# Patient Record
Sex: Male | Born: 1950 | Race: Black or African American | Hispanic: No | Smoking: Current every day smoker
Health system: Southern US, Community
[De-identification: ages and names within clinical notes are randomized; demographics above are authoritative.]

## PROBLEM LIST (undated history)

## (undated) DIAGNOSIS — K2270 Barrett's esophagus without dysplasia: Secondary | ICD-10-CM

## (undated) DIAGNOSIS — I714 Abdominal aortic aneurysm, without rupture, unspecified: Secondary | ICD-10-CM

## (undated) DIAGNOSIS — I739 Peripheral vascular disease, unspecified: Secondary | ICD-10-CM

## (undated) DIAGNOSIS — K259 Gastric ulcer, unspecified as acute or chronic, without hemorrhage or perforation: Secondary | ICD-10-CM

## (undated) HISTORY — DX: Barrett's esophagus without dysplasia: K22.70

## (undated) HISTORY — DX: Abdominal aortic aneurysm, without rupture, unspecified: I71.40

## (undated) HISTORY — DX: Gastric ulcer, unspecified as acute or chronic, without hemorrhage or perforation: K25.9

## (undated) HISTORY — DX: Peripheral vascular disease, unspecified: I73.9

---

## 1998-03-13 ENCOUNTER — Encounter: Payer: Self-pay | Admitting: Emergency Medicine

## 1998-03-13 ENCOUNTER — Emergency Department (HOSPITAL_COMMUNITY): Admission: EM | Admit: 1998-03-13 | Discharge: 1998-03-13 | Payer: Self-pay | Admitting: Emergency Medicine

## 1998-03-15 ENCOUNTER — Inpatient Hospital Stay (HOSPITAL_COMMUNITY): Admission: EM | Admit: 1998-03-15 | Discharge: 1998-03-20 | Payer: Self-pay | Admitting: Emergency Medicine

## 2000-08-23 ENCOUNTER — Inpatient Hospital Stay (HOSPITAL_COMMUNITY): Admission: EM | Admit: 2000-08-23 | Discharge: 2000-08-27 | Payer: Self-pay | Admitting: Emergency Medicine

## 2000-08-23 ENCOUNTER — Encounter: Payer: Self-pay | Admitting: Sports Medicine

## 2000-09-04 ENCOUNTER — Encounter: Admission: RE | Admit: 2000-09-04 | Discharge: 2000-09-04 | Payer: Self-pay | Admitting: Sports Medicine

## 2000-09-06 ENCOUNTER — Encounter: Payer: Self-pay | Admitting: Family Medicine

## 2000-09-07 ENCOUNTER — Inpatient Hospital Stay (HOSPITAL_COMMUNITY): Admission: EM | Admit: 2000-09-07 | Discharge: 2000-09-09 | Payer: Self-pay | Admitting: Emergency Medicine

## 2001-03-21 ENCOUNTER — Encounter: Payer: Self-pay | Admitting: Emergency Medicine

## 2001-03-21 ENCOUNTER — Emergency Department (HOSPITAL_COMMUNITY): Admission: EM | Admit: 2001-03-21 | Discharge: 2001-03-21 | Payer: Self-pay | Admitting: Emergency Medicine

## 2001-03-25 ENCOUNTER — Inpatient Hospital Stay (HOSPITAL_COMMUNITY): Admission: EM | Admit: 2001-03-25 | Discharge: 2001-03-29 | Payer: Self-pay | Admitting: Emergency Medicine

## 2001-05-06 ENCOUNTER — Emergency Department (HOSPITAL_COMMUNITY): Admission: EM | Admit: 2001-05-06 | Discharge: 2001-05-06 | Payer: Self-pay | Admitting: Emergency Medicine

## 2001-05-08 ENCOUNTER — Emergency Department (HOSPITAL_COMMUNITY): Admission: EM | Admit: 2001-05-08 | Discharge: 2001-05-08 | Payer: Self-pay | Admitting: Emergency Medicine

## 2020-08-08 ENCOUNTER — Other Ambulatory Visit: Payer: Self-pay

## 2020-08-08 ENCOUNTER — Emergency Department (HOSPITAL_COMMUNITY): Payer: Medicare Other

## 2020-08-08 ENCOUNTER — Inpatient Hospital Stay (HOSPITAL_COMMUNITY)
Admission: EM | Admit: 2020-08-08 | Discharge: 2020-08-10 | DRG: 728 | Disposition: A | Discharge status: Home / Self Care | Payer: Medicare Other | Attending: Internal Medicine | Admitting: Internal Medicine

## 2020-08-08 ENCOUNTER — Encounter (HOSPITAL_COMMUNITY): Payer: Self-pay | Admitting: Emergency Medicine

## 2020-08-08 DIAGNOSIS — I714 Abdominal aortic aneurysm, without rupture, unspecified: Secondary | ICD-10-CM | POA: Diagnosis present

## 2020-08-08 DIAGNOSIS — N492 Inflammatory disorders of scrotum: Secondary | ICD-10-CM | POA: Diagnosis present

## 2020-08-08 DIAGNOSIS — Z20822 Contact with and (suspected) exposure to covid-19: Secondary | ICD-10-CM | POA: Diagnosis present

## 2020-08-08 DIAGNOSIS — K56609 Unspecified intestinal obstruction, unspecified as to partial versus complete obstruction: Secondary | ICD-10-CM

## 2020-08-08 DIAGNOSIS — I723 Aneurysm of iliac artery: Secondary | ICD-10-CM | POA: Diagnosis present

## 2020-08-08 DIAGNOSIS — N5089 Other specified disorders of the male genital organs: Secondary | ICD-10-CM | POA: Diagnosis present

## 2020-08-08 DIAGNOSIS — K4030 Unilateral inguinal hernia, with obstruction, without gangrene, not specified as recurrent: Secondary | ICD-10-CM | POA: Diagnosis present

## 2020-08-08 DIAGNOSIS — K4090 Unilateral inguinal hernia, without obstruction or gangrene, not specified as recurrent: Secondary | ICD-10-CM

## 2020-08-08 DIAGNOSIS — R109 Unspecified abdominal pain: Secondary | ICD-10-CM

## 2020-08-08 LAB — PROTIME-INR
INR: 1.1 (ref 0.8–1.2)
Prothrombin Time: 14.1 seconds (ref 11.4–15.2)

## 2020-08-08 LAB — COMPREHENSIVE METABOLIC PANEL
ALT: 17 U/L (ref 0–44)
AST: 23 U/L (ref 15–41)
Albumin: 3.8 g/dL (ref 3.5–5.0)
Alkaline Phosphatase: 61 U/L (ref 38–126)
Anion gap: 6 (ref 5–15)
BUN: 8 mg/dL (ref 8–23)
CO2: 27 mmol/L (ref 22–32)
Calcium: 9.3 mg/dL (ref 8.9–10.3)
Chloride: 105 mmol/L (ref 98–111)
Creatinine, Ser: 1.04 mg/dL (ref 0.61–1.24)
GFR, Estimated: >60 mL/min (ref >60)
Glucose, Bld: 110 mg/dL — ABNORMAL HIGH (ref 70–99)
Potassium: 3.9 mmol/L (ref 3.5–5.1)
Sodium: 138 mmol/L (ref 135–145)
Total Bilirubin: 0.4 mg/dL (ref 0.3–1.2)
Total Protein: 7.3 g/dL (ref 6.5–8.1)

## 2020-08-08 LAB — CBC WITH DIFFERENTIAL/PLATELET
Abs Immature Granulocytes: 0.02 10*3/uL (ref 0.00–0.07)
Basophils Absolute: 0 10*3/uL (ref 0.0–0.1)
Basophils Relative: 0 %
Eosinophils Absolute: 0.1 10*3/uL (ref 0.0–0.5)
Eosinophils Relative: 2 %
HCT: 39 % (ref 39.0–52.0)
Hemoglobin: 13 g/dL (ref 13.0–17.0)
Immature Granulocytes: 0 %
Lymphocytes Relative: 16 %
Lymphs Abs: 1.2 10*3/uL (ref 0.7–4.0)
MCH: 32 pg (ref 26.0–34.0)
MCHC: 33.3 g/dL (ref 30.0–36.0)
MCV: 96.1 fL (ref 80.0–100.0)
Monocytes Absolute: 0.7 10*3/uL (ref 0.1–1.0)
Monocytes Relative: 10 %
Neutro Abs: 5.2 10*3/uL (ref 1.7–7.7)
Neutrophils Relative %: 72 %
Platelets: 199 10*3/uL (ref 150–400)
RBC: 4.06 MIL/uL — ABNORMAL LOW (ref 4.22–5.81)
RDW: 14.1 % (ref 11.5–15.5)
WBC: 7.2 10*3/uL (ref 4.0–10.5)
nRBC: 0 % (ref 0.0–0.2)

## 2020-08-08 LAB — URINALYSIS, ROUTINE W REFLEX MICROSCOPIC
Bilirubin Urine: NEGATIVE
Glucose, UA: NEGATIVE mg/dL
Hgb urine dipstick: NEGATIVE
Ketones, ur: 5 mg/dL — AB
Leukocytes,Ua: NEGATIVE
Nitrite: NEGATIVE
Protein, ur: 100 mg/dL — AB
Specific Gravity, Urine: 1.025 (ref 1.005–1.030)
pH: 5 (ref 5.0–8.0)

## 2020-08-08 LAB — LACTIC ACID, PLASMA: Lactic Acid, Venous: 1.1 mmol/L (ref 0.5–1.9)

## 2020-08-08 LAB — APTT: aPTT: 29 seconds (ref 24–36)

## 2020-08-08 LAB — BRAIN NATRIURETIC PEPTIDE: B Natriuretic Peptide: 28.7 pg/mL (ref 0.0–100.0)

## 2020-08-08 MED ORDER — VANCOMYCIN HCL 1250 MG/250ML IV SOLN
1250.0000 mg | Freq: Once | INTRAVENOUS | Status: AC
  Administered 2020-08-08: 1250 mg via INTRAVENOUS
  Filled 2020-08-08: qty 250

## 2020-08-08 MED ORDER — ONDANSETRON HCL 4 MG PO TABS: 4.0000 mg | ORAL_TABLET | Freq: Four times a day (QID) | ORAL | Status: DC | PRN

## 2020-08-08 MED ORDER — MORPHINE SULFATE (PF) 2 MG/ML IV SOLN
2.0000 mg | INTRAVENOUS | Status: DC | PRN
  Administered 2020-08-08 – 2020-08-10 (×2): 2 mg via INTRAVENOUS
  Filled 2020-08-08 (×2): qty 1

## 2020-08-08 MED ORDER — ENOXAPARIN SODIUM 40 MG/0.4ML IJ SOSY
40.0000 mg | PREFILLED_SYRINGE | Freq: Every day | INTRAMUSCULAR | Status: DC
  Administered 2020-08-09 – 2020-08-10 (×2): 40 mg via SUBCUTANEOUS
  Filled 2020-08-08 (×2): qty 0.4

## 2020-08-08 MED ORDER — ONDANSETRON HCL 4 MG/2ML IJ SOLN
4.0000 mg | Freq: Four times a day (QID) | INTRAMUSCULAR | Status: DC | PRN
  Administered 2020-08-08: 4 mg via INTRAVENOUS
  Filled 2020-08-08: qty 2

## 2020-08-08 MED ORDER — PIPERACILLIN-TAZOBACTAM 3.375 G IVPB 30 MIN
3.3750 g | Freq: Once | INTRAVENOUS | Status: AC
  Administered 2020-08-08: 3.375 g via INTRAVENOUS
  Filled 2020-08-08: qty 50

## 2020-08-08 MED ORDER — IOHEXOL 300 MG/ML  SOLN
100.0000 mL | Freq: Once | INTRAMUSCULAR | Status: AC | PRN
  Administered 2020-08-08: 100 mL via INTRAVENOUS

## 2020-08-08 NOTE — Progress Notes (Signed)
Pharmacy Antibiotic Note  Greg [name] is a 70 y.o. male admitted on 08/08/2020 with scrotal cellulitis.  Pharmacy has been consulted for Vancomycin and Cefepime  dosing.  Vancomycin 1250 mg IV given in ED at  2030  Plan: Vancomycin 750 mg IV q12h Cefepime 2 g IV q12h  Height: 5\' 8"  (172.7 cm) Weight: 62.5 kg (137 lb 12.6 oz) IBW/kg (Calculated) : 68.4  Temp (24hrs), Avg:98.7 F (37.1 C), Min:97.7 F (36.5 C), Max:99.2 F (37.3 C)  Recent Labs  Lab 08/08/20 1830 08/08/20 2016 08/08/20 2314  WBC 7.2  --   --   CREATININE 1.04  --   --   LATICACIDVEN  --  1.1 1.2    Estimated Creatinine Clearance: 59.3 mL/min (by C-G formula based on SCr of 1.04 mg/dL).    No Known Allergies   Greg [name] 08/09/2020 12:52 AM

## 2020-08-08 NOTE — ED Triage Notes (Addendum)
Pt to triage via [facility] from home.  Reports severe scrotal swelling and pain x 3-4 weeks.  Reports bleeding to scrotum today.  Denies hematuria.

## 2020-08-08 NOTE — H&P (Signed)
History and Physical   Greg [name] IFO:[id number] DOB: 04/18/1950 DOA: 08/08/2020  Referring MD/NP/PA: Dr. Vanita Panda  PCP: Pcp, No   Outpatient Specialists: None  Patient coming from: Home  Chief Complaint: Scrotal swelling and pain  HPI: Greg [name] is a 70 y.o. male with medical history significant of no significant past medical history who presented with pain and swelling of his right scrotal area.  Patient is a poor historian and keeps changing his story along the line.  He reported having a similar swelling about 3 to 4 years ago which was reduced in the hospital not sure where.  He also reported that the current swelling started 3 weeks ago and has been progressive.  It started bleeding in the last few days.  He appears to have a deep cut also on the right scrotal wall which is oozing blood.  There is also surrounding cellulitis of the scrotum.  CT showed loops of small and large bowel in the scrotal sac consistent with incarcerated inguinal hernia.  Surgery consulted and we have been asked admit the patient for the cellulitis of the scrotal area with open wound infection.  ED Course: Vitals and telemetry stable with temperature of 99.2.  His blood pressure is 144/98.  Urinalysis essentially negative.  CBC and chemistry mostly within normal.  He does have a glucose of 110.  CT abdomen pelvis showed large right inguinal hernia containing multiple small bowel loops in the right colon.  There are mildly prominent fluid-filled small bowel loops in the lower abdomen and pelvis possibly low-grade partial small bowel obstruction.  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  He is being admitted with cellulitis of the right scrotum and wound infection  Review of Systems: As per HPI otherwise 10 point review of systems negative.    History reviewed. No pertinent past medical history.  History reviewed. No pertinent  surgical history.   reports that he has never smoked. He has never used smokeless tobacco. He reports previous alcohol use. He reports previous drug use.  Not on File  No family history on file.   Prior to Admission medications   Not on File    Physical Exam: Vitals:   08/08/20 2045 08/08/20 2115 08/08/20 2130 08/08/20 2235  BP: 136/89 (!) 138/103 (!) 135/97 (!) 136/93  Pulse: 81 80 82 83  Resp: 20 16 18 19   Temp:      SpO2: 100% 99% 99% 99%      Constitutional: Disheveled, chronically ill looking, confused Vitals:   08/08/20 2045 08/08/20 2115 08/08/20 2130 08/08/20 2235  BP: 136/89 (!) 138/103 (!) 135/97 (!) 136/93  Pulse: 81 80 82 83  Resp: 20 16 18 19   Temp:      SpO2: 100% 99% 99% 99%   Eyes: PERRL, lids and conjunctivae normal ENMT: Mucous membranes are moist. Posterior pharynx clear of any exudate or lesions.Normal dentition.  Neck: normal, supple, no masses, no thyromegaly Respiratory: clear to auscultation bilaterally, no wheezing, no crackles. Normal respiratory effort. No accessory muscle use.  Cardiovascular: Regular rate and rhythm, no murmurs / rubs / gallops. No extremity edema. 2+ pedal pulses. No carotid bruits.  Abdomen: no tenderness, no masses palpated. No hepatosplenomegaly. Bowel sounds positive.  Musculoskeletal: no clubbing / cyanosis. No joint deformity upper and lower extremities. Good ROM, no contractures. Normal muscle tone.  GU: Markedly swollen right scrotal area with surrounding cellulitis and a deep 3 cm  laceration oozing blood Skin: Evidence of scrotal cellulitis and open wound Neurologic: CN 2-12 grossly intact. Sensation intact, DTR normal. Strength 5/5 in all 4.  Psychiatric: Confused and disoriented in time   Labs on Admission: I have personally reviewed following labs and imaging studies  CBC: Recent Labs  Lab 08/08/20 1830  WBC 7.2  NEUTROABS 5.2  HGB 13.0  HCT 39.0  MCV 96.1  PLT 144   Basic Metabolic Panel: Recent  Labs  Lab 08/08/20 1830  NA 138  K 3.9  CL 105  CO2 27  GLUCOSE 110*  BUN 8  CREATININE 1.04  CALCIUM 9.3   GFR: CrCl cannot be calculated (Unknown ideal weight.). Liver Function Tests: Recent Labs  Lab 08/08/20 1830  AST 23  ALT 17  ALKPHOS 61  BILITOT 0.4  PROT 7.3  ALBUMIN 3.8   No results for input(s): LIPASE, AMYLASE in the last 168 hours. No results for input(s): AMMONIA in the last 168 hours. Coagulation Profile: Recent Labs  Lab 08/08/20 1830  INR 1.1   Cardiac Enzymes: No results for input(s): CKTOTAL, CKMB, CKMBINDEX, TROPONINI in the last 168 hours. BNP (last 3 results) No results for input(s): PROBNP in the last 8760 hours. HbA1C: No results for input(s): HGBA1C in the last 72 hours. CBG: No results for input(s): GLUCAP in the last 168 hours. Lipid Profile: No results for input(s): CHOL, HDL, LDLCALC, TRIG, CHOLHDL, LDLDIRECT in the last 72 hours. Thyroid Function Tests: No results for input(s): TSH, T4TOTAL, FREET4, T3FREE, THYROIDAB in the last 72 hours. Anemia Panel: No results for input(s): VITAMINB12, FOLATE, FERRITIN, TIBC, IRON, RETICCTPCT in the last 72 hours. Urine analysis:    Component Value Date/Time   COLORURINE YELLOW 08/08/2020 2033   APPEARANCEUR HAZY (A) 08/08/2020 2033   LABSPEC 1.025 08/08/2020 2033   PHURINE 5.0 08/08/2020 2033   GLUCOSEU NEGATIVE 08/08/2020 2033   HGBUR NEGATIVE 08/08/2020 2033   BILIRUBINUR NEGATIVE 08/08/2020 2033   KETONESUR 5 (A) 08/08/2020 2033   PROTEINUR 100 (A) 08/08/2020 2033   NITRITE NEGATIVE 08/08/2020 2033   LEUKOCYTESUR NEGATIVE 08/08/2020 2033   Sepsis Labs: @LABRCNTIP (procalcitonin:4,lacticidven:4) )No results found for this or any previous visit (from the past 240 hour(s)).   Radiological Exams on Admission: CT PELVIS W CONTRAST  Result Date: 08/08/2020 CLINICAL DATA:  Anal/rectal abscess. Concern for Fournier's gangrene. EXAM: CT PELVIS WITH CONTRAST TECHNIQUE: Multidetector CT  imaging of the pelvis was performed using the standard protocol following the bolus administration of intravenous contrast. CONTRAST:  142mL OMNIPAQUE IOHEXOL 300 MG/ML  SOLN COMPARISON:  None. FINDINGS: Urinary Tract:  No abnormality visualized. Bowel: Several small bowel loops as well as the right colon is located within a large right inguinal hernia. There are fluid-filled small bowel loops in the right abdomen. Cannot exclude early small bowel obstruction. Vascular/Lymphatic: Aneurysmal dilatation of the infrarenal aorta measuring up to 3.6 cm with extensive mural plaque. Aneurysmal dilatation of the right common iliac artery measuring 2.5 cm with extensive mural plaque. No adenopathy. Reproductive:  Mild prostate enlargement. Other:  No free fluid or free air. Musculoskeletal: No acute bony abnormality. No evidence of soft tissue abscess or gas. IMPRESSION: Large right inguinal hernia containing multiple small bowel loops and the right colon. There are mildly prominent fluid-filled small bowel loops in the lower abdomen and pelvis. Cannot exclude early low grade partial small bowel obstruction. No evidence of soft tissue abscess or soft tissue gas. 3.6 cm abdominal aortic aneurysm. 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  Recommend follow-up ultrasound every 2 years. This recommendation follows ACR consensus guidelines: White Paper of the ACR Incidental Findings Committee II on Vascular Findings. J Am Coll Radiol 2013; 10:789-794. Electronically Signed   By: Rolm Baptise M.D.   On: 08/08/2020 22:21   DG Chest Port 1 View  Result Date: 08/08/2020 CLINICAL DATA:  Questionable sepsis EXAM: PORTABLE CHEST 1 VIEW COMPARISON:  None. FINDINGS: The heart size and mediastinal contours are within normal limits. Both lungs are clear. The visualized skeletal structures are unremarkable. IMPRESSION: No active disease. Electronically Signed   By: Rolm Baptise M.D.   On: 08/08/2020 19:02      Assessment/Plan Principal Problem:   Cellulitis of scrotum Active Problems:   Right inguinal hernia   AAA (abdominal aortic aneurysm) (HCC)     #1 cellulitis of the right scrotum: Patient also has an ulceration on the side of the right scrotum with wound infection.  Patient will be admitted for IV antibiotics.  Initiate vancomycin and cefepime.  Elevate the scrotum.  Monitor his response.  We will order wound care May require urology consult at some point if needed.  Surgery following.  #2 chronically incarcerated right inguinal hernia: Surgery is consulting.  No immediate need for surgical intervention.  Will monitor closely on follow-up.  Per surgery no immediate concern for surgery.  Surgery will follow.  #3 abdominal aortic aneurysm: Incidental finding    DVT prophylaxis: Lovenox Code Status: Full code Family Communication: No family at bedside Disposition Plan: Home Consults called: Dr. Bobbye Morton, general surgery Admission status: Inpatient  Severity of Illness: The appropriate patient status for this patient is INPATIENT. Inpatient status is judged to be reasonable and necessary in order to provide the required intensity of service to ensure the patient's safety. The patient's presenting symptoms, physical exam findings, and initial radiographic and laboratory data in the context of their chronic comorbidities is felt to place them at high risk for further clinical deterioration. Furthermore, it is not anticipated that the patient will be medically stable for discharge from the hospital within 2 midnights of admission. The following factors support the patient status of inpatient.   " The patient's presenting symptoms include right scrotal swelling. " The worrisome physical exam findings include bleeding markedly swollen right scrotum. " The initial radiographic and laboratory data are worrisome because of CT findings of incarcerated inguinal hernia on the right. " The chronic  co-morbidities include none.   * I certify that at the point of admission it is my clinical judgment that the patient will require inpatient hospital care spanning beyond 2 midnights from the point of admission due to high intensity of service, high risk for further deterioration and high frequency of surveillance required.Barbette Merino MD [facility] Pager [phone]  If 7PM-7AM, please contact night-coverage [email or web address] Password [email or web address]  08/08/2020, 11:13 PM

## 2020-08-08 NOTE — ED Provider Notes (Signed)
[place] EMERGENCY DEPARTMENT Provider Note   CSN: [id number] Arrival date & time: 08/08/20  1728     History No chief complaint on file.   Greg [name] is a 70 y.o. male.  HPI Patient presents with concern for scrotal swelling and bleeding.  The patient is a poor historian, but states that he was well until about 3 weeks ago when he noticed pain and swelling in his right hemiscrotum.  About 2 days ago he noticed blood from the same area, it is unclear why he waited until today for evaluation.  He denies vomiting, denies diarrhea, denies fever.  He has, as above, I poor historian, offering details slowly in terms of his HPI.  He does state that he is generally well, has no prior similar episodes.   History reviewed. No pertinent past medical history.  There are no problems to display for this patient.   History reviewed. No pertinent surgical history.     No family history on file.  Social History   Tobacco Use   Smoking status: Never   Smokeless tobacco: Never  Substance Use Topics   Alcohol use: Not Currently   Drug use: Not Currently    Home Medications Prior to Admission medications   Not on File    Allergies    Patient has no allergy information on record.  Review of Systems   Review of Systems  Constitutional:        Per HPI, otherwise negative  HENT:         Per HPI, otherwise negative  Respiratory:         Per HPI, otherwise negative  Cardiovascular:        Per HPI, otherwise negative  Gastrointestinal:  Negative for abdominal pain, nausea and vomiting.  Endocrine:       Negative aside from HPI  Genitourinary:        Neg aside from HPI   Musculoskeletal:        Per HPI, otherwise negative  Skin:  Positive for wound.  Neurological:  Negative for syncope.   Physical Exam Updated Vital Signs BP (!) 136/93   Pulse 83   Temp 99.2 F (37.3 C)   Resp 19   SpO2 99%   Physical Exam Vitals and nursing note reviewed.   Constitutional:      General: He is not in acute distress.    Appearance: He is well-developed.  HENT:     Head: Normocephalic and atraumatic.  Eyes:     Conjunctiva/sclera: Conjunctivae normal.  Cardiovascular:     Rate and Rhythm: Normal rate and regular rhythm.  Pulmonary:     Effort: Pulmonary effort is normal. No respiratory distress.     Breath sounds: No stridor.  Abdominal:     General: There is no distension.  Genitourinary:    Comments: Genitals unremarkable aside from right hemiscrotum see accompanying picture for full details.  In essence hemiscrotum is tender, with erythema on the lateral wall 2 areas of erosion with minor bleeding. Skin:    General: Skin is warm and dry.  Neurological:     Mental Status: He is alert and oriented to person, place, and time.    ED Results / Procedures / Treatments   Labs (all labs ordered are listed, but only abnormal results are displayed) Labs Reviewed  COMPREHENSIVE METABOLIC PANEL - Abnormal; Notable for the following components:      Result Value   Glucose, Bld 110 (*)  All other components within normal limits  CBC WITH DIFFERENTIAL/PLATELET - Abnormal; Notable for the following components:   RBC 4.06 (*)    All other components within normal limits  URINALYSIS, ROUTINE W REFLEX MICROSCOPIC - Abnormal; Notable for the following components:   APPearance HAZY (*)    Ketones, ur 5 (*)    Protein, ur 100 (*)    Bacteria, UA RARE (*)    All other components within normal limits  CULTURE, BLOOD (SINGLE)  URINE CULTURE  CULTURE, BLOOD (ROUTINE X 2)  CULTURE, BLOOD (ROUTINE X 2)  SARS CORONAVIRUS 2 (TAT 6-24 HRS)  LACTIC ACID, PLASMA  PROTIME-INR  APTT  BRAIN NATRIURETIC PEPTIDE  LACTIC ACID, PLASMA    EKG EKG Interpretation  Date/Time:  Tuesday August 08 2020 19:17:40 EDT Ventricular Rate:  81 PR Interval:  137 QRS Duration: 86 QT Interval:  352 QTC Calculation: 409 R Axis:   77 Text Interpretation: Sinus  rhythm Probable left atrial enlargement Anteroseptal infarct, old Abnormal ECG Confirmed by Carmin Muskrat [phone]) on 08/08/2020 9:11:39 PM  Radiology CT PELVIS W CONTRAST  Result Date: 08/08/2020 CLINICAL DATA:  Anal/rectal abscess. Concern for Fournier's gangrene. EXAM: CT PELVIS WITH CONTRAST TECHNIQUE: Multidetector CT imaging of the pelvis was performed using the standard protocol following the bolus administration of intravenous contrast. CONTRAST:  151mL OMNIPAQUE IOHEXOL 300 MG/ML  SOLN COMPARISON:  None. FINDINGS: Urinary Tract:  No abnormality visualized. Bowel: Several small bowel loops as well as the right colon is located within a large right inguinal hernia. There are fluid-filled small bowel loops in the right abdomen. Cannot exclude early small bowel obstruction. Vascular/Lymphatic: Aneurysmal dilatation of the infrarenal aorta measuring up to 3.6 cm with extensive mural plaque. Aneurysmal dilatation of the right common iliac artery measuring 2.5 cm with extensive mural plaque. No adenopathy. Reproductive:  Mild prostate enlargement. Other:  No free fluid or free air. Musculoskeletal: No acute bony abnormality. No evidence of soft tissue abscess or gas. IMPRESSION: Large right inguinal hernia containing multiple small bowel loops and the right colon. There are mildly prominent fluid-filled small bowel loops in the lower abdomen and pelvis. Cannot exclude early low grade partial small bowel obstruction. No evidence of soft tissue abscess or soft tissue gas. 3.6 cm abdominal aortic aneurysm. 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 Recommend follow-up ultrasound every 2 years. This recommendation follows ACR consensus guidelines: White Paper of the ACR Incidental Findings Committee II on Vascular Findings. J Am Coll Radiol 2013; 10:789-794. Electronically Signed   By: Rolm Baptise M.D.   On: 08/08/2020 22:21   DG Chest Port 1 View  Result Date: 08/08/2020 CLINICAL DATA:  Questionable sepsis  EXAM: PORTABLE CHEST 1 VIEW COMPARISON:  None. FINDINGS: The heart size and mediastinal contours are within normal limits. Both lungs are clear. The visualized skeletal structures are unremarkable. IMPRESSION: No active disease. Electronically Signed   By: Rolm Baptise M.D.   On: 08/08/2020 19:02    Procedures Procedures   Medications Ordered in ED Medications  vancomycin (VANCOREADY) IVPB 1250 mg/250 mL (0 mg Intravenous Stopped 08/08/20 2236)  piperacillin-tazobactam (ZOSYN) IVPB 3.375 g (0 g Intravenous Stopped 08/08/20 2037)  iohexol (OMNIPAQUE) 300 MG/ML solution 100 mL (100 mLs Intravenous Contrast Given 08/08/20 2201)    ED Course  I have reviewed the triage vital signs and the nursing notes.  Pertinent labs & imaging results that were available during my care of the patient were reviewed by me and considered in my medical decision  making (see chart for details).  With concern for fever, gross evidence for possible scrotal wall cellulitis patient received broad-spectrum antibiotics on arrival.  Update: Patient remains in similar condition, no distress.  10:51 PM Patient's CT scan reviewed, notable for demonstration of inguinal hernia with presence of large and small bowel in the right hemiscrotum, no deep space infection adjacent to his superficial scrotal wall lesions, which now are likely some incidental findings given the CT concerning for inguinal hernia, possible early bowel obstruction.  On repeat exam patient is aware of all findings, denies vomiting, again, but remains a withdrawn historian. I discussed his abnormal CT findings with our surgery team and they will follow as a consulting service.  Given concern for inguinal hernia, scrotal wall cellulitis, possible early bowel obstruction he will be admitted to the medicine team for monitoring, management.  After hours of monitoring patient's blood pressure has remained unremarkable, he is not tachycardic, initial concerns for sepsis  not substantiated with follow-up labs. MDM Rules/Calculators/A&P MDM Number of Diagnoses or Management Options Cellulitis of scrotum: new, needed workup SBO (small bowel obstruction) ([place]): new, needed workup Unilateral inguinal hernia with obstruction and without gangrene, recurrence not specified: new, needed workup   Amount and/or Complexity of Data Reviewed Clinical lab tests: ordered and reviewed Tests in the radiology section of CPT: ordered and reviewed Tests in the medicine section of CPT: reviewed and ordered Decide to obtain previous medical records or to obtain history from someone other than the patient: yes Review and summarize past medical records: yes Discuss the patient with other providers: yes Independent visualization of images, tracings, or specimens: yes  Risk of Complications, Morbidity, and/or Mortality Presenting problems: high Diagnostic procedures: high Management options: high  Critical Care Total time providing critical care: 30-74 minutes (35)  Patient Progress Patient progress: stable   Final Clinical Impression(s) / ED Diagnoses Final diagnoses:  SBO (small bowel obstruction) ([place])  Unilateral inguinal hernia with obstruction and without gangrene, recurrence not specified  Cellulitis of scrotum     Carmin Muskrat, MD 08/08/20 2256

## 2020-08-08 NOTE — ED Provider Notes (Signed)
Emergency Medicine Provider Triage Evaluation Note  Greg [name] , a 70 y.o. male  was evaluated in triage.  Pt complains of testicular swelling that started 3 weeks ago. Has had drainage and bleeding. Reports fevers at home.  Review of Systems  Positive: Testicular pain/swelling, fever Negative: vomiting  Physical Exam  BP 128/90 (BP Location: Right Arm)   Pulse 83   Temp 99.2 F (37.3 C)   Resp 18   SpO2 100%  Gen:   Awake, no distress   Resp:  Normal effort  MSK:   Moves extremities without difficulty  Other:  Significantly swollen right testicle (~12 inches x6 inches), two open wounds noted with some bleeding. No crepitus on exam  Medical Decision Making  Medically screening exam initiated at 6:27 PM.  Appropriate orders placed.  Savva Beamer was informed that the remainder of the evaluation will be completed by another provider, this initial triage assessment does not replace that evaluation, and the importance of remaining in the ED until their evaluation is complete.  Pt to be prioritized for a room. Sepsis w/u ordered and ct imaging ordered.   Bishop Dublin 08/08/20 1828    Carmin Muskrat, MD 08/08/20 2249

## 2020-08-09 ENCOUNTER — Inpatient Hospital Stay (HOSPITAL_COMMUNITY): Payer: Medicare Other

## 2020-08-09 DIAGNOSIS — N492 Inflammatory disorders of scrotum: Principal | ICD-10-CM

## 2020-08-09 LAB — CBC
HCT: 35.7 % — ABNORMAL LOW (ref 39.0–52.0)
Hemoglobin: 12 g/dL — ABNORMAL LOW (ref 13.0–17.0)
MCH: 31.8 pg (ref 26.0–34.0)
MCHC: 33.6 g/dL (ref 30.0–36.0)
MCV: 94.7 fL (ref 80.0–100.0)
Platelets: 187 10*3/uL (ref 150–400)
RBC: 3.77 MIL/uL — ABNORMAL LOW (ref 4.22–5.81)
RDW: 14.1 % (ref 11.5–15.5)
WBC: 7 10*3/uL (ref 4.0–10.5)
nRBC: 0 % (ref 0.0–0.2)

## 2020-08-09 LAB — COMPREHENSIVE METABOLIC PANEL
ALT: 15 U/L (ref 0–44)
AST: 19 U/L (ref 15–41)
Albumin: 3.3 g/dL — ABNORMAL LOW (ref 3.5–5.0)
Alkaline Phosphatase: 58 U/L (ref 38–126)
Anion gap: 6 (ref 5–15)
BUN: <5 mg/dL — ABNORMAL LOW (ref 8–23)
CO2: 24 mmol/L (ref 22–32)
Calcium: 8.8 mg/dL — ABNORMAL LOW (ref 8.9–10.3)
Chloride: 108 mmol/L (ref 98–111)
Creatinine, Ser: 1.07 mg/dL (ref 0.61–1.24)
GFR, Estimated: >60 mL/min (ref >60)
Glucose, Bld: 91 mg/dL (ref 70–99)
Potassium: 3.6 mmol/L (ref 3.5–5.1)
Sodium: 138 mmol/L (ref 135–145)
Total Bilirubin: 0.6 mg/dL (ref 0.3–1.2)
Total Protein: 6.7 g/dL (ref 6.5–8.1)

## 2020-08-09 LAB — LACTIC ACID, PLASMA: Lactic Acid, Venous: 1.2 mmol/L (ref 0.5–1.9)

## 2020-08-09 LAB — SARS CORONAVIRUS 2 (TAT 6-24 HRS): SARS Coronavirus 2: NEGATIVE

## 2020-08-09 LAB — HIV ANTIBODY (ROUTINE TESTING W REFLEX): HIV Screen 4th Generation wRfx: NONREACTIVE

## 2020-08-09 MED ORDER — MUPIROCIN 2 % EX OINT
TOPICAL_OINTMENT | Freq: Every day | CUTANEOUS | Status: DC
  Filled 2020-08-09: qty 22

## 2020-08-09 MED ORDER — POLYETHYLENE GLYCOL 3350 17 G PO PACK
17.0000 g | PACK | Freq: Every day | ORAL | Status: DC
  Administered 2020-08-09 – 2020-08-10 (×2): 17 g via ORAL
  Filled 2020-08-09 (×2): qty 1

## 2020-08-09 MED ORDER — VANCOMYCIN HCL 750 MG/150ML IV SOLN
750.0000 mg | Freq: Two times a day (BID) | INTRAVENOUS | Status: DC
  Filled 2020-08-09: qty 150

## 2020-08-09 MED ORDER — LACTATED RINGERS IV SOLN: INTRAVENOUS | Status: DC

## 2020-08-09 MED ORDER — SODIUM CHLORIDE 0.9 % IV SOLN
2.0000 g | Freq: Two times a day (BID) | INTRAVENOUS | Status: DC
  Administered 2020-08-09 – 2020-08-10 (×4): 2 g via INTRAVENOUS
  Filled 2020-08-09 (×4): qty 2

## 2020-08-09 MED ORDER — METOPROLOL TARTRATE 5 MG/5ML IV SOLN: 5.0000 mg | Freq: Four times a day (QID) | INTRAVENOUS | Status: DC | PRN

## 2020-08-09 MED ORDER — VANCOMYCIN HCL IN DEXTROSE 750-5 MG/150ML-% IV SOLN
750.0000 mg | Freq: Two times a day (BID) | INTRAVENOUS | Status: DC
  Administered 2020-08-09 – 2020-08-10 (×2): 750 mg via INTRAVENOUS
  Filled 2020-08-09 (×4): qty 150

## 2020-08-09 MED ORDER — SENNOSIDES-DOCUSATE SODIUM 8.6-50 MG PO TABS
1.0000 | ORAL_TABLET | Freq: Two times a day (BID) | ORAL | Status: DC
  Administered 2020-08-09 – 2020-08-10 (×2): 1 via ORAL
  Filled 2020-08-09 (×2): qty 1

## 2020-08-09 MED ORDER — ACETAMINOPHEN 325 MG PO TABS
650.0000 mg | ORAL_TABLET | Freq: Four times a day (QID) | ORAL | Status: DC | PRN
  Administered 2020-08-09: 650 mg via ORAL
  Filled 2020-08-09: qty 2

## 2020-08-09 NOTE — Consult Note (Signed)
Reason for Consult/Chief Complaint:inguinal hernia Consultant: Vanita Panda, MD  Macalister Arnaud is an 70 y.o. male.   HPI: 37M presents with bleeding from R scrotum. History is unreliable due to tangential conversation and inconsistent responses. Patient responses regarding length of hernia presence range from "today" to "three weeks" to "three or four years". He endorses nausea and vomiting, but when pressed regarding the last episode of emesis, he states "three months ago".   History reviewed. No pertinent past medical history.  History reviewed. No pertinent surgical history.  No family history on file.  Social History:  reports that he has never smoked. He has never used smokeless tobacco. He reports previous alcohol use. He reports previous drug use.  Allergies: No Known Allergies  Medications: I have reviewed the patient's current medications.  Results for orders placed or performed during the hospital encounter of 08/08/20 (from the past 48 hour(s))  Comprehensive metabolic panel     Status: Abnormal   Collection Time: 08/08/20  6:30 PM  Result Value Ref Range   Sodium 138 135 - 145 mmol/L   Potassium 3.9 3.5 - 5.1 mmol/L   Chloride 105 98 - 111 mmol/L   CO2 27 22 - 32 mmol/L   Glucose, Bld 110 (H) 70 - 99 mg/dL    Comment: Glucose reference range applies only to samples taken after fasting for at least 8 hours.   BUN 8 8 - 23 mg/dL   Creatinine, Ser 1.04 0.61 - 1.24 mg/dL   Calcium 9.3 8.9 - 10.3 mg/dL   Total Protein 7.3 6.5 - 8.1 g/dL   Albumin 3.8 3.5 - 5.0 g/dL   AST 23 15 - 41 U/L   ALT 17 0 - 44 U/L   Alkaline Phosphatase 61 38 - 126 U/L   Total Bilirubin 0.4 0.3 - 1.2 mg/dL   GFR, Estimated >60 >60 mL/min    Comment: (NOTE) Calculated using the CKD-EPI Creatinine Equation (2021)    Anion gap 6 5 - 15    Comment: Performed at [facility] [address] [place], [place] [address]  CBC WITH DIFFERENTIAL     Status: Abnormal   Collection Time:  08/08/20  6:30 PM  Result Value Ref Range   WBC 7.2 4.0 - 10.5 K/uL   RBC 4.06 (L) 4.22 - 5.81 MIL/uL   Hemoglobin 13.0 13.0 - 17.0 g/dL   HCT 39.0 39.0 - 52.0 %   MCV 96.1 80.0 - 100.0 fL   MCH 32.0 26.0 - 34.0 pg   MCHC 33.3 30.0 - 36.0 g/dL   RDW 14.1 11.5 - 15.5 %   Platelets 199 150 - 400 K/uL   nRBC 0.0 0.0 - 0.2 %   Neutrophils Relative % 72 %   Neutro Abs 5.2 1.7 - 7.7 K/uL   Lymphocytes Relative 16 %   Lymphs Abs 1.2 0.7 - 4.0 K/uL   Monocytes Relative 10 %   Monocytes Absolute 0.7 0.1 - 1.0 K/uL   Eosinophils Relative 2 %   Eosinophils Absolute 0.1 0.0 - 0.5 K/uL   Basophils Relative 0 %   Basophils Absolute 0.0 0.0 - 0.1 K/uL   Immature Granulocytes 0 %   Abs Immature Granulocytes 0.02 0.00 - 0.07 K/uL    Comment: Performed at [facility] [address], [place],  [address]  Protime-INR     Status: None   Collection Time: 08/08/20  6:30 PM  Result Value Ref Range   Prothrombin Time 14.1  11.4 - 15.2 seconds   INR 1.1 0.8 - 1.2    Comment: (NOTE) INR goal varies based on device and disease states. Performed at [facility], [place] [address], [place], [place] [address]   APTT     Status: None   Collection Time: 08/08/20  6:30 PM  Result Value Ref Range   aPTT 29 24 - 36 seconds    Comment: Performed at [facility] [address], [place], [place] [address]  Brain natriuretic peptide     Status: None   Collection Time: 08/08/20  6:42 PM  Result Value Ref Range   B Natriuretic Peptide 28.7 0.0 - 100.0 pg/mL    Comment: Performed at [facility] [address], [place], [place] [address]  Lactic acid, plasma     Status: None   Collection Time: 08/08/20  8:16 PM  Result Value Ref Range   Lactic Acid, Venous 1.1 0.5 - 1.9 mmol/L    Comment: Performed at [facility] [address] [place], [place] [address]  Urinalysis, Routine w reflex microscopic Urine, Random     Status: Abnormal   Collection Time: 08/08/20  8:33 PM   Result Value Ref Range   Color, Urine YELLOW YELLOW   APPearance HAZY (A) CLEAR   Specific Gravity, Urine 1.025 1.005 - 1.030   pH 5.0 5.0 - 8.0   Glucose, UA NEGATIVE NEGATIVE mg/dL   Hgb urine dipstick NEGATIVE NEGATIVE   Bilirubin Urine NEGATIVE NEGATIVE   Ketones, ur 5 (A) NEGATIVE mg/dL   Protein, ur 100 (A) NEGATIVE mg/dL   Nitrite NEGATIVE NEGATIVE   Leukocytes,Ua NEGATIVE NEGATIVE   RBC / HPF 6-10 0 - 5 RBC/hpf   WBC, UA 0-5 0 - 5 WBC/hpf   Bacteria, UA RARE (A) NONE SEEN   Squamous Epithelial / LPF 0-5 0 - 5   Mucus PRESENT    Hyaline Casts, UA PRESENT     Comment: Performed at [facility], [place] [address] [place], [place] [address]  Lactic acid, plasma     Status: None   Collection Time: 08/08/20 11:14 PM  Result Value Ref Range   Lactic Acid, Venous 1.2 0.5 - 1.9 mmol/L    Comment: Performed at [facility] [address], [place], [place] [address]    CT PELVIS W CONTRAST  Result Date: 08/08/2020 CLINICAL DATA:  Anal/rectal abscess. Concern for Fournier's gangrene. EXAM: CT PELVIS WITH CONTRAST TECHNIQUE: Multidetector CT imaging of the pelvis was performed using the standard protocol following the bolus administration of intravenous contrast. CONTRAST:  176mL OMNIPAQUE IOHEXOL 300 MG/ML  SOLN COMPARISON:  None. FINDINGS: Urinary Tract:  No abnormality visualized. Bowel: Several small bowel loops as well as the right colon is located within a large right inguinal hernia. There are fluid-filled small bowel loops in the right abdomen. Cannot exclude early small bowel obstruction. Vascular/Lymphatic: Aneurysmal dilatation of the infrarenal aorta measuring up to 3.6 cm with extensive mural plaque. Aneurysmal dilatation of the right common iliac artery measuring 2.5 cm with extensive mural plaque. No adenopathy. Reproductive:  Mild prostate enlargement. Other:  No free fluid or free air. Musculoskeletal: No acute bony abnormality. No evidence of soft tissue abscess  or gas. IMPRESSION: Large right inguinal hernia containing multiple small bowel loops and the right colon. There are mildly prominent fluid-filled small bowel loops in the lower abdomen and pelvis. Cannot exclude early low grade partial small bowel obstruction. No evidence  of soft tissue abscess or soft tissue gas. 3.6 cm abdominal aortic aneurysm. 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 Recommend follow-up ultrasound every 2 years. This recommendation follows ACR consensus guidelines: White Paper of the ACR Incidental Findings Committee II on Vascular Findings. J Am Coll Radiol 2013; 10:789-794. Electronically Signed   By: Rolm Baptise M.D.   On: 08/08/2020 22:21   DG Chest Port 1 View  Result Date: 08/08/2020 CLINICAL DATA:  Questionable sepsis EXAM: PORTABLE CHEST 1 VIEW COMPARISON:  None. FINDINGS: The heart size and mediastinal contours are within normal limits. Both lungs are clear. The visualized skeletal structures are unremarkable. IMPRESSION: No active disease. Electronically Signed   By: Rolm Baptise M.D.   On: 08/08/2020 19:02    ROS 10 point review of systems is negative except as listed above in HPI.   Physical Exam Blood pressure (!) 133/96, pulse 82, temperature 99.1 F (37.3 C), temperature source Oral, resp. rate 16, SpO2 99 %. Constitutional: well-developed, well-nourished HEENT: pupils equal, round, reactive to light, 75mm b/l, moist conjunctiva, external inspection of ears and nose normal, hearing intact Oropharynx: normal oropharyngeal mucosa, poor dentition Neck: no thyromegaly, trachea midline, no midline cervical tenderness to palpation Chest: breath sounds equal bilaterally, normal respiratory effort, no midline or lateral chest wall tenderness to palpation/deformity Abdomen: soft, NT, no bruising, no hepatosplenomegaly GU: no blood at urethral meatus of penis, large inguinal hernia with contents in the scrotum c/w prolonged chronically incarcerated hernia, 2cm lesion of  R distal scrotum with evidence of prior bleeding, no crepitus, minimal erythema and induration  Back: no wounds, no thoracic/lumbar spine tenderness to palpation, no thoracic/lumbar spine stepoffs Rectal: deferred MSK: unable to assess gait/station, no clubbing/cyanosis of fingers/toes, normal ROM of all four extremities Skin: warm, dry, no rashes Psych: poor memory, normal mood/affect    Assessment/Plan: 75M with large, chronically incarcerated inguinal hernia  No clinical suspicion for acute incarceration and labwork does not support acute incarceration. Recommend WOC for local wound care and consider abx for superficial wound infection. Discussed with hospitalist at the bedside. Patient can follow up in our office with one of our hernia specialists for elective hernia repair as an outpatient.   Jesusita Oka, MD General and [facility]

## 2020-08-09 NOTE — Progress Notes (Signed)
[facility] Progress Note  Patient: Greg [name]    DJM:[id number]  DOA: 08/08/2020     Date of Service: the patient was seen and examined on 08/09/2020  Brief hospital course: Does not see providers on a regular basis.  No past medical history.  Presents with complaint of swelling and ulcer on right scrotum. Work-up showed large right inguinal hernia with ulceration without any evidence of gangrene. General surgery consulted, no evidence of incarceration recommend outpatient follow-up and surgery. Currently plan is rule out bacteremia, continue with IV antibiotics.  Subjective: No nausea no vomiting.  No fever no chills were no chest pain.  No dizziness lightheadedness.  Assessment and Plan: 1.  Right scrotal ulcer and cellulitis. Present on admission. CT pelvis negative for any evidence of Fournier's gangrene. Cultures so far negative. Continue with IV antibiotics. Will require urology consultation for outpatient follow-up.  2.  Chronically incarcerated Right inguinal hernia General surgery consulted.  Currently signed off. Recommend no surgical intervention and outpatient follow-up. Patient does not have any nausea or vomiting.  Tolerating oral diet.  3.  4.2 cm AAA Incidental finding. No abdominal pain. Outpatient follow-up.  4.  Epigastric abdominal pain X-ray shows prominent amount of stool in the right colon and rectum without any obstruction.  No evidence of acute abnormality. Will continue with bowel regimen.  Scheduled Meds:  enoxaparin (LOVENOX) injection  40 mg Subcutaneous Daily   mupirocin ointment   Topical Daily   Continuous Infusions:  ceFEPime (MAXIPIME) IV 2 g (08/09/20 1212)   lactated ringers 100 mL/hr at 08/09/20 0051   Vancomycin 750 mg (08/09/20 1559)   PRN Meds: metoprolol tartrate, morphine injection, ondansetron **OR** ondansetron (ZOFRAN) IV  Body mass index is 20.95 kg/m.        DVT Prophylaxis:   enoxaparin (LOVENOX) injection  40 mg Start: 08/09/20 1000    Advance goals of care discussion: Pt is Full code.  Family Communication: no family was present at bedside, at the time of interview.   Data Reviewed: I have personally reviewed and interpreted daily labs, tele strips, imaging. Mild anemia.  Physical Exam:  General: Appear in mild distress, Right scrotal ulcer, no Rash; Oral Mucosa Clear, moist. no Abnormal Neck Mass Or lumps, Conjunctiva normal  Cardiovascular: S1 and S2 Present, no Murmur, Respiratory: good respiratory effort, Bilateral Air entry present and CTA, no Crackles, no wheezes Abdomen: Bowel Sound present, Soft and epigastric tenderness Extremities: no Pedal edema Neurology: alert and oriented to time, place, and person affect appropriate. no new focal deficit Gait not checked due to patient safety concerns  Vitals:   08/09/20 0000 08/09/20 0038 08/09/20 0544 08/09/20 1404  BP: (!) 133/96 (!) 146/97 (!) 116/99 118/82  Pulse: 82 85 75 77  Resp: 16 18 16 18   Temp: 99.1 F (37.3 C) 97.7 F (36.5 C) 98.5 F (36.9 C) 98.7 F (37.1 C)  TempSrc: Oral Oral Oral Oral  SpO2: 99% 99% 99% 100%  Weight:  62.5 kg    Height:  5\' 8"  (1.727 m)      Disposition:  Status is: Inpatient  Remains inpatient appropriate because:IV treatments appropriate due to intensity of illness or inability to take PO  Dispo: The patient is from: Home              Anticipated d/c is to: to be determined               Patient currently is not medically stable to d/c.   Difficult to  place patient No  Time spent: 35 minutes. I reviewed all nursing notes, pharmacy notes, vitals, pertinent old records. I have discussed plan of care as described above with RN.  Author: Berle Mull, MD [facility] 08/09/2020 6:58 PM  To reach On-call, see care teams to locate the attending and reach out via [email or web address]. Between 7PM-7AM, please contact night-coverage If you still have difficulty reaching the attending  provider, please page the [facility] (Director on Call) for [facility] on amion for assistance.

## 2020-08-09 NOTE — Consult Note (Signed)
WOC Nurse Consult Note: Reason for Consult:Acute on chronic inguinal hernia with cellulitis and nonintact lesion to right scrotum. Patient is a poor historian and cannot recall details of duration of his illness.  Wound type:infectious Pressure Injury POA: NA Measurement: 2 cm x 3 cm wound bed is fibrin and scabbed tissue Wound bed:see above Drainage (amount, consistency, odor) minimal serosanguinous  Periwound:right scrotum is vastly edematous and tender to touch.  Dressing procedure/placement/frequency: Cleanse right scrotal wound with NS and pat dry.  Apply mupirocin ointment to wound bed. Cover with foam dressing. Change daily.  Will not follow at this time.  Please re-consult if needed.  Domenic Moras MSN, RN, FNP-BC CWON Wound, Ostomy, Continence Nurse Pager [phone]

## 2020-08-09 NOTE — Progress Notes (Signed)
Patient arrived to 6N30. AX0X3, able to make needs known. Not in acute distress.no DOB/SOB noted 99%room air. VS stable BP116/99 T98.5 PR75 [id number]. Scrotal swelling noted with an open wound. Elevated area. Patient was oriented to call bell and surroundings. Call bell within reach and will continue to monitor.

## 2020-08-10 ENCOUNTER — Other Ambulatory Visit (HOSPITAL_COMMUNITY): Payer: Self-pay

## 2020-08-10 LAB — URINE CULTURE

## 2020-08-10 MED ORDER — DOXYCYCLINE HYCLATE 100 MG PO TABS
100.0000 mg | ORAL_TABLET | Freq: Two times a day (BID) | ORAL | 0 refills | Status: AC
  Filled 2020-08-10: qty 10, 5d supply, fill #0

## 2020-08-10 MED ORDER — MUPIROCIN 2 % EX OINT
TOPICAL_OINTMENT | Freq: Every day | CUTANEOUS | 2 refills | Status: DC
Start: 2020-08-11 — End: 2020-09-10
  Filled 2020-08-10: qty 22, 7d supply, fill #0

## 2020-08-10 MED ORDER — AMOXICILLIN-POT CLAVULANATE 875-125 MG PO TABS
1.0000 | ORAL_TABLET | Freq: Two times a day (BID) | ORAL | Status: DC
  Administered 2020-08-10: 1 via ORAL
  Filled 2020-08-10: qty 1

## 2020-08-10 MED ORDER — DOXYCYCLINE HYCLATE 100 MG PO TABS
100.0000 mg | ORAL_TABLET | Freq: Two times a day (BID) | ORAL | Status: DC
  Administered 2020-08-10: 100 mg via ORAL
  Filled 2020-08-10: qty 1

## 2020-08-10 MED ORDER — AMOXICILLIN-POT CLAVULANATE 875-125 MG PO TABS
1.0000 | ORAL_TABLET | Freq: Two times a day (BID) | ORAL | 0 refills | Status: AC
  Filled 2020-08-10: qty 10, 5d supply, fill #0

## 2020-08-10 MED ORDER — POLYETHYLENE GLYCOL 3350 17 GM/SCOOP PO POWD
17.0000 g | Freq: Every day | ORAL | 0 refills | Status: DC
  Filled 2020-08-10: qty 510, 30d supply, fill #0

## 2020-08-10 MED ORDER — DOCUSATE SODIUM 100 MG PO CAPS
100.0000 mg | ORAL_CAPSULE | Freq: Two times a day (BID) | ORAL | 0 refills | Status: AC
  Filled 2020-08-10: qty 10, 5d supply, fill #0

## 2020-08-10 NOTE — Progress Notes (Signed)
Pt ready for Dc to home, will [facility] for transport to his house. Instructed pt on wound care to scrotum and supplies given. TOC for meds. DC instructions given and explained.

## 2020-08-10 NOTE — TOC Initial Note (Addendum)
Transition of Care [facility]) - Initial/Assessment Note    Patient Details  Name: Greg [name] MRN: [id number] Date of Birth: 29-May-1950  Transition of Care [facility]) CM/SW Contact:    Marilu Favre, RN Phone Number: 08/10/2020, 3:03 PM  Clinical Narrative:                  Spoke to patient at bedside. Patient does not have PCP. Offered to arrange PCP appointment with a [facility]. Patient denied. Discussed home health and dressing supplies ,patient declined. Patient would not confirm face sheet information. He stated he did not want anyone calling him.  He will need to learn wound care prior to discharge . NCM asked nurse to provide some supplies.     Will enter medications in [facility] with no co pay, miralax not covered   Patient Goals and CMS Choice     Choice offered to / list presented to : Patient  Expected Discharge Plan and Services           Expected Discharge Date: 08/10/20                                    Prior Living Arrangements/Services     Patient language and need for interpreter reviewed:: Yes Do you feel safe going back to the place where you live?: Yes            Criminal Activity/Legal Involvement Pertinent to Current Situation/Hospitalization: No - Comment as needed  Activities of Daily Living      Permission Sought/Granted   Permission granted to share information with : No              Emotional Assessment Appearance:: Appears stated age Attitude/Demeanor/Rapport: Engaged Affect (typically observed): Accepting Orientation: : Oriented to Self, Oriented to Place, Oriented to  Time, Oriented to Situation      Admission diagnosis:  Cellulitis of scrotum [N49.2] SBO (small bowel obstruction) ([place]) [K56.609] Unilateral inguinal hernia with obstruction and without gangrene, recurrence not specified [K40.30] Patient Active Problem List   Diagnosis Date Noted   Cellulitis of scrotum 08/08/2020   Right inguinal hernia 08/08/2020    AAA (abdominal aortic aneurysm) ([place]) 08/08/2020   PCP:  Pcp, No Pharmacy:   [facility] [place] [place], [place] [address] [place] [address] Phone: [phone] Fax: [phone]  [facility] [address] [place] [address] Phone: [phone] Fax: [phone]     Social Determinants of Health (SDOH) Interventions    Readmission Risk Interventions No flowsheet data found.

## 2020-08-10 NOTE — Progress Notes (Signed)
Patient ID: Greg [name], male   DOB: 03/07/1950, 70 y.o.   MRN: [id number]  Chronically incarcerated right inguinal hernia Local wound care per WOCN  Follow-up as outpatient for repair of complex right inguinal hernia Discharge per primary team.  Imogene Burn. Georgette Dover, MD, [facility] Surgery  General Surgery   08/10/2020 8:35 AM

## 2020-08-11 NOTE — Discharge Summary (Signed)
[facility] Discharge Summary   Patient: Greg [name] KXF:[id number]  PCP: Pcp, No  Date of admission: 08/08/2020   Date of discharge: 08/10/2020     Discharge Diagnoses:  Principal Problem:   Cellulitis of scrotum Active Problems:   Right inguinal hernia   AAA (abdominal aortic aneurysm) ([place])   Admitted From: Home Disposition:  Home refused home health  Recommendations for Outpatient Follow-up:  PCP: Does not have a PCP.  Patient refused to arrange for PCP. Follow-up with general surgery for inguinal hernia repair   Follow-up Information     Stechschulte, Nickola Major, MD. Call in 1 week(s).   Specialty: Surgery Why: call to arrange follow up for possible treatment of inguinal hernia Contact information: [address]  [place] [address] [phone]                Discharge Instructions     Diet - low sodium heart healthy   Complete by: As directed    Discharge wound care:   Complete by: As directed    Cleanse right scrotal wound with NS and pat dry.  Apply mupirocin ointment to wound bed. Cover with foam dressing. Change daily.   Increase activity slowly   Complete by: As directed        Diet recommendation: Regular diet  Activity: The patient is advised to gradually reintroduce usual activities, as tolerated  Discharge Condition: stable  Code Status: Full code   History of present illness: As per the H and P dictated on admission, "Greg [name] is a 70 y.o. male with medical history significant of no significant past medical history who presented with pain and swelling of his right scrotal area.  Patient is a poor historian and keeps changing his story along the line.  He reported having a similar swelling about 3 to 4 years ago which was reduced in the hospital not sure where.  He also reported that the current swelling started 3 weeks ago and has been progressive.  It started bleeding in the last few days.  He appears to have a deep  cut also on the right scrotal wall which is oozing blood.  There is also surrounding cellulitis of the scrotum.  CT showed loops of small and large bowel in the scrotal sac consistent with incarcerated inguinal hernia.  Surgery consulted and we have been asked admit the patient for the cellulitis of the scrotal area with open wound infection.   ED Course: Vitals and telemetry stable with temperature of 99.2.  His blood pressure is 144/98.  Urinalysis essentially negative.  CBC and chemistry mostly within normal.  He does have a glucose of 110.  CT abdomen pelvis showed large right inguinal hernia containing multiple small bowel loops in the right colon.  There are mildly prominent fluid-filled small bowel loops in the lower abdomen and pelvis possibly low-grade partial small bowel obstruction.  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  He is being admitted with cellulitis of the right scrotum and wound infection"  Hospital Course:  Summary of his active problems in the hospital is as following. 1.  Right scrotal ulcer and cellulitis. Present on admission. CT pelvis negative for any evidence of Fournier's gangrene. Cultures so far negative. Treated with IV antibiotics. Currently holding off neurology consultation since the patient will be seen by general surgery. Switching to oral antibiotics Augmentin and doxycycline for broad-spectrum coverage for cellulitis. Blood  cultures negative. Urine culture shows multiple species although patient does not have UTI symptoms.   2.  Chronically incarcerated Right inguinal hernia General surgery consulted.  Currently signed off. Recommend no surgical intervention and outpatient follow-up. Patient does not have any nausea or vomiting.  Tolerating oral diet.  Having bowel movement.   3.  4.2 cm AAA Incidental finding. No abdominal pain. Outpatient follow-up. Refused PCP  arrangement.   4.  Epigastric abdominal pain X-ray shows prominent amount of stool in the right colon and rectum without any obstruction.  No evidence of acute abnormality. Will continue with bowel regimen.  Patient was ambulatory without any assistance.  Refused home health. On the day of the discharge the patient's vitals were stable, and no other new acute medical condition were reported. The patient was felt safe to be discharge at Home with No Therapy, pt/family refused..  Consultants: General surgery  Procedures: none  DISCHARGE MEDICATION: Allergies as of 08/10/2020   No Known Allergies      Medication List     STOP taking these medications    aspirin EC 81 MG tablet       TAKE these medications    amoxicillin-clavulanate 875-125 MG tablet Commonly known as: AUGMENTIN Take 1 tablet by mouth every 12 (twelve) hours for 5 days.   docusate sodium 100 MG capsule Commonly known as: Colace Take 1 capsule (100 mg total) by mouth 2 (two) times daily.   doxycycline 100 MG tablet Commonly known as: VIBRA-TABS Take 1 tablet (100 mg total) by mouth 2 (two) times daily for 5 days.   mupirocin ointment 2 % Commonly known as: BACTROBAN Apply topically daily.   polyethylene glycol powder 17 GM/SCOOP powder Commonly known as: GLYCOLAX/MIRALAX Dissolve one capful (17g) in water and drink daily   TUSSIN PO Take 10 mLs by mouth in the morning and at bedtime.   VITAMIN B6 PO Take 1 tablet by mouth daily.               Discharge Care Instructions  (From admission, onward)           Start     Ordered   08/10/20 0000  Discharge wound care:       Comments: Cleanse right scrotal wound with NS and pat dry.  Apply mupirocin ointment to wound bed. Cover with foam dressing. Change daily.   08/10/20 1458            Discharge Exam: Filed Weights   08/09/20 0038  Weight: 62.5 kg   Vitals:   08/10/20 0534 08/10/20 1318  BP: 124/83 118/81  Pulse: 70 73   Resp: 16 18  Temp: 98.1 F (36.7 C) 99 F (37.2 C)  SpO2: 98% 99%   General: Appear in no distress, ulcer without any purulence on right scrotal hernia, no redness or rash; Oral Mucosa Clear, moist. no Abnormal Neck Mass Or lumps, Conjunctiva normal  Cardiovascular: S1 and S2 Present, no Murmur, Respiratory: good respiratory effort, Bilateral Air entry present and CTA, no Crackles, no wheezes Abdomen: Bowel Sound present, Soft and no tenderness Extremities: no Pedal edema Neurology: alert and oriented to time, place, and person affect appropriate. no new focal deficit Gait not checked due to patient safety concerns  The results of significant diagnostics from this hospitalization (including imaging, microbiology, ancillary and laboratory) are listed below for reference.    Significant Diagnostic Studies: CT PELVIS W CONTRAST  Result Date: 08/08/2020 CLINICAL DATA:  Anal/rectal abscess. Concern for Fournier's gangrene.  EXAM: CT PELVIS WITH CONTRAST TECHNIQUE: Multidetector CT imaging of the pelvis was performed using the standard protocol following the bolus administration of intravenous contrast. CONTRAST:  134mL OMNIPAQUE IOHEXOL 300 MG/ML  SOLN COMPARISON:  None. FINDINGS: Urinary Tract:  No abnormality visualized. Bowel: Several small bowel loops as well as the right colon is located within a large right inguinal hernia. There are fluid-filled small bowel loops in the right abdomen. Cannot exclude early small bowel obstruction. Vascular/Lymphatic: Aneurysmal dilatation of the infrarenal aorta measuring up to 3.6 cm with extensive mural plaque. Aneurysmal dilatation of the right common iliac artery measuring 2.5 cm with extensive mural plaque. No adenopathy. Reproductive:  Mild prostate enlargement. Other:  No free fluid or free air. Musculoskeletal: No acute bony abnormality. No evidence of soft tissue abscess or gas. IMPRESSION: Large right inguinal hernia containing multiple small bowel  loops and the right colon. There are mildly prominent fluid-filled small bowel loops in the lower abdomen and pelvis. Cannot exclude early low grade partial small bowel obstruction. No evidence of soft tissue abscess or soft tissue gas. 3.6 cm abdominal aortic aneurysm. 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 Recommend follow-up ultrasound every 2 years. This recommendation follows ACR consensus guidelines: White Paper of the ACR Incidental Findings Committee II on Vascular Findings. J Am Coll Radiol 2013; 10:789-794. Electronically Signed   By: Rolm Baptise M.D.   On: 08/08/2020 22:21   DG Chest Port 1 View  Result Date: 08/08/2020 CLINICAL DATA:  Questionable sepsis EXAM: PORTABLE CHEST 1 VIEW COMPARISON:  None. FINDINGS: The heart size and mediastinal contours are within normal limits. Both lungs are clear. The visualized skeletal structures are unremarkable. IMPRESSION: No active disease. Electronically Signed   By: Rolm Baptise M.D.   On: 08/08/2020 19:02   DG Abd 2 Views  Result Date: 08/09/2020 CLINICAL DATA:  Abdominal pain.  Scrotal cellulitis. EXAM: ABDOMEN - 2 VIEW COMPARISON:  CT 08/08/2020. FINDINGS: Soft tissue structures are unremarkable. No bowel distention noted. Prominent amount of stool noted in the right colon and rectum. Right inguinal hernia, best identified by prior CT. Renal scratched it contrast in the kidneys and bladder. Bladder diverticulum may be present. Pelvic calcifications consistent phleboliths. Degenerative changes lumbar spine and both hips. IMPRESSION: Right inguinal hernia best identified by prior CT. No bowel distention noted. No free air noted. Prominent amount of stool in the right colon and rectum. Electronically Signed   By: Marcello Moores  Register   On: 08/09/2020 12:23    Microbiology: Recent Results (from the past 240 hour(s))  Blood culture (routine single)     Status: None (Preliminary result)   Collection Time: 08/08/20  7:43 PM   Specimen: BLOOD  Result  Value Ref Range Status   Specimen Description BLOOD RIGHT ANTECUBITAL  Final   Special Requests   Final    BOTTLES DRAWN AEROBIC AND ANAEROBIC Blood Culture results may not be optimal due to an inadequate volume of blood received in culture bottles   Culture   Final    NO GROWTH 2 DAYS Performed at [facility] [address] [place], [place] [address]    Report Status PENDING  Incomplete  Urine culture     Status: Abnormal   Collection Time: 08/08/20  8:19 PM   Specimen: In/Out Cath Urine  Result Value Ref Range Status   Specimen Description IN/OUT CATH URINE  Final   Special Requests   Final    NONE Performed at [facility],  [address], [place], [place] [address]    Culture MULTIPLE SPECIES PRESENT, SUGGEST RECOLLECTION (A)  Final   Report Status 08/10/2020 FINAL  Final  SARS CORONAVIRUS 2 (TAT 6-24 HRS) Nasopharyngeal Nasopharyngeal Swab     Status: None   Collection Time: 08/08/20 10:41 PM   Specimen: Nasopharyngeal Swab  Result Value Ref Range Status   SARS Coronavirus 2 NEGATIVE NEGATIVE Final    Comment: (NOTE) SARS-CoV-2 target nucleic acids are NOT DETECTED.  The SARS-CoV-2 RNA is generally detectable in upper and lower respiratory specimens during the acute phase of infection. Negative results do not preclude SARS-CoV-2 infection, do not rule out co-infections with other pathogens, and should not be used as the sole basis for treatment or other patient management decisions. Negative results must be combined with clinical observations, patient history, and epidemiological information. The expected result is Negative.  Fact Sheet for Patients: SugarRoll.be  Fact Sheet for Healthcare Providers: https://www.woods-mathews.com/  This test is not yet approved or cleared by the [place] FDA and  has been authorized for detection and/or diagnosis of SARS-CoV-2 by FDA under an Emergency Use Authorization  (EUA). This EUA will remain  in effect (meaning this test can be used) for the duration of the COVID-19 declaration under Se ction 564(b)(1) of the Act, 21 U.S.C. section 360bbb-3(b)(1), unless the authorization is terminated or revoked sooner.  Performed at [facility], [place] [address] [place], [place] [address]   Blood Culture (routine x 2)     Status: None (Preliminary result)   Collection Time: 08/08/20 11:57 PM   Specimen: BLOOD  Result Value Ref Range Status   Specimen Description BLOOD SITE NOT SPECIFIED  Final   Special Requests   Final    BOTTLES DRAWN AEROBIC AND ANAEROBIC Blood Culture results may not be optimal due to an inadequate volume of blood received in culture bottles   Culture   Final    NO GROWTH 1 DAY Performed at [facility], [place] [address] [place],  [address]    Report Status PENDING  Incomplete     Labs: CBC: Recent Labs  Lab 08/08/20 1830 08/09/20 0335  WBC 7.2 7.0  NEUTROABS 5.2  --   HGB 13.0 12.0*  HCT 39.0 35.7*  MCV 96.1 94.7  PLT 199 322   Basic Metabolic Panel: Recent Labs  Lab 08/08/20 1830 08/09/20 0335  NA 138 138  K 3.9 3.6  CL 105 108  CO2 27 24  GLUCOSE 110* 91  BUN 8 <5*  CREATININE 1.04 1.07  CALCIUM 9.3 8.8*   Liver Function Tests: Recent Labs  Lab 08/08/20 1830 08/09/20 0335  AST 23 19  ALT 17 15  ALKPHOS 61 58  BILITOT 0.4 0.6  PROT 7.3 6.7  ALBUMIN 3.8 3.3*   CBG: No results for input(s): GLUCAP in the last 168 hours.  Time spent: 35 minutes  Signed:  Berle Mull  [facility] 08/10/2020

## 2020-08-13 LAB — CULTURE, BLOOD (SINGLE): Culture: NO GROWTH

## 2020-08-14 LAB — CULTURE, BLOOD (ROUTINE X 2): Culture: NO GROWTH

## 2020-09-10 ENCOUNTER — Emergency Department (HOSPITAL_COMMUNITY)
Admission: EM | Admit: 2020-09-10 | Discharge: 2020-09-10 | Disposition: A | Discharge status: Home / Self Care | Payer: Medicare Other | Attending: Emergency Medicine | Admitting: Emergency Medicine

## 2020-09-10 ENCOUNTER — Emergency Department (HOSPITAL_COMMUNITY): Payer: Medicare Other

## 2020-09-10 ENCOUNTER — Other Ambulatory Visit: Payer: Self-pay

## 2020-09-10 DIAGNOSIS — K460 Unspecified abdominal hernia with obstruction, without gangrene: Secondary | ICD-10-CM

## 2020-09-10 DIAGNOSIS — N5089 Other specified disorders of the male genital organs: Secondary | ICD-10-CM | POA: Insufficient documentation

## 2020-09-10 DIAGNOSIS — K4030 Unilateral inguinal hernia, with obstruction, without gangrene, not specified as recurrent: Secondary | ICD-10-CM | POA: Diagnosis not present

## 2020-09-10 DIAGNOSIS — K4090 Unilateral inguinal hernia, without obstruction or gangrene, not specified as recurrent: Secondary | ICD-10-CM | POA: Diagnosis present

## 2020-09-10 LAB — CBC WITH DIFFERENTIAL/PLATELET
Abs Immature Granulocytes: 0.02 10*3/uL (ref 0.00–0.07)
Basophils Absolute: 0 10*3/uL (ref 0.0–0.1)
Basophils Relative: 1 %
Eosinophils Absolute: 0.2 10*3/uL (ref 0.0–0.5)
Eosinophils Relative: 3 %
HCT: 36.8 % — ABNORMAL LOW (ref 39.0–52.0)
Hemoglobin: 12.6 g/dL — ABNORMAL LOW (ref 13.0–17.0)
Immature Granulocytes: 0 %
Lymphocytes Relative: 21 %
Lymphs Abs: 1.3 10*3/uL (ref 0.7–4.0)
MCH: 32 pg (ref 26.0–34.0)
MCHC: 34.2 g/dL (ref 30.0–36.0)
MCV: 93.4 fL (ref 80.0–100.0)
Monocytes Absolute: 0.6 10*3/uL (ref 0.1–1.0)
Monocytes Relative: 10 %
Neutro Abs: 4.1 10*3/uL (ref 1.7–7.7)
Neutrophils Relative %: 65 %
Platelets: 198 10*3/uL (ref 150–400)
RBC: 3.94 MIL/uL — ABNORMAL LOW (ref 4.22–5.81)
RDW: 14.4 % (ref 11.5–15.5)
WBC: 6.3 10*3/uL (ref 4.0–10.5)
nRBC: 0 % (ref 0.0–0.2)

## 2020-09-10 LAB — COMPREHENSIVE METABOLIC PANEL
ALT: 10 U/L (ref 0–44)
AST: 13 U/L — ABNORMAL LOW (ref 15–41)
Albumin: 3.6 g/dL (ref 3.5–5.0)
Alkaline Phosphatase: 66 U/L (ref 38–126)
Anion gap: 8 (ref 5–15)
BUN: 10 mg/dL (ref 8–23)
CO2: 27 mmol/L (ref 22–32)
Calcium: 9.1 mg/dL (ref 8.9–10.3)
Chloride: 105 mmol/L (ref 98–111)
Creatinine, Ser: 1.17 mg/dL (ref 0.61–1.24)
GFR, Estimated: >60 mL/min (ref >60)
Glucose, Bld: 105 mg/dL — ABNORMAL HIGH (ref 70–99)
Potassium: 4.1 mmol/L (ref 3.5–5.1)
Sodium: 140 mmol/L (ref 135–145)
Total Bilirubin: 0.2 mg/dL — ABNORMAL LOW (ref 0.3–1.2)
Total Protein: 7.2 g/dL (ref 6.5–8.1)

## 2020-09-10 LAB — LACTIC ACID, PLASMA: Lactic Acid, Venous: 1.2 mmol/L (ref 0.5–1.9)

## 2020-09-10 MED ORDER — MUPIROCIN 2 % EX OINT: TOPICAL_OINTMENT | Freq: Every day | CUTANEOUS | 2 refills | Status: DC

## 2020-09-10 MED ORDER — IOHEXOL 300 MG/ML  SOLN
50.0000 mL | Freq: Once | INTRAMUSCULAR | Status: AC | PRN
  Administered 2020-09-10: 50 mL via INTRAVENOUS

## 2020-09-10 NOTE — ED Provider Notes (Signed)
[facility] EMERGENCY DEPARTMENT Provider Note   CSN: [id number] Arrival date & time: 09/10/20  0032     History Chief Complaint  Patient presents with   Hernia    Greg [name] is a 70 y.o. male.  HPI     70 year old male with history of admission 1 month ago with chronically incarcerated right inguinal hernia, right scrotal ulcer and cellulitis treated with IV antibiotics, 4.2 cm AAA, presents with concern for continuing right scrotal pain, swelling, and leakage of fluid.  Is scheduled to see Surgery 8/10, his appt was moved.  Reports wound has been bleeding more recently, worse with walking. Has not seen wound care. Denies purulent drainage.  Denies abdominal pain, nausea, vomiting, fevers. No dysuria.    No past medical history on file.  Patient Active Problem List   Diagnosis Date Noted   Cellulitis of scrotum 08/08/2020   Right inguinal hernia 08/08/2020   AAA (abdominal aortic aneurysm) ([place]) 08/08/2020    No past surgical history on file.     No family history on file.  Social History   Tobacco Use   Smoking status: Never   Smokeless tobacco: Never  Substance Use Topics   Alcohol use: Not Currently   Drug use: Not Currently    Home Medications Prior to Admission medications   Medication Sig Start Date End Date Taking? Authorizing Provider  docusate sodium (COLACE) 100 MG capsule Take 1 capsule (100 mg total) by mouth 2 (two) times daily. 08/10/20 08/10/21  Lavina Hamman, MD  guaiFENesin (TUSSIN PO) Take 10 mLs by mouth in the morning and at bedtime.    [provider]  mupirocin ointment (BACTROBAN) 2 % Apply topically daily. To ulcer 09/10/20   Gareth Morgan, MD  polyethylene glycol powder (GLYCOLAX/MIRALAX) 17 GM/SCOOP powder Dissolve one capful (17g) in water and drink daily 08/11/20   Lavina Hamman, MD  Pyridoxine HCl (VITAMIN B6 PO) Take 1 tablet by mouth daily.    [provider]    Allergies     Patient has no known allergies.  Review of Systems   Review of Systems  Constitutional:  Positive for fever.  Respiratory:  Negative for shortness of breath.   Cardiovascular:  Negative for chest pain.  Gastrointestinal:  Negative for abdominal pain, constipation, diarrhea, nausea and vomiting.  Genitourinary:  Positive for scrotal swelling.  Skin:  Positive for wound.   Physical Exam Updated Vital Signs BP 137/85   Pulse 86   Temp 98.5 F (36.9 C) (Oral)   Resp 17   Ht '5\' 8"'$  (1.727 m)   Wt 61.2 kg   SpO2 100%   BMI 20.53 kg/m   Physical Exam Vitals and nursing note reviewed.  Constitutional:      General: He is not in acute distress.    Appearance: He is well-developed. He is not diaphoretic.  HENT:     Head: Normocephalic and atraumatic.  Eyes:     Conjunctiva/sclera: Conjunctivae normal.  Cardiovascular:     Rate and Rhythm: Normal rate.  Pulmonary:     Effort: Pulmonary effort is normal. No respiratory distress.  Abdominal:     General: There is no distension.     Palpations: Abdomen is soft.     Tenderness: There is no abdominal tenderness. There is no guarding.  Genitourinary:    Comments: Significant swelling right scrotum, no sign of abscess on exam, no erythema, unable to palpate testicle, tenderness right scrotum Musculoskeletal:  Cervical back: Normal range of motion.  Skin:    General: Skin is warm and dry.  Neurological:     Mental Status: He is alert and oriented to person, place, and time.       ED Results / Procedures / Treatments   Labs (all labs ordered are listed, but only abnormal results are displayed) Labs Reviewed  CBC WITH DIFFERENTIAL/PLATELET - Abnormal; Notable for the following components:      Result Value   RBC 3.94 (*)    Hemoglobin 12.6 (*)    HCT 36.8 (*)    All other components within normal limits  COMPREHENSIVE METABOLIC PANEL - Abnormal; Notable for the following components:   Glucose, Bld 105 (*)    AST 13  (*)    Total Bilirubin 0.2 (*)    All other components within normal limits  CULTURE, BLOOD (ROUTINE X 2)  CULTURE, BLOOD (ROUTINE X 2)  LACTIC ACID, PLASMA    EKG None  Radiology CT PELVIS W CONTRAST  Result Date: 09/10/2020 CLINICAL DATA:  Inguinal hernia suspected, no prior imaging severe right groin swelling, open wound, bleeding EXAM: CT PELVIS WITH CONTRAST TECHNIQUE: Multidetector CT imaging of the pelvis was performed using the standard protocol following the bolus administration of intravenous contrast. CONTRAST:  18m OMNIPAQUE IOHEXOL 300 MG/ML  SOLN COMPARISON:  None. FINDINGS: Urinary Tract:  No abnormality visualized. Bowel: Large right inguinal hernia containing multiple bowel loops, presumably small bowel. No visible bowel obstruction. Vascular/Lymphatic: Aortoiliac atherosclerosis. Aneurysmal dilatation of the right common iliac artery measuring 2.4 cm with extensive mural plaque. No adenopathy. Reproductive:  Prostate prominence. Other:  No free fluid or free air. Musculoskeletal: No acute bony abnormality. IMPRESSION: Large right inguinal hernia containing numerous bowel loops, predominantly small bowel although portions of the colon may also be contained within the hernia. No evidence of bowel obstruction. Aneurysmal dilatation of the right common iliac artery with extensive plaque. Maximum diameter 2.4 cm. Electronically Signed   By: KRolm BaptiseM.D.   On: 09/10/2020 13:14    Procedures Procedures   Medications Ordered in ED Medications  iohexol (OMNIPAQUE) 300 MG/ML solution 50 mL (50 mLs Intravenous Contrast Given 09/10/20 1300)    ED Course  I have reviewed the triage vital signs and the nursing notes.  Pertinent labs & imaging results that were available during my care of the patient were reviewed by me and considered in my medical decision making (see chart for details).    MDM Rules/Calculators/A&P                           70year old male with history of  admission 1 month ago with chronically incarcerated right inguinal hernia, right scrotal ulcer and cellulitis treated with IV antibiotics, 4.2 cm AAA, presents with concern for continuing right scrotal pain, swelling, and leakage of fluid.  CT shows continued large right inguinal hernia, no signs of bowel obstruction.  This has been present since prior hospitalization. Is tolerating po, no nausea, no vomiting, no constipation, no leukocytosis, no anemia, normal lactic acid.  Discussed with Dr. T[place] SilosGeneral Surgery who will contact office to expedite follow up.   Has ulcer on scrotum, no cellulitis at this time, not able to express any purulence, no significant bleeding. Had this wound at previous hospitalization, is painful, not consistent with syphilis or HSV. Given  rx again for bactroban, recommend wound care follow up as well as close surgery follow up and  discussed reasons tor return.    Final Clinical Impression(s) / ED Diagnoses Final diagnoses:  Scrotal ulcer  Incarcerated hernia    Rx / DC Orders ED Discharge Orders          Ordered    mupirocin ointment (BACTROBAN) 2 %  Daily        09/10/20 1445             Gareth Morgan, MD 09/10/20 2309

## 2020-09-10 NOTE — ED Notes (Signed)
[employer] [facility] called to transport pt home. Pt discharged and awaiting taxit

## 2020-09-10 NOTE — ED Provider Notes (Signed)
Emergency Medicine Provider Triage Evaluation Note  Greg [name] , a 70 y.o. male  was evaluated in triage.  Pt complains of right groin pain. Right testicle has been swollen for about 2 weeks with open wound along inferior aspect.  States it "gushes blood" and wound has gotten much larger recently.  Denies fever or purulent drainage from the area.  Denies trouble urinating.  Prior ED visit 08/08/20 with CT that showed right inguinal hernia containing bowel loops.  Was admitted for early SBO/scrotal cellulitis.  Review of Systems  Positive: Groin swelling, pain Negative: Difficulty urinating, fever  Physical Exam  BP 131/89 (BP Location: Left Arm)   Pulse 84   Temp 98 F (36.7 C) (Oral)   Resp 18   Ht '5\' 8"'$  (1.727 m)   Wt 61.2 kg   SpO2 100%   BMI 20.53 kg/m  Gen:   Awake, no distress   Resp:  Normal effort  MSK:   Moves extremities without difficulty  Other:  Right testicle is extremely swollen, there is open wound along inferior portion, some blood oozing but does not appear pulsatile, foul odor noted without purulent drainage, mild erythema, no appreciable tissue crepitus on brief exam  Medical Decision Making  Medically screening exam initiated at 12:26 AM.  Appropriate orders placed.  Greg [name] was informed that the remainder of the evaluation will be completed by another provider, this initial triage assessment does not replace that evaluation, and the importance of remaining in the ED until their evaluation is complete.  Labs , cultures, CT pelvis ordered.   Larene Pickett, PA-C 09/10/20 0036    Orpah Greek, MD 09/10/20 [phone]

## 2020-09-10 NOTE — ED Notes (Signed)
Pt requesting a taxi home. Will call [facility] [facility] [employer] per his request.

## 2020-09-10 NOTE — ED Triage Notes (Signed)
Pt has hx of inguinal hernia that has been draining/bleeding and cannot be contained with bandages that he was using.  EDPA to triage for eval.

## 2020-09-10 NOTE — ED Notes (Signed)
Patient transported to CT 

## 2020-09-10 NOTE — ED Notes (Signed)
Pt declined offer for food and drink. Sandwich bag brought into pt room but pt stated "I think I lost my appetite".

## 2020-09-15 LAB — CULTURE, BLOOD (ROUTINE X 2)
Culture: NO GROWTH
Culture: NO GROWTH
Special Requests: ADEQUATE
Special Requests: ADEQUATE

## 2020-10-04 ENCOUNTER — Encounter: Payer: Self-pay | Admitting: Podiatry

## 2020-10-04 ENCOUNTER — Ambulatory Visit (INDEPENDENT_AMBULATORY_CARE_PROVIDER_SITE_OTHER): Payer: Medicare Other | Admitting: Podiatry

## 2020-10-04 ENCOUNTER — Other Ambulatory Visit: Payer: Self-pay

## 2020-10-04 DIAGNOSIS — M79675 Pain in left toe(s): Secondary | ICD-10-CM

## 2020-10-04 DIAGNOSIS — M79674 Pain in right toe(s): Secondary | ICD-10-CM

## 2020-10-04 DIAGNOSIS — B351 Tinea unguium: Secondary | ICD-10-CM | POA: Diagnosis not present

## 2020-10-04 NOTE — Progress Notes (Signed)
  Subjective:  Patient ID: Greg [name], male    DOB: 1950-10-10,  MRN: [id number]  Chief Complaint  Patient presents with   Nail Problem    Thick long discolored curved nails    70 y.o. male returns for the above complaint.  Patient presents with thickened elongated dystrophic toenails x10.  Pain on palpation.  Patient is unable to debride himself.  He would like for me to debride them.  He denies any other acute complaints.  Objective:  There were no vitals filed for this visit. Podiatric Exam: Vascular: dorsalis pedis and posterior tibial pulses are palpable bilateral. Capillary return is immediate. Temperature gradient is WNL. Skin turgor WNL  Sensorium: Normal Semmes Weinstein monofilament test. Normal tactile sensation bilaterally. Nail Exam: Pt has thick disfigured discolored nails with subungual debris noted bilateral entire nail hallux through fifth toenails.  Pain on palpation to the nails. Ulcer Exam: There is no evidence of ulcer or pre-ulcerative changes or infection. Orthopedic Exam: Muscle tone and strength are WNL. No limitations in general ROM. No crepitus or effusions noted.  Skin: No Porokeratosis. No infection or ulcers    Assessment & Plan:   1. Pain due to onychomycosis of toenails of both feet     Patient was evaluated and treated and all questions answered.  Onychomycosis with pain  -Nails palliatively debrided as below. -Educated on self-care  Procedure: Nail Debridement Rationale: pain  Type of Debridement: manual, sharp debridement. Instrumentation: Nail nipper, rotary burr. Number of Nails: 10  Procedures and Treatment: Consent by patient was obtained for treatment procedures. The patient understood the discussion of treatment and procedures well. All questions were answered thoroughly reviewed. Debridement of mycotic and hypertrophic toenails, 1 through 5 bilateral and clearing of subungual debris. No ulceration, no infection noted.  Return  Visit-Office Procedure: Patient instructed to return to the office for a follow up visit 3 months for continued evaluation and treatment.  Boneta Lucks, DPM    No follow-ups on file.

## 2020-10-09 ENCOUNTER — Encounter (HOSPITAL_BASED_OUTPATIENT_CLINIC_OR_DEPARTMENT_OTHER): Payer: Medicare Other | Attending: Internal Medicine | Admitting: Internal Medicine

## 2020-10-09 ENCOUNTER — Other Ambulatory Visit: Payer: Self-pay

## 2020-10-09 DIAGNOSIS — G8220 Paraplegia, unspecified: Secondary | ICD-10-CM | POA: Diagnosis not present

## 2020-10-09 DIAGNOSIS — X58XXXA Exposure to other specified factors, initial encounter: Secondary | ICD-10-CM | POA: Diagnosis not present

## 2020-10-09 DIAGNOSIS — G40909 Epilepsy, unspecified, not intractable, without status epilepticus: Secondary | ICD-10-CM | POA: Diagnosis not present

## 2020-10-09 DIAGNOSIS — S3130XA Unspecified open wound of scrotum and testes, initial encounter: Secondary | ICD-10-CM | POA: Insufficient documentation

## 2020-10-09 DIAGNOSIS — K430 Incisional hernia with obstruction, without gangrene: Secondary | ICD-10-CM | POA: Diagnosis not present

## 2020-10-09 NOTE — Progress Notes (Signed)
MAYER, LAKATOS ([id number]) Visit Report for 10/09/2020 Abuse/Suicide Risk Screen Details Patient Name: Date of Service: Greg [name] [facility] [place] 10/09/2020 2:45 PM Medical Record Number: [id number] Patient Account Number: [id number] Date of Birth/Sex: Treating RN: 07/11/1950 (70 y.o. Greg [name] Primary Care Greg [name]: PCP, NO Other Clinician: Referring Greg [name]: Treating Greg [name]/Extender: Greg [name] in Treatment: 0 Abuse/Suicide Risk Screen Items Answer ABUSE RISK SCREEN: Has anyone close to you tried to hurt or harm you recentlyo No Do you feel uncomfortable with anyone in your familyo No Has anyone forced you do things that you didnt want to doo No Electronic Signature(s) Signed: 10/09/2020 5:13:20 PM By: Greg [name] Entered By: Greg [name] on 10/09/2020 14:30:38 -------------------------------------------------------------------------------- Activities of Daily Living Details Patient Name: Date of Service: Greg [name] [facility] [place] 10/09/2020 2:45 PM Medical Record Number: [id number] Patient Account Number: [id number] Date of Birth/Sex: Treating RN: 1950/08/29 (70 y.o. Greg [name] Primary Care Greg [name]: PCP, NO Other Clinician: Referring Greg [name]: Treating Greg [name]/Extender: Greg [name] in Treatment: 0 Activities of Daily Living Items Answer Activities of Daily Living (Please select one for each item) Drive Automobile Not Able T Medications ake Completely Able Use T elephone Completely Able Care for Appearance Completely Able Use T oilet Completely Able Bath / Shower Completely Able Dress Self Completely Able Feed Self Completely Able Walk Completely Able Get In / Out Bed Completely Able Housework Completely Able Prepare Meals Completely Able Handle Money Completely Able Shop for Self Completely Able Electronic Signature(s) Signed: 10/09/2020 5:13:20 PM By: Greg [name] Entered By: Greg [name] on 10/09/2020  14:30:57 -------------------------------------------------------------------------------- Education Screening Details Patient Name: Date of Service: Greg [name] [place] 10/09/2020 2:45 PM Medical Record Number: [id number] Patient Account Number: [id number] Date of Birth/Sex: Treating RN: [birth year] (70 y.o. Greg [name] Primary Care Greg [name]: PCP, NO Other Clinician: Referring Greg [name]: Treating Greg [name]/Extender: Greg [name] in Treatment: 0 Primary Learner Assessed: Patient Learning Preferences/Education Level/Primary Language Learning Preference: Explanation, Demonstration, Printed Material Highest Education Level: High School Preferred Language: English Cognitive Barrier Language Barrier: No Translator Needed: No Memory Deficit: No Emotional Barrier: No Cultural/Religious Beliefs Affecting Medical Care: No Physical Barrier Impaired Vision: Yes Glasses Impaired Hearing: No Decreased Hand dexterity: No Knowledge/Comprehension Knowledge Level: Medium Comprehension Level: Medium Ability to understand written instructions: Medium Ability to understand verbal instructions: Medium Motivation Anxiety Level: Calm Cooperation: Cooperative Education Importance: Acknowledges Need Interest in Health Problems: Asks Questions Perception: Coherent Willingness to Engage in Self-Management Medium Activities: Readiness to Engage in Self-Management Medium Activities: Notes ED MD notes cognitive impairment. Electronic Signature(s) Signed: 10/09/2020 5:13:20 PM By: Greg [name] Entered By: Greg [name] on 10/09/2020 14:31:55 -------------------------------------------------------------------------------- Fall Risk Assessment Details Patient Name: Date of Service: Greg [name] [place] 10/09/2020 2:45 PM Medical Record Number: [id number] Patient Account Number: [id number] Date of Birth/Sex: Treating RN: 1950-12-07 (70 y.o. Greg [name] Primary Care Greg [name]: PCP,  NO Other Clinician: Referring Greg [name]: Treating Greg [name]/Extender: Greg [name] in Treatment: 0 Fall Risk Assessment Items Have you had 2 or more falls in the last 12 monthso 0 No Have you had any fall that resulted in injury in the last 12 monthso 0 No FALLS RISK SCREEN History of falling - immediate or within 3 months 0 No Secondary diagnosis (Do you have 2 or more medical diagnoseso) 0 No Ambulatory aid None/bed rest/wheelchair/nurse 0 No Crutches/cane/walker 15 Yes Furniture 0 No Intravenous therapy Access/Saline/Heparin Lock 0 No Gait/Transferring Normal/ bed rest/ wheelchair 0 Yes Weak (short steps with or without shuffle, stooped  but able to lift head while walking, may seek 0 No support from furniture) Impaired (short steps with shuffle, may have difficulty arising from chair, head down, impaired 0 No balance) Mental Status Oriented to own ability 0 Yes Electronic Signature(s) Signed: 10/09/2020 5:13:20 PM By: Greg [name] Entered By: Greg [name] on 10/09/2020 14:32:08 -------------------------------------------------------------------------------- Foot Assessment Details Patient Name: Date of Service: Greg [name] [place] 10/09/2020 2:45 PM Medical Record Number: [id number] Patient Account Number: [id number] Date of Birth/Sex: Treating RN: February 13, 1950 (70 y.o. Greg [name] Primary Care Greg [name]: PCP, NO Other Clinician: Referring Greg [name]: Treating Greg [name]/Extender: Greg [name] in Treatment: 0 Foot Assessment Items Site Locations + = Sensation present, - = Sensation absent, C = Callus, U = Ulcer R = Redness, W = Warmth, M = Maceration, PU = Pre-ulcerative lesion F = Fissure, S = Swelling, D = Dryness Assessment Right: Left: Other Deformity: No No Prior Foot Ulcer: No No Prior Amputation: No No Charcot Joint: No No Ambulatory Status: Ambulatory With Help Assistance Device: Cane Gait: Steady Notes No BLE wounds. Electronic  Signature(s) Signed: 10/09/2020 5:13:20 PM By: Greg [name] Entered By: Greg [name] on 10/09/2020 14:32:36 -------------------------------------------------------------------------------- Nutrition Risk Screening Details Patient Name: Date of Service: Greg [name] [place] 10/09/2020 2:45 PM Medical Record Number: [id number] Patient Account Number: [id number] Date of Birth/Sex: Treating RN: 01-07-1951 (70 y.o. Greg [name] Primary Care Johanan Skorupski: PCP, NO Other Clinician: Referring Lorae Roig: Treating Nicklos Gaxiola/Extender: Greg [name] in Treatment: 0 Height (in): 68 Weight (lbs): 135 Body Mass Index (BMI): 20.5 Nutrition Risk Screening Items Score Screening NUTRITION RISK SCREEN: I have an illness or condition that made me change the kind and/or amount of food I eat 0 No I eat fewer than two meals per day 0 No I eat few fruits and vegetables, or milk products 0 No I have three or more drinks of beer, liquor or wine almost every day 0 No I have tooth or mouth problems that make it hard for me to eat 0 No I don't always have enough money to buy the food I need 0 No I eat alone most of the time 0 No I take three or more different prescribed or over-the-counter drugs a day 1 Yes Without wanting to, I have lost or gained 10 pounds in the last six months 0 No I am not always physically able to shop, cook and/or feed myself 0 No Nutrition Protocols Good Risk Protocol Moderate Risk Protocol 0 Provide education on nutrition High Risk Proctocol Risk Level: Good Risk Score: 1 Electronic Signature(s) Signed: 10/09/2020 5:13:20 PM By: Greg [name] Entered By: Greg [name] on 10/09/2020 14:32:23

## 2020-10-10 NOTE — Progress Notes (Signed)
ROME, WOLPERT ([id number]) Visit Report for 10/09/2020 Allergy List Details Patient Name: Date of Service: Greg [name] [facility] [place] 10/09/2020 2:45 PM Medical Record Number: [id number] Patient Account Number: [id number] Date of Birth/Sex: Treating RN: 28-Nov-1950 (70 y.o. Greg [name] Primary Care Greg [name]: PCP, NO Other Clinician: Referring [name]: Treating Greg [name]/Extender: Greg [name] in Treatment: 0 Allergies Active Allergies No Known Drug Allergies Allergy Notes Electronic Signature(s) Signed: 10/09/2020 5:13:20 PM By: Greg [name] Entered By: Greg [name] on 10/09/2020 14:30:31 -------------------------------------------------------------------------------- Arrival Information Details Patient Name: Date of Service: Greg [name] [place] 10/09/2020 2:45 PM Medical Record Number: [id number] Patient Account Number: [id number] Date of Birth/Sex: Treating RN: [birth year] (69 y.o. Greg [name] Primary Care Vaida Kerchner: PCP, NO Other Clinician: Referring [name]: Treating [name]/Extender: Greg [name] in Treatment: 0 Visit Information Patient Arrived: Greg [name] Time: 14:25 Accompanied By: self Transfer Assistance: None Patient Identification Verified: Yes Secondary Verification Process Completed: Yes Patient Requires Transmission-Based Precautions: No Patient Has Alerts: Yes Notes Duck tape on scrotum holding bandaid and gauze in place. Electronic Signature(s) Signed: 10/09/2020 5:13:20 PM By: Greg [name] Entered By: Greg [name] on 10/09/2020 14:48:06 -------------------------------------------------------------------------------- Clinic Level of Care Assessment Details Patient Name: Date of Service: Greg [name] [facility] [place] 10/09/2020 2:45 PM Medical Record Number: [id number] Patient Account Number: [id number] Date of Birth/Sex: Treating RN: [birth year] (70 y.o. [name] Primary Care [name]: PCP, NO Other Clinician: Referring  [name]: Treating [name]/Extender: Greg [name] in Treatment: 0 Clinic Level of Care Assessment Items TOOL 1 Quantity Score X- 1 0 Use when EandM and Procedure is performed on INITIAL visit ASSESSMENTS - Nursing Assessment / Reassessment X- 1 20 General Physical Exam (combine w/ comprehensive assessment (listed just below) when performed on new pt. evals) X- 1 25 Comprehensive Assessment (HX, ROS, Risk Assessments, Wounds Hx, etc.) ASSESSMENTS - Wound and Skin Assessment / Reassessment '[]'$  - 0 Dermatologic / Skin Assessment (not related to wound area) ASSESSMENTS - Ostomy and/or Continence Assessment and Care '[]'$  - 0 Incontinence Assessment and Management '[]'$  - 0 Ostomy Care Assessment and Management (repouching, etc.) PROCESS - Coordination of Care '[]'$  - 0 Simple Patient / Family Education for ongoing care X- 1 20 Complex (extensive) Patient / Family Education for ongoing care X- 1 10 Staff obtains Programmer, systems, Records, T Results / Process Orders est X- 1 10 Staff telephones HHA, Nursing Homes / Clarify orders / etc '[]'$  - 0 Routine Transfer to another Facility (non-emergent condition) '[]'$  - 0 Routine Hospital Admission (non-emergent condition) '[]'$  - 0 New Admissions / Biomedical engineer / Ordering NPWT Apligraf, etc. , '[]'$  - 0 Emergency Hospital Admission (emergent condition) PROCESS - Special Needs '[]'$  - 0 Pediatric / Minor Patient Management '[]'$  - 0 Isolation Patient Management '[]'$  - 0 Hearing / Language / Visual special needs '[]'$  - 0 Assessment of Community assistance (transportation, D/C planning, etc.) '[]'$  - 0 Additional assistance / Altered mentation '[]'$  - 0 Support Surface(s) Assessment (bed, cushion, seat, etc.) INTERVENTIONS - Miscellaneous '[]'$  - 0 External ear exam '[]'$  - 0 Patient Transfer (multiple staff / Civil Service fast streamer / Similar devices) '[]'$  - 0 Simple Staple / Suture removal (25 or less) '[]'$  - 0 Complex Staple / Suture removal (26 or more) '[]'$  -  0 Hypo/Hyperglycemic Management (do not check if billed separately) '[]'$  - 0 Ankle / Brachial Index (ABI) - do not check if billed separately Has the patient been seen at the hospital within the last three years: Yes Total Score: 85 Level Of Care:  New/Established - Level 3 Electronic Signature(s) Signed: 10/09/2020 5:25:35 PM By: Lorrin Jackson Entered By: Lorrin Jackson on 10/09/2020 15:31:16 -------------------------------------------------------------------------------- Encounter Discharge Information Details Patient Name: Date of Service: Greg [name] [place] 10/09/2020 2:45 PM Medical Record Number: [id number] Patient Account Number: [id number] Date of Birth/Sex: Treating RN: February 05, 1951 (70 y.o. [name] Primary Care [name]: PCP, NO Other Clinician: Referring [name]: Treating [name]/Extender: Greg [name] in Treatment: 0 Encounter Discharge Information Items Post Procedure Vitals Discharge Condition: Stable Temperature (F): 98.3 Ambulatory Status: Ambulatory Pulse (bpm): 77 Discharge Destination: Home Respiratory Rate (breaths/min): 18 Transportation: Private Auto Blood Pressure (mmHg): 135/86 Accompanied By: self Schedule Follow-up Appointment: Yes Clinical Summary of Care: Patient Declined Electronic Signature(s) Signed: 10/09/2020 5:42:12 PM By: Baruch Gouty RN, BSN Entered By: Baruch Gouty on 10/09/2020 15:44:07 -------------------------------------------------------------------------------- Lower Extremity Assessment Details Patient Name: Date of Service: Greg [name] [place] 10/09/2020 2:45 PM Medical Record Number: [id number] Patient Account Number: [id number] Date of Birth/Sex: Treating RN: 23-Jan-1951 (70 y.o. Greg [name] Primary Care [name]: PCP, NO Other Clinician: Referring [name]: Treating [name]/Extender: Greg [name] in Treatment: 0 Electronic Signature(s) Signed: 10/09/2020 5:13:20 PM By: Greg [name] Entered By: Greg [name] on 10/09/2020 14:32:44 -------------------------------------------------------------------------------- Multi Wound Chart Details Patient Name: Date of Service: Greg [name] [place] 10/09/2020 2:45 PM Medical Record Number: [id number] Patient Account Number: [id number] Date of Birth/Sex: Treating RN: 1950-08-03 (70 y.o. [name] Primary Care Yassmin Binegar: PCP, NO Other Clinician: Referring Penni Penado: Treating Harlo Jaso/Extender: Greg [name] in Treatment: 0 Vital Signs Height(in): 68 Pulse(bpm): 77 Weight(lbs): 135 Blood Pressure(mmHg): 135/86 Body Mass Index(BMI): 21 Temperature(F): 98.3 Respiratory Rate(breaths/min): 16 Photos: [N/A:N/A] Scrotum N/A N/A Wound Location: Shear/Friction N/A N/A Wounding Event: Abrasion N/A N/A Primary Etiology: 06/15/2020 N/A N/A Date Acquired: 0 N/A N/A Weeks of Treatment: Open N/A N/A Wound Status: 3.2x3.5x0.1 N/A N/A Measurements L x W x D (cm) 8.796 N/A N/A A (cm) : rea 0.88 N/A N/A Volume (cm) : 0.00% N/A N/A % Reduction in A rea: 0.00% N/A N/A % Reduction in Volume: Full Thickness Without Exposed N/A N/A Classification: Support Structures Medium N/A N/A Exudate A mount: Serosanguineous N/A N/A Exudate Type: red, brown N/A N/A Exudate Color: Distinct, outline attached N/A N/A Wound Margin: Medium (34-66%) N/A N/A Granulation A mount: Red, Pink N/A N/A Granulation Quality: Medium (34-66%) N/A N/A Necrotic A mount: Fat Layer (Subcutaneous Tissue): Yes N/A N/A Exposed Structures: Fascia: No Tendon: No Muscle: No Joint: No Bone: No Small (1-33%) N/A N/A Epithelialization: Chemical/Enzymatic/Mechanical N/A N/A Debridement: Pre-procedure Verification/Time Out 15:13 N/A N/A Taken: Other(Gauze) N/A N/A Instrument: None N/A N/A Bleeding: Procedure was tolerated well N/A N/A Debridement Treatment Response: 3.2x3x0.1 N/A N/A Post Debridement Measurements L x W x D  (cm) 0.754 N/A N/A Post Debridement Volume: (cm) Debridement N/A N/A Procedures Performed: Treatment Notes Wound #1 (Scrotum) Cleanser Soap and Water Discharge Instruction: May shower and wash wound with dial antibacterial soap and water prior to dressing change. Peri-Wound Care Topical Zinc Oxide/Desitin Primary Dressing Secondary Dressing ABD Pad, 8x10 Discharge Instruction: Apply over primary dressing as directed. Secured With Mesh Underwear Compression Wrap Compression Stockings Add-Ons Electronic Signature(s) Signed: 10/10/2020 12:43:57 PM By: Kalman Shan DO Signed: 10/10/2020 5:23:58 PM By: Lorrin Jackson Signed: 10/10/2020 5:23:58 PM By: Lorrin Jackson Previous Signature: 10/09/2020 5:25:35 PM Version By: Fara Chute By: Kalman Shan on 10/10/2020 12:06:32 -------------------------------------------------------------------------------- Multi-Disciplinary Care Plan Details Patient Name: Date of Service: Greg [name] [facility] [place] 10/09/2020 2:45 PM Medical Record Number:  [id number] Patient Account Number: [id number] Date of Birth/Sex: Treating RN: 01-28-1951 (70 y.o. [name] Primary Care Mychael Smock: PCP, NO Other Clinician: Referring Selina Tapper: Treating Zettie Gootee/Extender: Greg [name] in Treatment: 0 Active Inactive Wound/Skin Impairment Nursing Diagnoses: Impaired tissue integrity Goals: Patient/caregiver will verbalize understanding of skin care regimen Date Initiated: 10/09/2020 Target Resolution Date: 11/06/2020 Goal Status: Active Ulcer/skin breakdown will have a volume reduction of 30% by week 4 Date Initiated: 10/09/2020 Target Resolution Date: 11/06/2020 Goal Status: Active Interventions: Assess patient/caregiver ability to obtain necessary supplies Assess patient/caregiver ability to perform ulcer/skin care regimen upon admission and as needed Assess ulceration(s) every visit Provide education on ulcer and skin  care Treatment Activities: Topical wound management initiated : 10/09/2020 Notes: Electronic Signature(s) Signed: 10/09/2020 5:25:35 PM By: Lorrin Jackson Entered By: Lorrin Jackson on 10/09/2020 15:12:34 -------------------------------------------------------------------------------- Pain Assessment Details Patient Name: Date of Service: Greg [name] [place] 10/09/2020 2:45 PM Medical Record Number: [id number] Patient Account Number: [id number] Date of Birth/Sex: Treating RN: 1950/10/16 (70 y.o. Greg [name] Primary Care Xian Apostol: PCP, NO Other Clinician: Referring Dao Mearns: Treating Tylon Kemmerling/Extender: Greg [name] in Treatment: 0 Active Problems Location of Pain Severity and Description of Pain Patient Has Paino No Site Locations Rate the pain. Rate the pain. Current Pain Level: 0 Pain Management and Medication Current Pain Management: Medication: No Cold Application: No Rest: No Massage: No Activity: No T.E.N.S.: No Heat Application: No Leg drop or elevation: No Is the Current Pain Management Adequate: Adequate How does your wound impact your activities of daily livingo Sleep: No Bathing: No Appetite: No Relationship With Others: No Bladder Continence: No Emotions: No Bowel Continence: No Work: No Toileting: No Drive: No Dressing: No Hobbies: No Electronic Signature(s) Signed: 10/09/2020 5:13:20 PM By: Greg [name] Entered By: Greg [name] on 10/09/2020 14:32:59 -------------------------------------------------------------------------------- Patient/Caregiver Education Details Patient Name: Date of Service: Greg [name] [place] 8/29/2022andnbsp2:45 PM Medical Record Number: [id number] Patient Account Number: [id number] Date of Birth/Gender: Treating RN: 1950/04/07 (70 y.o. [name] Primary Care Physician: PCP, NO Other Clinician: Referring Physician: Treating Physician/Extender: Greg [name] in Treatment: 0 Education  Assessment Education Provided To: Patient Education Topics Provided Wound/Skin Impairment: Methods: Explain/Verbal, Printed Responses: State content correctly Electronic Signature(s) Signed: 10/09/2020 5:25:35 PM By: Lorrin Jackson Entered By: Lorrin Jackson on 10/09/2020 15:13:05 -------------------------------------------------------------------------------- Wound Assessment Details Patient Name: Date of Service: Greg [name] [place] 10/09/2020 2:45 PM Medical Record Number: [id number] Patient Account Number: [id number] Date of Birth/Sex: Treating RN: [birth year] (70 y.o. [name] Primary Care Angelissa Supan: PCP, NO Other Clinician: Referring Brayant Dorr: Treating Jaleen Finch/Extender: Greg [name] in Treatment: 0 Wound Status Wound Number: 1 Primary Etiology: Abrasion Wound Location: Scrotum Wound Status: Open Wounding Event: Shear/Friction Date Acquired: 06/15/2020 Weeks Of Treatment: 0 Clustered Wound: No Photos Wound Measurements Length: (cm) 3.2 Width: (cm) 3.5 Depth: (cm) 0.1 Area: (cm) 8.796 Volume: (cm) 0.88 % Reduction in Area: 0% % Reduction in Volume: 0% Epithelialization: Small (1-33%) Tunneling: No Undermining: No Wound Description Classification: Full Thickness Without Exposed Support Structures Wound Margin: Distinct, outline attached Exudate Amount: Medium Exudate Type: Serosanguineous Exudate Color: red, brown Foul Odor After Cleansing: No Slough/Fibrino Yes Wound Bed Granulation Amount: Medium (34-66%) Exposed Structure Granulation Quality: Red, Pink Fascia Exposed: No Necrotic Amount: Medium (34-66%) Fat Layer (Subcutaneous Tissue) Exposed: Yes Necrotic Quality: Adherent Slough Tendon Exposed: No Muscle Exposed: No Joint Exposed: No Bone Exposed: No Treatment Notes Wound #1 (Scrotum) Cleanser Soap and Water Discharge Instruction: May shower and wash wound with  dial antibacterial soap and water prior to dressing  change. Peri-Wound Care Topical Zinc Oxide/Desitin Primary Dressing Secondary Dressing ABD Pad, 8x10 Discharge Instruction: Apply over primary dressing as directed. Secured With Mesh Underwear Compression Wrap Compression Stockings Add-Ons Electronic Signature(s) Signed: 10/09/2020 5:13:20 PM By: Greg [name] Signed: 10/09/2020 5:25:35 PM By: Lorrin Jackson Entered By: Greg [name] on 10/09/2020 14:40:16 -------------------------------------------------------------------------------- Vitals Details Patient Name: Date of Service: Greg [name] [place] 10/09/2020 2:45 PM Medical Record Number: [id number] Patient Account Number: [id number] Date of Birth/Sex: Treating RN: Feb 15, 1950 (70 y.o. Greg [name] Primary Care Nyazia Canevari: PCP, NO Other Clinician: Referring Omega Slager: Treating Misti Towle/Extender: Greg [name] in Treatment: 0 Vital Signs Time Taken: 14:28 Temperature (F): 98.3 Height (in): 68 Pulse (bpm): 77 Weight (lbs): 135 Respiratory Rate (breaths/min): 16 Body Mass Index (BMI): 20.5 Blood Pressure (mmHg): 135/86 Reference Range: 80 - 120 mg / dl Electronic Signature(s) Signed: 10/09/2020 5:13:20 PM By: Greg [name] Entered By: Greg [name] on 10/09/2020 14:29:55
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
## 2020-10-23 ENCOUNTER — Encounter (HOSPITAL_BASED_OUTPATIENT_CLINIC_OR_DEPARTMENT_OTHER): Payer: Medicare Other | Attending: Internal Medicine | Admitting: Internal Medicine

## 2020-10-23 ENCOUNTER — Other Ambulatory Visit: Payer: Self-pay

## 2020-10-23 DIAGNOSIS — K430 Incisional hernia with obstruction, without gangrene: Secondary | ICD-10-CM

## 2020-10-23 DIAGNOSIS — S3130XA Unspecified open wound of scrotum and testes, initial encounter: Secondary | ICD-10-CM | POA: Diagnosis present

## 2020-10-23 DIAGNOSIS — X58XXXA Exposure to other specified factors, initial encounter: Secondary | ICD-10-CM | POA: Diagnosis not present

## 2020-10-23 DIAGNOSIS — S3130XD Unspecified open wound of scrotum and testes, subsequent encounter: Secondary | ICD-10-CM

## 2020-10-23 DIAGNOSIS — K4090 Unilateral inguinal hernia, without obstruction or gangrene, not specified as recurrent: Secondary | ICD-10-CM | POA: Insufficient documentation
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
## 2020-10-24 NOTE — Progress Notes (Signed)
Greg [name] ([id number]) Visit Report for 10/23/2020 Arrival Information Details Patient Name: Date of Service: Greg [name] [facility] [place] 10/23/2020 2:45 PM Medical Record Number: [id number] Patient Account Number: [id number] Date of Birth/Sex: Treating RN: [name] (70 y.o. Greg [name] Primary Care Greg [name]: PCP, NO Other Clinician: Referring Greg [name]: Treating Greg [name]/Extender: Greg [name] in Treatment: 2 Visit Information History Since Last Visit Added or deleted any medications: No Patient Arrived: Ambulatory Any new allergies or adverse reactions: No Arrival Time: 14:41 Had a fall or experienced change in No Accompanied By: sister activities of daily living that may affect Transfer Assistance: None risk of falls: Patient Identification Verified: Yes Signs or symptoms of abuse/neglect since last visito No Secondary Verification Process Completed: Yes Hospitalized since last visit: No Patient Requires Transmission-Based Precautions: No Implantable device outside of the clinic excluding No Patient Has Alerts: Yes cellular tissue based products placed in the center since last visit: Has Dressing in Place as Prescribed: Yes Pain Present Now: Yes Electronic Signature(s) Signed: 10/23/2020 2:52:35 PM By: Greg [name] Entered By: Greg [name] on 10/23/2020 14:41:43 -------------------------------------------------------------------------------- Clinic Level of Care Assessment Details Patient Name: Date of Service: Greg [name] [facility] [place] 10/23/2020 2:45 PM Medical Record Number: [id number] Patient Account Number: [id number] Date of Birth/Sex: Treating RN: Greg [name] (70 y.o. Greg [name] Primary Care Greg [name]: PCP, NO Other Clinician: Referring Greg [name]: Treating Greg [name]/Extender: Greg [name] in Treatment: 2 Clinic Level of Care Assessment Items TOOL 4 Quantity Score '[]'$  - 0 Use when only an EandM is performed on FOLLOW-UP visit ASSESSMENTS  - Nursing Assessment / Reassessment X- 1 10 Reassessment of Co-morbidities (includes updates in patient status) X- 1 5 Reassessment of Adherence to Treatment Plan ASSESSMENTS - Wound and Skin A ssessment / Reassessment X - Simple Wound Assessment / Reassessment - one wound 1 5 '[]'$  - 0 Complex Wound Assessment / Reassessment - multiple wounds '[]'$  - 0 Dermatologic / Skin Assessment (not related to wound area) ASSESSMENTS - Focused Assessment '[]'$  - 0 Circumferential Edema Measurements - multi extremities '[]'$  - 0 Nutritional Assessment / Counseling / Intervention '[]'$  - 0 Lower Extremity Assessment (monofilament, tuning fork, pulses) '[]'$  - 0 Peripheral Arterial Disease Assessment (using hand held doppler) ASSESSMENTS - Ostomy and/or Continence Assessment and Care '[]'$  - 0 Incontinence Assessment and Management '[]'$  - 0 Ostomy Care Assessment and Management (repouching, etc.) PROCESS - Coordination of Care X - Simple Patient / Family Education for ongoing care 1 15 '[]'$  - 0 Complex (extensive) Patient / Family Education for ongoing care X- 1 10 Staff obtains Programmer, systems, Records, T Results / Process Orders est '[]'$  - 0 Staff telephones HHA, Nursing Homes / Clarify orders / etc '[]'$  - 0 Routine Transfer to another Facility (non-emergent condition) '[]'$  - 0 Routine Hospital Admission (non-emergent condition) '[]'$  - 0 New Admissions / Biomedical engineer / Ordering NPWT Apligraf, etc. , '[]'$  - 0 Emergency Hospital Admission (emergent condition) X- 1 10 Simple Discharge Coordination '[]'$  - 0 Complex (extensive) Discharge Coordination PROCESS - Special Needs '[]'$  - 0 Pediatric / Minor Patient Management '[]'$  - 0 Isolation Patient Management '[]'$  - 0 Hearing / Language / Visual special needs '[]'$  - 0 Assessment of Community assistance (transportation, D/C planning, etc.) '[]'$  - 0 Additional assistance / Altered mentation '[]'$  - 0 Support Surface(s) Assessment (bed, cushion, seat, etc.) INTERVENTIONS -  Wound Cleansing / Measurement X - Simple Wound Cleansing - one wound 1 5 '[]'$  - 0 Complex Wound Cleansing - multiple wounds X- 1 5 Wound Imaging (  photographs - any number of wounds) '[]'$  - 0 Wound Tracing (instead of photographs) X- 1 5 Simple Wound Measurement - one wound '[]'$  - 0 Complex Wound Measurement - multiple wounds INTERVENTIONS - Wound Dressings X - Small Wound Dressing one or multiple wounds 1 10 '[]'$  - 0 Medium Wound Dressing one or multiple wounds '[]'$  - 0 Large Wound Dressing one or multiple wounds '[]'$  - 0 Application of Medications - topical '[]'$  - 0 Application of Medications - injection INTERVENTIONS - Miscellaneous '[]'$  - 0 External ear exam '[]'$  - 0 Specimen Collection (cultures, biopsies, blood, body fluids, etc.) '[]'$  - 0 Specimen(s) / Culture(s) sent or taken to Lab for analysis '[]'$  - 0 Patient Transfer (multiple staff / Civil Service fast streamer / Similar devices) '[]'$  - 0 Simple Staple / Suture removal (25 or less) '[]'$  - 0 Complex Staple / Suture removal (26 or more) '[]'$  - 0 Hypo / Hyperglycemic Management (close monitor of Blood Glucose) '[]'$  - 0 Ankle / Brachial Index (ABI) - do not check if billed separately X- 1 5 Vital Signs Has the patient been seen at the hospital within the last three years: Yes Total Score: 85 Level Of Care: New/Established - Level 3 Electronic Signature(s) Signed: 10/23/2020 4:50:28 PM By: Greg Gouty RN, BSN Entered By: Greg [name] on 10/23/2020 15:22:23 -------------------------------------------------------------------------------- Encounter Discharge Information Details Patient Name: Date of Service: Greg [name] [place] 10/23/2020 2:45 PM Medical Record Number: [id number] Patient Account Number: [id number] Date of Birth/Sex: Treating RN: [birth year] (70 y.o. Greg [name] Primary Care Greg [name]: PCP, NO Other Clinician: Referring Greg [name]: Treating Jaydy [name]/Extender: Greg [name] in Treatment: 2 Encounter Discharge Information  Items Discharge Condition: Stable Ambulatory Status: Ambulatory Discharge Destination: Home Transportation: Private Auto Accompanied By: sister Schedule Follow-up Appointment: Yes Clinical Summary of Care: Patient Declined Electronic Signature(s) Signed: 10/23/2020 4:50:28 PM By: Greg Gouty RN, BSN Entered By: Greg [name] on 10/23/2020 15:42:58 -------------------------------------------------------------------------------- Multi Wound Chart Details Patient Name: Date of Service: Greg [name] [place] 10/23/2020 2:45 PM Medical Record Number: [id number] Patient Account Number: [id number] Date of Birth/Sex: Treating RN: [name] (70 y.o. Greg [name] Primary Care Quaniya Damas: PCP, NO Other Clinician: Referring Divon Krabill: Treating Nery Kalisz/Extender: Greg [name] in Treatment: 2 Vital Signs Height(in): 68 Pulse(bpm): 88 Weight(lbs): 135 Blood Pressure(mmHg): 172/102 Body Mass Index(BMI): 21 Temperature(F): 98.2 Respiratory Rate(breaths/min): 16 Photos: [1:Scrotum] [N/A:N/A N/A] Wound Location: [1:Shear/Friction] [N/A:N/A] Wounding Event: [1:Abrasion] [N/A:N/A] Primary Etiology: [1:06/15/2020] [N/A:N/A] Date Acquired: [1:2] [N/A:N/A] Weeks of Treatment: [1:Open] [N/A:N/A] Wound Status: [1:Yes] [N/A:N/A] Clustered Wound: [1:4] [N/A:N/A] Clustered Quantity: [1:4x7.2x0.1] [N/A:N/A] Measurements L x W x D (cm) [1:22.619] [N/A:N/A] A (cm) : rea [1:2.262] [N/A:N/A] Volume (cm) : [1:-157.20%] [N/A:N/A] % Reduction in A rea: [1:-157.00%] [N/A:N/A] % Reduction in Volume: [1:Full Thickness Without Exposed] [N/A:N/A] Classification: [1:Support Structures Medium] [N/A:N/A] Exudate Amount: [1:Serosanguineous] [N/A:N/A] Exudate Type: [1:red, brown] [N/A:N/A] Exudate Color: [1:Distinct, outline attached] [N/A:N/A] Wound Margin: [1:Large (67-100%)] [N/A:N/A] Granulation Amount: [1:Red] [N/A:N/A] Granulation Quality: [1:None Present (0%)] [N/A:N/A] Necrotic  Amount: [1:Fat Layer (Subcutaneous Tissue): Yes N/A] Exposed Structures: [1:Fascia: No Tendon: No Muscle: No Joint: No Bone: No Small (1-33%)] [N/A:N/A] Treatment Notes Wound #1 (Scrotum) Cleanser Soap and Water Discharge Instruction: May shower and wash wound with dial antibacterial soap and water prior to dressing change. Peri-Wound Care Topical Zinc Oxide/Desitin Primary Dressing Secondary Dressing ABD Pad, 8x10 Discharge Instruction: Apply over primary dressing as directed. Secured With Mesh Underwear Compression Wrap Compression Stockings Add-Ons Electronic Signature(s) Signed: 10/23/2020 4:54:25 PM By: Kalman Shan DO Signed: 10/24/2020  4:34:23 PM By: Fara Chute By: Kalman Shan on 10/23/2020 16:48:54 -------------------------------------------------------------------------------- Multi-Disciplinary Care Plan Details Patient Name: Date of Service: Greg [name] [place] 10/23/2020 2:45 PM Medical Record Number: [id number] Patient Account Number: [id number] Date of Birth/Sex: Treating RN: [name] (70 y.o. Greg [name] Primary Care Jenille Laszlo: PCP, NO Other Clinician: Referring Lenni Reckner: Treating Blakeley Margraf/Extender: Greg [name] in Treatment: 2 Active Inactive Wound/Skin Impairment Nursing Diagnoses: Impaired tissue integrity Goals: Patient/caregiver will verbalize understanding of skin care regimen Date Initiated: 10/09/2020 Target Resolution Date: 11/06/2020 Goal Status: Active Ulcer/skin breakdown will have a volume reduction of 30% by week 4 Date Initiated: 10/09/2020 Target Resolution Date: 11/06/2020 Goal Status: Active Interventions: Assess patient/caregiver ability to obtain necessary supplies Assess patient/caregiver ability to perform ulcer/skin care regimen upon admission and as needed Assess ulceration(s) every visit Provide education on ulcer and skin care Treatment Activities: Topical wound management initiated :  10/09/2020 Notes: Electronic Signature(s) Signed: 10/23/2020 4:50:28 PM By: Greg Gouty RN, BSN Entered By: Greg [name] on 10/23/2020 15:02:13 -------------------------------------------------------------------------------- Pain Assessment Details Patient Name: Date of Service: Greg [name] [place] 10/23/2020 2:45 PM Medical Record Number: [id number] Patient Account Number: [id number] Date of Birth/Sex: Treating RN: [birth year] [name] (70 y.o. Greg [name] Primary Care Jamarii Banks: PCP, NO Other Clinician: Referring Mancel Lardizabal: Treating Briya Lookabaugh/Extender: Greg [name] in Treatment: 2 Active Problems Location of Pain Severity and Description of Pain Patient Has Paino Yes Site Locations Pain Location: Pain in Ulcers With Dressing Change: Yes Duration of the Pain. Constant / Intermittento Constant Rate the pain. Current Pain Level: 4 Worst Pain Level: 9 Least Pain Level: 4 Character of Pain Describe the Pain: Sharp Pain Management and Medication Current Pain Management: Medication: Yes Is the Current Pain Management Adequate: Adequate How does your wound impact your activities of daily livingo Sleep: No Bathing: No Appetite: No Relationship With Others: No Bladder Continence: No Emotions: No Bowel Continence: No Drive: No Toileting: No Hobbies: Yes Dressing: No Electronic Signature(s) Signed: 10/23/2020 4:41:05 PM By: Lorrin Jackson Signed: 10/23/2020 4:50:28 PM By: Greg Gouty RN, BSN Previous Signature: 10/23/2020 2:52:35 PM Version By: Greg [name] Entered By: Greg [name] on 10/23/2020 14:59:57 -------------------------------------------------------------------------------- Patient/Caregiver Education Details Patient Name: Date of Service: Greg [name] [place] 9/12/2022andnbsp2:45 PM Medical Record Number: [id number] Patient Account Number: [id number] Date of Birth/Gender: Treating RN: [birth year] [name] (70 y.o. Greg [name] Primary Care  Physician: PCP, NO Other Clinician: Referring Physician: Treating Physician/Extender: Greg [name] in Treatment: 2 Education Assessment Education Provided To: Patient Education Topics Provided Wound/Skin Impairment: Methods: Explain/Verbal Responses: Reinforcements needed, State content correctly Electronic Signature(s) Signed: 10/23/2020 4:50:28 PM By: Greg Gouty RN, BSN Entered By: Greg [name] on 10/23/2020 15:02:34 -------------------------------------------------------------------------------- Wound Assessment Details Patient Name: Date of Service: Greg [name] [place] 10/23/2020 2:45 PM Medical Record Number: [id number] Patient Account Number: [id number] Date of Birth/Sex: Treating RN: [name] (70 y.o. Greg [name] Primary Care Xoie Kreuser: PCP, NO Other Clinician: Referring Mohid Furuya: Treating Finnlee Guarnieri/Extender: Greg [name] in Treatment: 2 Wound Status Wound Number: 1 Primary Etiology: Abrasion Wound Location: Scrotum Wound Status: Open Wounding Event: Shear/Friction Date Acquired: 06/15/2020 Weeks Of Treatment: 2 Clustered Wound: Yes Photos Wound Measurements Length: (cm) 4 Width: (cm) 7.2 Depth: (cm) 0.1 Clustered Quantity: 4 Area: (cm) 22.619 Volume: (cm) 2.262 % Reduction in Area: -157.2% % Reduction in Volume: -157% Epithelialization: Small (1-33%) Tunneling: No Undermining: No Wound Description Classification: Full Thickness Without Exposed Support Structures Wound Margin: Distinct, outline attached Exudate Amount: Medium Exudate Type: Serosanguineous Exudate Color:  red, brown Foul Odor After Cleansing: No Slough/Fibrino No Wound Bed Granulation Amount: Large (67-100%) Exposed Structure Granulation Quality: Red Fascia Exposed: No Necrotic Amount: None Present (0%) Fat Layer (Subcutaneous Tissue) Exposed: Yes Tendon Exposed: No Muscle Exposed: No Joint Exposed: No Bone Exposed: No Treatment Notes Wound #1  (Scrotum) Cleanser Soap and Water Discharge Instruction: May shower and wash wound with dial antibacterial soap and water prior to dressing change. Peri-Wound Care Topical Zinc Oxide/Desitin Primary Dressing Secondary Dressing ABD Pad, 8x10 Discharge Instruction: Apply over primary dressing as directed. Secured With Mesh Underwear Compression Wrap Compression Stockings Add-Ons Electronic Signature(s) Signed: 10/23/2020 4:41:05 PM By: Lorrin Jackson Signed: 10/23/2020 4:50:28 PM By: Greg Gouty RN, BSN Previous Signature: 10/23/2020 2:52:35 PM Version By: Greg [name] Previous Signature: 10/23/2020 2:52:35 PM Version By: Greg [name] Entered By: Greg [name] on 10/23/2020 15:01:39 -------------------------------------------------------------------------------- [place] Details Patient Name: Date of Service: Greg [name] [place] 10/23/2020 2:45 PM Medical Record Number: [id number] Patient Account Number: [id number] Date of Birth/Sex: Treating RN: [birth year], [name] (71 y.o. Greg [name] Primary Care Marvis Saefong: PCP, NO Other Clinician: Referring Meygan Kyser: Treating Madai Nuccio/Extender: Greg [name] in Treatment: 2 Vital Signs Time Taken: 14:43 Temperature (F): 98.2 Height (in): 68 Pulse (bpm): 88 Weight (lbs): 135 Respiratory Rate (breaths/min): 16 Body Mass Index (BMI): 20.5 Blood Pressure (mmHg): 172/102 Reference Range: 80 - 120 mg / dl Electronic Signature(s) Signed: 10/23/2020 2:52:35 PM By: Greg [name] Entered By: Greg [name] on 10/23/2020 14:44:05

## 2020-11-07 ENCOUNTER — Other Ambulatory Visit: Payer: Self-pay

## 2020-11-07 ENCOUNTER — Encounter (HOSPITAL_BASED_OUTPATIENT_CLINIC_OR_DEPARTMENT_OTHER): Payer: Medicare Other | Admitting: Internal Medicine

## 2020-11-07 DIAGNOSIS — S3130XD Unspecified open wound of scrotum and testes, subsequent encounter: Secondary | ICD-10-CM | POA: Diagnosis not present

## 2020-11-07 DIAGNOSIS — K430 Incisional hernia with obstruction, without gangrene: Secondary | ICD-10-CM

## 2020-11-07 DIAGNOSIS — S3130XA Unspecified open wound of scrotum and testes, initial encounter: Secondary | ICD-10-CM | POA: Diagnosis not present
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
## 2020-11-08 NOTE — Progress Notes (Signed)
Greg [name], RADIN ([id number]) Visit Report for 11/07/2020 Arrival Information Details Patient Name: Date of Service: Greg [name] [facility] [place] 11/07/2020 12:30 PM Medical Record Number: [id number] Patient Account Number: [id number] Date of Birth/Sex: Treating RN: 1950-10-19 (70 y.o. Greg [name] Primary Care Page Lancon: PCP, NO Other Clinician: Referring [place] Zaragoza: Treating Jaquayla Hege/Extender: [name] in Treatment: 4 Visit Information History Since Last Visit Added or deleted any medications: No Patient Arrived: Ambulatory Any new allergies or adverse reactions: No Arrival Time: 12:45 Had a fall or experienced change in No Accompanied By: Product/process development scientist activities of daily living that may affect Transfer Assistance: None risk of falls: Patient Identification Verified: Yes Signs or symptoms of abuse/neglect since last visito No Secondary Verification Process Completed: Yes Hospitalized since last visit: No Patient Requires Transmission-Based Precautions: No Implantable device outside of the clinic excluding No Patient Has Alerts: Yes cellular tissue based products placed in the center since last visit: Has Dressing in Place as Prescribed: Yes Pain Present Now: Yes Electronic Signature(s) Signed: 11/07/2020 12:59:59 PM By: Sandre Kitty Entered By: Sandre Kitty on 11/07/2020 12:46:11 -------------------------------------------------------------------------------- Clinic Level of Care Assessment Details Patient Name: Date of Service: Greg [name] [name] [place] 11/07/2020 12:30 PM Medical Record Number: [id number] Patient Account Number: [id number] Date of Birth/Sex: Treating RN: [birth year] (70 y.o. Greg [name] Primary Care Lorretta Kerce: PCP, NO Other Clinician: Referring Taffany Heiser: Treating Galya Dunnigan/Extender: [name] in Treatment: 4 Clinic Level of Care Assessment Items TOOL 4 Quantity Score []  - 0 Use when only an EandM is performed on FOLLOW-UP  visit ASSESSMENTS - Nursing Assessment / Reassessment X- 1 10 Reassessment of Co-morbidities (includes updates in patient status) X- 1 5 Reassessment of Adherence to Treatment Plan ASSESSMENTS - Wound and Skin A ssessment / Reassessment []  - 0 Simple Wound Assessment / Reassessment - one wound X- 2 5 Complex Wound Assessment / Reassessment - multiple wounds []  - 0 Dermatologic / Skin Assessment (not related to wound area) ASSESSMENTS - Focused Assessment []  - 0 Circumferential Edema Measurements - multi extremities []  - 0 Nutritional Assessment / Counseling / Intervention []  - 0 Lower Extremity Assessment (monofilament, tuning fork, pulses) []  - 0 Peripheral Arterial Disease Assessment (using hand held doppler) ASSESSMENTS - Ostomy and/or Continence Assessment and Care []  - 0 Incontinence Assessment and Management []  - 0 Ostomy Care Assessment and Management (repouching, etc.) PROCESS - Coordination of Care X - Simple Patient / Family Education for ongoing care 1 15 []  - 0 Complex (extensive) Patient / Family Education for ongoing care X- 1 10 Staff obtains Programmer, systems, Records, T Results / Process Orders est []  - 0 Staff telephones HHA, Nursing Homes / Clarify orders / etc []  - 0 Routine Transfer to another Facility (non-emergent condition) []  - 0 Routine Hospital Admission (non-emergent condition) []  - 0 New Admissions / Biomedical engineer / Ordering NPWT Apligraf, etc. , []  - 0 Emergency Hospital Admission (emergent condition) X- 1 10 Simple Discharge Coordination []  - 0 Complex (extensive) Discharge Coordination PROCESS - Special Needs []  - 0 Pediatric / Minor Patient Management []  - 0 Isolation Patient Management []  - 0 Hearing / Language / Visual special needs []  - 0 Assessment of Community assistance (transportation, D/C planning, etc.) []  - 0 Additional assistance / Altered mentation []  - 0 Support Surface(s) Assessment (bed, cushion, seat,  etc.) INTERVENTIONS - Wound Cleansing / Measurement []  - 0 Simple Wound Cleansing - one wound X- 2 5 Complex Wound Cleansing - multiple wounds X- 1 5 Wound Imaging (photographs -  any number of wounds) []  - 0 Wound Tracing (instead of photographs) []  - 0 Simple Wound Measurement - one wound X- 2 5 Complex Wound Measurement - multiple wounds INTERVENTIONS - Wound Dressings X - Small Wound Dressing one or multiple wounds 2 10 []  - 0 Medium Wound Dressing one or multiple wounds []  - 0 Large Wound Dressing one or multiple wounds X- 1 5 Application of Medications - topical []  - 0 Application of Medications - injection INTERVENTIONS - Miscellaneous []  - 0 External ear exam []  - 0 Specimen Collection (cultures, biopsies, blood, body fluids, etc.) []  - 0 Specimen(s) / Culture(s) sent or taken to Lab for analysis []  - 0 Patient Transfer (multiple staff / Civil Service fast streamer / Similar devices) []  - 0 Simple Staple / Suture removal (25 or less) []  - 0 Complex Staple / Suture removal (26 or more) []  - 0 Hypo / Hyperglycemic Management (close monitor of Blood Glucose) []  - 0 Ankle / Brachial Index (ABI) - do not check if billed separately X- 1 5 Vital Signs Has the patient been seen at the hospital within the last three years: Yes Total Score: 115 Level Of Care: New/Established - Level 3 Electronic Signature(s) Signed: 11/08/2020 5:46:28 PM By: Baruch Gouty RN, BSN Entered By: Baruch Gouty on 11/07/2020 17:36:13 -------------------------------------------------------------------------------- Encounter Discharge Information Details Patient Name: Date of Service: Greg [name] [place] 11/07/2020 12:30 PM Medical Record Number: [id number] Patient Account Number: [id number] Date of Birth/Sex: Treating RN: 1950-09-29 (70 y.o. Greg [name] Primary Care Brooklen Runquist: PCP, NO Other Clinician: Referring Snyder Colavito: Treating Donnivan Villena/Extender: [name] in Treatment:  4 Encounter Discharge Information Items Discharge Condition: Stable Ambulatory Status: Ambulatory Discharge Destination: Home Transportation: Private Auto Accompanied By: self Schedule Follow-up Appointment: Yes Clinical Summary of Care: Patient Declined Electronic Signature(s) Signed: 11/08/2020 5:46:28 PM By: Baruch Gouty RN, BSN Entered By: Baruch Gouty on 11/07/2020 13:35:56 -------------------------------------------------------------------------------- Multi Wound Chart Details Patient Name: Date of Service: Greg [name] [place] 11/07/2020 12:30 PM Medical Record Number: [id number] Patient Account Number: [id number] Date of Birth/Sex: Treating RN: 1950-06-06 (70 y.o. Greg [name] Primary Care Calaya Gildner: PCP, NO Other Clinician: Referring Belia Febo: Treating Illya Gienger/Extender: [name] in Treatment: 4 Vital Signs Height(in): 68 Pulse(bpm): 80 Weight(lbs): 135 Blood Pressure(mmHg): 160/96 Body Mass Index(BMI): 21 Temperature(F): 98.5 Respiratory Rate(breaths/min): 16 Photos: [1:Scrotum] [2:Posterior Scrotum] [N/A:N/A N/A] Wound Location: [1:Shear/Friction] [2:Gradually Appeared] [N/A:N/A] Wounding Event: [1:Abrasion] [2:Pressure Ulcer] [N/A:N/A] Primary Etiology: [1:06/15/2020] [2:10/31/2020] [N/A:N/A] Date Acquired: [1:4] [2:0] [N/A:N/A] Weeks of Treatment: [1:Open] [2:Open] [N/A:N/A] Wound Status: [1:Yes] [2:No] [N/A:N/A] Clustered Wound: [1:4] [2:N/A] [N/A:N/A] Clustered Quantity: [1:2.5x5.5x0.1] [2:1.3x4.5x0.1] [N/A:N/A] Measurements L x W x D (cm) [1:10.799] [2:4.595] [N/A:N/A] A (cm) : rea [1:1.08] [2:0.459] [N/A:N/A] Volume (cm) : [1:-22.80%] [2:0.00%] [N/A:N/A] % Reduction in A rea: [1:-22.70%] [2:0.00%] [N/A:N/A] % Reduction in Volume: [1:Full Thickness Without Exposed] [2:Category/Stage III] [N/A:N/A] Classification: [1:Support Structures Medium] [2:Medium] [N/A:N/A] Exudate Amount: [1:Serosanguineous] [2:Serosanguineous]  [N/A:N/A] Exudate Type: [1:red, brown] [2:red, brown] [N/A:N/A] Exudate Color: [1:Distinct, outline attached] [2:Flat and Intact] [N/A:N/A] Wound Margin: [1:Large (67-100%)] [2:Large (67-100%)] [N/A:N/A] Granulation Amount: [1:Red] [2:Red, Pink] [N/A:N/A] Granulation Quality: [1:None Present (0%)] [2:None Present (0%)] [N/A:N/A] Necrotic Amount: [1:Fat Layer (Subcutaneous Tissue): Yes Fat Layer (Subcutaneous Tissue): Yes N/A] Exposed Structures: [1:Fascia: No Tendon: No Muscle: No Joint: No Bone: No Medium (34-66%)] [2:Fascia: No Tendon: No Muscle: No Joint: No Bone: No Small (1-33%)] [N/A:N/A] Treatment Notes Electronic Signature(s) Signed: 11/07/2020 1:41:25 PM By: Kalman Shan DO Signed: 11/07/2020 5:46:45 PM By: Deon Pilling RN,  BSN Entered By: Kalman Shan on 11/07/2020 13:32:55 -------------------------------------------------------------------------------- [place] Details Patient Name: Date of Service: Greg [name] [facility] [place] 11/07/2020 12:30 PM Medical Record Number: [id number] Patient Account Number: [id number] Date of Birth/Sex: Treating RN: 1950/12/03 (70 y.o. Greg [name] Primary Care Natasa Stigall: PCP, NO Other Clinician: Referring Altonio Schwertner: Treating Tayveon Lombardo/Extender: [name] in Treatment: 4 Multidisciplinary Care Plan reviewed with physician Active Inactive Wound/Skin Impairment Nursing Diagnoses: Impaired tissue integrity Goals: Patient/caregiver will verbalize understanding of skin care regimen Date Initiated: 10/09/2020 Target Resolution Date: 12/05/2020 Goal Status: Active Ulcer/skin breakdown will have a volume reduction of 30% by week 4 Date Initiated: 10/09/2020 Date Inactivated: 11/07/2020 Target Resolution Date: 11/06/2020 Unmet Reason: pt refuses surgical Goal Status: Unmet reduction of hernia Ulcer/skin breakdown will have a volume reduction of 50% by week 8 Date Initiated: 11/07/2020 Target Resolution Date:  12/05/2020 Goal Status: Active Interventions: Assess patient/caregiver ability to obtain necessary supplies Assess patient/caregiver ability to perform ulcer/skin care regimen upon admission and as needed Assess ulceration(s) every visit Provide education on ulcer and skin care Treatment Activities: Topical wound management initiated : 10/09/2020 Notes: Electronic Signature(s) Signed: 11/08/2020 5:46:28 PM By: Baruch Gouty RN, BSN Entered By: Baruch Gouty on 11/07/2020 13:10:10 -------------------------------------------------------------------------------- Pain Assessment Details Patient Name: Date of Service: Greg [name] [place] 11/07/2020 12:30 PM Medical Record Number: [id number] Patient Account Number: [id number] Date of Birth/Sex: Treating RN: 1950/02/13 (70 y.o. Greg [name] Primary Care Jaxsun Ciampi: PCP, NO Other Clinician: Referring Daijon Wenke: Treating Ilda Laskin/Extender: [name] in Treatment: 4 Active Problems Location of Pain Severity and Description of Pain Patient Has Paino Yes Site Locations Rate the pain. Current Pain Level: 10 Pain Management and Medication Current Pain Management: Electronic Signature(s) Signed: 11/07/2020 12:59:59 PM By: Sandre Kitty Signed: 11/07/2020 5:46:45 PM By: Deon Pilling RN, BSN Entered By: Sandre Kitty on 11/07/2020 12:46:35 -------------------------------------------------------------------------------- Patient/Caregiver Education Details Patient Name: Date of Service: Greg [name] [place] 9/27/2022andnbsp12:30 PM Medical Record Number: [id number] Patient Account Number: [id number] Date of Birth/Gender: Treating RN: 1950-04-24 (70 y.o. Greg [name] Primary Care Physician: PCP, NO Other Clinician: Referring Physician: Treating Physician/Extender: [name] in Treatment: 4 Education Assessment Education Provided To: Patient Education Topics Provided Wound/Skin Impairment: Methods:  Explain/Verbal Responses: Reinforcements needed, State content correctly Electronic Signature(s) Signed: 11/08/2020 5:46:28 PM By: Baruch Gouty RN, BSN Entered By: Baruch Gouty on 11/07/2020 13:10:23 -------------------------------------------------------------------------------- Wound Assessment Details Patient Name: Date of Service: Greg [name] [place] 11/07/2020 12:30 PM Medical Record Number: [id number] Patient Account Number: [id number] Date of Birth/Sex: Treating RN: [birth year] (70 y.o. Greg [name] Primary Care Lenon Kuennen: PCP, NO Other Clinician: Referring Nephtali Docken: Treating Danaka Llera/Extender: [name] in Treatment: 4 Wound Status Wound Number: 1 Primary Etiology: Abrasion Wound Location: Scrotum Wound Status: Open Wounding Event: Shear/Friction Date Acquired: 06/15/2020 Weeks Of Treatment: 4 Clustered Wound: Yes Photos Wound Measurements Length: (cm) 2.5 Width: (cm) 5.5 Depth: (cm) 0.1 Clustered Quantity: 4 Area: (cm) 10.799 Volume: (cm) 1.08 % Reduction in Area: -22.8% % Reduction in Volume: -22.7% Epithelialization: Medium (34-66%) Tunneling: No Undermining: No Wound Description Classification: Full Thickness Without Exposed Support Stru Wound Margin: Distinct, outline attached Exudate Amount: Medium Exudate Type: Serosanguineous Exudate Color: red, brown ctures Foul Odor After Cleansing: No Slough/Fibrino No Wound Bed Granulation Amount: Large (67-100%) Exposed Structure Granulation Quality: Red Fascia Exposed: No Necrotic Amount: None Present (0%) Fat Layer (Subcutaneous Tissue) Exposed: Yes Tendon Exposed: No Muscle Exposed: No Joint Exposed: No Bone Exposed: No Treatment Notes Wound #1 (  Scrotum) Cleanser Soap and Water Discharge Instruction: May shower and wash wound with dial antibacterial soap and water prior to dressing change. Peri-Wound Care Topical Bacitracin Ointment, 1 (oz) tube Discharge Instruction: thin  layer to wound bed Zinc Oxide/Desitin Primary Dressing Secondary Dressing ABD Pad, 8x10 Discharge Instruction: Apply over primary dressing as directed. Secured With Mesh Underwear Compression Wrap Compression Stockings Environmental education officer) Signed: 11/07/2020 5:46:45 PM By: Deon Pilling RN, BSN Signed: 11/08/2020 5:46:28 PM By: Baruch Gouty RN, BSN Previous Signature: 11/07/2020 12:59:59 PM Version By: Sandre Kitty Entered By: Baruch Gouty on 11/07/2020 13:05:22 -------------------------------------------------------------------------------- Wound Assessment Details Patient Name: Date of Service: Greg [name] [facility] [place] 11/07/2020 12:30 PM Medical Record Number: [id number] Patient Account Number: [id number] Date of Birth/Sex: Treating RN: [birth year] (70 y.o. Greg [name] Primary Care Tasha Jindra: PCP, NO Other Clinician: Referring Tonnie Stillman: Treating Stassi Fadely/Extender: [name] in Treatment: 4 Wound Status Wound Number: 2 Primary Etiology: Pressure Ulcer Wound Location: Posterior Scrotum Wound Status: Open Wounding Event: Gradually Appeared Date Acquired: 10/31/2020 Weeks Of Treatment: 0 Clustered Wound: No Photos Wound Measurements Length: (cm) 1.3 Width: (cm) 4.5 Depth: (cm) 0.1 Area: (cm) 4.595 Volume: (cm) 0.459 % Reduction in Area: 0% % Reduction in Volume: 0% Epithelialization: Small (1-33%) Tunneling: No Undermining: No Wound Description Classification: Category/Stage III Wound Margin: Flat and Intact Exudate Amount: Medium Exudate Type: Serosanguineous Exudate Color: red, brown Foul Odor After Cleansing: No Slough/Fibrino No Wound Bed Granulation Amount: Large (67-100%) Exposed Structure Granulation Quality: Red, Pink Fascia Exposed: No Necrotic Amount: None Present (0%) Fat Layer (Subcutaneous Tissue) Exposed: Yes Tendon Exposed: No Muscle Exposed: No Joint Exposed: No Bone Exposed: No Treatment Notes Wound #2  (Scrotum) Wound Laterality: Posterior Cleanser Soap and Water Discharge Instruction: May shower and wash wound with dial antibacterial soap and water prior to dressing change. Peri-Wound Care Topical Bacitracin Ointment, 1 (oz) tube Discharge Instruction: thin layer to wound bed Zinc Oxide/Desitin Primary Dressing Secondary Dressing ABD Pad, 8x10 Discharge Instruction: Apply over primary dressing as directed. Secured With Mesh Underwear Compression Wrap Compression Stockings Add-Ons Electronic Signature(s) Signed: 11/08/2020 11:11:37 AM By: Sandre Kitty Signed: 11/08/2020 5:46:28 PM By: Baruch Gouty RN, BSN Entered By: Sandre Kitty on 11/07/2020 13:13:18 -------------------------------------------------------------------------------- Vitals Details Patient Name: Date of Service: Greg [name] [place] 11/07/2020 12:30 PM Medical Record Number: [id number] Patient Account Number: [id number] Date of Birth/Sex: Treating RN: December 29, 1950 (70 y.o. Greg [name] Primary Care Malaquias Lenker: PCP, NO Other Clinician: Referring Milca Sytsma: Treating Leyna Vanderkolk/Extender: [name] in Treatment: 4 Vital Signs Time Taken: 12:46 Temperature (F): 98.5 Height (in): 68 Pulse (bpm): 80 Weight (lbs): 135 Respiratory Rate (breaths/min): 16 Body Mass Index (BMI): 20.5 Blood Pressure (mmHg): 160/96 Reference Range: 80 - 120 mg / dl Electronic Signature(s) Signed: 11/07/2020 12:59:59 PM By: Sandre Kitty Entered By: Sandre Kitty on 11/07/2020 12:46:27

## 2020-11-20 ENCOUNTER — Other Ambulatory Visit: Payer: Self-pay

## 2020-11-20 ENCOUNTER — Encounter (HOSPITAL_BASED_OUTPATIENT_CLINIC_OR_DEPARTMENT_OTHER): Payer: Medicare Other | Attending: Internal Medicine | Admitting: Internal Medicine

## 2020-11-20 DIAGNOSIS — Y939 Activity, unspecified: Secondary | ICD-10-CM | POA: Diagnosis not present

## 2020-11-20 DIAGNOSIS — K430 Incisional hernia with obstruction, without gangrene: Secondary | ICD-10-CM | POA: Diagnosis not present

## 2020-11-20 DIAGNOSIS — K4090 Unilateral inguinal hernia, without obstruction or gangrene, not specified as recurrent: Secondary | ICD-10-CM | POA: Diagnosis not present

## 2020-11-20 DIAGNOSIS — S31103A Unspecified open wound of abdominal wall, right lower quadrant without penetration into peritoneal cavity, initial encounter: Secondary | ICD-10-CM | POA: Diagnosis not present

## 2020-11-20 DIAGNOSIS — X58XXXA Exposure to other specified factors, initial encounter: Secondary | ICD-10-CM | POA: Diagnosis not present

## 2020-11-20 DIAGNOSIS — S3130XD Unspecified open wound of scrotum and testes, subsequent encounter: Secondary | ICD-10-CM | POA: Diagnosis not present
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
## 2020-11-21 ENCOUNTER — Encounter (HOSPITAL_BASED_OUTPATIENT_CLINIC_OR_DEPARTMENT_OTHER): Payer: PRIVATE HEALTH INSURANCE | Admitting: Internal Medicine

## 2020-11-22 NOTE — Progress Notes (Signed)
[name], [name] ([id number]) Visit Report for 11/20/2020 Arrival Information Details Patient Name: Date of Service: Greg [name] [facility] [place] 11/20/2020 2:00 PM Medical Record Number: [id number] Patient Account Number: [id number] Date of Birth/Sex: Treating RN: January 27, 1951 (70 y.o. [name] Primary Care Jaqua Ching: PCP, NO Other Clinician: Referring Alphonsine Minium: Treating Graig Hessling/Extender: [name] in Treatment: 6 Visit Information History Since Last Visit Added or deleted any medications: No Patient Arrived: Ambulatory Any new allergies or adverse reactions: No Arrival Time: 13:49 Had a fall or experienced change in No Accompanied By: sister activities of daily living that may affect Transfer Assistance: None risk of falls: Patient Identification Verified: Yes Signs or symptoms of abuse/neglect since last visito No Secondary Verification Process Completed: Yes Hospitalized since last visit: No Patient Requires Transmission-Based Precautions: No Implantable device outside of the clinic excluding No Patient Has Alerts: Yes cellular tissue based products placed in the center since last visit: Has Dressing in Place as Prescribed: Yes Pain Present Now: Yes Electronic Signature(s) Signed: 11/20/2020 4:58:11 PM By: Baruch Gouty RN, BSN Entered By: Baruch Gouty on 11/20/2020 13:52:31 -------------------------------------------------------------------------------- Clinic Level of Care Assessment Details Patient Name: Date of Service: Greg [name] [facility] [place] 11/20/2020 2:00 PM Medical Record Number: [id number] Patient Account Number: [id number] Date of Birth/Sex: Treating RN: 04/16/1950 (70 y.o. [name] Primary Care Linzy Laury: PCP, NO Other Clinician: Referring Luie Laneve: Treating Kekoa Fyock/Extender: [name] in Treatment: 6 Clinic Level of Care Assessment Items TOOL 4 Quantity Score []  - 0 Use when only an EandM is performed on FOLLOW-UP  visit ASSESSMENTS - Nursing Assessment / Reassessment X- 1 10 Reassessment of Co-morbidities (includes updates in patient status) X- 1 5 Reassessment of Adherence to Treatment Plan ASSESSMENTS - Wound and Skin A ssessment / Reassessment []  - 0 Simple Wound Assessment / Reassessment - one wound X- 2 5 Complex Wound Assessment / Reassessment - multiple wounds []  - 0 Dermatologic / Skin Assessment (not related to wound area) ASSESSMENTS - Focused Assessment []  - 0 Circumferential Edema Measurements - multi extremities []  - 0 Nutritional Assessment / Counseling / Intervention []  - 0 Lower Extremity Assessment (monofilament, tuning fork, pulses) []  - 0 Peripheral Arterial Disease Assessment (using hand held doppler) ASSESSMENTS - Ostomy and/or Continence Assessment and Care []  - 0 Incontinence Assessment and Management []  - 0 Ostomy Care Assessment and Management (repouching, etc.) PROCESS - Coordination of Care X - Simple Patient / Family Education for ongoing care 1 15 []  - 0 Complex (extensive) Patient / Family Education for ongoing care X- 1 10 Staff obtains Programmer, systems, Records, T Results / Process Orders est []  - 0 Staff telephones HHA, Nursing Homes / Clarify orders / etc []  - 0 Routine Transfer to another Facility (non-emergent condition) []  - 0 Routine Hospital Admission (non-emergent condition) []  - 0 New Admissions / Biomedical engineer / Ordering NPWT Apligraf, etc. , []  - 0 Emergency Hospital Admission (emergent condition) X- 1 10 Simple Discharge Coordination []  - 0 Complex (extensive) Discharge Coordination PROCESS - Special Needs []  - 0 Pediatric / Minor Patient Management []  - 0 Isolation Patient Management []  - 0 Hearing / Language / Visual special needs []  - 0 Assessment of Community assistance (transportation, D/C planning, etc.) []  - 0 Additional assistance / Altered mentation []  - 0 Support Surface(s) Assessment (bed, cushion, seat,  etc.) INTERVENTIONS - Wound Cleansing / Measurement []  - 0 Simple Wound Cleansing - one wound X- 2 5 Complex Wound Cleansing - multiple wounds X- 1 5 Wound Imaging (  photographs - any number of wounds) []  - 0 Wound Tracing (instead of photographs) []  - 0 Simple Wound Measurement - one wound X- 2 5 Complex Wound Measurement - multiple wounds INTERVENTIONS - Wound Dressings X - Small Wound Dressing one or multiple wounds 2 10 []  - 0 Medium Wound Dressing one or multiple wounds []  - 0 Large Wound Dressing one or multiple wounds []  - 0 Application of Medications - topical []  - 0 Application of Medications - injection INTERVENTIONS - Miscellaneous []  - 0 External ear exam []  - 0 Specimen Collection (cultures, biopsies, blood, body fluids, etc.) []  - 0 Specimen(s) / Culture(s) sent or taken to Lab for analysis []  - 0 Patient Transfer (multiple staff / Civil Service fast streamer / Similar devices) []  - 0 Simple Staple / Suture removal (25 or less) []  - 0 Complex Staple / Suture removal (26 or more) []  - 0 Hypo / Hyperglycemic Management (close monitor of Blood Glucose) []  - 0 Ankle / Brachial Index (ABI) - do not check if billed separately X- 1 5 Vital Signs Has the patient been seen at the hospital within the last three years: Yes Total Score: 110 Level Of Care: New/Established - Level 3 Electronic Signature(s) Signed: 11/20/2020 4:58:11 PM By: Baruch Gouty RN, BSN Entered By: Baruch Gouty on 11/20/2020 14:37:19 -------------------------------------------------------------------------------- Encounter Discharge Information Details Patient Name: Date of Service: Greg [name] [place] 11/20/2020 2:00 PM Medical Record Number: [id number] Patient Account Number: [id number] Date of Birth/Sex: Treating RN: [birth year] (69 y.o. [name] Primary Care Rmani Kapusta: PCP, NO Other Clinician: Referring Dontre Laduca: Treating Jahzir Strohmeier/Extender: [name] in Treatment:  6 Encounter Discharge Information Items Discharge Condition: Stable Ambulatory Status: Ambulatory Discharge Destination: Home Transportation: Private Auto Accompanied By: sister Schedule Follow-up Appointment: Yes Clinical Summary of Care: Patient Declined Electronic Signature(s) Signed: 11/20/2020 4:58:11 PM By: Baruch Gouty RN, BSN Entered By: Baruch Gouty on 11/20/2020 14:40:01 -------------------------------------------------------------------------------- Lower Extremity Assessment Details Patient Name: Date of Service: Greg [name] [place] 11/20/2020 2:00 PM Medical Record Number: [id number] Patient Account Number: [id number] Date of Birth/Sex: Treating RN: Jan 19, 1951 (70 y.o. [name] Primary Care Pearlina Friedly: PCP, NO Other Clinician: Referring Linday Rhodes: Treating Kamiryn Bezanson/Extender: [name] in Treatment: 6 Electronic Signature(s) Signed: 11/20/2020 4:58:11 PM By: Baruch Gouty RN, BSN Entered By: Baruch Gouty on 11/20/2020 13:56:27 -------------------------------------------------------------------------------- Multi Wound Chart Details Patient Name: Date of Service: Greg [name] [place] 11/20/2020 2:00 PM Medical Record Number: [id number] Patient Account Number: [id number] Date of Birth/Sex: Treating RN: 1950-07-10 (70 y.o. [name] Primary Care Jacqualin Shirkey: PCP, NO Other Clinician: Referring Mikale Silversmith: Treating Ram Haugan/Extender: [name] in Treatment: 6 Vital Signs Height(in): 68 Pulse(bpm): 83 Weight(lbs): 135 Blood Pressure(mmHg): 143/93 Body Mass Index(BMI): 21 Temperature(F): 98.3 Respiratory Rate(breaths/min): 18 Photos: [N/A:N/A] Scrotum Posterior Scrotum N/A Wound Location: Shear/Friction Gradually Appeared N/A Wounding Event: Abrasion Pressure Ulcer N/A Primary Etiology: 06/15/2020 10/31/2020 N/A Date Acquired: 6 1 N/A Weeks of Treatment: Open Open N/A Wound Status: Yes No N/A Clustered Wound: 4  N/A N/A Clustered Quantity: 4x4.5x0.1 0.4x1.5x0.1 N/A Measurements L x W x D (cm) 14.137 0.471 N/A A (cm) : rea 1.414 0.047 N/A Volume (cm) : -60.70% 89.70% N/A % Reduction in A rea: -60.70% 89.80% N/A % Reduction in Volume: Full Thickness Without Exposed Category/Stage III N/A Classification: Support Structures Medium Small N/A Exudate Amount: Sanguinous Serosanguineous N/A Exudate Type: red red, brown N/A Exudate Color: Flat and Intact Flat and Intact N/A Wound Margin: Large (67-100%) Large (67-100%) N/A Granulation Amount:  Red Red N/A Granulation Quality: None Present (0%) None Present (0%) N/A Necrotic Amount: Fat Layer (Subcutaneous Tissue): Yes Fat Layer (Subcutaneous Tissue): Yes N/A Exposed Structures: Fascia: No Fascia: No Tendon: No Tendon: No Muscle: No Muscle: No Joint: No Joint: No Bone: No Bone: No Small (1-33%) Medium (34-66%) N/A Epithelialization: Treatment Notes Wound #1 (Scrotum) Cleanser Soap and Water Discharge Instruction: May shower and wash wound with dial antibacterial soap and water prior to dressing change. Peri-Wound Care Topical Primary Dressing KerraCel Ag Gelling Fiber Dressing, 4x5 in (silver alginate) Discharge Instruction: Apply silver alginate to wound bed as instructed Secondary Dressing ABD Pad, 8x10 Discharge Instruction: Apply over primary dressing as directed. Secured With Mesh Underwear Compression Wrap Compression Stockings Add-Ons Wound #2 (Scrotum) Wound Laterality: Posterior Cleanser Soap and Water Discharge Instruction: May shower and wash wound with dial antibacterial soap and water prior to dressing change. Peri-Wound Care Topical Primary Dressing KerraCel Ag Gelling Fiber Dressing, 2x2 in (silver alginate) Discharge Instruction: Apply silver alginate to wound bed as instructed Secondary Dressing ABD Pad, 8x10 Discharge Instruction: Apply over primary dressing as directed. Secured With Mesh  Underwear Compression Wrap Compression Stockings Add-Ons Electronic Signature(s) Signed: 11/20/2020 2:59:13 PM By: Kalman Shan DO Signed: 11/20/2020 5:11:49 PM By: Fara Chute By: Kalman Shan on 11/20/2020 14:54:30 -------------------------------------------------------------------------------- Multi-Disciplinary Care Plan Details Patient Name: Date of Service: Greg [name] [place] 11/20/2020 2:00 PM Medical Record Number: [id number] Patient Account Number: [id number] Date of Birth/Sex: Treating RN: [birth year] (71 y.o. [name] Primary Care Jere Bostrom: PCP, NO Other Clinician: Referring Christan Ciccarelli: Treating Gregary Blackard/Extender: [name] in Treatment: 6 Multidisciplinary Care Plan reviewed with physician Active Inactive Wound/Skin Impairment Nursing Diagnoses: Impaired tissue integrity Goals: Patient/caregiver will verbalize understanding of skin care regimen Date Initiated: 10/09/2020 Target Resolution Date: 12/05/2020 Goal Status: Active Ulcer/skin breakdown will have a volume reduction of 30% by week 4 Date Initiated: 10/09/2020 Date Inactivated: 11/07/2020 Target Resolution Date: 11/06/2020 Unmet Reason: pt refuses surgical Goal Status: Unmet reduction of hernia Ulcer/skin breakdown will have a volume reduction of 50% by week 8 Date Initiated: 11/07/2020 Target Resolution Date: 12/05/2020 Goal Status: Active Interventions: Assess patient/caregiver ability to obtain necessary supplies Assess patient/caregiver ability to perform ulcer/skin care regimen upon admission and as needed Assess ulceration(s) every visit Provide education on ulcer and skin care Treatment Activities: Topical wound management initiated : 10/09/2020 Notes: Electronic Signature(s) Signed: 11/20/2020 4:58:11 PM By: Baruch Gouty RN, BSN Entered By: Baruch Gouty on 11/20/2020  14:05:13 -------------------------------------------------------------------------------- Pain Assessment Details Patient Name: Date of Service: Greg [name] [place] 11/20/2020 2:00 PM Medical Record Number: [id number] Patient Account Number: [id number] Date of Birth/Sex: Treating RN: 1950/02/17 (70 y.o. [name] Primary Care Deepti Gunawan: PCP, NO Other Clinician: Referring Marysa Wessner: Treating Tecumseh Yeagley/Extender: [name] in Treatment: 6 Active Problems Location of Pain Severity and Description of Pain Patient Has Paino Yes Site Locations Pain Location: Pain in Ulcers With Dressing Change: Yes Duration of the Pain. Constant / Intermittento Constant Rate the pain. Current Pain Level: 8 Worst Pain Level: 9 Least Pain Level: 6 Character of Pain Describe the Pain: Burning Pain Management and Medication Current Pain Management: Medication: Yes Is the Current Pain Management Adequate: Adequate How does your wound impact your activities of daily livingo Sleep: No Bathing: No Appetite: No Relationship With Others: No Bladder Continence: No Emotions: No Bowel Continence: No Work: No Toileting: No Drive: No Dressing: No Hobbies: No Engineer, maintenance) Signed: 11/20/2020 4:58:11 PM By: Baruch Gouty RN, BSN Entered  By: Baruch Gouty on 11/20/2020 13:56:21 -------------------------------------------------------------------------------- Patient/Caregiver Education Details Patient Name: Date of Service: Greg [name] [place] 10/10/2022andnbsp2:00 PM Medical Record Number: [id number] Patient Account Number: [id number] Date of Birth/Gender: Treating RN: Mar 31, 1950 (70 y.o. [name] Primary Care Physician: PCP, NO Other Clinician: Referring Physician: Treating Physician/Extender: [name] in Treatment: 6 Education Assessment Education Provided To: Patient Education Topics Provided Wound/Skin Impairment: Methods:  Explain/Verbal Responses: Reinforcements needed, State content correctly Electronic Signature(s) Signed: 11/20/2020 4:58:11 PM By: Baruch Gouty RN, BSN Entered By: Baruch Gouty on 11/20/2020 14:05:46 -------------------------------------------------------------------------------- Wound Assessment Details Patient Name: Date of Service: Greg [name] [place] 11/20/2020 2:00 PM Medical Record Number: [id number] Patient Account Number: [id number] Date of Birth/Sex: Treating RN: 1950-10-07 (70 y.o. [name] Primary Care Brittany Amirault: PCP, NO Other Clinician: Referring Gaynor Genco: Treating Kenniel Bergsma/Extender: [name] in Treatment: 6 Wound Status Wound Number: 1 Primary Etiology: Abrasion Wound Location: Scrotum Wound Status: Open Wounding Event: Shear/Friction Date Acquired: 06/15/2020 Weeks Of Treatment: 6 Clustered Wound: Yes Photos Wound Measurements Length: (cm) 4 Width: (cm) 4.5 Depth: (cm) 0.1 Clustered Quantity: 4 Area: (cm) 14.137 Volume: (cm) 1.414 % Reduction in Area: -60.7% % Reduction in Volume: -60.7% Epithelialization: Small (1-33%) Tunneling: No Undermining: No Wound Description Classification: Full Thickness Without Exposed Support Structures Wound Margin: Flat and Intact Exudate Amount: Medium Exudate Type: Sanguinous Exudate Color: red Foul Odor After Cleansing: No Slough/Fibrino No Wound Bed Granulation Amount: Large (67-100%) Exposed Structure Granulation Quality: Red Fascia Exposed: No Necrotic Amount: None Present (0%) Fat Layer (Subcutaneous Tissue) Exposed: Yes Tendon Exposed: No Muscle Exposed: No Joint Exposed: No Bone Exposed: No Treatment Notes Wound #1 (Scrotum) Cleanser Soap and Water Discharge Instruction: May shower and wash wound with dial antibacterial soap and water prior to dressing change. Peri-Wound Care Topical Primary Dressing KerraCel Ag Gelling Fiber Dressing, 4x5 in (silver alginate) Discharge  Instruction: Apply silver alginate to wound bed as instructed Secondary Dressing ABD Pad, 8x10 Discharge Instruction: Apply over primary dressing as directed. Secured With Mesh Underwear Compression Wrap Compression Stockings Environmental education officer) Signed: 11/20/2020 4:58:11 PM By: Baruch Gouty RN, BSN Signed: 11/22/2020 1:46:44 PM By: Sandre Kitty Entered By: Sandre Kitty on 11/20/2020 14:03:50 -------------------------------------------------------------------------------- Wound Assessment Details Patient Name: Date of Service: Greg [name] [place] 11/20/2020 2:00 PM Medical Record Number: [id number] Patient Account Number: [id number] Date of Birth/Sex: Treating RN: August 09, 1950 (70 y.o. [name] Primary Care Merton Wadlow: PCP, NO Other Clinician: Referring Ailis Rigaud: Treating Media Pizzini/Extender: [name] in Treatment: 6 Wound Status Wound Number: 2 Primary Etiology: Pressure Ulcer Wound Location: Posterior Scrotum Wound Status: Open Wounding Event: Gradually Appeared Date Acquired: 10/31/2020 Weeks Of Treatment: 1 Clustered Wound: No Photos Wound Measurements Length: (cm) 0.4 Width: (cm) 1.5 Depth: (cm) 0.1 Area: (cm) 0.471 Volume: (cm) 0.047 % Reduction in Area: 89.7% % Reduction in Volume: 89.8% Epithelialization: Medium (34-66%) Tunneling: No Undermining: No Wound Description Classification: Category/Stage III Wound Margin: Flat and Intact Exudate Amount: Small Exudate Type: Serosanguineous Exudate Color: red, brown Foul Odor After Cleansing: No Slough/Fibrino No Wound Bed Granulation Amount: Large (67-100%) Exposed Structure Granulation Quality: Red Fascia Exposed: No Necrotic Amount: None Present (0%) Fat Layer (Subcutaneous Tissue) Exposed: Yes Tendon Exposed: No Muscle Exposed: No Joint Exposed: No Bone Exposed: No Treatment Notes Wound #2 (Scrotum) Wound Laterality: Posterior Cleanser Soap and  Water Discharge Instruction: May shower and wash wound with dial antibacterial soap and water prior to dressing change. Peri-Wound Care Topical Primary Dressing KerraCel Ag Gelling Fiber  Dressing, 2x2 in (silver alginate) Discharge Instruction: Apply silver alginate to wound bed as instructed Secondary Dressing ABD Pad, 8x10 Discharge Instruction: Apply over primary dressing as directed. Secured With Mesh Underwear Compression Wrap Compression Stockings Environmental education officer) Signed: 11/20/2020 4:58:11 PM By: Baruch Gouty RN, BSN Signed: 11/22/2020 1:46:44 PM By: Sandre Kitty Entered By: Sandre Kitty on 11/20/2020 14:04:36 -------------------------------------------------------------------------------- Vitals Details Patient Name: Date of Service: Greg [name] [place] 11/20/2020 2:00 PM Medical Record Number: [id number] Patient Account Number: [id number] Date of Birth/Sex: Treating RN: 14-Aug-1950 (70 y.o. [name] Primary Care Nathaniel Yaden: PCP, NO Other Clinician: Referring Ayvah Caroll: Treating Mackinzie Vuncannon/Extender: [name] in Treatment: 6 Vital Signs Time Taken: 13:55 Temperature (F): 98.3 Height (in): 68 Pulse (bpm): 83 Source: Stated Respiratory Rate (breaths/min): 18 Weight (lbs): 135 Blood Pressure (mmHg): 143/93 Source: Stated Reference Range: 80 - 120 mg / dl Body Mass Index (BMI): 20.5 Electronic Signature(s) Signed: 11/20/2020 4:58:11 PM By: Baruch Gouty RN, BSN Entered By: Baruch Gouty on 11/20/2020 13:55:26

## 2020-12-05 ENCOUNTER — Encounter (HOSPITAL_BASED_OUTPATIENT_CLINIC_OR_DEPARTMENT_OTHER): Payer: Medicare Other | Admitting: Internal Medicine

## 2020-12-05 ENCOUNTER — Other Ambulatory Visit: Payer: Self-pay

## 2020-12-05 DIAGNOSIS — S3130XD Unspecified open wound of scrotum and testes, subsequent encounter: Secondary | ICD-10-CM

## 2020-12-05 DIAGNOSIS — S31103A Unspecified open wound of abdominal wall, right lower quadrant without penetration into peritoneal cavity, initial encounter: Secondary | ICD-10-CM | POA: Diagnosis not present

## 2020-12-05 DIAGNOSIS — K4090 Unilateral inguinal hernia, without obstruction or gangrene, not specified as recurrent: Secondary | ICD-10-CM

## 2020-12-12 ENCOUNTER — Other Ambulatory Visit: Payer: Self-pay

## 2020-12-12 ENCOUNTER — Encounter (HOSPITAL_BASED_OUTPATIENT_CLINIC_OR_DEPARTMENT_OTHER): Payer: Medicare Other | Attending: Internal Medicine | Admitting: Internal Medicine

## 2020-12-12 DIAGNOSIS — K4090 Unilateral inguinal hernia, without obstruction or gangrene, not specified as recurrent: Secondary | ICD-10-CM | POA: Insufficient documentation

## 2020-12-12 DIAGNOSIS — S3130XD Unspecified open wound of scrotum and testes, subsequent encounter: Secondary | ICD-10-CM | POA: Insufficient documentation

## 2020-12-12 DIAGNOSIS — X58XXXD Exposure to other specified factors, subsequent encounter: Secondary | ICD-10-CM | POA: Insufficient documentation

## 2020-12-14 NOTE — Progress Notes (Signed)
[name], [name] ([id number]) Visit Report for 12/12/2020 Arrival Information Details Patient Name: Date of Service: Greg [name] [facility] [place] 12/12/2020 10:15 A M Medical Record Number: [id number] Patient Account Number: [id number] Date of Birth/Sex: Treating RN: 08/27/1950 (70 y.o. Greg [name], [name] Primary Care Valora Norell: PCP, NO Other Clinician: Referring Dannel Rafter: Treating Saralynn Langhorst/Extender: [name] in Treatment: 9 Visit Information History Since Last Visit Added or deleted any medications: No Patient Arrived: Ambulatory Any new allergies or adverse reactions: No Arrival Time: 10:26 Had a fall or experienced change in No Accompanied By: self activities of daily living that may affect Transfer Assistance: None risk of falls: Patient Identification Verified: Yes Signs or symptoms of abuse/neglect since last visito No Secondary Verification Process Completed: Yes Hospitalized since last visit: No Patient Requires Transmission-Based Precautions: No Implantable device outside of the clinic excluding No Patient Has Alerts: Yes cellular tissue based products placed in the center since last visit: Has Dressing in Place as Prescribed: Yes Pain Present Now: No Electronic Signature(s) Signed: 12/14/2020 6:11:26 PM By: Deon Pilling RN, BSN Entered By: Deon Pilling on 12/12/2020 10:26:54 -------------------------------------------------------------------------------- Clinic Level of Care Assessment Details Patient Name: Date of Service: Greg [name] [facility] [place] 12/12/2020 10:15 A M Medical Record Number: [id number] Patient Account Number: [id number] Date of Birth/Sex: Treating RN: [birth year] (70 y.o. Greg [name] Primary Care Beau Vanduzer: PCP, NO Other Clinician: Referring Hulet Ehrmann: Treating Tully Burgo/Extender: [name] in Treatment: 9 Clinic Level of Care Assessment Items TOOL 4 Quantity Score X- 1 0 Use when only an EandM is performed on FOLLOW-UP visit ASSESSMENTS  - Nursing Assessment / Reassessment X- 1 10 Reassessment of Co-morbidities (includes updates in patient status) X- 1 5 Reassessment of Adherence to Treatment Plan ASSESSMENTS - Wound and Skin A ssessment / Reassessment []  - 0 Simple Wound Assessment / Reassessment - one wound X- 2 5 Complex Wound Assessment / Reassessment - multiple wounds X- 1 10 Dermatologic / Skin Assessment (not related to wound area) ASSESSMENTS - Focused Assessment []  - 0 Circumferential Edema Measurements - multi extremities X- 1 10 Nutritional Assessment / Counseling / Intervention []  - 0 Lower Extremity Assessment (monofilament, tuning fork, pulses) []  - 0 Peripheral Arterial Disease Assessment (using hand held doppler) ASSESSMENTS - Ostomy and/or Continence Assessment and Care []  - 0 Incontinence Assessment and Management []  - 0 Ostomy Care Assessment and Management (repouching, etc.) PROCESS - Coordination of Care []  - 0 Simple Patient / Family Education for ongoing care X- 1 20 Complex (extensive) Patient / Family Education for ongoing care X- 1 10 Staff obtains Programmer, systems, Records, T Results / Process Orders est X- 1 10 Staff telephones HHA, Nursing Homes / Clarify orders / etc []  - 0 Routine Transfer to another Facility (non-emergent condition) []  - 0 Routine Hospital Admission (non-emergent condition) []  - 0 New Admissions / Biomedical engineer / Ordering NPWT Apligraf, etc. , []  - 0 Emergency Hospital Admission (emergent condition) []  - 0 Simple Discharge Coordination X- 1 15 Complex (extensive) Discharge Coordination PROCESS - Special Needs []  - 0 Pediatric / Minor Patient Management []  - 0 Isolation Patient Management []  - 0 Hearing / Language / Visual special needs []  - 0 Assessment of Community assistance (transportation, D/C planning, etc.) []  - 0 Additional assistance / Altered mentation []  - 0 Support Surface(s) Assessment (bed, cushion, seat, etc.) INTERVENTIONS  - Wound Cleansing / Measurement []  - 0 Simple Wound Cleansing - one wound X- 2 5 Complex Wound Cleansing - multiple wounds X- 1 5 Wound  Imaging (photographs - any number of wounds) []  - 0 Wound Tracing (instead of photographs) []  - 0 Simple Wound Measurement - one wound X- 2 5 Complex Wound Measurement - multiple wounds INTERVENTIONS - Wound Dressings []  - 0 Small Wound Dressing one or multiple wounds X- 1 15 Medium Wound Dressing one or multiple wounds []  - 0 Large Wound Dressing one or multiple wounds []  - 0 Application of Medications - topical []  - 0 Application of Medications - injection INTERVENTIONS - Miscellaneous []  - 0 External ear exam []  - 0 Specimen Collection (cultures, biopsies, blood, body fluids, etc.) []  - 0 Specimen(s) / Culture(s) sent or taken to Lab for analysis []  - 0 Patient Transfer (multiple staff / Civil Service fast streamer / Similar devices) []  - 0 Simple Staple / Suture removal (25 or less) []  - 0 Complex Staple / Suture removal (26 or more) []  - 0 Hypo / Hyperglycemic Management (close monitor of Blood Glucose) []  - 0 Ankle / Brachial Index (ABI) - do not check if billed separately X- 1 5 Vital Signs Has the patient been seen at the hospital within the last three years: Yes Total Score: 145 Level Of Care: New/Established - Level 4 Electronic Signature(s) Signed: 12/14/2020 6:11:26 PM By: Deon Pilling RN, BSN Entered By: Deon Pilling on 12/12/2020 14:10:23 -------------------------------------------------------------------------------- Encounter Discharge Information Details Patient Name: Date of Service: Greg [name] [place] 12/12/2020 10:15 A M Medical Record Number: [id number] Patient Account Number: [id number] Date of Birth/Sex: Treating RN: 10/10/1950 (70 y.o. Greg [name] Primary Care Yaron Grasse: PCP, NO Other Clinician: Referring Jeanett Antonopoulos: Treating Kenny Stern/Extender: [name] in Treatment: 9 Encounter Discharge Information  Items Discharge Condition: Stable Ambulatory Status: Ambulatory Discharge Destination: Home Transportation: Private Auto Accompanied By: self Schedule Follow-up Appointment: Yes Clinical Summary of Care: Electronic Signature(s) Signed: 12/14/2020 6:11:26 PM By: Deon Pilling RN, BSN Entered By: Deon Pilling on 12/12/2020 11:01:37 -------------------------------------------------------------------------------- Lower Extremity Assessment Details Patient Name: Date of Service: Greg [name] [place] 12/12/2020 10:15 A M Medical Record Number: [id number] Patient Account Number: [id number] Date of Birth/Sex: Treating RN: 18-Jul-1950 (70 y.o. Greg [name] Primary Care Larene Ascencio: PCP, NO Other Clinician: Referring Bridget [place]: Treating Ceniya Fowers/Extender: [name] in Treatment: 9 Electronic Signature(s) Signed: 12/14/2020 6:11:26 PM By: Deon Pilling RN, BSN Entered By: Deon Pilling on 12/12/2020 10:27:22 -------------------------------------------------------------------------------- Multi Wound Chart Details Patient Name: Date of Service: Greg [name] [place] 12/12/2020 10:15 A M Medical Record Number: [id number] Patient Account Number: [id number] Date of Birth/Sex: Treating RN: 06-20-1950 (70 y.o. Greg [name] Primary Care Sony Schlarb: PCP, NO Other Clinician: Referring Keyerra Lamere: Treating Janyiah Silveri/Extender: [name] in Treatment: 9 Vital Signs Height(in): 68 Pulse(bpm): 91 Weight(lbs): 135 Blood Pressure(mmHg): 129/86 Body Mass Index(BMI): 21 Temperature(F): 98.4 Respiratory Rate(breaths/min): 20 Photos: [N/A:N/A] Scrotum Posterior Scrotum N/A Wound Location: Shear/Friction Gradually Appeared N/A Wounding Event: Abrasion Pressure Ulcer N/A Primary Etiology: 06/15/2020 10/31/2020 N/A Date Acquired: 9 5 N/A Weeks of Treatment: Open Open N/A Wound Status: Yes Yes N/A Clustered Wound: 1 4 N/A Clustered Quantity: 1x0.9x0.1 3.5x10x0.1  N/A Measurements L x W x D (cm) 0.707 27.489 N/A A (cm) : rea 0.071 2.749 N/A Volume (cm) : 92.00% -498.20% N/A % Reduction in A rea: 91.90% -498.90% N/A % Reduction in Volume: Full Thickness Without Exposed Category/Stage III N/A Classification: Support Structures Small Large N/A Exudate Amount: Sanguinous Serosanguineous N/A Exudate Type: red red, brown N/A Exudate Color: Flat and Intact Flat and Intact N/A Wound Margin: Large (67-100%) Large (67-100%) N/A Granulation  Amount: Red Red N/A Granulation Quality: None Present (0%) None Present (0%) N/A Necrotic Amount: Fat Layer (Subcutaneous Tissue): Yes Fat Layer (Subcutaneous Tissue): Yes N/A Exposed Structures: Fascia: No Fascia: No Tendon: No Tendon: No Muscle: No Muscle: No Joint: No Joint: No Bone: No Bone: No Large (67-100%) Small (1-33%) N/A Epithelialization: Treatment Notes Wound #1 (Scrotum) Cleanser Soap and Water Discharge Instruction: May shower and wash wound with dial antibacterial soap and water prior to dressing change. Peri-Wound Care Zinc Oxide Ointment 30g tube Discharge Instruction: Apply Zinc Oxide to periwound with each dressing change Topical Primary Dressing KerraCel Ag Gelling Fiber Dressing, 4x5 in (silver alginate) Discharge Instruction: Apply silver alginate to wound bed as instructed Secondary Dressing Woven Gauze Sponge, Non-Sterile 4x4 in Discharge Instruction: Apply over primary dressing as directed. ABD Pad, 8x10 Discharge Instruction: Apply over primary dressing as directed. CarboFLEX Odor Control Dressing, 4x4 in Discharge Instruction: Apply over primary dressing if available home health to use as directed. Secured With [employer], 4.5x3.1 (in/yd) Discharge Instruction: Secure with Kerlix as directed. fish netting Discharge Instruction: to hold ABD pad or kerlix in place. Compression Wrap Compression Stockings Add-Ons Wound #2 (Scrotum) Wound  Laterality: Posterior Cleanser Soap and Water Discharge Instruction: May shower and wash wound with dial antibacterial soap and water prior to dressing change. Peri-Wound Care Zinc Oxide Ointment 30g tube Discharge Instruction: Apply Zinc Oxide to periwound with each dressing change Topical Primary Dressing KerraCel Ag Gelling Fiber Dressing, 4x5 in (silver alginate) Discharge Instruction: Apply silver alginate to wound bed as instructed Secondary Dressing Woven Gauze Sponge, Non-Sterile 4x4 in Discharge Instruction: Apply over primary dressing as directed. ABD Pad, 8x10 Discharge Instruction: Apply over primary dressing as directed. CarboFLEX Odor Control Dressing, 4x4 in Discharge Instruction: Apply over primary dressing if available home health to use as directed. Secured With [employer], 4.5x3.1 (in/yd) Discharge Instruction: Secure with Kerlix as directed. fish netting Discharge Instruction: to hold ABD pad or kerlix in place. Compression Wrap Compression Stockings Add-Ons Electronic Signature(s) Signed: 12/12/2020 1:18:40 PM By: Kalman Shan DO Signed: 12/14/2020 6:11:26 PM By: Deon Pilling RN, BSN Entered By: Kalman Shan on 12/12/2020 13:07:15 -------------------------------------------------------------------------------- Multi-Disciplinary Care Plan Details Patient Name: Date of Service: Greg [name] [place] 12/12/2020 10:15 A M Medical Record Number: [id number] Patient Account Number: [id number] Date of Birth/Sex: Treating RN: [birth year] (70 y.o. Greg [name] Primary Care Sallyanne Birkhead: PCP, NO Other Clinician: Referring Latrail Pounders: Treating Oluwatoni Rotunno/Extender: [name] in Treatment: 9 Multidisciplinary Care Plan reviewed with physician Active Inactive Wound/Skin Impairment Nursing Diagnoses: Impaired tissue integrity Goals: Patient/caregiver will verbalize understanding of skin care regimen Date Initiated: 10/09/2020 Target  Resolution Date: 01/05/2021 Goal Status: Active Ulcer/skin breakdown will have a volume reduction of 30% by week 4 Date Initiated: 10/09/2020 Date Inactivated: 11/07/2020 Target Resolution Date: 11/06/2020 Unmet Reason: pt refuses surgical Goal Status: Unmet reduction of hernia Ulcer/skin breakdown will have a volume reduction of 50% by week 8 Date Initiated: 11/07/2020 Date Inactivated: 12/05/2020 Target Resolution Date: 12/05/2020 Unmet Reason: wound measuring Goal Status: Unmet larger. Interventions: Assess patient/caregiver ability to obtain necessary supplies Assess patient/caregiver ability to perform ulcer/skin care regimen upon admission and as needed Assess ulceration(s) every visit Provide education on ulcer and skin care Treatment Activities: Topical wound management initiated : 10/09/2020 Notes: Electronic Signature(s) Signed: 12/14/2020 6:11:26 PM By: Deon Pilling RN, BSN Entered By: Deon Pilling on 12/12/2020 10:40:05 -------------------------------------------------------------------------------- Pain Assessment Details Patient Name: Date of Service: Greg [name] [place] 12/12/2020 10:15 A M  Medical Record Number: [id number] Patient Account Number: [id number] Date of Birth/Sex: Treating RN: Jun 27, 1950 (70 y.o. Greg [name] Primary Care Wilmon Conover: PCP, NO Other Clinician: Referring Treyshaun Keatts: Treating Elizah Lydon/Extender: [name] in Treatment: 9 Active Problems Location of Pain Severity and Description of Pain Patient Has Paino No Site Locations Rate the pain. Rate the pain. Current Pain Level: 0 Pain Management and Medication Current Pain Management: Medication: No Cold Application: No Rest: No Massage: No Activity: No T.E.N.S.: No Heat Application: No Leg drop or elevation: No Is the Current Pain Management Adequate: Adequate How does your wound impact your activities of daily livingo Sleep: No Bathing: No Appetite: No Relationship  With Others: No Bladder Continence: No Emotions: No Bowel Continence: No Work: No Toileting: No Drive: No Dressing: No Hobbies: No Engineer, maintenance) Signed: 12/14/2020 6:11:26 PM By: Deon Pilling RN, BSN Entered By: Deon Pilling on 12/12/2020 10:27:10 -------------------------------------------------------------------------------- Patient/Caregiver Education Details Patient Name: Date of Service: Greg [name] [place] 11/1/2022andnbsp10:15 A M Medical Record Number: [id number] Patient Account Number: [id number] Date of Birth/Gender: Treating RN: Dec 14, 1950 (70 y.o. Greg [name] Primary Care Physician: PCP, NO Other Clinician: Referring Physician: Treating Physician/Extender: [name] in Treatment: 9 Education Assessment Education Provided To: Patient Education Topics Provided Wound/Skin Impairment: Handouts: Caring for Your Ulcer Methods: Explain/Verbal Responses: Reinforcements needed Electronic Signature(s) Signed: 12/14/2020 6:11:26 PM By: Deon Pilling RN, BSN Entered By: Deon Pilling on 12/12/2020 10:40:21 -------------------------------------------------------------------------------- Wound Assessment Details Patient Name: Date of Service: Greg [name] [place] 12/12/2020 10:15 A M Medical Record Number: [id number] Patient Account Number: [id number] Date of Birth/Sex: Treating RN: [birth year] (70 y.o. Greg [name] Primary Care Jaishon Krisher: PCP, NO Other Clinician: Referring Linder Prajapati: Treating Anorah Trias/Extender: [name] in Treatment: 9 Wound Status Wound Number: 1 Primary Etiology: Abrasion Wound Location: Scrotum Wound Status: Open Wounding Event: Shear/Friction Date Acquired: 06/15/2020 Weeks Of Treatment: 9 Clustered Wound: Yes Photos Wound Measurements Length: (cm) Width: (cm) Depth: (cm) Clustered Quantity: Area: (cm) Volume: (cm) 1 % Reduction in Area: 92% 0.9 % Reduction in Volume: 91.9% 0.1  Epithelialization: Large (67-100%) 1 Tunneling: No 0.707 Undermining: No 0.071 Wound Description Classification: Full Thickness Without Exposed Support Structures Wound Margin: Flat and Intact Exudate Amount: Small Exudate Type: Sanguinous Exudate Color: red Foul Odor After Cleansing: No Slough/Fibrino No Wound Bed Granulation Amount: Large (67-100%) Exposed Structure Granulation Quality: Red Fascia Exposed: No Necrotic Amount: None Present (0%) Fat Layer (Subcutaneous Tissue) Exposed: Yes Tendon Exposed: No Muscle Exposed: No Joint Exposed: No Bone Exposed: No Treatment Notes Wound #1 (Scrotum) Cleanser Soap and Water Discharge Instruction: May shower and wash wound with dial antibacterial soap and water prior to dressing change. Peri-Wound Care Zinc Oxide Ointment 30g tube Discharge Instruction: Apply Zinc Oxide to periwound with each dressing change Topical Primary Dressing KerraCel Ag Gelling Fiber Dressing, 4x5 in (silver alginate) Discharge Instruction: Apply silver alginate to wound bed as instructed Secondary Dressing Woven Gauze Sponge, Non-Sterile 4x4 in Discharge Instruction: Apply over primary dressing as directed. ABD Pad, 8x10 Discharge Instruction: Apply over primary dressing as directed. CarboFLEX Odor Control Dressing, 4x4 in Discharge Instruction: Apply over primary dressing if available home health to use as directed. Secured With [employer], 4.5x3.1 (in/yd) Discharge Instruction: Secure with Kerlix as directed. fish netting Discharge Instruction: to hold ABD pad or kerlix in place. Compression Wrap Compression Stockings Add-Ons Electronic Signature(s) Signed: 12/12/2020 4:07:03 PM By: Baruch Gouty RN, BSN Signed: 12/14/2020 6:11:26 PM By: Deon Pilling RN, BSN Entered By: Johna Roles,  Linda on 12/12/2020 10:37:39 -------------------------------------------------------------------------------- Wound Assessment Details Patient  Name: Date of Service: Greg [name] [facility] [place] 12/12/2020 10:15 A M Medical Record Number: [id number] Patient Account Number: [id number] Date of Birth/Sex: Treating RN: October 03, 1950 (70 y.o. Greg [name] Primary Care Debbora [name]: PCP, NO Other Clinician: Referring Garnie Borchardt: Treating Latrelle Bazar/Extender: [name] in Treatment: 9 Wound Status Wound Number: 2 Primary Etiology: Pressure Ulcer Wound Location: Posterior Scrotum Wound Status: Open Wounding Event: Gradually Appeared Date Acquired: 10/31/2020 Weeks Of Treatment: 5 Clustered Wound: Yes Photos Wound Measurements Length: (cm) 3.5 Width: (cm) 10 Depth: (cm) 0.1 Clustered Quantity: 4 Area: (cm) 27.489 Volume: (cm) 2.749 % Reduction in Area: -498.2% % Reduction in Volume: -498.9% Epithelialization: Small (1-33%) Tunneling: No Undermining: No Wound Description Classification: Category/Stage III Wound Margin: Flat and Intact Exudate Amount: Large Exudate Type: Serosanguineous Exudate Color: red, brown Foul Odor After Cleansing: No Slough/Fibrino No Wound Bed Granulation Amount: Large (67-100%) Exposed Structure Granulation Quality: Red Fascia Exposed: No Necrotic Amount: None Present (0%) Fat Layer (Subcutaneous Tissue) Exposed: Yes Tendon Exposed: No Muscle Exposed: No Joint Exposed: No Bone Exposed: No Treatment Notes Wound #2 (Scrotum) Wound Laterality: Posterior Cleanser Soap and Water Discharge Instruction: May shower and wash wound with dial antibacterial soap and water prior to dressing change. Peri-Wound Care Zinc Oxide Ointment 30g tube Discharge Instruction: Apply Zinc Oxide to periwound with each dressing change Topical Primary Dressing KerraCel Ag Gelling Fiber Dressing, 4x5 in (silver alginate) Discharge Instruction: Apply silver alginate to wound bed as instructed Secondary Dressing Woven Gauze Sponge, Non-Sterile 4x4 in Discharge Instruction: Apply over primary dressing as  directed. ABD Pad, 8x10 Discharge Instruction: Apply over primary dressing as directed. CarboFLEX Odor Control Dressing, 4x4 in Discharge Instruction: Apply over primary dressing if available home health to use as directed. Secured With [employer], 4.5x3.1 (in/yd) Discharge Instruction: Secure with Kerlix as directed. fish netting Discharge Instruction: to hold ABD pad or kerlix in place. Compression Wrap Compression Stockings Add-Ons Electronic Signature(s) Signed: 12/12/2020 4:07:03 PM By: Baruch Gouty RN, BSN Signed: 12/14/2020 6:11:26 PM By: Deon Pilling RN, BSN Entered By: Baruch Gouty on 12/12/2020 10:39:22 -------------------------------------------------------------------------------- Vitals Details Patient Name: Date of Service: Greg [name] [place] 12/12/2020 10:15 A M Medical Record Number: [id number] Patient Account Number: [id number] Date of Birth/Sex: Treating RN: Oct 16, 1950 (70 y.o. Greg [name] Primary Care Areej Tayler: PCP, NO Other Clinician: Referring Wilhelmine Krogstad: Treating Matthias Bogus/Extender: [name] in Treatment: 9 Vital Signs Time Taken: 10:25 Temperature (F): 98.4 Height (in): 68 Pulse (bpm): 91 Weight (lbs): 135 Respiratory Rate (breaths/min): 20 Body Mass Index (BMI): 20.5 Blood Pressure (mmHg): 129/86 Reference Range: 80 - 120 mg / dl Electronic Signature(s) Signed: 12/14/2020 6:11:26 PM By: Deon Pilling RN, BSN Entered By: Deon Pilling on 12/12/2020 10:27:41

## 2020-12-14 NOTE — Progress Notes (Signed)
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 without obstruction or gangrene, not  specified as recurrent Quantity: 1 Electronic Signature(s) Signed: 12/12/2020 6:36:59 PM By: Greg Shan DO Signed: 12/14/2020 6:11:26 PM By: Deon Pilling RN, BSN Previous Signature: 12/12/2020 1:18:40 PM Version By: Greg Shan DO Entered By: Deon Pilling on 12/12/2020 14:10:33

## 2020-12-19 ENCOUNTER — Other Ambulatory Visit: Payer: Self-pay

## 2020-12-19 ENCOUNTER — Encounter (HOSPITAL_BASED_OUTPATIENT_CLINIC_OR_DEPARTMENT_OTHER): Payer: Medicare Other | Admitting: Internal Medicine

## 2020-12-19 DIAGNOSIS — S3130XD Unspecified open wound of scrotum and testes, subsequent encounter: Secondary | ICD-10-CM | POA: Diagnosis not present

## 2020-12-19 DIAGNOSIS — K4090 Unilateral inguinal hernia, without obstruction or gangrene, not specified as recurrent: Secondary | ICD-10-CM
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
## 2020-12-20 NOTE — Progress Notes (Signed)
Greg, [name] ([id number]) Visit Report for 12/19/2020 Arrival Information Details Patient Name: Date of Service: Greg [name] [facility] [place] 12/19/2020 10:00 A M Medical Record Number: [id number] Patient Account Number: [id number] Date of Birth/Sex: Treating RN: [birth year] (70 y.o. Greg [name] Primary Care Greg [name]: PCP, NO Other Clinician: Referring Greg [name]: Treating Greg [name]/Extender: Greg [name] in Treatment: 10 Visit Information History Since Last Visit Added or deleted any medications: Yes Patient Arrived: Cane Any new allergies or adverse reactions: No Arrival Time: 10:14 Had a fall or experienced change in No Accompanied By: self activities of daily living that may affect Transfer Assistance: None risk of falls: Patient Identification Verified: Yes Signs or symptoms of abuse/neglect since last visito No Secondary Verification Process Completed: Yes Hospitalized since last visit: No Patient Requires Transmission-Based Precautions: No Implantable device outside of the clinic excluding No Patient Has Alerts: Yes cellular tissue based products placed in the center since last visit: Has Dressing in Place as Prescribed: Yes Pain Present Now: Yes Electronic Signature(s) Signed: 12/19/2020 5:12:04 PM By: Greg Gouty RN, BSN Entered By: Greg [name] on 12/19/2020 10:15:16 -------------------------------------------------------------------------------- Clinic Level of Care Assessment Details Patient Name: Date of Service: Greg [name] [facility] [place] 12/19/2020 10:00 A M Medical Record Number: [id number] Patient Account Number: [id number] Date of Birth/Sex: Treating RN: March 30, 1950 (70 y.o. Greg [name] Primary Care Greg [name]: PCP, NO Other Clinician: Referring Greg [name]: Treating Greg [name]/Extender: Greg [name] in Treatment: 10 Clinic Level of Care Assessment Items TOOL 4 Quantity Score []  - 0 Use when only an EandM is performed on FOLLOW-UP  visit ASSESSMENTS - Nursing Assessment / Reassessment X- 1 10 Reassessment of Co-morbidities (includes updates in patient status) X- 1 5 Reassessment of Adherence to Treatment Plan ASSESSMENTS - Wound and Skin A ssessment / Reassessment []  - 0 Simple Wound Assessment / Reassessment - one wound X- 2 5 Complex Wound Assessment / Reassessment - multiple wounds []  - 0 Dermatologic / Skin Assessment (not related to wound area) ASSESSMENTS - Focused Assessment []  - 0 Circumferential Edema Measurements - multi extremities []  - 0 Nutritional Assessment / Counseling / Intervention []  - 0 Lower Extremity Assessment (monofilament, tuning fork, pulses) []  - 0 Peripheral Arterial Disease Assessment (using hand held doppler) ASSESSMENTS - Ostomy and/or Continence Assessment and Care []  - 0 Incontinence Assessment and Management []  - 0 Ostomy Care Assessment and Management (repouching, etc.) PROCESS - Coordination of Care X - Simple Patient / Family Education for ongoing care 1 15 []  - 0 Complex (extensive) Patient / Family Education for ongoing care X- 1 10 Staff obtains Programmer, systems, Records, T Results / Process Orders est X- 1 10 Staff telephones HHA, Nursing Homes / Clarify orders / etc []  - 0 Routine Transfer to another Facility (non-emergent condition) []  - 0 Routine Hospital Admission (non-emergent condition) []  - 0 New Admissions / Biomedical engineer / Ordering NPWT Apligraf, etc. , []  - 0 Emergency Hospital Admission (emergent condition) X- 1 10 Simple Discharge Coordination []  - 0 Complex (extensive) Discharge Coordination PROCESS - Special Needs []  - 0 Pediatric / Minor Patient Management []  - 0 Isolation Patient Management []  - 0 Hearing / Language / Visual special needs []  - 0 Assessment of Community assistance (transportation, D/C planning, etc.) []  - 0 Additional assistance / Altered mentation []  - 0 Support Surface(s) Assessment (bed, cushion, seat,  etc.) INTERVENTIONS - Wound Cleansing / Measurement []  - 0 Simple Wound Cleansing - one wound X- 2 5 Complex Wound Cleansing - multiple wounds X- 1 5  Wound Imaging (photographs - any number of wounds) []  - 0 Wound Tracing (instead of photographs) []  - 0 Simple Wound Measurement - one wound X- 2 5 Complex Wound Measurement - multiple wounds INTERVENTIONS - Wound Dressings X - Small Wound Dressing one or multiple wounds 2 10 []  - 0 Medium Wound Dressing one or multiple wounds []  - 0 Large Wound Dressing one or multiple wounds X- 1 5 Application of Medications - topical []  - 0 Application of Medications - injection INTERVENTIONS - Miscellaneous []  - 0 External ear exam []  - 0 Specimen Collection (cultures, biopsies, blood, body fluids, etc.) []  - 0 Specimen(s) / Culture(s) sent or taken to Lab for analysis []  - 0 Patient Transfer (multiple staff / Civil Service fast streamer / Similar devices) []  - 0 Simple Staple / Suture removal (25 or less) []  - 0 Complex Staple / Suture removal (26 or more) []  - 0 Hypo / Hyperglycemic Management (close monitor of Blood Glucose) []  - 0 Ankle / Brachial Index (ABI) - do not check if billed separately X- 1 5 Vital Signs Has the patient been seen at the hospital within the last three years: Yes Total Score: 125 Level Of Care: New/Established - Level 4 Electronic Signature(s) Signed: 12/19/2020 5:12:04 PM By: Greg Gouty RN, BSN Entered By: Greg [name] on 12/19/2020 10:31:30 -------------------------------------------------------------------------------- Encounter Discharge Information Details Patient Name: Date of Service: Greg [name] [place] 12/19/2020 10:00 A M Medical Record Number: [id number] Patient Account Number: [id number] Date of Birth/Sex: Treating RN: 23-Apr-1950 (70 y.o. Greg [name] Primary Care Greg [name]: PCP, NO Other Clinician: Referring Greg [name]: Treating Greg [name]/Extender: Greg [name] in Treatment:  10 Encounter Discharge Information Items Discharge Condition: Stable Ambulatory Status: Cane Discharge Destination: Home Transportation: Private Auto Accompanied By: sister Schedule Follow-up Appointment: Yes Clinical Summary of Care: Patient Declined Electronic Signature(s) Signed: 12/19/2020 5:12:04 PM By: Greg Gouty RN, BSN Entered By: Greg [name] on 12/19/2020 10:48:07 -------------------------------------------------------------------------------- Multi Wound Chart Details Patient Name: Date of Service: Greg [name] [place] 12/19/2020 10:00 A M Medical Record Number: [id number] Patient Account Number: [id number] Date of Birth/Sex: Treating RN: May 08, 1950 (70 y.o. [name] Primary Care Jerrol Helmers: PCP, NO Other Clinician: Referring Hyun Marsalis: Treating Tyjah Hai/Extender: Greg [name] in Treatment: 10 Vital Signs Height(in): 68 Pulse(bpm): 89 Weight(lbs): 135 Blood Pressure(mmHg): 134/83 Body Mass Index(BMI): 21 Temperature(F): 98.2 Respiratory Rate(breaths/min): 18 Photos: [1:Scrotum] [2:Posterior Scrotum] [N/A:N/A N/A] Wound Location: [1:Shear/Friction] [2:Gradually Appeared] [N/A:N/A] Wounding Event: [1:Abrasion] [2:Pressure Ulcer] [N/A:N/A] Primary Etiology: [1:06/15/2020] [2:10/31/2020] [N/A:N/A] Date Acquired: [1:10] [2:6] [N/A:N/A] Weeks of Treatment: [1:Open] [2:Open] [N/A:N/A] Wound Status: [1:Yes] [2:Yes] [N/A:N/A] Clustered Wound: [1:1] [2:4] [N/A:N/A] Clustered Quantity: [1:0.2x1.3x0.1] [2:0.8x0.7x0.1] [N/A:N/A] Measurements L x W x D (cm) [1:0.204] [2:0.44] [N/A:N/A] A (cm) : rea [1:0.02] [2:0.044] [N/A:N/A] Volume (cm) : [1:97.70%] [2:90.40%] [N/A:N/A] % Reduction in A rea: [1:97.70%] [2:90.40%] [N/A:N/A] % Reduction in Volume: [1:Full Thickness Without Exposed] [2:Category/Stage III] [N/A:N/A] Classification: [1:Support Structures Small] [2:Medium] [N/A:N/A] Exudate Amount: [1:Serosanguineous] [2:Serosanguineous] [N/A:N/A] Exudate  Type: [1:red, brown] [2:red, brown] [N/A:N/A] Exudate Color: [1:Flat and Intact] [2:Flat and Intact] [N/A:N/A] Wound Margin: [1:Large (67-100%)] [2:Large (67-100%)] [N/A:N/A] Granulation Amount: [1:Red] [2:Red] [N/A:N/A] Granulation Quality: [1:None Present (0%)] [2:None Present (0%)] [N/A:N/A] Necrotic Amount: [1:Fat Layer (Subcutaneous Tissue): Yes Fat Layer (Subcutaneous Tissue): Yes N/A] Exposed Structures: [1:Fascia: No Tendon: No Muscle: No Joint: No Bone: No Large (67-100%)] [2:Fascia: No Tendon: No Muscle: No Joint: No Bone: No Small (1-33%)] [N/A:N/A] Treatment Notes Electronic Signature(s) Signed: 12/19/2020 10:45:14 AM By: Kalman Shan DO Signed: 12/20/2020 5:29:58  PM By: Deon Pilling RN, BSN Entered By: Kalman Shan on 12/19/2020 10:35:23 -------------------------------------------------------------------------------- Multi-Disciplinary Care Plan Details Patient Name: Date of Service: Greg [name] [facility] [place] 12/19/2020 10:00 A M Medical Record Number: [id number] Patient Account Number: [id number] Date of Birth/Sex: Treating RN: 08/24/1950 (70 y.o. Greg [name] Primary Care Kiran Carline: PCP, NO Other Clinician: Referring Macon Sandiford: Treating Jethro Radke/Extender: Greg [name] in Treatment: 10 Multidisciplinary Care Plan reviewed with physician Active Inactive Wound/Skin Impairment Nursing Diagnoses: Impaired tissue integrity Goals: Patient/caregiver will verbalize understanding of skin care regimen Date Initiated: 10/09/2020 Target Resolution Date: 01/05/2021 Goal Status: Active Ulcer/skin breakdown will have a volume reduction of 30% by week 4 Date Initiated: 10/09/2020 Date Inactivated: 11/07/2020 Target Resolution Date: 11/06/2020 Unmet Reason: pt refuses surgical Goal Status: Unmet reduction of hernia Ulcer/skin breakdown will have a volume reduction of 50% by week 8 Date Initiated: 11/07/2020 Date Inactivated: 12/05/2020 Target Resolution Date:  12/05/2020 Unmet Reason: wound measuring Unmet Reason: wound measuring Goal Status: Unmet larger. Interventions: Assess patient/caregiver ability to obtain necessary supplies Assess patient/caregiver ability to perform ulcer/skin care regimen upon admission and as needed Assess ulceration(s) every visit Provide education on ulcer and skin care Treatment Activities: Topical wound management initiated : 10/09/2020 Notes: Electronic Signature(s) Signed: 12/19/2020 5:12:04 PM By: Greg Gouty RN, BSN Entered By: Greg [name] on 12/19/2020 10:34:02 -------------------------------------------------------------------------------- Pain Assessment Details Patient Name: Date of Service: Greg [name] [place] 12/19/2020 10:00 A M Medical Record Number: [id number] Patient Account Number: [id number] Date of Birth/Sex: Treating RN: August 12, 1950 (71 y.o. Greg [name] Primary Care Kebron Pulse: PCP, NO Other Clinician: Referring Jordyn Doane: Treating Orley Lawry/Extender: Greg [name] in Treatment: 10 Active Problems Location of Pain Severity and Description of Pain Patient Has Paino Yes Site Locations Pain Location: Generalized Pain With Dressing Change: Yes Duration of the Pain. Constant / Intermittento Intermittent Rate the pain. Current Pain Level: 6 Least Pain Level: 2 Character of Pain Describe the Pain: Aching, Other: itching Pain Management and Medication Current Pain Management: Medication: Yes Is the Current Pain Management Adequate: Adequate How does your wound impact your activities of daily livingo Sleep: No Bathing: No Appetite: No Relationship With Others: No Bladder Continence: No Emotions: No Bowel Continence: No Work: No Toileting: No Drive: No Dressing: No Hobbies: No Electronic Signature(s) Signed: 12/19/2020 5:12:04 PM By: Greg Gouty RN, BSN Entered By: Greg [name] on 12/19/2020  10:16:42 -------------------------------------------------------------------------------- Patient/Caregiver Education Details Patient Name: Date of Service: Greg [name] [place] 11/8/2022andnbsp10:00 A M Medical Record Number: [id number] Patient Account Number: [id number] Date of Birth/Gender: Treating RN: Sep 03, 1950 (70 y.o. Greg [name] Primary Care Physician: PCP, NO Other Clinician: Referring Physician: Treating Physician/Extender: Greg [name] in Treatment: 10 Education Assessment Education Provided To: Patient Education Topics Provided Wound/Skin Impairment: Methods: Explain/Verbal Responses: Reinforcements needed, State content correctly Electronic Signature(s) Signed: 12/19/2020 5:12:04 PM By: Greg Gouty RN, BSN Entered By: Greg [name] on 12/19/2020 10:27:52 -------------------------------------------------------------------------------- Wound Assessment Details Patient Name: Date of Service: Greg [name] [place] 12/19/2020 10:00 A M Medical Record Number: [id number] Patient Account Number: [id number] Date of Birth/Sex: Treating RN: 10/16/1950 (70 y.o. Greg [name] Primary Care Keniya Schlotterbeck: PCP, NO Other Clinician: Referring Ryenne Lynam: Treating Natally Ribera/Extender: Greg [name] in Treatment: 10 Wound Status Wound Number: 1 Primary Etiology: Abrasion Wound Location: Scrotum Wound Status: Open Wounding Event: Shear/Friction Date Acquired: 06/15/2020 Weeks Of Treatment: 10 Clustered Wound: Yes Photos Wound Measurements Length: (cm) 0.2 Width: (cm) 1.3 Depth: (cm) 0.1 Clustered Quantity: 1 Area: (cm) 0.204 Volume: (cm) 0.02 %  Reduction in Area: 97.7% % Reduction in Volume: 97.7% Epithelialization: Large (67-100%) Tunneling: No Undermining: No Wound Description Classification: Full Thickness Without Exposed Support Structures Wound Margin: Flat and Intact Exudate Amount: Small Exudate Type: Serosanguineous Exudate Color:  red, brown Foul Odor After Cleansing: No Slough/Fibrino No Wound Bed Granulation Amount: Large (67-100%) Exposed Structure Granulation Quality: Red Fascia Exposed: No Necrotic Amount: None Present (0%) Fat Layer (Subcutaneous Tissue) Exposed: Yes Tendon Exposed: No Muscle Exposed: No Joint Exposed: No Bone Exposed: No Treatment Notes Wound #1 (Scrotum) Cleanser Soap and Water Discharge Instruction: May shower and wash wound with dial antibacterial soap and water prior to dressing change. Peri-Wound Care Zinc Oxide Ointment 30g tube Discharge Instruction: Apply Zinc Oxide to periwound with each dressing change Topical Primary Dressing KerraCel Ag Gelling Fiber Dressing, 4x5 in (silver alginate) Discharge Instruction: Apply silver alginate to wound bed as instructed Secondary Dressing Woven Gauze Sponge, Non-Sterile 4x4 in Discharge Instruction: Apply over primary dressing as directed. ABD Pad, 8x10 Discharge Instruction: Apply over primary dressing as directed. Secured With [employer], 4.5x3.1 (in/yd) Discharge Instruction: Secure with Kerlix as directed. fish netting Discharge Instruction: to hold ABD pad or kerlix in place. Compression Wrap Compression Stockings Add-Ons Electronic Signature(s) Signed: 12/19/2020 5:12:04 PM By: Greg Gouty RN, BSN Entered By: Greg [name] on 12/19/2020 10:25:34 -------------------------------------------------------------------------------- Wound Assessment Details Patient Name: Date of Service: Greg [name] [place] 12/19/2020 10:00 A M Medical Record Number: [id number] Patient Account Number: [id number] Date of Birth/Sex: Treating RN: [birth year] (70 y.o. Greg [name] Primary Care Alylah Blakney: PCP, NO Other Clinician: Referring Staton Markey: Treating Zidan Helget/Extender: Greg [name] in Treatment: 10 Wound Status Wound Number: 2 Primary Etiology: Pressure Ulcer Wound Location: Posterior Scrotum Wound Status:  Open Wounding Event: Gradually Appeared Date Acquired: 10/31/2020 Weeks Of Treatment: 6 Clustered Wound: Yes Photos Wound Measurements Length: (cm) 0.8 Width: (cm) 0.7 Depth: (cm) 0.1 Clustered Quantity: 4 Area: (cm) 0.44 Volume: (cm) 0.044 % Reduction in Area: 90.4% % Reduction in Volume: 90.4% Epithelialization: Small (1-33%) Tunneling: No Undermining: No Wound Description Classification: Category/Stage III Wound Margin: Flat and Intact Exudate Amount: Medium Exudate Type: Serosanguineous Exudate Color: red, brown Foul Odor After Cleansing: No Slough/Fibrino No Wound Bed Granulation Amount: Large (67-100%) Exposed Structure Granulation Quality: Red Fascia Exposed: No Necrotic Amount: None Present (0%) Fat Layer (Subcutaneous Tissue) Exposed: Yes Tendon Exposed: No Muscle Exposed: No Joint Exposed: No Bone Exposed: No Treatment Notes Wound #2 (Scrotum) Wound Laterality: Posterior Cleanser Soap and Water Discharge Instruction: May shower and wash wound with dial antibacterial soap and water prior to dressing change. Peri-Wound Care Zinc Oxide Ointment 30g tube Discharge Instruction: Apply Zinc Oxide to periwound with each dressing change Topical Primary Dressing KerraCel Ag Gelling Fiber Dressing, 4x5 in (silver alginate) Discharge Instruction: Apply silver alginate to wound bed as instructed Secondary Dressing Woven Gauze Sponge, Non-Sterile 4x4 in Discharge Instruction: Apply over primary dressing as directed. ABD Pad, 8x10 Discharge Instruction: Apply over primary dressing as directed. CarboFLEX Odor Control Dressing, 4x4 in Discharge Instruction: Apply over primary dressing if available home health to use as directed. Secured With [employer], 4.5x3.1 (in/yd) Discharge Instruction: Secure with Kerlix as directed. fish netting Discharge Instruction: to hold ABD pad or kerlix in place. Compression Wrap Compression Stockings Add-Ons Electronic  Signature(s) Signed: 12/19/2020 5:12:04 PM By: Greg Gouty RN, BSN Entered By: Greg [name] on 12/19/2020 10:26:35 -------------------------------------------------------------------------------- Vitals Details Patient Name: Date of Service: Greg [name] [place] 12/19/2020 10:00 A M Medical Record Number:  [id number] Patient Account Number: [id number] Date of Birth/Sex: Treating RN: 01/03/1951 (70 y.o. Greg [name] Primary Care Lonie Rummell: PCP, NO Other Clinician: Referring Ewen Varnell: Treating Camri Molloy/Extender: Greg [name] in Treatment: 10 Vital Signs Time Taken: 10:18 Temperature (F): 98.2 Height (in): 68 Pulse (bpm): 89 Source: Stated Respiratory Rate (breaths/min): 18 Weight (lbs): 135 Blood Pressure (mmHg): 134/83 Source: Stated Reference Range: 80 - 120 mg / dl Body Mass Index (BMI): 20.5 Electronic Signature(s) Signed: 12/19/2020 5:12:04 PM By: Greg Gouty RN, BSN Entered By: Greg [name] on 12/19/2020 10:18:58

## 2020-12-26 ENCOUNTER — Other Ambulatory Visit: Payer: Self-pay

## 2020-12-26 ENCOUNTER — Encounter (HOSPITAL_BASED_OUTPATIENT_CLINIC_OR_DEPARTMENT_OTHER): Payer: Medicare Other | Admitting: Internal Medicine

## 2020-12-26 DIAGNOSIS — K4090 Unilateral inguinal hernia, without obstruction or gangrene, not specified as recurrent: Secondary | ICD-10-CM | POA: Diagnosis not present

## 2020-12-26 DIAGNOSIS — S3130XD Unspecified open wound of scrotum and testes, subsequent encounter: Secondary | ICD-10-CM

## 2020-12-26 NOTE — Progress Notes (Signed)
RYANE, CANAVAN ([id number]) Visit Report for 12/26/2020 Arrival Information Details Patient Name: Date of Service: Greg [name] [facility] [place] 12/26/2020 9:30 A M Medical Record Number: [id number] Patient Account Number: [id number] Date of Birth/Sex: Treating RN: 03/17/1950 (70 y.o. Greg [name] Primary Care Tabbatha Bordelon: PCP, NO Other Clinician: Referring Wesly Whisenant: Treating Zelda Reames/Extender: [name] in Treatment: 11 Visit Information History Since Last Visit Added or deleted any medications: No Patient Arrived: Cane Any new allergies or adverse reactions: No Arrival Time: 09:27 Had a fall or experienced change in No Accompanied By: sister activities of daily living that may affect Transfer Assistance: None risk of falls: Patient Identification Verified: Yes Signs or symptoms of abuse/neglect since last visito No Secondary Verification Process Completed: Yes Hospitalized since last visit: No Patient Requires Transmission-Based Precautions: No Implantable device outside of the clinic excluding No Patient Has Alerts: Yes cellular tissue based products placed in the center since last visit: Has Dressing in Place as Prescribed: Yes Pain Present Now: Yes Electronic Signature(s) Signed: 12/26/2020 5:46:53 PM By: Baruch Gouty RN, BSN Entered By: Baruch Gouty on 12/26/2020 61:60:73 -------------------------------------------------------------------------------- Clinic Level of Care Assessment Details Patient Name: Date of Service: Greg [name] [facility] [place] 12/26/2020 9:30 A M Medical Record Number: [id number] Patient Account Number: [id number] Date of Birth/Sex: Treating RN: August 15, 1950 (70 y.o. Greg [name] Primary Care Pearlean Sabina: PCP, NO Other Clinician: Referring Doreena Maulden: Treating Ashtynn Berke/Extender: [name] in Treatment: 11 Clinic Level of Care Assessment Items TOOL 4 Quantity Score []  - 0 Use when only an EandM is performed on FOLLOW-UP  visit ASSESSMENTS - Nursing Assessment / Reassessment X- 1 10 Reassessment of Co-morbidities (includes updates in patient status) X- 1 5 Reassessment of Adherence to Treatment Plan ASSESSMENTS - Wound and Skin A ssessment / Reassessment []  - 0 Simple Wound Assessment / Reassessment - one wound X- 3 5 Complex Wound Assessment / Reassessment - multiple wounds []  - 0 Dermatologic / Skin Assessment (not related to wound area) ASSESSMENTS - Focused Assessment []  - 0 Circumferential Edema Measurements - multi extremities []  - 0 Nutritional Assessment / Counseling / Intervention []  - 0 Lower Extremity Assessment (monofilament, tuning fork, pulses) []  - 0 Peripheral Arterial Disease Assessment (using hand held doppler) ASSESSMENTS - Ostomy and/or Continence Assessment and Care []  - 0 Incontinence Assessment and Management []  - 0 Ostomy Care Assessment and Management (repouching, etc.) PROCESS - Coordination of Care X - Simple Patient / Family Education for ongoing care 1 15 []  - 0 Complex (extensive) Patient / Family Education for ongoing care X- 1 10 Staff obtains Programmer, systems, Records, T Results / Process Orders est X- 1 10 Staff telephones HHA, Nursing Homes / Clarify orders / etc []  - 0 Routine Transfer to another Facility (non-emergent condition) []  - 0 Routine Hospital Admission (non-emergent condition) []  - 0 New Admissions / Biomedical engineer / Ordering NPWT Apligraf, etc. , []  - 0 Emergency Hospital Admission (emergent condition) X- 1 10 Simple Discharge Coordination []  - 0 Complex (extensive) Discharge Coordination PROCESS - Special Needs []  - 0 Pediatric / Minor Patient Management []  - 0 Isolation Patient Management []  - 0 Hearing / Language / Visual special needs []  - 0 Assessment of Community assistance (transportation, D/C planning, etc.) []  - 0 Additional assistance / Altered mentation []  - 0 Support Surface(s) Assessment (bed, cushion, seat,  etc.) INTERVENTIONS - Wound Cleansing / Measurement []  - 0 Simple Wound Cleansing - one wound X- 3 5 Complex Wound Cleansing - multiple wounds X- 1 5  Wound Imaging (photographs - any number of wounds) []  - 0 Wound Tracing (instead of photographs) []  - 0 Simple Wound Measurement - one wound X- 3 5 Complex Wound Measurement - multiple wounds INTERVENTIONS - Wound Dressings X - Small Wound Dressing one or multiple wounds 1 10 []  - 0 Medium Wound Dressing one or multiple wounds []  - 0 Large Wound Dressing one or multiple wounds X- 1 5 Application of Medications - topical []  - 0 Application of Medications - injection INTERVENTIONS - Miscellaneous []  - 0 External ear exam []  - 0 Specimen Collection (cultures, biopsies, blood, body fluids, etc.) []  - 0 Specimen(s) / Culture(s) sent or taken to Lab for analysis []  - 0 Patient Transfer (multiple staff / Civil Service fast streamer / Similar devices) []  - 0 Simple Staple / Suture removal (25 or less) []  - 0 Complex Staple / Suture removal (26 or more) []  - 0 Hypo / Hyperglycemic Management (close monitor of Blood Glucose) []  - 0 Ankle / Brachial Index (ABI) - do not check if billed separately X- 1 5 Vital Signs Has the patient been seen at the hospital within the last three years: Yes Total Score: 130 Level Of Care: New/Established - Level 4 Electronic Signature(s) Signed: 12/26/2020 5:46:53 PM By: Baruch Gouty RN, BSN Entered By: Baruch Gouty on 12/26/2020 10:04:45 -------------------------------------------------------------------------------- Encounter Discharge Information Details Patient Name: Date of Service: Greg [name] [place] 12/26/2020 9:30 A M Medical Record Number: [id number] Patient Account Number: [id number] Date of Birth/Sex: Treating RN: 03/11/1950 (70 y.o. Greg [name] Primary Care Deyra Perdomo: PCP, NO Other Clinician: Referring Berman Grainger: Treating Jerene Yeager/Extender: [name] in Treatment:  11 Encounter Discharge Information Items Discharge Condition: Stable Ambulatory Status: Cane Discharge Destination: Home Transportation: Private Auto Accompanied By: sister Schedule Follow-up Appointment: Yes Clinical Summary of Care: Patient Declined Electronic Signature(s) Signed: 12/26/2020 5:46:53 PM By: Baruch Gouty RN, BSN Entered By: Baruch Gouty on 12/26/2020 12:39:49 -------------------------------------------------------------------------------- Lower Extremity Assessment Details Patient Name: Date of Service: Greg [name] [place] 12/26/2020 9:30 A M Medical Record Number: [id number] Patient Account Number: [id number] Date of Birth/Sex: Treating RN: 05-15-1950 (70 y.o. Greg [name] Primary Care [place] Frieden: PCP, NO Other Clinician: Referring Madalaine Portier: Treating Cornelious Bartolucci/Extender: [name] in Treatment: 11 Electronic Signature(s) Signed: 12/26/2020 5:46:53 PM By: Baruch Gouty RN, BSN Entered By: Baruch Gouty on 12/26/2020 09:28:40 -------------------------------------------------------------------------------- Multi Wound Chart Details Patient Name: Date of Service: Greg [name] [place] 12/26/2020 9:30 A M Medical Record Number: [id number] Patient Account Number: [id number] Date of Birth/Sex: Treating RN: [birth year] (70 y.o. [name] Primary Care Shakema Surita: PCP, NO Other Clinician: Referring Arsh Feutz: Treating Jaxtyn Linville/Extender: [name] in Treatment: 11 Vital Signs Height(in): 68 Pulse(bpm): 86 Weight(lbs): 135 Blood Pressure(mmHg): 155/93 Body Mass Index(BMI): 21 Temperature(F): 97.5 Respiratory Rate(breaths/min): 18 Photos: Scrotum Posterior Scrotum Proximal, Anterior Scrotum Wound Location: Shear/Friction Gradually Appeared Gradually Appeared Wounding Event: Abrasion Pressure Ulcer Abrasion Primary Etiology: 06/15/2020 10/31/2020 12/26/2020 Date Acquired: 11 7 0 Weeks of Treatment: Open Open Open Wound  Status: Yes Yes Yes Clustered Wound: 1 4 2  Clustered Quantity: 0.6x1x0.1 0x0x0 0.5x1.2x0.1 Measurements L x W x D (cm) 0.471 0 0.471 A (cm) : rea 0.047 0 0.047 Volume (cm) : 94.60% 100.00% 0.00% % Reduction in A rea: 94.70% 100.00% 0.00% % Reduction in Volume: Full Thickness Without Exposed Category/Stage III Full Thickness Without Exposed Classification: Support Structures Support Structures Medium None Present Small Exudate Amount: Serosanguineous N/A Serosanguineous Exudate Type: red, brown N/A red, brown Exudate Color: Flat and  Intact N/A Flat and Intact Wound Margin: Large (67-100%) None Present (0%) Large (67-100%) Granulation Amount: Red N/A Red Granulation Quality: None Present (0%) None Present (0%) None Present (0%) Necrotic Amount: Fat Layer (Subcutaneous Tissue): Yes Fascia: No Fat Layer (Subcutaneous Tissue): Yes Exposed Structures: Fascia: No Fat Layer (Subcutaneous Tissue): No Fascia: No Tendon: No Tendon: No Tendon: No Muscle: No Muscle: No Muscle: No Joint: No Joint: No Joint: No Bone: No Bone: No Bone: No Small (1-33%) Large (67-100%) Medium (34-66%) Epithelialization: Treatment Notes Electronic Signature(s) Signed: 12/26/2020 10:19:08 AM By: Kalman Shan DO Signed: 12/26/2020 5:33:16 PM By: Deon Pilling RN, BSN Entered By: Kalman Shan on 12/26/2020 10:12:06 -------------------------------------------------------------------------------- Multi-Disciplinary Care Plan Details Patient Name: Date of Service: Greg [name] [place] 12/26/2020 9:30 A M Medical Record Number: [id number] Patient Account Number: [id number] Date of Birth/Sex: Treating RN: [birth year] (70 y.o. Greg [name] Primary Care Airam Heidecker: PCP, NO Other Clinician: Referring Boston Catarino: Treating Mccall Lomax/Extender: [name] in Treatment: 11 Multidisciplinary Care Plan reviewed with physician Active Inactive Wound/Skin Impairment Nursing  Diagnoses: Impaired tissue integrity Goals: Patient/caregiver will verbalize understanding of skin care regimen Date Initiated: 10/09/2020 Target Resolution Date: 01/05/2021 Goal Status: Active Ulcer/skin breakdown will have a volume reduction of 30% by week 4 Date Initiated: 10/09/2020 Date Inactivated: 11/07/2020 Target Resolution Date: 11/06/2020 Unmet Reason: pt refuses surgical Goal Status: Unmet reduction of hernia Ulcer/skin breakdown will have a volume reduction of 50% by week 8 Date Initiated: 11/07/2020 Date Inactivated: 12/05/2020 Target Resolution Date: 12/05/2020 Unmet Reason: wound measuring Goal Status: Unmet larger. Interventions: Assess patient/caregiver ability to obtain necessary supplies Assess patient/caregiver ability to perform ulcer/skin care regimen upon admission and as needed Assess ulceration(s) every visit Provide education on ulcer and skin care Treatment Activities: Topical wound management initiated : 10/09/2020 Notes: Electronic Signature(s) Signed: 12/26/2020 5:46:53 PM By: Baruch Gouty RN, BSN Entered By: Baruch Gouty on 12/26/2020 09:45:51 -------------------------------------------------------------------------------- Pain Assessment Details Patient Name: Date of Service: Greg [name] [place] 12/26/2020 9:30 A M Medical Record Number: [id number] Patient Account Number: [id number] Date of Birth/Sex: Treating RN: [birth year] (69 y.o. Greg [name] Primary Care Korbin Mapps: PCP, NO Other Clinician: Referring Nakiea Metzner: Treating Jamy Cleckler/Extender: [name] in Treatment: 11 Active Problems Location of Pain Severity and Description of Pain Patient Has Paino Yes Site Locations Pain Location: Pain Location: Generalized Pain With Dressing Change: No Duration of the Pain. Constant / Intermittento Constant Rate the pain. Current Pain Level: 2 Worst Pain Level: 9 Least Pain Level: 1 Character of Pain Describe the Pain:  Aching Pain Management and Medication Current Pain Management: Medication: Yes Is the Current Pain Management Adequate: Adequate How does your wound impact your activities of daily livingo Sleep: No Bathing: No Appetite: No Relationship With Others: No Bladder Continence: No Emotions: No Bowel Continence: No Work: No Toileting: No Drive: No Dressing: No Hobbies: No Electronic Signature(s) Signed: 12/26/2020 5:46:53 PM By: Baruch Gouty RN, BSN Entered By: Baruch Gouty on 12/26/2020 09:33:33 -------------------------------------------------------------------------------- Patient/Caregiver Education Details Patient Name: Date of Service: Greg [name] [place] 11/15/2022andnbsp9:30 A M Medical Record Number: [id number] Patient Account Number: [id number] Date of Birth/Gender: Treating RN: 1950-11-28 (70 y.o. Greg [name] Primary Care Physician: PCP, NO Other Clinician: Referring Physician: Treating Physician/Extender: [name] in Treatment: 11 Education Assessment Education Provided To: Patient Education Topics Provided Wound/Skin Impairment: Methods: Explain/Verbal Responses: Reinforcements needed, State content correctly Electronic Signature(s) Signed: 12/26/2020 5:46:53 PM By: Baruch Gouty RN, BSN Entered By: Baruch Gouty on 12/26/2020 09:46:22 --------------------------------------------------------------------------------  Wound Assessment Details Patient Name: Date of Service: Greg [name] [facility] [place] 12/26/2020 9:30 A M Medical Record Number: [id number] Patient Account Number: [id number] Date of Birth/Sex: Treating RN: 1950/07/12 (70 y.o. Greg [name] Primary Care Isra Lindy: PCP, NO Other Clinician: Referring Kelsay Haggard: Treating Lonnel Gjerde/Extender: [name] in Treatment: 11 Wound Status Wound Number: 1 Primary Etiology: Abrasion Wound Location: Scrotum Wound Status: Open Wounding Event: Shear/Friction Date Acquired:  06/15/2020 Weeks Of Treatment: 11 Clustered Wound: Yes Photos Wound Measurements Length: (cm) Width: (cm) Depth: (cm) Clustered Quantity: Area: (cm) Volume: (cm) 0.6 % Reduction in Area: 94.6% 1 % Reduction in Volume: 94.7% 0.1 Epithelialization: Small (1-33%) 1 Tunneling: No 0.471 Undermining: No 0.047 Wound Description Classification: Full Thickness Without Exposed Support Structures Wound Margin: Flat and Intact Exudate Amount: Medium Exudate Type: Serosanguineous Exudate Color: red, brown Foul Odor After Cleansing: No Slough/Fibrino No Wound Bed Granulation Amount: Large (67-100%) Exposed Structure Granulation Quality: Red Fascia Exposed: No Necrotic Amount: None Present (0%) Fat Layer (Subcutaneous Tissue) Exposed: Yes Tendon Exposed: No Muscle Exposed: No Joint Exposed: No Bone Exposed: No Treatment Notes Wound #1 (Scrotum) Cleanser Soap and Water Discharge Instruction: May shower and wash wound with dial antibacterial soap and water prior to dressing change. Peri-Wound Care Zinc Oxide Ointment 30g tube Discharge Instruction: Apply Zinc Oxide to periwound with each dressing change Topical Primary Dressing KerraCel Ag Gelling Fiber Dressing, 4x5 in (silver alginate) Discharge Instruction: Apply silver alginate to wound bed as instructed Secondary Dressing Woven Gauze Sponge, Non-Sterile 4x4 in Discharge Instruction: Apply over primary dressing as directed. ABD Pad, 8x10 Discharge Instruction: Apply over primary dressing as directed. Secured With [employer], 4.5x3.1 (in/yd) Discharge Instruction: Secure with Kerlix as directed. fish netting Discharge Instruction: to hold ABD pad or kerlix in place. Compression Wrap Compression Stockings Add-Ons Electronic Signature(s) Signed: 12/26/2020 5:46:53 PM By: Baruch Gouty RN, BSN Entered By: Baruch Gouty on 12/26/2020  09:43:14 -------------------------------------------------------------------------------- Wound Assessment Details Patient Name: Date of Service: Greg [name] [place] 12/26/2020 9:30 A M Medical Record Number: [id number] Patient Account Number: [id number] Date of Birth/Sex: Treating RN: December 31, 1950 (70 y.o. Greg [name] Primary Care Mace Weinberg: PCP, NO Other Clinician: Referring Jatoria Kneeland: Treating Tabor Bartram/Extender: [name] in Treatment: 11 Wound Status Wound Number: 2 Primary Etiology: Pressure Ulcer Wound Location: Posterior Scrotum Wound Status: Open Wounding Event: Gradually Appeared Date Acquired: 10/31/2020 Weeks Of Treatment: 7 Clustered Wound: Yes Photos Wound Measurements Length: (cm) Width: (cm) Depth: (cm) Clustered Quantity: Area: (cm) Volume: (cm) Wound Description Classification: Category/Stage III Exudate Amount: None Present Foul Odor After Cleansing: Slough/Fibrino 0 % Reduction in Area: 100% 0 % Reduction in Volume: 100% 0 Epithelialization: Large (67-100%) 4 Tunneling: No 0 Undermining: No 0 No No Wound Bed Granulation Amount: None Present (0%) Exposed Structure Necrotic Amount: None Present (0%) Fascia Exposed: No Fat Layer (Subcutaneous Tissue) Exposed: No Tendon Exposed: No Muscle Exposed: No Joint Exposed: No Bone Exposed: No Electronic Signature(s) Signed: 12/26/2020 5:46:53 PM By: Baruch Gouty RN, BSN Entered By: Baruch Gouty on 12/26/2020 09:43:52 -------------------------------------------------------------------------------- Wound Assessment Details Patient Name: Date of Service: Greg [name] [place] 12/26/2020 9:30 A M Medical Record Number: [id number] Patient Account Number: [id number] Date of Birth/Sex: Treating RN: 1950-12-19 (70 y.o. Greg [name] Primary Care Enoch Moffa: PCP, NO Other Clinician: Referring Avanna Sowder: Treating Ziaire Hagos/Extender: [name] in Treatment: 11 Wound  Status Wound Number: 3 Primary Etiology: Abrasion Wound Location: Proximal, Anterior Scrotum Wound Status: Open Wounding Event: Gradually Appeared Date Acquired: 12/26/2020 Weeks  Of Treatment: 0 Clustered Wound: Yes Photos Wound Measurements Length: (cm) 0.5 Width: (cm) 1.2 Depth: (cm) 0.1 Clustered Quantity: 2 Area: (cm) 0.471 Volume: (cm) 0.047 % Reduction in Area: 0% % Reduction in Volume: 0% Epithelialization: Medium (34-66%) Tunneling: No Undermining: No Wound Description Classification: Full Thickness Without Exposed Support Structures Wound Margin: Flat and Intact Exudate Amount: Small Exudate Type: Serosanguineous Exudate Color: red, brown Wound Bed Granulation Amount: Large (67-100%) Granulation Quality: Red Necrotic Amount: None Present (0%) Foul Odor After Cleansing: No Slough/Fibrino No Exposed Structure Fascia Exposed: No Fat Layer (Subcutaneous Tissue) Exposed: Yes Tendon Exposed: No Muscle Exposed: No Joint Exposed: No Bone Exposed: No Treatment Notes Wound #3 (Scrotum) Wound Laterality: Anterior, Proximal Cleanser Soap and Water Discharge Instruction: May shower and wash wound with dial antibacterial soap and water prior to dressing change. Peri-Wound Care Zinc Oxide Ointment 30g tube Discharge Instruction: Apply Zinc Oxide to periwound with each dressing change Topical Primary Dressing KerraCel Ag Gelling Fiber Dressing, 4x5 in (silver alginate) Discharge Instruction: Apply silver alginate to wound bed as instructed Secondary Dressing Woven Gauze Sponge, Non-Sterile 4x4 in Discharge Instruction: Apply over primary dressing as directed. ABD Pad, 8x10 Discharge Instruction: Apply over primary dressing as directed. Secured With [employer], 4.5x3.1 (in/yd) Discharge Instruction: Secure with Kerlix as directed. fish netting Discharge Instruction: to hold ABD pad or kerlix in place. Compression Wrap Compression  Stockings Add-Ons Electronic Signature(s) Signed: 12/26/2020 5:46:53 PM By: Baruch Gouty RN, BSN Entered By: Baruch Gouty on 12/26/2020 09:44:56 -------------------------------------------------------------------------------- Vitals Details Patient Name: Date of Service: Greg [name] [place] 12/26/2020 9:30 A M Medical Record Number: [id number] Patient Account Number: [id number] Date of Birth/Sex: Treating RN: 03-Jun-1950 (70 y.o. Greg [name] Primary Care Akeela Busk: PCP, NO Other Clinician: Referring Dayveon Halley: Treating Mairi Stagliano/Extender: [name] in Treatment: 11 Vital Signs Time Taken: 09:31 Temperature (F): 97.5 Height (in): 68 Pulse (bpm): 86 Source: Stated Respiratory Rate (breaths/min): 18 Weight (lbs): 135 Blood Pressure (mmHg): 155/93 Source: Stated Reference Range: 80 - 120 mg / dl Body Mass Index (BMI): 20.5 Electronic Signature(s) Signed: 12/26/2020 5:46:53 PM By: Baruch Gouty RN, BSN Entered By: Baruch Gouty on 12/26/2020 09:32:45
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
## 2021-01-09 ENCOUNTER — Encounter (HOSPITAL_BASED_OUTPATIENT_CLINIC_OR_DEPARTMENT_OTHER): Payer: Medicare Other | Admitting: Internal Medicine

## 2021-01-09 ENCOUNTER — Other Ambulatory Visit: Payer: Self-pay

## 2021-01-09 DIAGNOSIS — K4090 Unilateral inguinal hernia, without obstruction or gangrene, not specified as recurrent: Secondary | ICD-10-CM | POA: Diagnosis not present

## 2021-01-09 DIAGNOSIS — S3130XD Unspecified open wound of scrotum and testes, subsequent encounter: Secondary | ICD-10-CM | POA: Diagnosis not present

## 2021-01-09 NOTE — Progress Notes (Signed)
Greg [name], Greg [name] ([id number]) Visit Report for 01/09/2021 Arrival Information Details Patient Name: Date of Service: Greg [name] [facility] [place] 01/09/2021 9:45 A M Medical Record Number: [id number] Patient Account Number: [id number] Date of Birth/Sex: Treating RN: Feb 28, 1950 (70 y.o. Greg [name], [name] Primary Care Pearle Wandler: PCP, NO Other Clinician: Referring [name]: Treating [name]/Extender: [name] Visit Information History Since Last Visit Added or deleted any medications: No Patient Arrived: Cane Any new allergies or adverse reactions: No Arrival Time: 10:30 Had a fall or experienced change in No Accompanied By: self activities of daily living that may affect Transfer Assistance: None risk of falls: Patient Identification Verified: Yes Signs or symptoms of abuse/neglect since last visito No Secondary Verification Process Completed: Yes Hospitalized since last visit: No Patient Requires Transmission-Based Precautions: No Implantable device outside of the clinic excluding No Patient Has Alerts: Yes cellular tissue based products placed in the center since last visit: Has Dressing in Place as Prescribed: Yes Pain Present Now: No Electronic Signature(s) Signed: 01/09/2021 5:44:57 PM By: Deon Pilling RN, BSN Entered By: Deon Pilling on 01/09/2021 10:30:53 -------------------------------------------------------------------------------- Clinic Level of Care Assessment Details Patient Name: Date of Service: Greg [name] [facility] [place] 01/09/2021 9:45 A M Medical Record Number: [id number] Patient Account Number: [id number] Date of Birth/Sex: Treating RN: [birth year] (70 y.o. Greg [name] Primary Care [name]: PCP, NO Other Clinician: Referring [name]: Treating [name]/Extender: [name] Clinic Level of Care Assessment Items TOOL 4 Quantity Score X- 1 0 Use when only an EandM is performed on FOLLOW-UP visit ASSESSMENTS -  Nursing Assessment / Reassessment X- 1 10 Reassessment of Co-morbidities (includes updates in patient status) X- 1 5 Reassessment of Adherence to Treatment Plan ASSESSMENTS - Wound and Skin A ssessment / Reassessment []  - 0 Simple Wound Assessment / Reassessment - one wound X- 2 5 Complex Wound Assessment / Reassessment - multiple wounds X- 1 10 Dermatologic / Skin Assessment (not related to wound area) ASSESSMENTS - Focused Assessment []  - 0 Circumferential Edema Measurements - multi extremities X- 1 10 Nutritional Assessment / Counseling / Intervention []  - 0 Lower Extremity Assessment (monofilament, tuning fork, pulses) []  - 0 Peripheral Arterial Disease Assessment (using hand held doppler) ASSESSMENTS - Ostomy and/or Continence Assessment and Care []  - 0 Incontinence Assessment and Management []  - 0 Ostomy Care Assessment and Management (repouching, etc.) PROCESS - Coordination of Care []  - 0 Simple Patient / Family Education for ongoing care X- 1 20 Complex (extensive) Patient / Family Education for ongoing care X- 1 10 Staff obtains Programmer, systems, Records, T Results / Process Orders est X- 1 10 Staff telephones HHA, Nursing Homes / Clarify orders / etc []  - 0 Routine Transfer to another Facility (non-emergent condition) []  - 0 Routine Hospital Admission (non-emergent condition) []  - 0 New Admissions / Biomedical engineer / Ordering NPWT Apligraf, etc. , []  - 0 Emergency Hospital Admission (emergent condition) []  - 0 Simple Discharge Coordination X- 1 15 Complex (extensive) Discharge Coordination PROCESS - Special Needs []  - 0 Pediatric / Minor Patient Management []  - 0 Isolation Patient Management []  - 0 Hearing / Language / Visual special needs []  - 0 Assessment of Community assistance (transportation, D/C planning, etc.) []  - 0 Additional assistance / Altered mentation []  - 0 Support Surface(s) Assessment (bed, cushion, seat, etc.) INTERVENTIONS -  Wound Cleansing / Measurement []  - 0 Simple Wound Cleansing - one wound X- 2 5 Complex Wound Cleansing - multiple wounds X- 1 5 Wound  Imaging (photographs - any number of wounds) []  - 0 Wound Tracing (instead of photographs) []  - 0 Simple Wound Measurement - one wound X- 2 5 Complex Wound Measurement - multiple wounds INTERVENTIONS - Wound Dressings []  - 0 Small Wound Dressing one or multiple wounds X- 1 15 Medium Wound Dressing one or multiple wounds []  - 0 Large Wound Dressing one or multiple wounds []  - 0 Application of Medications - topical []  - 0 Application of Medications - injection INTERVENTIONS - Miscellaneous []  - 0 External ear exam []  - 0 Specimen Collection (cultures, biopsies, blood, body fluids, etc.) []  - 0 Specimen(s) / Culture(s) sent or taken to Lab for analysis []  - 0 Patient Transfer (multiple staff / Civil Service fast streamer / Similar devices) []  - 0 Simple Staple / Suture removal (25 or less) []  - 0 Complex Staple / Suture removal (26 or more) []  - 0 Hypo / Hyperglycemic Management (close monitor of Blood Glucose) []  - 0 Ankle / Brachial Index (ABI) - do not check if billed separately X- 1 5 Vital Signs Has the patient been seen at the hospital within the last three years: Yes Total Score: 145 Level Of Care: New/Established - Level 4 Electronic Signature(s) Signed: 01/09/2021 5:44:57 PM By: Deon Pilling RN, BSN Entered By: Deon Pilling on 01/09/2021 10:44:51 -------------------------------------------------------------------------------- Lower Extremity Assessment Details Patient Name: Date of Service: Greg [name] [place] 01/09/2021 9:45 A M Medical Record Number: [id number] Patient Account Number: [id number] Date of Birth/Sex: Treating RN: 1950/05/30 (70 y.o. Greg [name] Primary Care [name]: PCP, NO Other Clinician: Referring [name]: Treating [name]/Extender: [name] Electronic Signature(s) Signed:  01/09/2021 5:44:57 PM By: Deon Pilling RN, BSN Entered By: Deon Pilling on 01/09/2021 10:31:26 -------------------------------------------------------------------------------- Multi Wound Chart Details Patient Name: Date of Service: Greg [name] [place] 01/09/2021 9:45 A M Medical Record Number: [id number] Patient Account Number: [id number] Date of Birth/Sex: Treating RN: Jun 17, 1950 (70 y.o. M) Primary Care Bliss Tsang: PCP, NO Other Clinician: Referring Eugine Bubb: Treating Ladavion Savitz/Extender: [name] Vital Signs Height(in): 68 Pulse(bpm): 91 Weight(lbs): 135 Blood Pressure(mmHg): 112/79 Body Mass Index(BMI): 21 Temperature(F): 98.3 Respiratory Rate(breaths/min): 20 Photos: [N/A:N/A] Scrotum Proximal, Anterior Scrotum N/A Wound Location: Shear/Friction Gradually Appeared N/A Wounding Event: Abrasion Abrasion N/A Primary Etiology: 06/15/2020 12/26/2020 N/A Date Acquired: 13 2 N/A Weeks of Treatment: Open Open N/A Wound Status: Yes Yes N/A Clustered Wound: 2 1 N/A Clustered Quantity: 0.5x1.5x0.1 0.5x0.7x0.1 N/A Measurements L x W x D (cm) 0.589 0.275 N/A A (cm) : rea 0.059 0.027 N/A Volume (cm) : 93.30% 41.60% N/A % Reduction in Area: 93.30% 42.60% N/A % Reduction in Volume: Full Thickness Without Exposed Full Thickness Without Exposed N/A Classification: Support Structures Support Structures Medium Small N/A Exudate Amount: Serosanguineous Serosanguineous N/A Exudate Type: red, brown red, brown N/A Exudate Color: Flat and Intact Flat and Intact N/A Wound Margin: Large (67-100%) Large (67-100%) N/A Granulation Amount: Red Red N/A Granulation Quality: None Present (0%) None Present (0%) N/A Necrotic Amount: Fat Layer (Subcutaneous Tissue): Yes Fat Layer (Subcutaneous Tissue): Yes N/A Exposed Structures: Fascia: No Fascia: No Tendon: No Tendon: No Muscle: No Muscle: No Joint: No Joint: No Bone: No Bone: No Large  (67-100%) Large (67-100%) N/A Epithelialization: Treatment Notes Electronic Signature(s) Signed: 01/09/2021 10:50:45 AM By: Kalman Shan DO Entered By: Kalman Shan on 01/09/2021 10:47:27 -------------------------------------------------------------------------------- Multi-Disciplinary Care Plan Details Patient Name: Date of Service: Greg [name] [place] 01/09/2021 9:45 A M Medical Record Number: [id number] Patient Account  Number: [id number] Date of Birth/Sex: Treating RN: 26-Jul-1950 (70 y.o. Greg [name] Primary Care Terre Hanneman: PCP, NO Other Clinician: Referring Jenalyn Girdner: Treating Jaleel Allen/Extender: [name] Multidisciplinary Care Plan reviewed with physician Active Inactive Wound/Skin Impairment Nursing Diagnoses: Impaired tissue integrity Goals: Patient/caregiver will verbalize understanding of skin care regimen Date Initiated: 10/09/2020 Target Resolution Date: 01/25/2021 Goal Status: Active Ulcer/skin breakdown will have a volume reduction of 30% by week 4 Date Initiated: 10/09/2020 Date Inactivated: 11/07/2020 Target Resolution Date: 11/06/2020 Unmet Reason: pt refuses surgical Goal Status: Unmet reduction of hernia Ulcer/skin breakdown will have a volume reduction of 50% by week 8 Date Initiated: 11/07/2020 Date Inactivated: 12/05/2020 Target Resolution Date: 12/05/2020 Unmet Reason: wound measuring Goal Status: Unmet larger. Interventions: Assess patient/caregiver ability to obtain necessary supplies Assess patient/caregiver ability to perform ulcer/skin care regimen upon admission and as needed Assess ulceration(s) every visit Provide education on ulcer and skin care Treatment Activities: Topical wound management initiated : 10/09/2020 Notes: Electronic Signature(s) Signed: 01/09/2021 5:44:57 PM By: Deon Pilling RN, BSN Entered By: Deon Pilling on 01/09/2021  10:39:43 -------------------------------------------------------------------------------- Pain Assessment Details Patient Name: Date of Service: Greg [name] [place] 01/09/2021 9:45 A M Medical Record Number: [id number] Patient Account Number: [id number] Date of Birth/Sex: Treating RN: [birth year] (70 y.o. Greg [name] Primary Care Marley Pakula: PCP, NO Other Clinician: Referring Lonza Shimabukuro: Treating Milani Lowenstein/Extender: [name] Active Problems Location of Pain Severity and Description of Pain Patient Has Paino No Site Locations Rate the pain. Current Pain Level: 0 Pain Management and Medication Current Pain Management: Medication: No Cold Application: No Rest: No Massage: No Activity: No T.E.N.S.: No Heat Application: No Leg drop or elevation: No Is the Current Pain Management Adequate: Adequate How does your wound impact your activities of daily livingo Sleep: No Bathing: No Appetite: No Relationship With Others: No Bladder Continence: No Emotions: No Bowel Continence: No Work: No Toileting: No Drive: No Dressing: No Hobbies: No Engineer, maintenance) Signed: 01/09/2021 5:44:57 PM By: Deon Pilling RN, BSN Entered By: Deon Pilling on 01/09/2021 10:31:20 -------------------------------------------------------------------------------- Patient/Caregiver Education Details Patient Name: Date of Service: Greg [name] [place] 11/29/2022andnbsp9:45 A M Medical Record Number: [id number] Patient Account Number: [id number] Date of Birth/Gender: Treating RN: 20-Nov-1950 (70 y.o. Greg [name] Primary Care Physician: PCP, NO Other Clinician: Referring Physician: Treating Physician/Extender: [name] Education Assessment Education Provided To: Patient Education Topics Provided Wound/Skin Impairment: Handouts: Skin Care Do's and Dont's Methods: Explain/Verbal Responses: Reinforcements needed Electronic  Signature(s) Signed: 01/09/2021 5:44:57 PM By: Deon Pilling RN, BSN Entered By: Deon Pilling on 01/09/2021 10:39:56 -------------------------------------------------------------------------------- Wound Assessment Details Patient Name: Date of Service: Greg [name] [place] 01/09/2021 9:45 A M Medical Record Number: [id number] Patient Account Number: [id number] Date of Birth/Sex: Treating RN: February 20, 1950 (70 y.o. Greg [name] Primary Care Schylar Wuebker: PCP, NO Other Clinician: Referring Olney Monier: Treating Horatio Bertz/Extender: [name] Wound Status Wound Number: 1 Primary Etiology: Abrasion Wound Location: Scrotum Wound Status: Open Wounding Event: Shear/Friction Date Acquired: 06/15/2020 Weeks Of Treatment: 13 Clustered Wound: Yes Photos Wound Measurements Length: (cm) 0.5 Width: (cm) 1. Depth: (cm) 0. Clustered Quantity: 2 Area: (cm) 0 Volume: (cm) 0 % Reduction in Area: 93.3% 5 % Reduction in Volume: 93.3% 1 Epithelialization: Large (67-100%) Tunneling: No .589 Undermining: No .059 Wound Description Classification: Full Thickness Without Exposed Support Stru Wound Margin: Flat and Intact Exudate Amount: Medium Exudate Type: Serosanguineous Exudate Color: red, brown ctures Foul Odor After  Cleansing: No Slough/Fibrino No Wound Bed Granulation Amount: Large (67-100%) Exposed Structure Granulation Quality: Red Fascia Exposed: No Necrotic Amount: None Present (0%) Fat Layer (Subcutaneous Tissue) Exposed: Yes Tendon Exposed: No Muscle Exposed: No Joint Exposed: No Bone Exposed: No Electronic Signature(s) Signed: 01/09/2021 5:44:57 PM By: Deon Pilling RN, BSN Entered By: Deon Pilling on 01/09/2021 10:41:24 -------------------------------------------------------------------------------- Wound Assessment Details Patient Name: Date of Service: Greg [name] [place] 01/09/2021 9:45 A M Medical Record Number: [id number] Patient Account  Number: [id number] Date of Birth/Sex: Treating RN: 09/16/1950 (70 y.o. Greg [name] Primary Care Kennady Zimmerle: PCP, NO Other Clinician: Referring Azul Coffie: Treating Tracer Gutridge/Extender: [name] Wound Status Wound Number: 3 Primary Etiology: Abrasion Wound Location: Proximal, Anterior Scrotum Wound Status: Open Wounding Event: Gradually Appeared Date Acquired: 12/26/2020 Weeks Of Treatment: 2 Clustered Wound: Yes Photos Wound Measurements Length: (cm) 0. Width: (cm) 0. Depth: (cm) 0. Clustered Quantity: 1 Area: (cm) 0 Volume: (cm) 0 Wound Description Classification: Full Thickness Without Exposed Support Structu Wound Margin: Flat and Intact Exudate Amount: Small Exudate Type: Serosanguineous Exudate Color: red, brown Foul Odor After Cleansing: Slough/Fibrino 5 % Reduction in Area: 41.6% 7 % Reduction in Volume: 42.6% 1 Epithelialization: Large (67-100%) Tunneling: No .275 Undermining: No .027 res No No Wound Bed Granulation Amount: Large (67-100%) Exposed Structure Granulation Quality: Red Fascia Exposed: No Necrotic Amount: None Present (0%) Fat Layer (Subcutaneous Tissue) Exposed: Yes Tendon Exposed: No Muscle Exposed: No Joint Exposed: No Bone Exposed: No Electronic Signature(s) Signed: 01/09/2021 5:44:57 PM By: Deon Pilling RN, BSN Entered By: Deon Pilling on 01/09/2021 10:42:12 -------------------------------------------------------------------------------- Vitals Details Patient Name: Date of Service: Greg [name] [place] 01/09/2021 9:45 A M Medical Record Number: [id number] Patient Account Number: [id number] Date of Birth/Sex: Treating RN: 04-30-1950 (70 y.o. Greg [name] Primary Care Kjersten Ormiston: PCP, NO Other Clinician: Referring Clemence Stillings: Treating Najee Manninen/Extender: [name] Vital Signs Time Taken: 10:30 Temperature (F): 98.3 Height (in): 68 Pulse (bpm): 91 Weight (lbs):  135 Respiratory Rate (breaths/min): 20 Body Mass Index (BMI): 20.5 Blood Pressure (mmHg): 112/79 Reference Range: 80 - 120 mg / dl Electronic Signature(s) Signed: 01/09/2021 5:44:57 PM By: Deon Pilling RN, BSN Entered By: Deon Pilling on 01/09/2021 10:31:09
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
## 2021-01-23 ENCOUNTER — Other Ambulatory Visit: Payer: Self-pay

## 2021-01-23 ENCOUNTER — Encounter (HOSPITAL_BASED_OUTPATIENT_CLINIC_OR_DEPARTMENT_OTHER): Payer: Medicare Other | Attending: Internal Medicine | Admitting: Internal Medicine

## 2021-01-23 DIAGNOSIS — K4090 Unilateral inguinal hernia, without obstruction or gangrene, not specified as recurrent: Secondary | ICD-10-CM | POA: Diagnosis not present

## 2021-01-23 DIAGNOSIS — S3130XD Unspecified open wound of scrotum and testes, subsequent encounter: Secondary | ICD-10-CM | POA: Diagnosis present

## 2021-01-23 DIAGNOSIS — X58XXXD Exposure to other specified factors, subsequent encounter: Secondary | ICD-10-CM | POA: Diagnosis not present

## 2021-01-23 NOTE — Progress Notes (Signed)
Greg [name] ([id number]) Visit Report for 01/23/2021 Arrival Information Details Patient Name: Date of Service: Greg [name] [facility] [place] 01/23/2021 9:30 A M Medical Record Number: [id number] Patient Account Number: [id number] Date of Birth/Sex: Treating RN: 07-Aug-1950 (70 y.o. Greg [name], [name] Primary Care Quita Mcgrory: PCP, NO Other Clinician: Referring Alyssa Rotondo: Treating Keontre Defino/Extender: [name] in Treatment: 15 Visit Information History Since Last Visit Added or deleted any medications: No Patient Arrived: Cane Any new allergies or adverse reactions: No Arrival Time: 09:45 Had a fall or experienced change in No Accompanied By: self activities of daily living that may affect Transfer Assistance: None risk of falls: Patient Identification Verified: Yes Signs or symptoms of abuse/neglect since last visito No Secondary Verification Process Completed: Yes Hospitalized since last visit: No Patient Requires Transmission-Based Precautions: No Implantable device outside of the clinic excluding No Patient Has Alerts: Yes cellular tissue based products placed in the center since last visit: Has Dressing in Place as Prescribed: Yes Pain Present Now: No Electronic Signature(s) Signed: 01/23/2021 5:33:19 PM By: Deon Pilling RN, BSN Entered By: Deon Pilling on 01/23/2021 09:48:53 -------------------------------------------------------------------------------- Clinic Level of Care Assessment Details Patient Name: Date of Service: Greg [name] [facility] [place] 01/23/2021 9:30 A M Medical Record Number: [id number] Patient Account Number: [id number] Date of Birth/Sex: Treating RN: October 31, 1950 (70 y.o. Greg [name] Primary Care Tiwanna Tuch: PCP, NO Other Clinician: Referring Santi Troung: Treating Kimila Papaleo/Extender: [name] in Treatment: 15 Clinic Level of Care Assessment Items TOOL 4 Quantity Score X- 1 0 Use when only an EandM is performed on FOLLOW-UP visit ASSESSMENTS -  Nursing Assessment / Reassessment X- 1 10 Reassessment of Co-morbidities (includes updates in patient status) X- 1 5 Reassessment of Adherence to Treatment Plan ASSESSMENTS - Wound and Skin A ssessment / Reassessment X - Simple Wound Assessment / Reassessment - one wound 1 5 []  - 0 Complex Wound Assessment / Reassessment - multiple wounds X- 1 10 Dermatologic / Skin Assessment (not related to wound area) ASSESSMENTS - Focused Assessment []  - 0 Circumferential Edema Measurements - multi extremities X- 1 10 Nutritional Assessment / Counseling / Intervention []  - 0 Lower Extremity Assessment (monofilament, tuning fork, pulses) []  - 0 Peripheral Arterial Disease Assessment (using hand held doppler) ASSESSMENTS - Ostomy and/or Continence Assessment and Care []  - 0 Incontinence Assessment and Management []  - 0 Ostomy Care Assessment and Management (repouching, etc.) PROCESS - Coordination of Care X - Simple Patient / Family Education for ongoing care 1 15 []  - 0 Complex (extensive) Patient / Family Education for ongoing care X- 1 10 Staff obtains Programmer, systems, Records, T Results / Process Orders est []  - 0 Staff telephones HHA, Nursing Homes / Clarify orders / etc []  - 0 Routine Transfer to another Facility (non-emergent condition) []  - 0 Routine Hospital Admission (non-emergent condition) []  - 0 New Admissions / Biomedical engineer / Ordering NPWT Apligraf, etc. , []  - 0 Emergency Hospital Admission (emergent condition) X- 1 10 Simple Discharge Coordination []  - 0 Complex (extensive) Discharge Coordination PROCESS - Special Needs []  - 0 Pediatric / Minor Patient Management []  - 0 Isolation Patient Management []  - 0 Hearing / Language / Visual special needs []  - 0 Assessment of Community assistance (transportation, D/C planning, etc.) []  - 0 Additional assistance / Altered mentation []  - 0 Support Surface(s) Assessment (bed, cushion, seat, etc.) INTERVENTIONS -  Wound Cleansing / Measurement X - Simple Wound Cleansing - one wound 1 5 []  - 0 Complex Wound Cleansing - multiple wounds X-  1 5 Wound Imaging (photographs - any number of wounds) []  - 0 Wound Tracing (instead of photographs) X- 1 5 Simple Wound Measurement - one wound []  - 0 Complex Wound Measurement - multiple wounds INTERVENTIONS - Wound Dressings X - Small Wound Dressing one or multiple wounds 1 10 []  - 0 Medium Wound Dressing one or multiple wounds []  - 0 Large Wound Dressing one or multiple wounds []  - 0 Application of Medications - topical []  - 0 Application of Medications - injection INTERVENTIONS - Miscellaneous []  - 0 External ear exam []  - 0 Specimen Collection (cultures, biopsies, blood, body fluids, etc.) []  - 0 Specimen(s) / Culture(s) sent or taken to Lab for analysis []  - 0 Patient Transfer (multiple staff / Civil Service fast streamer / Similar devices) []  - 0 Simple Staple / Suture removal (25 or less) []  - 0 Complex Staple / Suture removal (26 or more) []  - 0 Hypo / Hyperglycemic Management (close monitor of Blood Glucose) []  - 0 Ankle / Brachial Index (ABI) - do not check if billed separately X- 1 5 Vital Signs Has the patient been seen at the hospital within the last three years: Yes Total Score: 105 Level Of Care: New/Established - Level 3 Electronic Signature(s) Signed: 01/23/2021 5:33:19 PM By: Deon Pilling RN, BSN Entered By: Deon Pilling on 01/23/2021 10:06:27 -------------------------------------------------------------------------------- Lower Extremity Assessment Details Patient Name: Date of Service: Greg [name] [place] 01/23/2021 9:30 A M Medical Record Number: [id number] Patient Account Number: [id number] Date of Birth/Sex: Treating RN: 07/21/1950 (70 y.o. Greg [name] Primary Care Lesleyanne Politte: PCP, NO Other Clinician: Referring Math Brazie: Treating Bricen Victory/Extender: [name] in Treatment: 15 Electronic Signature(s) Signed:  01/23/2021 5:33:19 PM By: Deon Pilling RN, BSN Entered By: Deon Pilling on 01/23/2021 09:49:19 -------------------------------------------------------------------------------- Multi Wound Chart Details Patient Name: Date of Service: Greg [name] [place] 01/23/2021 9:30 A M Medical Record Number: [id number] Patient Account Number: [id number] Date of Birth/Sex: Treating RN: 1950/07/28 (71 y.o. Greg [name] Primary Care Mry Lamia: PCP, NO Other Clinician: Referring Modene Andy: Treating Atanacio Melnyk/Extender: [name] in Treatment: 15 Vital Signs Height(in): 68 Pulse(bpm): 93 Weight(lbs): 135 Blood Pressure(mmHg): 130/79 Body Mass Index(BMI): 21 Temperature(F): 98.8 Respiratory Rate(breaths/min): 20 Photos: [1:No Photos Scrotum] [3:No Photos Proximal, Anterior Scrotum] [N/A:N/A N/A] Wound Location: [1:Shear/Friction] [3:Gradually Appeared] [N/A:N/A] Wounding Event: [1:Abrasion] [3:Abrasion] [N/A:N/A] Primary Etiology: [1:06/15/2020] [3:12/26/2020] [N/A:N/A] Date Acquired: [1:15] [3:4] [N/A:N/A] Weeks of Treatment: [1:Open] [3:Healed - Epithelialized] [N/A:N/A] Wound Status: [1:Yes] [3:Yes] [N/A:N/A] Clustered Wound: [1:1] [3:N/A] [N/A:N/A] Clustered Quantity: [1:1x1.3x0.1] [3:0x0x0] [N/A:N/A] Measurements L x W x D (cm) [1:1.021] [3:0] [N/A:N/A] A (cm) : rea [1:0.102] [3:0] [N/A:N/A] Volume (cm) : [1:88.40%] [3:100.00%] [N/A:N/A] % Reduction in A rea: [1:88.40%] [3:100.00%] [N/A:N/A] % Reduction in Volume: [1:Full Thickness Without Exposed] [3:Full Thickness Without Exposed] [N/A:N/A] Classification: [1:Support Structures Medium] [3:Support Structures Small] [N/A:N/A] Exudate Amount: [1:Serosanguineous] [3:Serosanguineous] [N/A:N/A] Exudate Type: [1:red, brown] [3:red, brown] [N/A:N/A] Exudate Color: [1:Flat and Intact] [3:N/A] [N/A:N/A] Wound Margin: [1:Large (67-100%)] [3:N/A] [N/A:N/A] Granulation Amount: [1:Red] [3:N/A] [N/A:N/A] Granulation Quality: [1:None  Present (0%)] [3:N/A] [N/A:N/A] Necrotic Amount: [1:Fat Layer (Subcutaneous Tissue): Yes N/A] [N/A:N/A] Exposed Structures: [1:Fascia: No Tendon: No Muscle: No Joint: No Bone: No Large (67-100%)] [3:N/A] [N/A:N/A] Treatment Notes Electronic Signature(s) Signed: 01/23/2021 10:19:26 AM By: Kalman Shan DO Signed: 01/23/2021 5:33:19 PM By: Deon Pilling RN, BSN Entered By: Kalman Shan on 01/23/2021 10:16:48 -------------------------------------------------------------------------------- [place] Details Patient Name: Date of Service: Greg [name] [place] 01/23/2021 9:30 A M Medical Record Number: [id number] Patient  Account Number: [id number] Date of Birth/Sex: Treating RN: 1950/12/06 (71 y.o. Greg [name] Primary Care Boruch Manuele: PCP, NO Other Clinician: Referring Xiong Haidar: Treating Kessler Kopinski/Extender: [name] in Treatment: 15 Multidisciplinary Care Plan reviewed with physician Active Inactive Wound/Skin Impairment Nursing Diagnoses: Impaired tissue integrity Goals: Patient/caregiver will verbalize understanding of skin care regimen Date Initiated: 10/09/2020 Target Resolution Date: 02/09/2021 Goal Status: Active Ulcer/skin breakdown will have a volume reduction of 30% by week 4 Date Initiated: 10/09/2020 Date Inactivated: 11/07/2020 Target Resolution Date: 11/06/2020 Unmet Reason: pt refuses surgical Goal Status: Unmet reduction of hernia Ulcer/skin breakdown will have a volume reduction of 50% by week 8 Date Initiated: 11/07/2020 Date Inactivated: 12/05/2020 Target Resolution Date: 12/05/2020 Unmet Reason: wound measuring Goal Status: Unmet larger. Interventions: Assess patient/caregiver ability to obtain necessary supplies Assess patient/caregiver ability to perform ulcer/skin care regimen upon admission and as needed Assess ulceration(s) every visit Provide education on ulcer and skin care Treatment Activities: Topical wound  management initiated : 10/09/2020 Notes: Electronic Signature(s) Signed: 01/23/2021 5:33:19 PM By: Deon Pilling RN, BSN Entered By: Deon Pilling on 01/23/2021 09:57:51 -------------------------------------------------------------------------------- Pain Assessment Details Patient Name: Date of Service: Greg [name] [place] 01/23/2021 9:30 A M Medical Record Number: [id number] Patient Account Number: [id number] Date of Birth/Sex: Treating RN: 19-Apr-1950 (70 y.o. Greg [name] Primary Care Lynzi Meulemans: PCP, NO Other Clinician: Referring Elvina Bosch: Treating Jairen Goldfarb/Extender: [name] in Treatment: 15 Active Problems Location of Pain Severity and Description of Pain Patient Has Paino No Site Locations Rate the pain. Current Pain Level: 0 Pain Management and Medication Current Pain Management: Medication: No Cold Application: No Rest: No Massage: No Activity: No T.E.N.S.: No Heat Application: No Leg drop or elevation: No Is the Current Pain Management Adequate: Adequate How does your wound impact your activities of daily livingo Sleep: No Bathing: No Appetite: No Relationship With Others: No Bladder Continence: No Emotions: No Bowel Continence: No Work: No Toileting: No Drive: No Dressing: No Hobbies: No Engineer, maintenance) Signed: 01/23/2021 5:33:19 PM By: Deon Pilling RN, BSN Entered By: Deon Pilling on 01/23/2021 09:49:15 -------------------------------------------------------------------------------- Patient/Caregiver Education Details Patient Name: Date of Service: Greg [name] [place] 12/13/2022andnbsp9:30 A M Medical Record Number: [id number] Patient Account Number: [id number] Date of Birth/Gender: Treating RN: 05-27-1950 (70 y.o. Greg [name] Primary Care Physician: PCP, NO Other Clinician: Referring Physician: Treating Physician/Extender: [name] in Treatment: 15 Education Assessment Education Provided  To: Patient Education Topics Provided Wound/Skin Impairment: Handouts: Skin Care Do's and Dont's Methods: Explain/Verbal Responses: Reinforcements needed Electronic Signature(s) Signed: 01/23/2021 5:33:19 PM By: Deon Pilling RN, BSN Entered By: Deon Pilling on 01/23/2021 09:58:01 -------------------------------------------------------------------------------- Wound Assessment Details Patient Name: Date of Service: Greg [name] [place] 01/23/2021 9:30 A M Medical Record Number: [id number] Patient Account Number: [id number] Date of Birth/Sex: Treating RN: October 19, 1950 (70 y.o. Greg [name] Primary Care Sanav Remer: PCP, NO Other Clinician: Referring Maryelizabeth Eberle: Treating Torin Whisner/Extender: [name] in Treatment: 15 Wound Status Wound Number: 1 Primary Etiology: Abrasion Wound Location: Scrotum Wound Status: Open Wounding Event: Shear/Friction Date Acquired: 06/15/2020 Weeks Of Treatment: 15 Clustered Wound: Yes Wound Measurements Length: (cm) Width: (cm) Depth: (cm) Clustered Quantity: Area: (cm) Volume: (cm) 1 % Reduction in Area: 88.4% 1.3 % Reduction in Volume: 88.4% 0.1 Epithelialization: Large (67-100%) 1 Tunneling: No 1.021 Undermining: No 0.102 Wound Description Classification: Full Thickness Without Exposed Support Stru Wound Margin: Flat and Intact Exudate Amount: Medium Exudate Type: Serosanguineous Exudate Color: red, brown ctures Foul Odor After Cleansing: No Slough/Fibrino No  Wound Bed Granulation Amount: Large (67-100%) Exposed Structure Granulation Quality: Red Fascia Exposed: No Necrotic Amount: None Present (0%) Fat Layer (Subcutaneous Tissue) Exposed: Yes Tendon Exposed: No Muscle Exposed: No Joint Exposed: No Bone Exposed: No Electronic Signature(s) Signed: 01/23/2021 5:33:19 PM By: Deon Pilling RN, BSN Entered By: Deon Pilling on 01/23/2021  09:57:34 -------------------------------------------------------------------------------- Wound Assessment Details Patient Name: Date of Service: Greg [name] [place] 01/23/2021 9:30 A M Medical Record Number: [id number] Patient Account Number: [id number] Date of Birth/Sex: Treating RN: March 24, 1950 (69 y.o. Greg [name] Primary Care Tamecca Artiga: PCP, NO Other Clinician: Referring Kesley Mullens: Treating Joas Motton/Extender: [name] in Treatment: 15 Wound Status Wound Number: 3 Primary Etiology: Abrasion Wound Location: Proximal, Anterior Scrotum Wound Status: Healed - Epithelialized Wounding Event: Gradually Appeared Date Acquired: 12/26/2020 Weeks Of Treatment: 4 Clustered Wound: Yes Wound Measurements Length: (cm) Width: (cm) Depth: (cm) Area: (cm) Volume: (cm) 0 % Reduction in Area: 100% 0 % Reduction in Volume: 100% 0 0 0 Wound Description Classification: Full Thickness Without Exposed Support Structu Exudate Amount: Small Exudate Type: Serosanguineous Exudate Color: red, brown res Electronic Signature(s) Signed: 01/23/2021 5:33:19 PM By: Deon Pilling RN, BSN Entered By: Deon Pilling on 01/23/2021 09:57:17 -------------------------------------------------------------------------------- Vitals Details Patient Name: Date of Service: Greg [name] [place] 01/23/2021 9:30 A M Medical Record Number: [id number] Patient Account Number: [id number] Date of Birth/Sex: Treating RN: 08-15-1950 (70 y.o. Greg [name] Primary Care Jsaon Yoo: PCP, NO Other Clinician: Referring Brodee Mauritz: Treating Zeah Germano/Extender: [name] in Treatment: 15 Vital Signs Time Taken: 09:45 Temperature (F): 98.8 Height (in): 68 Pulse (bpm): 93 Weight (lbs): 135 Respiratory Rate (breaths/min): 20 Body Mass Index (BMI): 20.5 Blood Pressure (mmHg): 130/79 Reference Range: 80 - 120 mg / dl Electronic Signature(s) Signed: 01/23/2021 5:33:19 PM By: Deon Pilling RN,  BSN Entered By: Deon Pilling on 01/23/2021 09:49:07
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
## 2021-02-13 ENCOUNTER — Other Ambulatory Visit: Payer: Self-pay

## 2021-02-13 ENCOUNTER — Encounter (HOSPITAL_BASED_OUTPATIENT_CLINIC_OR_DEPARTMENT_OTHER): Payer: Medicare Other | Attending: Internal Medicine | Admitting: Internal Medicine

## 2021-02-13 DIAGNOSIS — S3130XA Unspecified open wound of scrotum and testes, initial encounter: Secondary | ICD-10-CM | POA: Insufficient documentation

## 2021-02-13 DIAGNOSIS — K4090 Unilateral inguinal hernia, without obstruction or gangrene, not specified as recurrent: Secondary | ICD-10-CM | POA: Diagnosis not present

## 2021-02-13 DIAGNOSIS — X58XXXA Exposure to other specified factors, initial encounter: Secondary | ICD-10-CM | POA: Diagnosis not present

## 2021-02-13 DIAGNOSIS — S3130XD Unspecified open wound of scrotum and testes, subsequent encounter: Secondary | ICD-10-CM | POA: Diagnosis not present
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
## 2021-02-14 NOTE — Progress Notes (Signed)
OSHA, ERRICO ([id number]) Visit Report for 02/13/2021 Arrival Information Details Patient Name: Date of Service: Greg [name] [facility] [place] 02/13/2021 9:45 A M Medical Record Number: [id number] Patient Account Number: [id number] Date of Birth/Sex: Treating RN: 16-Dec-1950 (71 y.o. Greg [name] Primary Care Greg [name]: PCP, NO Other Clinician: Referring Greg [name]: Treating Greg [name]/Extender: Greg [name] in Treatment: 18 Visit Information History Since Last Visit Added or deleted any medications: No Patient Arrived: Ambulatory Any new allergies or adverse reactions: No Arrival Time: 10:08 Had a fall or experienced change in No Accompanied By: self activities of daily living that may affect Transfer Assistance: None risk of falls: Patient Identification Verified: Yes Signs or symptoms of abuse/neglect since last visito No Patient Requires Transmission-Based Precautions: No Hospitalized since last visit: No Patient Has Alerts: Yes Implantable device outside of the clinic excluding No cellular tissue based products placed in the center since last visit: Has Dressing in Place as Prescribed: Yes Pain Present Now: No Electronic Signature(s) Signed: 02/13/2021 5:19:42 PM By: Greg Catholic RN Entered By: Greg [name] on 02/13/2021 10:08:45 -------------------------------------------------------------------------------- Clinic Level of Care Assessment Details Patient Name: Date of Service: Greg [name] [facility] [place] 02/13/2021 9:45 A M Medical Record Number: [id number] Patient Account Number: [id number] Date of Birth/Sex: Treating RN: Feb 18, 1950 (71 y.o. Greg [name] Primary Care Greg [name]: PCP, NO Other Clinician: Referring Greg [name]: Treating Greg [name]/Extender: Greg [name] in Treatment: 18 Clinic Level of Care Assessment Items TOOL 4 Quantity Score []  - 0 Use when only an EandM is performed on FOLLOW-UP visit ASSESSMENTS - Nursing Assessment / Reassessment X- 1  10 Reassessment of Co-morbidities (includes updates in patient status) X- 1 5 Reassessment of Adherence to Treatment Plan ASSESSMENTS - Wound and Skin A ssessment / Reassessment X - Simple Wound Assessment / Reassessment - one wound 1 5 []  - 0 Complex Wound Assessment / Reassessment - multiple wounds []  - 0 Dermatologic / Skin Assessment (not related to wound area) ASSESSMENTS - Focused Assessment []  - 0 Circumferential Edema Measurements - multi extremities []  - 0 Nutritional Assessment / Counseling / Intervention []  - 0 Lower Extremity Assessment (monofilament, tuning fork, pulses) []  - 0 Peripheral Arterial Disease Assessment (using hand held doppler) ASSESSMENTS - Ostomy and/or Continence Assessment and Care []  - 0 Incontinence Assessment and Management []  - 0 Ostomy Care Assessment and Management (repouching, etc.) PROCESS - Coordination of Care X - Simple Patient / Family Education for ongoing care 1 15 []  - 0 Complex (extensive) Patient / Family Education for ongoing care X- 1 10 Staff obtains Programmer, systems, Records, T Results / Process Orders est X- 1 10 Staff telephones HHA, Nursing Homes / Clarify orders / etc []  - 0 Routine Transfer to another Facility (non-emergent condition) []  - 0 Routine Hospital Admission (non-emergent condition) []  - 0 New Admissions / Biomedical engineer / Ordering NPWT Apligraf, etc. , []  - 0 Emergency Hospital Admission (emergent condition) X- 1 10 Simple Discharge Coordination []  - 0 Complex (extensive) Discharge Coordination PROCESS - Special Needs []  - 0 Pediatric / Minor Patient Management []  - 0 Isolation Patient Management []  - 0 Hearing / Language / Visual special needs []  - 0 Assessment of Community assistance (transportation, D/C planning, etc.) []  - 0 Additional assistance / Altered mentation []  - 0 Support Surface(s) Assessment (bed, cushion, seat, etc.) INTERVENTIONS - Wound Cleansing / Measurement X - Simple  Wound Cleansing - one wound 1 5 []  - 0 Complex Wound Cleansing - multiple wounds X- 1 5 Wound Imaging (photographs -  any number of wounds) []  - 0 Wound Tracing (instead of photographs) X- 1 5 Simple Wound Measurement - one wound []  - 0 Complex Wound Measurement - multiple wounds INTERVENTIONS - Wound Dressings X - Small Wound Dressing one or multiple wounds 1 10 []  - 0 Medium Wound Dressing one or multiple wounds []  - 0 Large Wound Dressing one or multiple wounds X- 1 5 Application of Medications - topical []  - 0 Application of Medications - injection INTERVENTIONS - Miscellaneous []  - 0 External ear exam []  - 0 Specimen Collection (cultures, biopsies, blood, body fluids, etc.) []  - 0 Specimen(s) / Culture(s) sent or taken to Lab for analysis []  - 0 Patient Transfer (multiple staff / Civil Service fast streamer / Similar devices) []  - 0 Simple Staple / Suture removal (25 or less) []  - 0 Complex Staple / Suture removal (26 or more) []  - 0 Hypo / Hyperglycemic Management (close monitor of Blood Glucose) []  - 0 Ankle / Brachial Index (ABI) - do not check if billed separately X- 1 5 Vital Signs Has the patient been seen at the hospital within the last three years: Yes Total Score: 100 Level Of Care: New/Established - Level 3 Electronic Signature(s) Signed: 02/13/2021 5:28:36 PM By: Greg Gouty RN, BSN Entered By: Greg [name] on 02/13/2021 10:32:19 -------------------------------------------------------------------------------- Encounter Discharge Information Details Patient Name: Date of Service: Greg [name] [place] 02/13/2021 9:45 A M Medical Record Number: [id number] Patient Account Number: [id number] Date of Birth/Sex: Treating RN: [birth year] (71 y.o. Greg [name] Primary Care Greg [name]: PCP, NO Other Clinician: Referring Greg [name]: Treating Greg [name]/Extender: Greg [name] in Treatment: 18 Encounter Discharge Information Items Discharge Condition:  Stable Ambulatory Status: Cane Discharge Destination: Home Transportation: Private Auto Accompanied By: sister Schedule Follow-up Appointment: Yes Clinical Summary of Care: Patient Declined Electronic Signature(s) Signed: 02/13/2021 5:28:36 PM By: Greg Gouty RN, BSN Entered By: Greg [name] on 02/13/2021 10:35:28 -------------------------------------------------------------------------------- Lower Extremity Assessment Details Patient Name: Date of Service: Greg [name] [place] 02/13/2021 9:45 A M Medical Record Number: [id number] Patient Account Number: [id number] Date of Birth/Sex: Treating RN: 1950-10-09 (71 y.o. Greg [name] Primary Care Nayelis Bonito: PCP, NO Other Clinician: Referring Daron Stutz: Treating Eliya Geiman/Extender: Greg [name] in Treatment: 18 Electronic Signature(s) Signed: 02/13/2021 5:19:42 PM By: Greg Catholic RN Entered By: Greg [name] on 02/13/2021 10:09:17 -------------------------------------------------------------------------------- Multi Wound Chart Details Patient Name: Date of Service: Greg [name] [place] 02/13/2021 9:45 A M Medical Record Number: [id number] Patient Account Number: [id number] Date of Birth/Sex: Treating RN: 1950-03-05 (71 y.o. [name] Primary Care Ronne Stefanski: PCP, NO Other Clinician: Referring Shyane Fossum: Treating Lelaina Oatis/Extender: Greg [name] in Treatment: 18 Vital Signs Height(in): 68 Pulse(bpm): 93 Weight(lbs): 135 Blood Pressure(mmHg): 125/82 Body Mass Index(BMI): 21 Temperature(F): 97.5 Respiratory Rate(breaths/min): 20 Photos: [N/A:N/A] Scrotum N/A N/A Wound Location: Shear/Friction N/A N/A Wounding Event: Abrasion N/A N/A Primary Etiology: 06/15/2020 N/A N/A Date Acquired: 73 N/A N/A Weeks of Treatment: Open N/A N/A Wound Status: Yes N/A N/A Clustered Wound: 1 N/A N/A Clustered Quantity: 0.1x0.1x0.1 N/A N/A Measurements L x W x D (cm) 0.008 N/A N/A A (cm) : rea 0.001 N/A  N/A Volume (cm) : 99.90% N/A N/A % Reduction in A rea: 99.90% N/A N/A % Reduction in Volume: Full Thickness Without Exposed N/A N/A Classification: Support Structures Medium N/A N/A Exudate Amount: Serous N/A N/A Exudate Type: amber N/A N/A Exudate Color: Flat and Intact N/A N/A Wound Margin: None Present (0%) N/A N/A Granulation Amount: None Present (0%) N/A N/A Necrotic Amount:  Fat Layer (Subcutaneous Tissue): Yes N/A N/A Exposed Structures: Fascia: No Tendon: No Muscle: No Joint: No Bone: No Large (67-100%) N/A N/A Epithelialization: Treatment Notes Wound #1 (Scrotum) Cleanser Soap and Water Discharge Instruction: May shower and wash wound with dial antibacterial soap and water prior to dressing change. Peri-Wound Care Zinc Oxide Ointment 30g tube Discharge Instruction: Apply Zinc Oxide as needed to periwound with each dressing change Topical Primary Dressing KerraCel Ag Gelling Fiber Dressing, 4x5 in (silver alginate) Discharge Instruction: Apply silver alginate to wound bed as instructed Secondary Dressing Woven Gauze Sponge, Non-Sterile 4x4 in Discharge Instruction: Apply over primary dressing as directed. ABD Pad, 8x10 Discharge Instruction: Apply over primary dressing as directed. Secured With [employer], 4.5x3.1 (in/yd) Discharge Instruction: Secure with Kerlix as directed. fish netting Discharge Instruction: to hold ABD pad or kerlix in place. Compression Wrap Compression Stockings Add-Ons Electronic Signature(s) Signed: 02/13/2021 11:09:27 AM By: Kalman Shan DO Signed: 02/13/2021 5:28:06 PM By: Deon Pilling RN, BSN Entered By: Kalman Shan on 02/13/2021 11:05:09 -------------------------------------------------------------------------------- Multi-Disciplinary Care Plan Details Patient Name: Date of Service: Greg [name] [place] 02/13/2021 9:45 A M Medical Record Number: [id number] Patient Account Number: [id number] Date of  Birth/Sex: Treating RN: Aug 21, 1950 (71 y.o. Greg [name] Primary Care Kacper Cartlidge: PCP, NO Other Clinician: Referring Jerret Mcbane: Treating Renella Steig/Extender: Greg [name] in Treatment: 18 Multidisciplinary Care Plan reviewed with physician Active Inactive Wound/Skin Impairment Nursing Diagnoses: Impaired tissue integrity Goals: Patient/caregiver will verbalize understanding of skin care regimen Date Initiated: 10/09/2020 Target Resolution Date: 03/09/2021 Goal Status: Active Ulcer/skin breakdown will have a volume reduction of 30% by week 4 Date Initiated: 10/09/2020 Date Inactivated: 11/07/2020 Target Resolution Date: 11/06/2020 Unmet Reason: pt refuses surgical Goal Status: Unmet reduction of hernia Ulcer/skin breakdown will have a volume reduction of 50% by week 8 Date Initiated: 11/07/2020 Date Inactivated: 12/05/2020 Target Resolution Date: 12/05/2020 Unmet Reason: wound measuring Goal Status: Unmet larger. Interventions: Assess patient/caregiver ability to obtain necessary supplies Assess patient/caregiver ability to perform ulcer/skin care regimen upon admission and as needed Assess ulceration(s) every visit Provide education on ulcer and skin care Treatment Activities: Topical wound management initiated : 10/09/2020 Notes: Electronic Signature(s) Signed: 02/13/2021 5:28:36 PM By: Greg Gouty RN, BSN Entered By: Greg [name] on 02/13/2021 10:31:22 -------------------------------------------------------------------------------- Pain Assessment Details Patient Name: Date of Service: Greg [name] [place] 02/13/2021 9:45 A M Medical Record Number: [id number] Patient Account Number: [id number] Date of Birth/Sex: Treating RN: [birth year] (71 y.o. Greg [name] Primary Care Odalys Win: PCP, NO Other Clinician: Referring Marciana Uplinger: Treating Bryley Kovacevic/Extender: Greg [name] in Treatment: 18 Active Problems Location of Pain Severity and Description of  Pain Patient Has Paino No Site Locations Pain Management and Medication Current Pain Management: Electronic Signature(s) Signed: 02/13/2021 5:19:42 PM By: Greg Catholic RN Entered By: Greg [name] on 02/13/2021 10:09:05 -------------------------------------------------------------------------------- Patient/Caregiver Education Details Patient Name: Date of Service: Greg [name] [place] 1/3/2023andnbsp9:45 A M Medical Record Number: [id number] Patient Account Number: [id number] Date of Birth/Gender: Treating RN: 1951-01-23 (70 y.o. Greg [name] Primary Care Physician: PCP, NO Other Clinician: Referring Physician: Treating Physician/Extender: Greg [name] in Treatment: 18 Education Assessment Education Provided To: Patient Education Topics Provided Wound/Skin Impairment: Methods: Explain/Verbal Responses: Reinforcements needed, State content correctly Electronic Signature(s) Signed: 02/13/2021 5:28:36 PM By: Greg Gouty RN, BSN Entered By: Greg [name] on 02/13/2021 10:31:45 -------------------------------------------------------------------------------- Wound Assessment Details Patient Name: Date of Service: Greg [name] [place] 02/13/2021 9:45 A M Medical Record Number: [id number] Patient Account Number: [id number] Date  of Birth/Sex: Treating RN: 1950-12-23 (71 y.o. Greg [name] Primary Care Madia Carvell: PCP, NO Other Clinician: Referring Jody Aguinaga: Treating Yeraldin Litzenberger/Extender: Greg [name] in Treatment: 18 Wound Status Wound Number: 1 Primary Etiology: Abrasion Wound Location: Scrotum Wound Status: Open Wounding Event: Shear/Friction Date Acquired: 06/15/2020 Weeks Of Treatment: 18 Clustered Wound: Yes Photos Wound Measurements Length: (cm) Width: (cm) Depth: (cm) Clustered Quantity: Area: (cm) Volume: (cm) 0.1 % Reduction in Area: 99.9% 0.1 % Reduction in Volume: 99.9% 0.1 Epithelialization: Large (67-100%) 1 Tunneling:  No 0.008 Undermining: No 0.001 Wound Description Classification: Full Thickness Without Exposed Support Stru Wound Margin: Flat and Intact Exudate Amount: Medium Exudate Type: Serous Exudate Color: amber ctures Foul Odor After Cleansing: No Slough/Fibrino No Wound Bed Granulation Amount: None Present (0%) Exposed Structure Necrotic Amount: None Present (0%) Fascia Exposed: No Fat Layer (Subcutaneous Tissue) Exposed: Yes Tendon Exposed: No Muscle Exposed: No Joint Exposed: No Bone Exposed: No Treatment Notes Wound #1 (Scrotum) Cleanser Soap and Water Discharge Instruction: May shower and wash wound with dial antibacterial soap and water prior to dressing change. Peri-Wound Care Zinc Oxide Ointment 30g tube Discharge Instruction: Apply Zinc Oxide as needed to periwound with each dressing change Topical Primary Dressing KerraCel Ag Gelling Fiber Dressing, 4x5 in (silver alginate) Discharge Instruction: Apply silver alginate to wound bed as instructed Secondary Dressing Woven Gauze Sponge, Non-Sterile 4x4 in Discharge Instruction: Apply over primary dressing as directed. ABD Pad, 8x10 Discharge Instruction: Apply over primary dressing as directed. Secured With [employer], 4.5x3.1 (in/yd) Discharge Instruction: Secure with Kerlix as directed. fish netting Discharge Instruction: to hold ABD pad or kerlix in place. Compression Wrap Compression Stockings Add-Ons Electronic Signature(s) Signed: 02/13/2021 5:19:42 PM By: Greg Catholic RN Signed: 02/14/2021 10:36:58 AM By: Sandre Kitty Entered By: Sandre Kitty on 02/13/2021 10:13:07 -------------------------------------------------------------------------------- Vitals Details Patient Name: Date of Service: Greg [name] [place] 02/13/2021 9:45 A M Medical Record Number: [id number] Patient Account Number: [id number] Date of Birth/Sex: Treating RN: 1950/12/03 (71 y.o. Greg [name] Primary Care Yatziry Deakins:  PCP, NO Other Clinician: Referring Havoc Sanluis: Treating Nailani Full/Extender: Greg [name] in Treatment: 18 Vital Signs Time Taken: 10:07 Temperature (F): 97.5 Height (in): 68 Pulse (bpm): 93 Weight (lbs): 135 Respiratory Rate (breaths/min): 20 Body Mass Index (BMI): 20.5 Blood Pressure (mmHg): 125/82 Reference Range: 80 - 120 mg / dl Electronic Signature(s) Signed: 02/13/2021 5:19:42 PM By: Greg Catholic RN Entered By: Greg [name] on 02/13/2021 10:07:56

## 2021-02-27 ENCOUNTER — Other Ambulatory Visit: Payer: Self-pay

## 2021-02-27 ENCOUNTER — Encounter (HOSPITAL_BASED_OUTPATIENT_CLINIC_OR_DEPARTMENT_OTHER): Payer: Medicare Other | Admitting: Internal Medicine

## 2021-02-27 DIAGNOSIS — S3130XA Unspecified open wound of scrotum and testes, initial encounter: Secondary | ICD-10-CM | POA: Diagnosis not present

## 2021-02-27 DIAGNOSIS — S3130XD Unspecified open wound of scrotum and testes, subsequent encounter: Secondary | ICD-10-CM

## 2021-02-27 DIAGNOSIS — K4090 Unilateral inguinal hernia, without obstruction or gangrene, not specified as recurrent: Secondary | ICD-10-CM

## 2021-02-27 NOTE — Progress Notes (Signed)
Greg [name], Greg [name] ([id number]) Visit Report for 02/27/2021 Arrival Information Details Patient Name: Date of Service: Greg [name] [facility] [place] 02/27/2021 10:00 A M Medical Record Number: [id number] Patient Account Number: [id number] Date of Birth/Sex: Treating RN: 1950/06/29 (71 y.o. Mare Ferrari Primary Care Hermelinda Diegel: PCP, NO Other Clinician: Referring Evoleht Hovatter: Treating Skylynn Burkley/Extender: [name] in Treatment: 20 Visit Information History Since Last Visit Added or deleted any medications: No Patient Arrived: Cane Any new allergies or adverse reactions: No Arrival Time: 10:09 Had a fall or experienced change in No Accompanied By: sister activities of daily living that may affect Transfer Assistance: None risk of falls: Patient Identification Verified: Yes Signs or symptoms of abuse/neglect since last visito No Secondary Verification Process Completed: Yes Hospitalized since last visit: No Patient Requires Transmission-Based Precautions: No Implantable device outside of the clinic excluding No Patient Has Alerts: Yes cellular tissue based products placed in the center since last visit: Has Dressing in Place as Prescribed: Yes Pain Present Now: Yes Electronic Signature(s) Signed: 02/27/2021 11:38:13 AM By: Sharyn Creamer RN, BSN Entered By: Sharyn Creamer on 02/27/2021 10:12:47 -------------------------------------------------------------------------------- Clinic Level of Care Assessment Details Patient Name: Date of Service: Greg [name] [facility] [place] 02/27/2021 10:00 A M Medical Record Number: [id number] Patient Account Number: [id number] Date of Birth/Sex: Treating RN: July 24, 1950 (71 y.o. [name] Primary Care Arsh Feutz: PCP, NO Other Clinician: Referring Suzetta Timko: Treating Nathaneil Feagans/Extender: [name] in Treatment: 20 Clinic Level of Care Assessment Items TOOL 4 Quantity Score []  - 0 Use when only an EandM is performed on FOLLOW-UP  visit ASSESSMENTS - Nursing Assessment / Reassessment X- 1 10 Reassessment of Co-morbidities (includes updates in patient status) X- 1 5 Reassessment of Adherence to Treatment Plan ASSESSMENTS - Wound and Skin A ssessment / Reassessment X - Simple Wound Assessment / Reassessment - one wound 1 5 []  - 0 Complex Wound Assessment / Reassessment - multiple wounds []  - 0 Dermatologic / Skin Assessment (not related to wound area) ASSESSMENTS - Focused Assessment []  - 0 Circumferential Edema Measurements - multi extremities []  - 0 Nutritional Assessment / Counseling / Intervention []  - 0 Lower Extremity Assessment (monofilament, tuning fork, pulses) []  - 0 Peripheral Arterial Disease Assessment (using hand held doppler) ASSESSMENTS - Ostomy and/or Continence Assessment and Care []  - 0 Incontinence Assessment and Management []  - 0 Ostomy Care Assessment and Management (repouching, etc.) PROCESS - Coordination of Care X - Simple Patient / Family Education for ongoing care 1 15 []  - 0 Complex (extensive) Patient / Family Education for ongoing care X- 1 10 Staff obtains Programmer, systems, Records, T Results / Process Orders est X- 1 10 Staff telephones HHA, Nursing Homes / Clarify orders / etc []  - 0 Routine Transfer to another Facility (non-emergent condition) []  - 0 Routine Hospital Admission (non-emergent condition) []  - 0 New Admissions / Biomedical engineer / Ordering NPWT Apligraf, etc. , []  - 0 Emergency Hospital Admission (emergent condition) X- 1 10 Simple Discharge Coordination []  - 0 Complex (extensive) Discharge Coordination PROCESS - Special Needs []  - 0 Pediatric / Minor Patient Management []  - 0 Isolation Patient Management []  - 0 Hearing / Language / Visual special needs []  - 0 Assessment of Community assistance (transportation, D/C planning, etc.) []  - 0 Additional assistance / Altered mentation []  - 0 Support Surface(s) Assessment (bed, cushion, seat,  etc.) INTERVENTIONS - Wound Cleansing / Measurement X - Simple Wound Cleansing - one wound 1 5 []  - 0 Complex Wound Cleansing - multiple wounds X-  1 5 Wound Imaging (photographs - any number of wounds) []  - 0 Wound Tracing (instead of photographs) X- 1 5 Simple Wound Measurement - one wound []  - 0 Complex Wound Measurement - multiple wounds INTERVENTIONS - Wound Dressings X - Small Wound Dressing one or multiple wounds 1 10 []  - 0 Medium Wound Dressing one or multiple wounds []  - 0 Large Wound Dressing one or multiple wounds X- 1 5 Application of Medications - topical []  - 0 Application of Medications - injection INTERVENTIONS - Miscellaneous []  - 0 External ear exam []  - 0 Specimen Collection (cultures, biopsies, blood, body fluids, etc.) []  - 0 Specimen(s) / Culture(s) sent or taken to Lab for analysis []  - 0 Patient Transfer (multiple staff / Civil Service fast streamer / Similar devices) []  - 0 Simple Staple / Suture removal (25 or less) []  - 0 Complex Staple / Suture removal (26 or more) []  - 0 Hypo / Hyperglycemic Management (close monitor of Blood Glucose) []  - 0 Ankle / Brachial Index (ABI) - do not check if billed separately X- 1 5 Vital Signs Has the patient been seen at the hospital within the last three years: Yes Total Score: 100 Level Of Care: New/Established - Level 3 Electronic Signature(s) Signed: 02/27/2021 11:39:47 AM By: Baruch Gouty RN, BSN Entered By: Baruch Gouty on 02/27/2021 10:28:41 -------------------------------------------------------------------------------- Encounter Discharge Information Details Patient Name: Date of Service: Greg [name] [place] 02/27/2021 10:00 A M Medical Record Number: [id number] Patient Account Number: [id number] Date of Birth/Sex: Treating RN: October 21, 1950 (70 y.o. [name] Primary Care Timo Hartwig: PCP, NO Other Clinician: Referring Erhard Senske: Treating Versie Soave/Extender: [name] in Treatment:  20 Encounter Discharge Information Items Discharge Condition: Stable Ambulatory Status: Cane Discharge Destination: Home Transportation: Private Auto Accompanied By: sister Schedule Follow-up Appointment: Yes Clinical Summary of Care: Patient Declined Electronic Signature(s) Signed: 02/27/2021 11:39:47 AM By: Baruch Gouty RN, BSN Entered By: Baruch Gouty on 02/27/2021 10:38:11 -------------------------------------------------------------------------------- Lower Extremity Assessment Details Patient Name: Date of Service: Greg [name] [place] 02/27/2021 10:00 A M Medical Record Number: [id number] Patient Account Number: [id number] Date of Birth/Sex: Treating RN: 1950/10/08 (71 y.o. Mare Ferrari Primary Care Zuzu Befort: PCP, NO Other Clinician: Referring Neelah Mannings: Treating Sahej Schrieber/Extender: [name] in Treatment: 20 Electronic Signature(s) Signed: 02/27/2021 11:38:13 AM By: Sharyn Creamer RN, BSN Entered By: Sharyn Creamer on 02/27/2021 10:17:49 -------------------------------------------------------------------------------- Multi Wound Chart Details Patient Name: Date of Service: Greg [name] [place] 02/27/2021 10:00 A M Medical Record Number: [id number] Patient Account Number: [id number] Date of Birth/Sex: Treating RN: 1950-11-09 (72 y.o. [name] Primary Care Abdulai Blaylock: PCP, NO Other Clinician: Referring Tamika Nou: Treating Stevon Gough/Extender: [name] in Treatment: 20 Vital Signs Height(in): 68 Pulse(bpm): 109 Weight(lbs): 135 Blood Pressure(mmHg): 144/80 Body Mass Index(BMI): 21 Temperature(F): 98.3 Respiratory Rate(breaths/min): 20 Photos: [1:No Photos Scrotum] [N/A:N/A N/A] Wound Location: [1:Shear/Friction] [N/A:N/A] Wounding Event: [1:Abrasion] [N/A:N/A] Primary Etiology: [1:06/15/2020] [N/A:N/A] Date Acquired: [1:20] [N/A:N/A] Weeks of Treatment: [1:Open] [N/A:N/A] Wound Status: [1:Yes] [N/A:N/A] Clustered Wound: [1:1]  [N/A:N/A] Clustered Quantity: [1:0.3x0.5x0.1] [N/A:N/A] Measurements L x W x D (cm) [1:0.118] [N/A:N/A] A (cm) : rea [1:0.012] [N/A:N/A] Volume (cm) : [1:98.70%] [N/A:N/A] % Reduction in A rea: [1:98.60%] [N/A:N/A] % Reduction in Volume: [1:Full Thickness Without Exposed] [N/A:N/A] Classification: [1:Support Structures Small] [N/A:N/A] Exudate Amount: [1:Serous] [N/A:N/A] Exudate Type: [1:amber] [N/A:N/A] Exudate Color: [1:Flat and Intact] [N/A:N/A] Wound Margin: [1:Large (67-100%)] [N/A:N/A] Granulation Amount: [1:Red] [N/A:N/A] Granulation Quality: [1:None Present (0%)] [N/A:N/A] Necrotic Amount: [1:Fat Layer (Subcutaneous Tissue): Yes N/A] Exposed Structures: [  1:Fascia: No Tendon: No Muscle: No Joint: No Bone: No Medium (34-66%)] [N/A:N/A] Treatment Notes Electronic Signature(s) Signed: 02/27/2021 10:36:19 AM By: Kalman Shan DO Signed: 02/27/2021 11:39:47 AM By: Baruch Gouty RN, BSN Entered By: Kalman Shan on 02/27/2021 10:33:34 -------------------------------------------------------------------------------- Multi-Disciplinary Care Plan Details Patient Name: Date of Service: Greg [name] [place] 02/27/2021 10:00 A M Medical Record Number: [id number] Patient Account Number: [id number] Date of Birth/Sex: Treating RN: January 18, 1951 (71 y.o. [name] Primary Care Aleera Gilcrease: PCP, NO Other Clinician: Referring Kiandra Sanguinetti: Treating Zaidyn Claire/Extender: [name] in Treatment: 20 Multidisciplinary Care Plan reviewed with physician Active Inactive Wound/Skin Impairment Nursing Diagnoses: Impaired tissue integrity Goals: Patient/caregiver will verbalize understanding of skin care regimen Date Initiated: 10/09/2020 Target Resolution Date: 03/09/2021 Goal Status: Active Ulcer/skin breakdown will have a volume reduction of 30% by week 4 Date Initiated: 10/09/2020 Date Inactivated: 11/07/2020 Target Resolution Date: 11/06/2020 Unmet Reason: pt refuses  surgical Goal Status: Unmet reduction of hernia Ulcer/skin breakdown will have a volume reduction of 50% by week 8 Date Initiated: 11/07/2020 Date Inactivated: 12/05/2020 Target Resolution Date: 12/05/2020 Unmet Reason: wound measuring Goal Status: Unmet larger. Interventions: Assess patient/caregiver ability to obtain necessary supplies Assess patient/caregiver ability to perform ulcer/skin care regimen upon admission and as needed Assess ulceration(s) every visit Provide education on ulcer and skin care Treatment Activities: Topical wound management initiated : 10/09/2020 Notes: Electronic Signature(s) Signed: 02/27/2021 11:39:47 AM By: Baruch Gouty RN, BSN Entered By: Baruch Gouty on 02/27/2021 10:25:57 -------------------------------------------------------------------------------- Pain Assessment Details Patient Name: Date of Service: Greg [name] [place] 02/27/2021 10:00 A M Medical Record Number: [id number] Patient Account Number: [id number] Date of Birth/Sex: Treating RN: Jul 03, 1950 (71 y.o. Mare Ferrari Primary Care Renee Beale: PCP, NO Other Clinician: Referring Jaidy Cottam: Treating Vetra Shinall/Extender: [name] in Treatment: 20 Active Problems Location of Pain Severity and Description of Pain Patient Has Paino Yes Site Locations Pain Location: Generalized Pain Duration of the Pain. Constant / Intermittento Intermittent Rate the pain. Current Pain Level: 7 Worst Pain Level: 9 Least Pain Level: 0 Tolerable Pain Level: 2 Character of Pain Describe the Pain: Aching Pain Management and Medication Current Pain Management: Medication: No Cold Application: No Rest: No Massage: No Activity: No T.E.N.S.: No Heat Application: No Leg drop or elevation: No Is the Current Pain Management Adequate: Inadequate How does your wound impact your activities of daily livingo Sleep: No Bathing: No Appetite: No Relationship With Others: No Bladder  Continence: No Emotions: No Bowel Continence: No Work: No Toileting: No Drive: No Dressing: No Hobbies: No Electronic Signature(s) Signed: 02/27/2021 11:38:13 AM By: Sharyn Creamer RN, BSN Entered By: Sharyn Creamer on 02/27/2021 10:16:24 -------------------------------------------------------------------------------- Patient/Caregiver Education Details Patient Name: Date of Service: Greg [name] [place] 1/17/2023andnbsp10:00 A M Medical Record Number: [id number] Patient Account Number: [id number] Date of Birth/Gender: Treating RN: 04-May-1950 (71 y.o. [name] Primary Care Physician: PCP, NO Other Clinician: Referring Physician: Treating Physician/Extender: [name] in Treatment: 20 Education Assessment Education Provided To: Patient Education Topics Provided Wound/Skin Impairment: Methods: Explain/Verbal Responses: Reinforcements needed, State content correctly Electronic Signature(s) Signed: 02/27/2021 11:39:47 AM By: Baruch Gouty RN, BSN Entered By: Baruch Gouty on 02/27/2021 10:26:16 -------------------------------------------------------------------------------- Wound Assessment Details Patient Name: Date of Service: Greg [name] [place] 02/27/2021 10:00 A M Medical Record Number: [id number] Patient Account Number: [id number] Date of Birth/Sex: Treating RN: 1950-03-25 (71 y.o. Mare Ferrari Primary Care Viridiana Spaid: PCP, NO Other Clinician: Referring Soua Caltagirone: Treating Nikolas Casher/Extender: [name] in Treatment: 20 Wound Status Wound  Number: 1 Primary Etiology: Abrasion Wound Location: Scrotum Wound Status: Open Wounding Event: Shear/Friction Date Acquired: 06/15/2020 Weeks Of Treatment: 20 Clustered Wound: Yes Wound Measurements Length: (cm) 0.3 Width: (cm) 0.5 Depth: (cm) 0.1 Clustered Quantity: 1 Area: (cm) 0.118 Volume: (cm) 0.012 % Reduction in Area: 98.7% % Reduction in Volume: 98.6% Epithelialization:  Medium (34-66%) Tunneling: No Undermining: No Wound Description Classification: Full Thickness Without Exposed Support Stru Wound Margin: Flat and Intact Exudate Amount: Small Exudate Type: Serous Exudate Color: amber ctures Foul Odor After Cleansing: No Slough/Fibrino No Wound Bed Granulation Amount: Large (67-100%) Exposed Structure Granulation Quality: Red Fascia Exposed: No Necrotic Amount: None Present (0%) Fat Layer (Subcutaneous Tissue) Exposed: Yes Tendon Exposed: No Muscle Exposed: No Joint Exposed: No Bone Exposed: No Treatment Notes Wound #1 (Scrotum) Cleanser Soap and Water Discharge Instruction: May shower and wash wound with dial antibacterial soap and water prior to dressing change. Peri-Wound Care Zinc Oxide Ointment 30g tube Discharge Instruction: Apply Zinc Oxide as needed to periwound with each dressing change Topical Primary Dressing KerraCel Ag Gelling Fiber Dressing, 4x5 in (silver alginate) Discharge Instruction: Apply silver alginate to wound bed as instructed Secondary Dressing Woven Gauze Sponge, Non-Sterile 4x4 in Discharge Instruction: Apply over primary dressing as directed. ABD Pad, 8x10 Discharge Instruction: Apply over primary dressing as directed. Secured With [employer], 4.5x3.1 (in/yd) Discharge Instruction: Secure with Kerlix as directed. fish netting Discharge Instruction: to hold ABD pad or kerlix in place. Compression Wrap Compression Stockings Add-Ons Electronic Signature(s) Signed: 02/27/2021 11:38:13 AM By: Sharyn Creamer RN, BSN Entered By: Sharyn Creamer on 02/27/2021 10:19:53 -------------------------------------------------------------------------------- Vitals Details Patient Name: Date of Service: Greg [name] [place] 02/27/2021 10:00 A M Medical Record Number: [id number] Patient Account Number: [id number] Date of Birth/Sex: Treating RN: [birth year] (71 y.o. Mare Ferrari Primary Care Chay Mazzoni: PCP,  NO Other Clinician: Referring Aniello Christopoulos: Treating Taquana Bartley/Extender: [name] in Treatment: 20 Vital Signs Time Taken: 10:17 Temperature (F): 98.3 Height (in): 68 Pulse (bpm): 109 Weight (lbs): 135 Respiratory Rate (breaths/min): 20 Body Mass Index (BMI): 20.5 Blood Pressure (mmHg): 144/80 Reference Range: 80 - 120 mg / dl Electronic Signature(s) Signed: 02/27/2021 11:38:13 AM By: Sharyn Creamer RN, BSN Entered By: Sharyn Creamer on 02/27/2021 10:17:31
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
## 2021-03-13 ENCOUNTER — Encounter (HOSPITAL_BASED_OUTPATIENT_CLINIC_OR_DEPARTMENT_OTHER): Payer: Medicare Other | Admitting: Internal Medicine

## 2021-03-13 ENCOUNTER — Other Ambulatory Visit: Payer: Self-pay

## 2021-03-13 DIAGNOSIS — S3130XD Unspecified open wound of scrotum and testes, subsequent encounter: Secondary | ICD-10-CM

## 2021-03-13 DIAGNOSIS — K4090 Unilateral inguinal hernia, without obstruction or gangrene, not specified as recurrent: Secondary | ICD-10-CM

## 2021-03-13 DIAGNOSIS — S3130XA Unspecified open wound of scrotum and testes, initial encounter: Secondary | ICD-10-CM | POA: Diagnosis not present
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
## 2021-03-13 NOTE — Progress Notes (Signed)
MERCURY, ROCK ([id number]) Visit Report for 03/13/2021 Arrival Information Details Patient Name: Date of Service: Greg [name] [facility] [place] 03/13/2021 10:15 A M Medical Record Number: [id number] Patient Account Number: [id number] Date of Birth/Sex: Treating RN: 06-29-1950 (71 y.o. Collene Gobble Primary Care Derwin Reddy: PCP, NO Other Clinician: Referring Izora Benn: Treating Elvert Cumpton/Extender: [name] in Treatment: 22 Visit Information History Since Last Visit Added or deleted any medications: No Patient Arrived: Cane Any new allergies or adverse reactions: No Arrival Time: 10:28 Had a fall or experienced change in No Accompanied By: Self activities of daily living that may affect Transfer Assistance: None risk of falls: Patient Requires Transmission-Based Precautions: No Signs or symptoms of abuse/neglect since last visito No Patient Has Alerts: Yes Hospitalized since last visit: No Implantable device outside of the clinic excluding No cellular tissue based products placed in the center since last visit: Pain Present Now: No Electronic Signature(s) Signed: 03/13/2021 5:33:56 PM By: Dellie Catholic RN Entered By: Dellie Catholic on 03/13/2021 10:28:33 -------------------------------------------------------------------------------- Clinic Level of Care Assessment Details Patient Name: Date of Service: Greg Edwards [facility] [place] 03/13/2021 10:15 A M Medical Record Number: [id number] Patient Account Number: [id number] Date of Birth/Sex: Treating RN: [birth year] (71 y.o. Collene Gobble Primary Care Andalyn Heckstall: PCP, NO Other Clinician: Referring Rylen Hou: Treating Tonnya Garbett/Extender: [name] in Treatment: 22 Clinic Level of Care Assessment Items TOOL 4 Quantity Score X- 1 0 Use when only an EandM is performed on FOLLOW-UP visit ASSESSMENTS - Nursing Assessment / Reassessment X- 1 10 Reassessment of Co-morbidities (includes updates in patient status) X- 1  5 Reassessment of Adherence to Treatment Plan ASSESSMENTS - Wound and Skin A ssessment / Reassessment X - Simple Wound Assessment / Reassessment - one wound 1 5 []  - 0 Complex Wound Assessment / Reassessment - multiple wounds []  - 0 Dermatologic / Skin Assessment (not related to wound area) ASSESSMENTS - Focused Assessment []  - 0 Circumferential Edema Measurements - multi extremities []  - 0 Nutritional Assessment / Counseling / Intervention []  - 0 Lower Extremity Assessment (monofilament, tuning fork, pulses) []  - 0 Peripheral Arterial Disease Assessment (using hand held doppler) ASSESSMENTS - Ostomy and/or Continence Assessment and Care []  - 0 Incontinence Assessment and Management []  - 0 Ostomy Care Assessment and Management (repouching, etc.) PROCESS - Coordination of Care X - Simple Patient / Family Education for ongoing care 1 15 []  - 0 Complex (extensive) Patient / Family Education for ongoing care X- 1 10 Staff obtains Programmer, systems, Records, T Results / Process Orders est []  - 0 Staff telephones HHA, Nursing Homes / Clarify orders / etc []  - 0 Routine Transfer to another Facility (non-emergent condition) []  - 0 Routine Hospital Admission (non-emergent condition) []  - 0 New Admissions / Biomedical engineer / Ordering NPWT Apligraf, etc. , []  - 0 Emergency Hospital Admission (emergent condition) X- 1 10 Simple Discharge Coordination []  - 0 Complex (extensive) Discharge Coordination PROCESS - Special Needs []  - 0 Pediatric / Minor Patient Management []  - 0 Isolation Patient Management []  - 0 Hearing / Language / Visual special needs []  - 0 Assessment of Community assistance (transportation, D/C planning, etc.) []  - 0 Additional assistance / Altered mentation []  - 0 Support Surface(s) Assessment (bed, cushion, seat, etc.) INTERVENTIONS - Wound Cleansing / Measurement X - Simple Wound Cleansing - one wound 1 5 []  - 0 Complex Wound Cleansing - multiple  wounds X- 1 5 Wound Imaging (photographs - any number of wounds) []  - 0 Wound Tracing (instead of  photographs) X- 1 5 Simple Wound Measurement - one wound []  - 0 Complex Wound Measurement - multiple wounds INTERVENTIONS - Wound Dressings X - Small Wound Dressing one or multiple wounds 1 10 []  - 0 Medium Wound Dressing one or multiple wounds []  - 0 Large Wound Dressing one or multiple wounds []  - 0 Application of Medications - topical []  - 0 Application of Medications - injection INTERVENTIONS - Miscellaneous []  - 0 External ear exam []  - 0 Specimen Collection (cultures, biopsies, blood, body fluids, etc.) []  - 0 Specimen(s) / Culture(s) sent or taken to Lab for analysis []  - 0 Patient Transfer (multiple staff / Harrel Lemon Lift / Similar devices) []  - 0 Simple Staple / Suture removal (25 or less) []  - 0 Complex Staple / Suture removal (26 or more) []  - 0 Hypo / Hyperglycemic Management (close monitor of Blood Glucose) []  - 0 Ankle / Brachial Index (ABI) - do not check if billed separately X- 1 5 Vital Signs Has the patient been seen at the hospital within the last three years: Yes Total Score: 85 Level Of Care: New/Established - Level 3 Electronic Signature(s) Signed: 03/13/2021 5:33:56 PM By: Dellie Catholic RN Entered By: Dellie Catholic on 03/13/2021 11:12:21 -------------------------------------------------------------------------------- Encounter Discharge Information Details Patient Name: Date of Service: Greg [name] [place] 03/13/2021 10:15 A M Medical Record Number: [id number] Patient Account Number: [id number] Date of Birth/Sex: Treating RN: [birth year] (71 y.o. Collene Gobble Primary Care Spyridon Hornstein: PCP, NO Other Clinician: Referring Rada Zegers: Treating Iokepa Geffre/Extender: [name] in Treatment: 22 Encounter Discharge Information Items Discharge Condition: Stable Ambulatory Status: Cane Discharge Destination: Home Transportation: Private  Auto Accompanied By: self Schedule Follow-up Appointment: Yes Clinical Summary of Care: Patient Declined Electronic Signature(s) Signed: 03/13/2021 5:33:56 PM By: Dellie Catholic RN Entered By: Dellie Catholic on 03/13/2021 11:26:12 -------------------------------------------------------------------------------- Lower Extremity Assessment Details Patient Name: Date of Service: Greg Edwards [facility] [place] 03/13/2021 10:15 A M Medical Record Number: [id number] Patient Account Number: [id number] Date of Birth/Sex: Treating RN: [birth year] (71 y.o. Collene Gobble Primary Care Zanaiya Calabria: PCP, NO Other Clinician: Referring Naoma Boxell: Treating Reade Trefz/Extender: [name] in Treatment: 22 Electronic Signature(s) Signed: 03/13/2021 5:33:56 PM By: Dellie Catholic RN Entered By: Dellie Catholic on 03/13/2021 10:34:06 -------------------------------------------------------------------------------- Multi Wound Chart Details Patient Name: Date of Service: Greg [name] [place] 03/13/2021 10:15 A M Medical Record Number: [id number] Patient Account Number: [id number] Date of Birth/Sex: Treating RN: 1950/05/14 (71 y.o. [name] Primary Care Ragina Fenter: PCP, NO Other Clinician: Referring Leslyn Monda: Treating Narcissus Detwiler/Extender: [name] in Treatment: 22 Vital Signs Height(in): 68 Pulse(bpm): 106 Weight(lbs): 135 Blood Pressure(mmHg): 152/94 Body Mass Index(BMI): 20.5 Temperature(F): 98.2 Respiratory Rate(breaths/min): 20 Photos: [N/A:N/A] Scrotum N/A N/A Wound Location: Shear/Friction N/A N/A Wounding Event: Abrasion N/A N/A Primary Etiology: 06/15/2020 N/A N/A Date Acquired: 22 N/A N/A Weeks of Treatment: Open N/A N/A Wound Status: No N/A N/A Wound Recurrence: Yes N/A N/A Clustered Wound: 1 N/A N/A Clustered Quantity: 0.4x0.7x0.1 N/A N/A Measurements L x W x D (cm) 0.22 N/A N/A A (cm) : rea 0.022 N/A N/A Volume (cm) : 97.50% N/A N/A % Reduction in A  rea: 97.50% N/A N/A % Reduction in Volume: Full Thickness Without Exposed N/A N/A Classification: Support Structures Small N/A N/A Exudate Amount: Serous N/A N/A Exudate Type: amber N/A N/A Exudate Color: Flat and Intact N/A N/A Wound Margin: Large (67-100%) N/A N/A Granulation Amount: Pink N/A N/A Granulation Quality: Small (1-33%) N/A N/A Necrotic Amount: Fat Layer (Subcutaneous Tissue): Yes  N/A N/A Exposed Structures: Fascia: No Tendon: No Muscle: No Joint: No Bone: No Medium (34-66%) N/A N/A Epithelialization: Treatment Notes Electronic Signature(s) Signed: 03/13/2021 11:48:03 AM By: Kalman Shan DO Signed: 03/13/2021 5:08:46 PM By: Deon Pilling RN, BSN Entered By: Kalman Shan on 03/13/2021 11:23:14 -------------------------------------------------------------------------------- [place] Details Patient Name: Date of Service: Greg [name] [place] 03/13/2021 10:15 A M Medical Record Number: [id number] Patient Account Number: [id number] Date of Birth/Sex: Treating RN: 06-25-1950 (71 y.o. Collene Gobble Primary Care Seriah Brotzman: PCP, NO Other Clinician: Referring Scout Guyett: Treating Kamylah Manzo/Extender: [name] in Treatment: 22 Multidisciplinary Care Plan reviewed with physician Active Inactive Wound/Skin Impairment Nursing Diagnoses: Impaired tissue integrity Goals: Patient/caregiver will verbalize understanding of skin care regimen Date Initiated: 10/09/2020 Target Resolution Date: 04/09/2021 Goal Status: Active Ulcer/skin breakdown will have a volume reduction of 30% by week 4 Date Initiated: 10/09/2020 Date Inactivated: 11/07/2020 Target Resolution Date: 11/06/2020 Unmet Reason: pt refuses surgical Goal Status: Unmet reduction of hernia Ulcer/skin breakdown will have a volume reduction of 50% by week 8 Date Initiated: 11/07/2020 Date Inactivated: 12/05/2020 Target Resolution Date: 12/05/2020 Unmet Reason: wound  measuring Goal Status: Unmet larger. Interventions: Assess patient/caregiver ability to obtain necessary supplies Assess patient/caregiver ability to perform ulcer/skin care regimen upon admission and as needed Assess ulceration(s) every visit Provide education on ulcer and skin care Treatment Activities: Topical wound management initiated : 10/09/2020 Notes: Electronic Signature(s) Signed: 03/13/2021 5:33:56 PM By: Dellie Catholic RN Entered By: Dellie Catholic on 03/13/2021 10:40:29 -------------------------------------------------------------------------------- Pain Assessment Details Patient Name: Date of Service: Greg [name] [place] 03/13/2021 10:15 A M Medical Record Number: [id number] Patient Account Number: [id number] Date of Birth/Sex: Treating RN: April 16, 1950 (71 y.o. Collene Gobble Primary Care Dajanee Voorheis: PCP, NO Other Clinician: Referring Cathlyn Tersigni: Treating Ashaunte Standley/Extender: [name] in Treatment: 22 Active Problems Location of Pain Severity and Description of Pain Patient Has Paino No Site Locations Pain Management and Medication Current Pain Management: Electronic Signature(s) Signed: 03/13/2021 5:33:56 PM By: Dellie Catholic RN Entered By: Dellie Catholic on 03/13/2021 10:30:49 -------------------------------------------------------------------------------- Patient/Caregiver Education Details Patient Name: Date of Service: Greg [name] [place] 1/31/2023andnbsp10:15 A M Medical Record Number: [id number] Patient Account Number: [id number] Date of Birth/Gender: Treating RN: August 05, 1950 (71 y.o. Collene Gobble Primary Care Physician: PCP, NO Other Clinician: Referring Physician: Treating Physician/Extender: [name] in Treatment: 22 Education Assessment Education Provided To: Patient Education Topics Provided Wound/Skin Impairment: Methods: Explain/Verbal Responses: Return demonstration correctly Electronic  Signature(s) Signed: 03/13/2021 5:33:56 PM By: Dellie Catholic RN Entered By: Dellie Catholic on 03/13/2021 10:40:48 -------------------------------------------------------------------------------- Wound Assessment Details Patient Name: Date of Service: Greg [name] [place] 03/13/2021 10:15 A M Medical Record Number: [id number] Patient Account Number: [id number] Date of Birth/Sex: Treating RN: 1950/04/01 (71 y.o. Collene Gobble Primary Care Tran Randle: PCP, NO Other Clinician: Referring Pratham Cassatt: Treating Yittel Emrich/Extender: [name] in Treatment: 22 Wound Status Wound Number: 1 Primary Etiology: Abrasion Wound Location: Scrotum Wound Status: Open Wounding Event: Shear/Friction Date Acquired: 06/15/2020 Weeks Of Treatment: 22 Clustered Wound: Yes Photos Wound Measurements Length: (cm) 0.4 Width: (cm) 0.7 Depth: (cm) 0.1 Clustered Quantity: 1 Area: (cm) 0.22 Volume: (cm) 0.022 % Reduction in Area: 97.5% % Reduction in Volume: 97.5% Epithelialization: Medium (34-66%) Tunneling: No Undermining: No Wound Description Classification: Full Thickness Without Exposed Support Structures Wound Margin: Flat and Intact Exudate Amount: Small Exudate Type: Serous Exudate Color: amber Foul Odor After Cleansing: No Slough/Fibrino Yes Wound Bed Granulation Amount: Large (67-100%) Exposed Structure Granulation Quality: Pink  Fascia Exposed: No Necrotic Amount: Small (1-33%) Fat Layer (Subcutaneous Tissue) Exposed: Yes Necrotic Quality: Adherent Slough Tendon Exposed: No Muscle Exposed: No Joint Exposed: No Bone Exposed: No Treatment Notes Wound #1 (Scrotum) Cleanser Soap and Water Discharge Instruction: May shower and wash wound with dial antibacterial soap and water prior to dressing change. Peri-Wound Care Zinc Oxide Ointment 30g tube Discharge Instruction: Apply Zinc Oxide as needed to periwound with each dressing change Topical Primary Dressing KerraCel Ag  Gelling Fiber Dressing, 4x5 in (silver alginate) Discharge Instruction: Apply silver alginate to wound bed as instructed Secondary Dressing Woven Gauze Sponge, Non-Sterile 4x4 in Discharge Instruction: Apply over primary dressing as directed. ABD Pad, 8x10 Discharge Instruction: Apply over primary dressing as directed. Secured With [employer], 4.5x3.1 (in/yd) Discharge Instruction: Secure with Kerlix as directed. fish netting Discharge Instruction: to hold ABD pad or kerlix in place. Compression Wrap Compression Stockings Add-Ons Electronic Signature(s) Signed: 03/13/2021 5:33:56 PM By: Dellie Catholic RN Entered By: Dellie Catholic on 03/13/2021 10:45:41 -------------------------------------------------------------------------------- Vitals Details Patient Name: Date of Service: Greg [name] [place] 03/13/2021 10:15 A M Medical Record Number: [id number] Patient Account Number: [id number] Date of Birth/Sex: Treating RN: January 23, 1951 (71 y.o. Collene Gobble Primary Care Susumu Hackler: PCP, NO Other Clinician: Referring Vaneza Pickart: Treating Loreto Loescher/Extender: [name] in Treatment: 22 Vital Signs Time Taken: 10:28 Temperature (F): 98.2 Height (in): 68 Pulse (bpm): 106 Weight (lbs): 135 Respiratory Rate (breaths/min): 20 Body Mass Index (BMI): 20.5 Blood Pressure (mmHg): 152/94 Reference Range: 80 - 120 mg / dl Electronic Signature(s) Signed: 03/13/2021 5:33:56 PM By: Dellie Catholic RN Entered By: Dellie Catholic on 03/13/2021 10:30:38

## 2021-03-27 ENCOUNTER — Other Ambulatory Visit: Payer: Self-pay

## 2021-03-27 ENCOUNTER — Encounter (HOSPITAL_BASED_OUTPATIENT_CLINIC_OR_DEPARTMENT_OTHER): Payer: Medicare Other | Attending: Internal Medicine | Admitting: Internal Medicine

## 2021-03-27 DIAGNOSIS — S3130XD Unspecified open wound of scrotum and testes, subsequent encounter: Secondary | ICD-10-CM

## 2021-03-27 DIAGNOSIS — Z09 Encounter for follow-up examination after completed treatment for conditions other than malignant neoplasm: Secondary | ICD-10-CM | POA: Insufficient documentation

## 2021-03-27 DIAGNOSIS — K4090 Unilateral inguinal hernia, without obstruction or gangrene, not specified as recurrent: Secondary | ICD-10-CM | POA: Diagnosis not present

## 2021-03-27 DIAGNOSIS — Z872 Personal history of diseases of the skin and subcutaneous tissue: Secondary | ICD-10-CM | POA: Diagnosis not present
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
## 2021-03-27 NOTE — Progress Notes (Signed)
Greg [name], Greg [name] ([id number]) Visit Report for 03/27/2021 Arrival Information Details Patient Name: Date of Service: Greg [name] [facility] [place] 03/27/2021 10:15 A M Medical Record Number: [id number] Patient Account Number: [id number] Date of Birth/Sex: Treating RN: 02-Jun-1950 (71 y.o. [name], [name] Primary Care Kielee Care: PCP, NO Other Clinician: Referring Angelyse Heslin: Treating Gagan Dillion/Extender: [name] in Treatment: 24 Visit Information History Since Last Visit Added or deleted any medications: No Patient Arrived: Cane Any new allergies or adverse reactions: No Arrival Time: 10:16 Had a fall or experienced change in No Accompanied By: self activities of daily living that may affect Transfer Assistance: None risk of falls: Patient Identification Verified: Yes Signs or symptoms of abuse/neglect since last visito No Secondary Verification Process Completed: Yes Hospitalized since last visit: No Patient Requires Transmission-Based Precautions: No Implantable device outside of the clinic excluding No Patient Has Alerts: Yes cellular tissue based products placed in the center since last visit: Has Dressing in Place as Prescribed: Yes Pain Present Now: No Electronic Signature(s) Signed: 03/27/2021 5:28:21 PM By: Deon Pilling RN, BSN Entered By: Deon Pilling on 03/27/2021 10:17:02 -------------------------------------------------------------------------------- Clinic Level of Care Assessment Details Patient Name: Date of Service: Greg [name] [facility] [place] 03/27/2021 10:15 A M Medical Record Number: [id number] Patient Account Number: [id number] Date of Birth/Sex: Treating RN: 1950/09/15 (71 y.o. [name] Primary Care Asahel Risden: PCP, NO Other Clinician: Referring Lamia Mariner: Treating Donovyn Guidice/Extender: [name] in Treatment: 24 Clinic Level of Care Assessment Items TOOL 4 Quantity Score X- 1 0 Use when only an EandM is performed on FOLLOW-UP visit ASSESSMENTS -  Nursing Assessment / Reassessment X- 1 10 Reassessment of Co-morbidities (includes updates in patient status) X- 1 5 Reassessment of Adherence to Treatment Plan ASSESSMENTS - Wound and Skin A ssessment / Reassessment X - Simple Wound Assessment / Reassessment - one wound 1 5 []  - 0 Complex Wound Assessment / Reassessment - multiple wounds X- 1 10 Dermatologic / Skin Assessment (not related to wound area) ASSESSMENTS - Focused Assessment []  - 0 Circumferential Edema Measurements - multi extremities []  - 0 Nutritional Assessment / Counseling / Intervention []  - 0 Lower Extremity Assessment (monofilament, tuning fork, pulses) []  - 0 Peripheral Arterial Disease Assessment (using hand held doppler) ASSESSMENTS - Ostomy and/or Continence Assessment and Care []  - 0 Incontinence Assessment and Management []  - 0 Ostomy Care Assessment and Management (repouching, etc.) PROCESS - Coordination of Care X - Simple Patient / Family Education for ongoing care 1 15 []  - 0 Complex (extensive) Patient / Family Education for ongoing care X- 1 10 Staff obtains Programmer, systems, Records, T Results / Process Orders est X- 1 10 Staff telephones HHA, Nursing Homes / Clarify orders / etc []  - 0 Routine Transfer to another Facility (non-emergent condition) []  - 0 Routine Hospital Admission (non-emergent condition) []  - 0 New Admissions / Biomedical engineer / Ordering NPWT Apligraf, etc. , []  - 0 Emergency Hospital Admission (emergent condition) X- 1 10 Simple Discharge Coordination []  - 0 Complex (extensive) Discharge Coordination PROCESS - Special Needs []  - 0 Pediatric / Minor Patient Management []  - 0 Isolation Patient Management []  - 0 Hearing / Language / Visual special needs []  - 0 Assessment of Community assistance (transportation, D/C planning, etc.) []  - 0 Additional assistance / Altered mentation []  - 0 Support Surface(s) Assessment (bed, cushion, seat, etc.) INTERVENTIONS -  Wound Cleansing / Measurement X - Simple Wound Cleansing - one wound 1 5 []  - 0 Complex Wound Cleansing - multiple wounds X-  1 5 Wound Imaging (photographs - any number of wounds) []  - 0 Wound Tracing (instead of photographs) X- 1 5 Simple Wound Measurement - one wound []  - 0 Complex Wound Measurement - multiple wounds INTERVENTIONS - Wound Dressings []  - 0 Small Wound Dressing one or multiple wounds []  - 0 Medium Wound Dressing one or multiple wounds []  - 0 Large Wound Dressing one or multiple wounds []  - 0 Application of Medications - topical []  - 0 Application of Medications - injection INTERVENTIONS - Miscellaneous []  - 0 External ear exam []  - 0 Specimen Collection (cultures, biopsies, blood, body fluids, etc.) []  - 0 Specimen(s) / Culture(s) sent or taken to Lab for analysis []  - 0 Patient Transfer (multiple staff / Civil Service fast streamer / Similar devices) []  - 0 Simple Staple / Suture removal (25 or less) []  - 0 Complex Staple / Suture removal (26 or more) []  - 0 Hypo / Hyperglycemic Management (close monitor of Blood Glucose) []  - 0 Ankle / Brachial Index (ABI) - do not check if billed separately X- 1 5 Vital Signs Has the patient been seen at the hospital within the last three years: Yes Total Score: 95 Level Of Care: New/Established - Level 3 Electronic Signature(s) Signed: 03/27/2021 5:28:21 PM By: Deon Pilling RN, BSN Entered By: Deon Pilling on 03/27/2021 10:52:58 -------------------------------------------------------------------------------- Encounter Discharge Information Details Patient Name: Date of Service: Greg [name] [place] 03/27/2021 10:15 A M Medical Record Number: [id number] Patient Account Number: [id number] Date of Birth/Sex: Treating RN: [birth year] (71 y.o. [name] Primary Care Lexi Conaty: PCP, NO Other Clinician: Referring Sheryn Aldaz: Treating Braedyn Riggle/Extender: [name] in Treatment: 24 Encounter Discharge Information  Items Discharge Condition: Stable Ambulatory Status: Cane Discharge Destination: Home Transportation: Private Auto Accompanied By: self Schedule Follow-up Appointment: No Clinical Summary of Care: Electronic Signature(s) Signed: 03/27/2021 5:28:21 PM By: Deon Pilling RN, BSN Entered By: Deon Pilling on 03/27/2021 10:53:28 -------------------------------------------------------------------------------- Lower Extremity Assessment Details Patient Name: Date of Service: Greg [name] [place] 03/27/2021 10:15 A M Medical Record Number: [id number] Patient Account Number: [id number] Date of Birth/Sex: Treating RN: 1950/07/04 (71 y.o. [name] Primary Care Ava Tangney: PCP, NO Other Clinician: Referring Bronnie Vasseur: Treating Bular Hickok/Extender: [name] in Treatment: 24 Electronic Signature(s) Signed: 03/27/2021 5:28:21 PM By: Deon Pilling RN, BSN Entered By: Deon Pilling on 03/27/2021 10:17:28 -------------------------------------------------------------------------------- Multi Wound Chart Details Patient Name: Date of Service: Greg [name] [place] 03/27/2021 10:15 A M Medical Record Number: [id number] Patient Account Number: [id number] Date of Birth/Sex: Treating RN: 08/29/1950 (71 y.o. [name] Primary Care Jerret Mcbane: PCP, NO Other Clinician: Referring Henrine Hayter: Treating Xzavian Semmel/Extender: [name] in Treatment: 24 Vital Signs Height(in): 68 Pulse(bpm): 87 Weight(lbs): 135 Blood Pressure(mmHg): 126/79 Body Mass Index(BMI): 20.5 Temperature(F): 98.6 Respiratory Rate(breaths/min): 20 Photos: [N/A:N/A] Scrotum N/A N/A Wound Location: Shear/Friction N/A N/A Wounding Event: Abrasion N/A N/A Primary Etiology: 06/15/2020 N/A N/A Date Acquired: 24 N/A N/A Weeks of Treatment: Open N/A N/A Wound Status: No N/A N/A Wound Recurrence: Yes N/A N/A Clustered Wound: 0x0x0 N/A N/A Measurements L x W x D (cm) 0 N/A N/A A (cm) : rea 0 N/A  N/A Volume (cm) : 100.00% N/A N/A % Reduction in A rea: 100.00% N/A N/A % Reduction in Volume: Full Thickness Without Exposed N/A N/A Classification: Support Structures None Present N/A N/A Exudate Amount: Flat and Intact N/A N/A Wound Margin: None Present (0%) N/A N/A Granulation Amount: None Present (0%) N/A N/A Necrotic Amount: Fascia: No N/A N/A Exposed  Structures: Fat Layer (Subcutaneous Tissue): No Tendon: No Muscle: No Joint: No Bone: No Large (67-100%) N/A N/A Epithelialization: Treatment Notes Electronic Signature(s) Signed: 03/27/2021 12:26:39 PM By: Kalman Shan DO Signed: 03/27/2021 5:28:21 PM By: Deon Pilling RN, BSN Entered By: Kalman Shan on 03/27/2021 12:21:11 -------------------------------------------------------------------------------- Multi-Disciplinary Care Plan Details Patient Name: Date of Service: Greg [name] [place] 03/27/2021 10:15 A M Medical Record Number: [id number] Patient Account Number: [id number] Date of Birth/Sex: Treating RN: [birth year] (70 y.o. [name] Primary Care Malary Aylesworth: PCP, NO Other Clinician: Referring Kilani Joffe: Treating Laurens Matheny/Extender: [name] in Treatment: 24 Multidisciplinary Care Plan reviewed with physician Active Inactive Electronic Signature(s) Signed: 03/27/2021 5:28:21 PM By: Deon Pilling RN, BSN Entered By: Deon Pilling on 03/27/2021 10:25:43 -------------------------------------------------------------------------------- Pain Assessment Details Patient Name: Date of Service: Greg [name] [place] 03/27/2021 10:15 A M Medical Record Number: [id number] Patient Account Number: [id number] Date of Birth/Sex: Treating RN: 01/21/1951 (71 y.o. [name] Primary Care Arslan Kier: PCP, NO Other Clinician: Referring Salayah Meares: Treating Dakia Schifano/Extender: [name] in Treatment: 24 Active Problems Location of Pain Severity and Description of Pain Patient Has Paino  No Site Locations Rate the pain. Current Pain Level: 0 Pain Management and Medication Current Pain Management: Medication: No Cold Application: No Rest: No Massage: No Activity: No T.E.N.S.: No Heat Application: No Leg drop or elevation: No Is the Current Pain Management Adequate: Adequate How does your wound impact your activities of daily livingo Sleep: No Bathing: No Appetite: No Relationship With Others: No Bladder Continence: No Emotions: No Bowel Continence: No Work: No Toileting: No Drive: No Dressing: No Hobbies: No Engineer, maintenance) Signed: 03/27/2021 5:28:21 PM By: Deon Pilling RN, BSN Entered By: Deon Pilling on 03/27/2021 10:17:24 -------------------------------------------------------------------------------- Patient/Caregiver Education Details Patient Name: Date of Service: Greg [name] [place] 2/14/2023andnbsp10:15 A M Medical Record Number: [id number] Patient Account Number: [id number] Date of Birth/Gender: Treating RN: 1950-08-06 (71 y.o. [name] Primary Care Physician: PCP, NO Other Clinician: Referring Physician: Treating Physician/Extender: [name] in Treatment: 24 Education Assessment Education Provided To: Patient Education Topics Provided Wound/Skin Impairment: Handouts: Skin Care Do's and Dont's Methods: Explain/Verbal Responses: Reinforcements needed Electronic Signature(s) Signed: 03/27/2021 5:28:21 PM By: Deon Pilling RN, BSN Entered By: Deon Pilling on 03/27/2021 10:25:59 -------------------------------------------------------------------------------- Wound Assessment Details Patient Name: Date of Service: Greg [name] [place] 03/27/2021 10:15 A M Medical Record Number: [id number] Patient Account Number: [id number] Date of Birth/Sex: Treating RN: [birth year] (71 y.o. [name] Primary Care Kariel Skillman: PCP, NO Other Clinician: Referring Yama Nielson: Treating Silvana Holecek/Extender: [name] in Treatment: 24 Wound Status Wound Number: 1 Primary Etiology: Abrasion Wound Location: Scrotum Wound Status: Open Wounding Event: Shear/Friction Date Acquired: 06/15/2020 Weeks Of Treatment: 24 Clustered Wound: Yes Photos Wound Measurements Length: (cm) Width: (cm) Depth: (cm) Area: (cm) Volume: (cm) 0 % Reduction in Area: 100% 0 % Reduction in Volume: 100% 0 Epithelialization: Large (67-100%) 0 Tunneling: No 0 Undermining: No Wound Description Classification: Full Thickness Without Exposed Support Structures Wound Margin: Flat and Intact Exudate Amount: None Present Foul Odor After Cleansing: No Slough/Fibrino No Wound Bed Granulation Amount: None Present (0%) Exposed Structure Necrotic Amount: None Present (0%) Fascia Exposed: No Fat Layer (Subcutaneous Tissue) Exposed: No Tendon Exposed: No Muscle Exposed: No Joint Exposed: No Bone Exposed: No Electronic Signature(s) Signed: 03/27/2021 5:28:21 PM By: Deon Pilling RN, BSN Entered By: Deon Pilling on 03/27/2021 10:25:09 -------------------------------------------------------------------------------- Vitals Details Patient Name: Date of Service: Greg [name], Greg [name] [facility] [place] 03/27/2021 10:15  A M Medical Record Number: [id number] Patient Account Number: [id number] Date of Birth/Sex: Treating RN: 1950/07/05 (71 y.o. [name] Primary Care Cobi Aldape: PCP, NO Other Clinician: Referring Alvis Edgell: Treating Stalin Gruenberg/Extender: [name] in Treatment: 24 Vital Signs Time Taken: 10:15 Temperature (F): 98.6 Height (in): 68 Pulse (bpm): 87 Weight (lbs): 135 Respiratory Rate (breaths/min): 20 Body Mass Index (BMI): 20.5 Blood Pressure (mmHg): 126/79 Reference Range: 80 - 120 mg / dl Electronic Signature(s) Signed: 03/27/2021 5:28:21 PM By: Deon Pilling RN, BSN Entered By: Deon Pilling on 03/27/2021 10:17:14

## 2021-11-06 DIAGNOSIS — I10 Essential (primary) hypertension: Secondary | ICD-10-CM | POA: Insufficient documentation

## 2021-11-06 DIAGNOSIS — R634 Abnormal weight loss: Secondary | ICD-10-CM | POA: Insufficient documentation

## 2021-11-06 DIAGNOSIS — T7500XA Unspecified effects of lightning, initial encounter: Secondary | ICD-10-CM | POA: Insufficient documentation

## 2021-11-06 DIAGNOSIS — I714 Abdominal aortic aneurysm, without rupture, unspecified: Secondary | ICD-10-CM | POA: Insufficient documentation

## 2021-11-06 DIAGNOSIS — I252 Old myocardial infarction: Secondary | ICD-10-CM | POA: Insufficient documentation

## 2021-11-06 DIAGNOSIS — K089 Disorder of teeth and supporting structures, unspecified: Secondary | ICD-10-CM | POA: Insufficient documentation

## 2021-11-15 ENCOUNTER — Encounter: Payer: Self-pay | Admitting: Gastroenterology

## 2021-12-18 ENCOUNTER — Emergency Department (HOSPITAL_COMMUNITY): Payer: Medicare Other

## 2021-12-18 ENCOUNTER — Inpatient Hospital Stay (HOSPITAL_COMMUNITY)
Admission: EM | Admit: 2021-12-18 | Discharge: 2021-12-24 | DRG: 377 | Disposition: A | Discharge status: Home Health | Payer: Medicare Other | Attending: Internal Medicine | Admitting: Internal Medicine

## 2021-12-18 ENCOUNTER — Ambulatory Visit (INDEPENDENT_AMBULATORY_CARE_PROVIDER_SITE_OTHER): Payer: Medicare Other | Admitting: Gastroenterology

## 2021-12-18 ENCOUNTER — Encounter (HOSPITAL_COMMUNITY): Payer: Self-pay

## 2021-12-18 ENCOUNTER — Encounter: Payer: Self-pay | Admitting: Gastroenterology

## 2021-12-18 ENCOUNTER — Telehealth: Payer: Self-pay | Admitting: *Deleted

## 2021-12-18 ENCOUNTER — Other Ambulatory Visit (INDEPENDENT_AMBULATORY_CARE_PROVIDER_SITE_OTHER): Payer: Medicare Other

## 2021-12-18 ENCOUNTER — Telehealth: Payer: Self-pay

## 2021-12-18 ENCOUNTER — Other Ambulatory Visit: Payer: Self-pay

## 2021-12-18 VITALS — BP 120/82 | HR 98 | Ht 67.0 in | Wt 132.0 lb

## 2021-12-18 DIAGNOSIS — I214 Non-ST elevation (NSTEMI) myocardial infarction: Secondary | ICD-10-CM

## 2021-12-18 DIAGNOSIS — K5909 Other constipation: Secondary | ICD-10-CM | POA: Diagnosis not present

## 2021-12-18 DIAGNOSIS — R7989 Other specified abnormal findings of blood chemistry: Secondary | ICD-10-CM

## 2021-12-18 DIAGNOSIS — I779 Disorder of arteries and arterioles, unspecified: Secondary | ICD-10-CM | POA: Diagnosis present

## 2021-12-18 DIAGNOSIS — I959 Hypotension, unspecified: Secondary | ICD-10-CM | POA: Diagnosis not present

## 2021-12-18 DIAGNOSIS — I252 Old myocardial infarction: Secondary | ICD-10-CM | POA: Diagnosis not present

## 2021-12-18 DIAGNOSIS — G8929 Other chronic pain: Secondary | ICD-10-CM | POA: Diagnosis present

## 2021-12-18 DIAGNOSIS — R001 Bradycardia, unspecified: Secondary | ICD-10-CM | POA: Diagnosis present

## 2021-12-18 DIAGNOSIS — R000 Tachycardia, unspecified: Secondary | ICD-10-CM | POA: Diagnosis present

## 2021-12-18 DIAGNOSIS — I723 Aneurysm of iliac artery: Secondary | ICD-10-CM

## 2021-12-18 DIAGNOSIS — K921 Melena: Secondary | ICD-10-CM | POA: Diagnosis not present

## 2021-12-18 DIAGNOSIS — D509 Iron deficiency anemia, unspecified: Secondary | ICD-10-CM

## 2021-12-18 DIAGNOSIS — I2510 Atherosclerotic heart disease of native coronary artery without angina pectoris: Secondary | ICD-10-CM | POA: Diagnosis present

## 2021-12-18 DIAGNOSIS — K2270 Barrett's esophagus without dysplasia: Secondary | ICD-10-CM | POA: Diagnosis present

## 2021-12-18 DIAGNOSIS — D696 Thrombocytopenia, unspecified: Secondary | ICD-10-CM | POA: Diagnosis present

## 2021-12-18 DIAGNOSIS — I714 Abdominal aortic aneurysm, without rupture, unspecified: Secondary | ICD-10-CM

## 2021-12-18 DIAGNOSIS — I7772 Dissection of iliac artery: Secondary | ICD-10-CM | POA: Diagnosis present

## 2021-12-18 DIAGNOSIS — K259 Gastric ulcer, unspecified as acute or chronic, without hemorrhage or perforation: Secondary | ICD-10-CM | POA: Diagnosis not present

## 2021-12-18 DIAGNOSIS — J439 Emphysema, unspecified: Secondary | ICD-10-CM | POA: Diagnosis present

## 2021-12-18 DIAGNOSIS — I700 Atherosclerosis of aorta: Secondary | ICD-10-CM | POA: Diagnosis present

## 2021-12-18 DIAGNOSIS — E876 Hypokalemia: Secondary | ICD-10-CM | POA: Diagnosis present

## 2021-12-18 DIAGNOSIS — K449 Diaphragmatic hernia without obstruction or gangrene: Secondary | ICD-10-CM | POA: Diagnosis present

## 2021-12-18 DIAGNOSIS — Z79899 Other long term (current) drug therapy: Secondary | ICD-10-CM | POA: Diagnosis not present

## 2021-12-18 DIAGNOSIS — D62 Acute posthemorrhagic anemia: Secondary | ICD-10-CM | POA: Diagnosis present

## 2021-12-18 DIAGNOSIS — K922 Gastrointestinal hemorrhage, unspecified: Secondary | ICD-10-CM | POA: Diagnosis present

## 2021-12-18 DIAGNOSIS — I441 Atrioventricular block, second degree: Secondary | ICD-10-CM | POA: Diagnosis not present

## 2021-12-18 DIAGNOSIS — R54 Age-related physical debility: Secondary | ICD-10-CM | POA: Diagnosis present

## 2021-12-18 DIAGNOSIS — I110 Hypertensive heart disease with heart failure: Secondary | ICD-10-CM | POA: Diagnosis present

## 2021-12-18 DIAGNOSIS — F17210 Nicotine dependence, cigarettes, uncomplicated: Secondary | ICD-10-CM | POA: Diagnosis present

## 2021-12-18 DIAGNOSIS — D500 Iron deficiency anemia secondary to blood loss (chronic): Secondary | ICD-10-CM | POA: Diagnosis not present

## 2021-12-18 DIAGNOSIS — I255 Ischemic cardiomyopathy: Secondary | ICD-10-CM | POA: Diagnosis present

## 2021-12-18 DIAGNOSIS — D649 Anemia, unspecified: Secondary | ICD-10-CM | POA: Diagnosis not present

## 2021-12-18 DIAGNOSIS — R911 Solitary pulmonary nodule: Secondary | ICD-10-CM | POA: Diagnosis not present

## 2021-12-18 DIAGNOSIS — I21A1 Myocardial infarction type 2: Secondary | ICD-10-CM | POA: Diagnosis present

## 2021-12-18 DIAGNOSIS — I5020 Unspecified systolic (congestive) heart failure: Secondary | ICD-10-CM | POA: Diagnosis present

## 2021-12-18 DIAGNOSIS — K2289 Other specified disease of esophagus: Secondary | ICD-10-CM | POA: Diagnosis not present

## 2021-12-18 DIAGNOSIS — K253 Acute gastric ulcer without hemorrhage or perforation: Secondary | ICD-10-CM | POA: Diagnosis not present

## 2021-12-18 DIAGNOSIS — R600 Localized edema: Secondary | ICD-10-CM | POA: Diagnosis not present

## 2021-12-18 DIAGNOSIS — I10 Essential (primary) hypertension: Secondary | ICD-10-CM

## 2021-12-18 DIAGNOSIS — K4090 Unilateral inguinal hernia, without obstruction or gangrene, not specified as recurrent: Secondary | ICD-10-CM | POA: Diagnosis present

## 2021-12-18 DIAGNOSIS — I5021 Acute systolic (congestive) heart failure: Secondary | ICD-10-CM | POA: Diagnosis not present

## 2021-12-18 DIAGNOSIS — R778 Other specified abnormalities of plasma proteins: Secondary | ICD-10-CM | POA: Diagnosis not present

## 2021-12-18 DIAGNOSIS — K5900 Constipation, unspecified: Secondary | ICD-10-CM | POA: Diagnosis present

## 2021-12-18 DIAGNOSIS — K3189 Other diseases of stomach and duodenum: Secondary | ICD-10-CM | POA: Diagnosis not present

## 2021-12-18 DIAGNOSIS — K254 Chronic or unspecified gastric ulcer with hemorrhage: Secondary | ICD-10-CM | POA: Diagnosis present

## 2021-12-18 LAB — CBC WITH DIFFERENTIAL/PLATELET
Abs Immature Granulocytes: 0.01 10*3/uL (ref 0.00–0.07)
Basophils Absolute: 0 10*3/uL (ref 0.0–0.1)
Basophils Absolute: 0 10*3/uL (ref 0.0–0.1)
Basophils Relative: 0 % (ref 0.0–3.0)
Basophils Relative: 1 %
Eosinophils Absolute: 0.1 10*3/uL (ref 0.0–0.7)
Eosinophils Absolute: 0 10*3/uL (ref 0.0–0.5)
Eosinophils Relative: 2 % (ref 0.0–5.0)
Eosinophils Relative: 1 %
HCT: 20.5 % — CL (ref 39.0–52.0)
HCT: 19.8 % — ABNORMAL LOW (ref 39.0–52.0)
Hemoglobin: 6.1 g/dL — CL (ref 13.0–17.0)
Hemoglobin: 5.7 g/dL — CL (ref 13.0–17.0)
Immature Granulocytes: 0 %
Lymphocytes Relative: 21 % (ref 12.0–46.0)
Lymphocytes Relative: 23 %
Lymphs Abs: 1.4 10*3/uL (ref 0.7–4.0)
Lymphs Abs: 1.1 10*3/uL (ref 0.7–4.0)
MCH: 24.1 pg — ABNORMAL LOW (ref 26.0–34.0)
MCHC: 29.9 g/dL — ABNORMAL LOW (ref 30.0–36.0)
MCHC: 28.8 g/dL — ABNORMAL LOW (ref 30.0–36.0)
MCV: 79 fl (ref 78.0–100.0)
MCV: 83.5 fL (ref 80.0–100.0)
Monocytes Absolute: 0.3 10*3/uL (ref 0.1–1.0)
Monocytes Absolute: 0.5 10*3/uL (ref 0.1–1.0)
Monocytes Relative: 4 % (ref 3.0–12.0)
Monocytes Relative: 10 %
Neutro Abs: 2.8 10*3/uL (ref 1.4–7.7)
Neutro Abs: 2.9 10*3/uL (ref 1.7–7.7)
Neutrophils Relative %: 73 % (ref 43.0–77.0)
Neutrophils Relative %: 65 %
Platelets: 104 10*3/uL — ABNORMAL LOW (ref 150.0–400.0)
Platelets: 112 10*3/uL — ABNORMAL LOW (ref 150–400)
RBC: 2.6 Mil/uL — ABNORMAL LOW (ref 4.22–5.81)
RBC: 2.37 MIL/uL — ABNORMAL LOW (ref 4.22–5.81)
RDW: 24.4 % — ABNORMAL HIGH (ref 11.5–15.5)
RDW: 22.7 % — ABNORMAL HIGH (ref 11.5–15.5)
WBC: 4.6 10*3/uL (ref 4.0–10.5)
WBC: 4.5 10*3/uL (ref 4.0–10.5)
nRBC: 0 % (ref 0.0–0.2)

## 2021-12-18 LAB — URINALYSIS, ROUTINE W REFLEX MICROSCOPIC
Bilirubin Urine: NEGATIVE
Glucose, UA: NEGATIVE mg/dL
Hgb urine dipstick: NEGATIVE
Ketones, ur: NEGATIVE mg/dL
Leukocytes,Ua: NEGATIVE
Nitrite: NEGATIVE
Protein, ur: NEGATIVE mg/dL
Specific Gravity, Urine: 1.014 (ref 1.005–1.030)
pH: 6 (ref 5.0–8.0)

## 2021-12-18 LAB — COMPREHENSIVE METABOLIC PANEL
ALT: 15 U/L (ref 0–44)
AST: 21 U/L (ref 15–41)
Albumin: 4.1 g/dL (ref 3.5–5.0)
Alkaline Phosphatase: 42 U/L (ref 38–126)
Anion gap: 10 (ref 5–15)
BUN: 15 mg/dL (ref 8–23)
CO2: 24 mmol/L (ref 22–32)
Calcium: 9.3 mg/dL (ref 8.9–10.3)
Chloride: 107 mmol/L (ref 98–111)
Creatinine, Ser: 0.84 mg/dL (ref 0.61–1.24)
GFR, Estimated: >60 mL/min (ref >60)
Glucose, Bld: 93 mg/dL (ref 70–99)
Potassium: 3.2 mmol/L — ABNORMAL LOW (ref 3.5–5.1)
Sodium: 141 mmol/L (ref 135–145)
Total Bilirubin: 0.4 mg/dL (ref 0.3–1.2)
Total Protein: 7.4 g/dL (ref 6.5–8.1)

## 2021-12-18 LAB — TROPONIN I (HIGH SENSITIVITY)
Troponin I (High Sensitivity): 112 ng/L (ref <18)
Troponin I (High Sensitivity): 147 ng/L (ref <18)

## 2021-12-18 LAB — POC OCCULT BLOOD, ED: Fecal Occult Bld: POSITIVE — AB

## 2021-12-18 LAB — IBC + FERRITIN
Ferritin: 6.7 ng/mL — ABNORMAL LOW (ref 22.0–322.0)
Iron: 14 ug/dL — ABNORMAL LOW (ref 42–165)
Saturation Ratios: 2.4 % — ABNORMAL LOW (ref 20.0–50.0)
TIBC: 575.4 ug/dL — ABNORMAL HIGH (ref 250.0–450.0)
Transferrin: 411 mg/dL — ABNORMAL HIGH (ref 212.0–360.0)

## 2021-12-18 LAB — PREPARE RBC (CROSSMATCH)

## 2021-12-18 LAB — ABO/RH: ABO/RH(D): B POS

## 2021-12-18 MED ORDER — PANTOPRAZOLE 80MG IVPB - SIMPLE MED
80.0000 mg | Freq: Once | INTRAVENOUS | Status: AC
  Administered 2021-12-18: 80 mg via INTRAVENOUS
  Filled 2021-12-18: qty 80

## 2021-12-18 MED ORDER — SODIUM CHLORIDE 0.9 % IV SOLN: 10.0000 mL/h | Freq: Once | INTRAVENOUS | Status: DC

## 2021-12-18 MED ORDER — IOHEXOL 350 MG/ML SOLN
100.0000 mL | Freq: Once | INTRAVENOUS | Status: AC | PRN
Start: 2021-12-18 — End: 2021-12-18
  Administered 2021-12-18: 100 mL via INTRAVENOUS

## 2021-12-18 MED ORDER — POTASSIUM CHLORIDE CRYS ER 20 MEQ PO TBCR
20.0000 meq | EXTENDED_RELEASE_TABLET | Freq: Once | ORAL | Status: AC
  Administered 2021-12-19: 20 meq via ORAL
  Filled 2021-12-18: qty 1

## 2021-12-18 MED ORDER — PANTOPRAZOLE INFUSION (NEW) - SIMPLE MED
8.0000 mg/h | INTRAVENOUS | Status: DC
  Administered 2021-12-18 – 2021-12-21 (×6): 8 mg/h via INTRAVENOUS
  Filled 2021-12-18 (×2): qty 80
  Filled 2021-12-18 (×7): qty 100
  Filled 2021-12-18: qty 80

## 2021-12-18 MED ORDER — SODIUM CHLORIDE 0.9% FLUSH
3.0000 mL | Freq: Two times a day (BID) | INTRAVENOUS | Status: DC
  Administered 2021-12-20 – 2021-12-24 (×7): 3 mL via INTRAVENOUS

## 2021-12-18 MED ORDER — ACETAMINOPHEN 650 MG RE SUPP: 650.0000 mg | Freq: Four times a day (QID) | RECTAL | Status: DC | PRN

## 2021-12-18 MED ORDER — PANTOPRAZOLE SODIUM 40 MG IV SOLR: 40.0000 mg | Freq: Two times a day (BID) | INTRAVENOUS | Status: DC

## 2021-12-18 MED ORDER — POTASSIUM CHLORIDE IN NACL 20-0.9 MEQ/L-% IV SOLN
INTRAVENOUS | Status: AC
  Filled 2021-12-18: qty 1000

## 2021-12-18 MED ORDER — POLYETHYLENE GLYCOL 3350 17 GM/SCOOP PO POWD: 17.0000 g | Freq: Every day | ORAL | 0 refills | Status: DC

## 2021-12-18 MED ORDER — ACETAMINOPHEN 325 MG PO TABS
650.0000 mg | ORAL_TABLET | Freq: Four times a day (QID) | ORAL | Status: DC | PRN
  Administered 2021-12-22: 650 mg via ORAL
  Filled 2021-12-18: qty 2

## 2021-12-18 NOTE — Telephone Encounter (Signed)
-----   Message from Loralie Champagne, PA-C sent at 12/18/2021 12:32 PM EST ----- I saw this patient in clinic today.  Hemoglobin 2 weeks ago was 7.1 grams.  Scheduled him for an EGD tomorrow for iron deficiency anemia and he refused colonoscopy.  Hemoglobin today is 6.1 g (got called with a critical).  Need to try to convince him to go to the ER to be transfused and possibly admitted and our team can do the EGD while in house.  Thank you,  Jess

## 2021-12-18 NOTE — Patient Instructions (Signed)
Start Miralax 1 capful daily in 8 ounces of liquid.   Your provider has requested that you go to the basement level for lab work before leaving today. Press "B" on the elevator. The lab is located at the first door on the left as you exit the elevator.  You have been scheduled for an endoscopy. Please follow written instructions given to you at your visit today. If you use inhalers (even only as needed), please bring them with you on the day of your procedure.  _______________________________________________________  If you are age 61 or older, your body mass index should be between 23-30. Your Body mass index is 20.67 kg/m. If this is out of the aforementioned range listed, please consider follow up with your Primary Care Provider.  If you are age 45 or younger, your body mass index should be between 19-25. Your Body mass index is 20.67 kg/m. If this is out of the aformentioned range listed, please consider follow up with your Primary Care Provider.   ________________________________________________________  The [facility] providers would like to encourage you to use [facility] to communicate with providers for non-urgent requests or questions.  Due to long hold times on the telephone, sending your provider a message by [facility] may be a faster and more efficient way to get a response.  Please allow 48 business hours for a response.  Please remember that this is for non-urgent requests.  _______________________________________________________

## 2021-12-18 NOTE — ED Triage Notes (Signed)
Per PA, pt has a hemoglobin of 6 and was sent for a blood transfusion

## 2021-12-18 NOTE — ED Provider Triage Note (Signed)
Emergency Medicine Provider Triage Evaluation Note  Greg [name] , a 71 y.o. male  was evaluated in triage.  Pt complains of  "hair in his throat" for "a while". He was sent over from his gastro office for blood transfusion given his hemoglobin was 6.1 and was endoscopy while admitted. Reports dark stool for the past 6 weeks.  Review of Systems  Positive:  Negative:   Physical Exam  BP 111/85   Pulse (!) 114   Temp 98.1 F (36.7 C) (Oral)   Resp 18   Ht '5\' 8"'$  (1.727 m)   Wt 59.9 kg   SpO2 100%   BMI 20.07 kg/m  Gen:   Awake, no distress   Resp:  Normal effort  MSK:   Moves extremities without difficulty  Other:  Appears to be blood on his pants near his zipper, but he denies it is blood.   Medical Decision Making  Medically screening exam initiated at 5:24 PM.  Appropriate orders placed.  Greg [name] was informed that the remainder of the evaluation will be completed by another provider, this initial triage assessment does not replace that evaluation, and the importance of remaining in the ED until their evaluation is complete.  Type and screen ordered. Risks and benefits of blood transfusion discussed and patient is will to accept risks and receive blood.    Sherrell Puller, PA-C 12/18/21 1726

## 2021-12-18 NOTE — Telephone Encounter (Signed)
The pt is on the way to [facility] ED now.  FYI

## 2021-12-18 NOTE — Telephone Encounter (Signed)
Patient's sister called back questioning patient appt scheduled for tomorrow. She was advised not to worry about the appt and to urgently take the patient to the hospital. She stated the only way to get him there was by ambulance, was advised to please call the ambulance to get him transported.

## 2021-12-18 NOTE — H&P (Signed)
History and Physical    Greg [name] NLZ:[id number] DOB: Nov 19, 1950 DOA: 12/18/2021  PCP: Pcp, No   Patient coming from: Home   Chief Complaint: Low hemoglobin   HPI: Greg [name] is a 71 y.o. male with medical history significant for iron deficiency anemia, AAA, and hypertension who presents to the emergency department for evaluation of low hemoglobin.  Patient was referred to GI for iron deficiency anemia, had blood work performed revealing hemoglobin of 6.1, and he was directed to the ED.  He denies any abdominal pain, denies vomiting, and denies seeing any red blood in his stool but has had some intermittently dark stool.  He denies chest pain or shortness of breath.  ED Course: Upon arrival to the ED, patient is found to be afebrile and saturating well on room air with stable blood pressure.  Chest x-ray is negative for acute pulmonary disease.  Globin is 5.7 and platelets 112,000.  Fecal occult blood testing is positive.  Troponin was elevated to 112 and then 147.  Potassium was 3.2.  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  ED discussed the case with vascular surgery who reviewed the images and recommends outpatient evaluation for the vascular findings.  IV Protonix was started in the ED, 2 units of RBC were ordered for transfusion, and ED physician sent a message to GI with request for a.m. consult.  Review of Systems:  All other systems reviewed and apart from HPI, are negative.  History reviewed. No pertinent past medical history.  History reviewed. No pertinent surgical history.  Social History:   reports that he has been smoking cigarettes. He has been smoking an average of 1 pack per day. He has never used smokeless tobacco. He reports  that he does not currently use alcohol. He reports that he does not use drugs.  No Known Allergies  Family History  Problem Relation Age of Onset   Stomach cancer Neg Hx    Colon cancer Neg Hx    Esophageal cancer Neg Hx    Pancreatic cancer Neg Hx      Prior to Admission medications   Medication Sig Start Date End Date Taking? Authorizing Provider  lisinopril (ZESTRIL) 5 MG tablet Take 5 mg by mouth daily.   Yes [provider]  OVER THE COUNTER MEDICATION Over the counter pain relief   Yes [provider]  guaiFENesin (TUSSIN PO) Take 10 mLs by mouth in the morning and at bedtime. Patient not taking: Reported on 12/18/2021    [provider]  mupirocin ointment (BACTROBAN) 2 % Apply topically daily. To ulcer Patient not taking: Reported on 12/18/2021 09/10/20   Gareth Morgan, MD  polyethylene glycol powder (GLYCOLAX/MIRALAX) 17 GM/SCOOP powder Dissolve one capful (17g) in water and drink daily Patient not taking: Reported on 12/18/2021 12/18/21   Zehr, Laban Emperor, PA-C  Pyridoxine HCl (VITAMIN B6 PO) Take 1 tablet by mouth daily. Patient not taking: Reported on 12/18/2021    [provider]    Physical Exam: Vitals:   12/18/21 2124 12/18/21 2145 12/18/21 2200 12/18/21 2215  BP: (!) 147/87 (!) 121/109 (!) 147/93 (!) 140/86  Pulse: 97 (!) 124 91 97  Resp: 19  19   Temp: 98.3 F (36.8 C) 98.5 F (36.9 C)  TempSrc: Oral Oral    SpO2: 100% 99% 100% 98%  Weight:      Height:        Constitutional: NAD, no pallor or diaphoresis   Eyes: PERTLA, lids and conjunctivae normal ENMT: Mucous membranes are moist. Posterior pharynx clear of any exudate or lesions.   Neck: supple, no masses  Respiratory: no wheezing, no crackles. No accessory muscle use.  Cardiovascular: S1 & S2 heard, regular rate and rhythm. B/l lower leg swelling. Abdomen: No distension, soft. Bowel sounds active.  Musculoskeletal: no clubbing / cyanosis. No joint deformity upper  and lower extremities.   Skin: xerosis, no wounds noted. Warm, dry, well-perfused. Neurologic: CN 2-12 grossly intact. Moving all extremities. Alert and oriented.  Psychiatric: Calm. Cooperative.    Labs and Imaging on Admission: I have personally reviewed following labs and imaging studies  CBC: Recent Labs  Lab 12/18/21 1125 12/18/21 1920  WBC 4.6 4.5  NEUTROABS 2.8 R 2.9  HGB 6.1 Repeated and verified X2.* 5.7*  HCT 20.5 Repeated and verified X2.* 19.8*  MCV 79.0 83.5  PLT 104.0* 094*   Basic Metabolic Panel: Recent Labs  Lab 12/18/21 1920  NA 141  K 3.2*  CL 107  CO2 24  GLUCOSE 93  BUN 15  CREATININE 0.84  CALCIUM 9.3   GFR: Estimated Creatinine Clearance: 69.3 mL/min (by C-G formula based on SCr of 0.84 mg/dL). Liver Function Tests: Recent Labs  Lab 12/18/21 1920  AST 21  ALT 15  ALKPHOS 42  BILITOT 0.4  PROT 7.4  ALBUMIN 4.1   No results for input(s): "LIPASE", "AMYLASE" in the last 168 hours. No results for input(s): "AMMONIA" in the last 168 hours. Coagulation Profile: No results for input(s): "INR", "PROTIME" in the last 168 hours. Cardiac Enzymes: No results for input(s): "CKTOTAL", "CKMB", "CKMBINDEX", "TROPONINI" in the last 168 hours. BNP (last 3 results) No results for input(s): "PROBNP" in the last 8760 hours. HbA1C: No results for input(s): "HGBA1C" in the last 72 hours. CBG: No results for input(s): "GLUCAP" in the last 168 hours. Lipid Profile: No results for input(s): "CHOL", "HDL", "LDLCALC", "TRIG", "CHOLHDL", "LDLDIRECT" in the last 72 hours. Thyroid Function Tests: No results for input(s): "TSH", "T4TOTAL", "FREET4", "T3FREE", "THYROIDAB" in the last 72 hours. Anemia Panel: Recent Labs    12/18/21 1125  FERRITIN 6.7*  TIBC 575.4*  IRON 14*   Urine analysis:    Component Value Date/Time   COLORURINE STRAW (A) 12/18/2021 1945   APPEARANCEUR CLEAR 12/18/2021 1945   LABSPEC 1.014 12/18/2021 1945   PHURINE 6.0 12/18/2021  1945   GLUCOSEU NEGATIVE 12/18/2021 1945   HGBUR NEGATIVE 12/18/2021 1945   BILIRUBINUR NEGATIVE 12/18/2021 1945   KETONESUR NEGATIVE 12/18/2021 1945   PROTEINUR NEGATIVE 12/18/2021 1945   NITRITE NEGATIVE 12/18/2021 1945   LEUKOCYTESUR NEGATIVE 12/18/2021 1945   Sepsis Labs: '@LABRCNTIP'$ (procalcitonin:4,lacticidven:4) )No results found for this or any previous visit (from the past 240 hour(s)).   Radiological Exams on Admission: CT Angio Chest/Abd/Pel for Dissection W and/or Wo Contrast  Result Date: 12/18/2021 CLINICAL DATA:  Acute aortic syndrome suspected. Shortness of breath, known abdominal aortic aneurysm. Concern for dissection. EXAM: CT ANGIOGRAPHY CHEST, ABDOMEN AND PELVIS TECHNIQUE: Non-contrast CT of the chest was initially obtained. Multidetector CT imaging through the chest, abdomen and pelvis was performed using the standard protocol during bolus administration of intravenous contrast. Multiplanar reconstructed images and MIPs were obtained and reviewed to evaluate the vascular anatomy. RADIATION DOSE REDUCTION: This exam was performed according to  the departmental dose-optimization program which includes automated exposure control, adjustment of the mA and/or kV according to patient size and/or use of iterative reconstruction technique. CONTRAST:  167m OMNIPAQUE IOHEXOL 350 MG/ML SOLN COMPARISON:  09/10/2020. FINDINGS: CTA CHEST FINDINGS Cardiovascular: The heart is normal in size and there is no pericardial effusion. Multi-vessel coronary artery calcifications are seen. There is atherosclerotic calcification of the aorta without evidence of aneurysm or dissection. The pulmonary trunk is normal in size. Mediastinum/Nodes: No mediastinal lymphadenopathy. A nonspecific prominent lymph node is present at the right hilum measuring 1 cm. No axillary lymphadenopathy. The thyroid gland, trachea, and esophagus are within normal limits. Lungs/Pleura: Advanced paraseptal and centrilobular  emphysematous changes are present in the lungs. Apical pleural scarring is present bilaterally. There is a 4 mm nodule in the right middle lobe, axial image 123. No consolidation, effusion, or pneumothorax. Musculoskeletal: Degenerative changes are present in the thoracic spine. Mild compression deformities are present in the mid thoracic spine which are indeterminate in age. Review of the MIP images confirms the above findings. CTA ABDOMEN AND PELVIS FINDINGS VASCULAR Aorta: Atherosclerotic calcification and mural thrombus of the abdominal aorta with aneurysmal dilatation of the distal abdominal aorta measuring 3.3 x 3.4 cm. The aorta has a tortuous course. No dissection is seen. Celiac: There is aneurysmal dilatation of the mid celiac artery with a small dissection flap. The celiac artery and splenic artery are patent. The hepatic artery originates from the superior mesenteric artery which is a normal variant SMA: Patent without evidence of aneurysm, dissection, vasculitis or significant stenosis. Renals: Both renal arteries are patent without evidence of aneurysm, dissection, vasculitis, fibromuscular dysplasia or significant stenosis. IMA: The visualized portion of the IMA is patent. 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 There is atherosclerotic calcification with mural thrombus with dilatation of the common iliac artery on the left measuring 1.8 cm. No dissection flap is seen. There is atherosclerotic calcification of the external and internal iliac arteries bilaterally with a mural thrombus with no significant stenosis, dissection, or aneurysm. Veins: No obvious venous abnormality within the limitations of this arterial phase study. Review of the MIP images confirms the above findings. NON-VASCULAR Hepatobiliary: Transient hepatic arterial differences are noted. No  biliary ductal dilatation. The gallbladder is without stones. Pancreas: Unremarkable. No pancreatic ductal dilatation or surrounding inflammatory changes. Spleen: Normal in size without focal abnormality. Adrenals/Urinary Tract: The adrenal glands are within normal limits. The kidneys enhance symmetrically. No renal calculus or hydronephrosis. Bladder is unremarkable. Stomach/Bowel: There is mild thickening of the walls of the gastric antrum. No bowel obstruction, free air, or pneumatosis. There is a large right inguinal hernia with multiple loops of small and large bowel and mesentery extending into the scrotal sac on the right. The appendix is not visualized on exam. Lymphatic: No abdominal or pelvic lymphadenopathy. Reproductive: The prostate gland is enlarged. Other: No abdominopelvic ascites. Musculoskeletal: Mild degenerative changes in the lumbar spine. No acute osseous abnormality. Review of the MIP images confirms the above findings. IMPRESSION: 1. Aortic atherosclerosis with aneurysmal dilatation of the distal abdominal aorta measuring 3.3 x 3.4 cm. No dissection is seen. Recommend follow-up every 3 years. 2. 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 A vascular surgery consultation is recommended. 3. Aneurysmal dilatation of the common iliac  artery on the left measuring 1.8 cm. 4. 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 5. Advanced emphysema. 6. 4 mm right middle lobe nodule. No follow-up needed if patient is low-risk.This recommendation follows the consensus statement: Guidelines for Management of Incidental Pulmonary Nodules Detected on CT Images: From the Fleischner Society 2017; Radiology 2017; 284:228-243. 7. Enlarged right inguinal hernia extending into the scrotal sac on the right containing nonobstructed small and large bowel and a portion of the mesentery.  Electronically Signed   By: Brett Fairy M.D.   On: 12/18/2021 21:41   DG Chest 2 View  Result Date: 12/18/2021 CLINICAL DATA:  Short of breath EXAM: CHEST - 2 VIEW COMPARISON:  Chest 08/08/2020 FINDINGS: The heart size and mediastinal contours are within normal limits. Both lungs are clear. The visualized skeletal structures are unremarkable. IMPRESSION: No active cardiopulmonary disease. Electronically Signed   By: Franchot Gallo M.D.   On: 12/18/2021 18:15      Assessment/Plan   1. GI bleeding; acute blood-loss anemia  - Continue bowel rest and IV PPI, check post-transfusion CBC, follow-up on GI recommendations    2. AAA; b/l common iliac artery aneurysm  - Per ED physician, vascular surgery reviewed images and recommends outpatient follow-up   3. Elevated troponin  - Troponin is elevated in ED without chest pain  - Likely type II non-STEMI in setting of severe anemia  - Transfuse RBC as above, check EKG, trend troponin, check echo for regional wall-motion abnormality   4. Thrombocytopenia  - Platelets 112,000 on admission  - No apparent infection; pt denies EtOH use; LFTs normal and no mention of schistocytes on smear review - Trend platelet count, check HIV, hep C, B12, folate, and copper    5. Hypokalemia  - Replacing   6. Lung nodule - Discussed with patient, outpatient follow-up recommended    DVT prophylaxis: SCDs  Code Status: Full  Level of Care: Level of care: Telemetry Family Communication: none present  Disposition Plan:  Patient is from: home  Anticipated d/c is to: TBD Anticipated d/c date is: 12/21/21  Patient currently: Pending EKG, echocardiogram, GI consultation, stable H&H  Consults called: GI  Admission status: Inpatient     Vianne Bulls, MD [facility]  12/18/2021, 11:29 PM

## 2021-12-18 NOTE — Progress Notes (Signed)
12/18/2021 Jeremaih Klima [id number] 1950/10/13   HISTORY OF PRESENT ILLNESS: This is a 71 year old male who is new to our office.  Has been referred here by his PCP, Dr. Coletta Memos, for evaluation regarding iron deficiency anemia.  Hemoglobin about 2 weeks ago was 7.1 g.  Iron studies low.  The last that I have for comparison was over a year ago in August 2022 at which time his hemoglobin was in the 12 g range.  He is a poor historian and does not give great detail, but denies seeing red blood in his stool.  He admits to intermittent dark stools.  Take some type of "pain reliever" regularly, but is not exactly sure what this is.  He has never had an EGD or colonoscopy and is refusing colonoscopy, but tells me he will agree to have the upper endoscopy performed.  He tells me that he has lost about 15 pounds over time, but looks like he is lost maybe 5 pounds since June 2022.  He says that he feels nauseous like he could vomit every day but denies acid reflux.  Has constipation, but does not use anything to help him go.  He actually thought he was here today for a chest x-ray for lung screening.  He denies dysphagia.  Looks like MiraLAX was prescribed for him back in June 2022, but he says that he never received or used that.  History reviewed. No pertinent past medical history. History reviewed. No pertinent surgical history.  reports that he has been smoking cigarettes. He has been smoking an average of 1 pack per day. He has never used smokeless tobacco. He reports that he does not currently use alcohol. He reports that he does not use drugs. family history is not on file. No Known Allergies    Outpatient Encounter Medications as of 12/18/2021  Medication Sig   OVER THE COUNTER MEDICATION Over the counter pain relief   guaiFENesin (TUSSIN PO) Take 10 mLs by mouth in the morning and at bedtime. (Patient not taking: Reported on 12/18/2021)   mupirocin ointment (BACTROBAN) 2 % Apply topically daily.  To ulcer (Patient not taking: Reported on 12/18/2021)   polyethylene glycol powder (GLYCOLAX/MIRALAX) 17 GM/SCOOP powder Dissolve one capful (17g) in water and drink daily (Patient not taking: Reported on 12/18/2021)   Pyridoxine HCl (VITAMIN B6 PO) Take 1 tablet by mouth daily. (Patient not taking: Reported on 12/18/2021)   No facility-administered encounter medications on file as of 12/18/2021.     REVIEW OF SYSTEMS  : All other systems reviewed and negative except where noted in the History of Present Illness.   PHYSICAL EXAM: Ht '5\' 7"'$  (1.702 m)   Wt 132 lb (59.9 kg)   BMI 20.67 kg/m  General: Well developed AA male in no acute distress Head: Normocephalic and atraumatic Eyes:  Sclerae anicteric, conjunctiva pink. Ears: Normal auditory acuity Lungs: Clear throughout to auscultation; no W/R/R. Heart: Regular rate and rhythm; no M/R/G. Abdomen: Soft, non-distended.  BS present.  Non-tender. Musculoskeletal: Symmetrical with no gross deformities  Skin: No lesions on visible extremities Extremities: No edema  Neurological: Alert oriented x 4, grossly non-focal Psychological:  Alert and cooperative. Normal mood and affect  ASSESSMENT AND PLAN: *Iron deficiency anemia: Hemoglobin about 2 weeks ago was 7.1 g.  Iron studies low.  We will repeat a CBC today and check a ferritin level.  Last that I have for comparison was a just over a year ago, but hemoglobin was in  the 12 g range at that time.  He has never had EGD or colonoscopy.  He denies seeing red blood in stools, but admits to occasional dark stools and takes some type of over-the-counter "pain reliever" on a regular basis.  He is a poor historian.  Agrees to EGD, but refusing colonoscopy.  Colonoscopy is actually scheduled with Dr. Rush Landmark for tomorrow pending results of his hemoglobin. *Chronic constipation: Not using anything to help him move his bowels.  Looks like MiraLAX was prescribed over a year ago.  He agreed to try that if  we send a new prescription.  This was sent to his pharmacy.  **Addendum: Received a critical lab with his hemoglobin of 6.1 g.  Having my nurse contact him to direct him to the emergency department for transfusion and hopefully admission at which time he be seen by our service and receive endoscopic evaluation there.   CC:  Bernerd Limbo, MD

## 2021-12-18 NOTE — ED Triage Notes (Signed)
Pt to er, pt states that he is here because he has a hair in his throat.  Pt states that his pmd also sent him in to the hospital to be evaluated.

## 2021-12-18 NOTE — Telephone Encounter (Signed)
Dr.  Rush Landmark,  This pt was put on your [facility] schedule for tomorrow.  Unfortunately he has significant cardiovascular hx.to include MI and AAA, but no studies in the EMR for [place] to evaluate these problems.  Consequently, his procedure will have to be postponed until further data can be obtained.  Regards,  Osvaldo Angst

## 2021-12-18 NOTE — Telephone Encounter (Signed)
I spoke with the pt's sister and she is going to try and convince the pt to go to the ED now.  She will call back if he refuses.

## 2021-12-18 NOTE — Telephone Encounter (Signed)
Agree that due to his underlying progressive anemia, evaluation in the inpatient hospital setting makes sense.   GM

## 2021-12-18 NOTE — ED Provider Notes (Signed)
[facility] DEPT Provider Note   CSN: [id number] Arrival date & time: 12/18/21  1635     History  Chief Complaint  Patient presents with   Shortness of Breath    Greg [name] is a 71 y.o. male history of iron deficiency anemia here presenting with anemia.  Patient has been having melena for about a week now.  Patient went to GI clinic earlier today and had a hemoglobin of 6.1.  Patient states that he is feeling short of breath.  He also has a history of AAA and GI sent in for transfusion and rule out AAA.  Patient states that he has no abdominal pain.  The history is provided by the patient.       Home Medications Prior to Admission medications   Medication Sig Start Date End Date Taking? Authorizing Provider  guaiFENesin (TUSSIN PO) Take 10 mLs by mouth in the morning and at bedtime. Patient not taking: Reported on 12/18/2021    [provider]  lisinopril (ZESTRIL) 5 MG tablet Take 5 mg by mouth daily.    [provider]  mupirocin ointment (BACTROBAN) 2 % Apply topically daily. To ulcer Patient not taking: Reported on 12/18/2021 09/10/20   Gareth Morgan, MD  OVER THE COUNTER MEDICATION Over the counter pain relief    [provider]  polyethylene glycol powder (GLYCOLAX/MIRALAX) 17 GM/SCOOP powder Dissolve one capful (17g) in water and drink daily 12/18/21   Zehr, Janett Billow D, PA-C  Pyridoxine HCl (VITAMIN B6 PO) Take 1 tablet by mouth daily. Patient not taking: Reported on 12/18/2021    [provider]      Allergies    Patient has no known allergies.    Review of Systems   Review of Systems  Respiratory:  Positive for shortness of breath.   All other systems reviewed and are negative.   Physical Exam Updated Vital Signs BP 111/85   Pulse (!) 114   Temp 98.1 F (36.7 C) (Oral)   Resp 18   Ht '5\' 8"'$  (1.727 m)   Wt 59.9 kg   SpO2 100%   BMI 20.07 kg/m  Physical Exam Vitals and nursing note  reviewed.  HENT:     Head: Normocephalic.  Eyes:     Extraocular Movements: Extraocular movements intact.     Comments: Conjunctiva is pale  Cardiovascular:     Rate and Rhythm: Normal rate and regular rhythm.  Pulmonary:     Effort: Pulmonary effort is normal.     Breath sounds: Normal breath sounds.  Abdominal:     General: Bowel sounds are normal.     Palpations: Abdomen is soft.     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     Cervical back: Normal range of motion and neck supple.  Skin:    General: Skin is warm.     Capillary Refill: Capillary refill takes less than 2 seconds.  Neurological:     General: No focal deficit present.     Mental Status: He is alert and oriented to person, place, and time.  Psychiatric:        Mood and Affect: Mood normal.        Behavior: Behavior normal.     ED Results / Procedures / Treatments   Labs (all labs ordered are listed, but only abnormal results are displayed) Labs Reviewed  CBC WITH DIFFERENTIAL/PLATELET  COMPREHENSIVE  METABOLIC PANEL  URINALYSIS, ROUTINE W REFLEX MICROSCOPIC  POC OCCULT BLOOD, ED  TYPE AND SCREEN  PREPARE RBC (CROSSMATCH)    EKG None  Radiology DG Chest 2 View  Result Date: 12/18/2021 CLINICAL DATA:  Short of breath EXAM: CHEST - 2 VIEW COMPARISON:  Chest 08/08/2020 FINDINGS: The heart size and mediastinal contours are within normal limits. Both lungs are clear. The visualized skeletal structures are unremarkable. IMPRESSION: No active cardiopulmonary disease. Electronically Signed   By: Franchot Gallo M.D.   On: 12/18/2021 18:15    Procedures Procedures    CRITICAL CARE Performed by: Wandra Arthurs   Total critical care time: 30 minutes  Critical care time was exclusive of separately billable procedures and treating other patients.  Critical care was necessary to treat or prevent imminent or life-threatening  deterioration.  Critical care was time spent personally by me on the following activities: development of treatment plan with patient and/or surrogate as well as nursing, discussions with consultants, evaluation of patient's response to treatment, examination of patient, obtaining history from patient or surrogate, ordering and performing treatments and interventions, ordering and review of laboratory studies, ordering and review of radiographic studies, pulse oximetry and re-evaluation of patient's condition.   Medications Ordered in ED Medications  0.9 %  sodium chloride infusion (has no administration in time range)  pantoprazole (PROTONIX) 80 mg /NS 100 mL IVPB (has no administration in time range)  pantoprozole (PROTONIX) 80 mg /NS 100 mL infusion (has no administration in time range)    ED Course/ Medical Decision Making/ A&P                           Medical Decision Making Greg [name] is a 71 y.o. male here presenting with shortness of breath and melena.  Patient likely has symptomatic anemia from a slow upper GI bleed.  Patient unfortunately has a history of AAA as well.  Per the GI note, they want to rule out AAA and plans to do endoscopy tomorrow.  Plan to repeat a CBC and get a CT dissection study.  We will also transfuse 2 units of blood and start Protonix.  10:00 PM Hg 5.7. Ordered 2 U PRBC. Started on IV protonix. Trop 112. CT showed known aortic aneurysm and enlarging, iliac aneurysm and there is a small dissection flap.  I consulted Dr. Doren Custard from vascular surgery.  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  He does not think that patient needs emergent vascular evaluation and can get follow-up outpatient.  He thinks that the more emergent issue is the GI bleed.  I messaged Dr. Hilarie Fredrickson from GI to see patient in the morning.  Hospitalist to admit.  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 Decision-making details documented in ED Course. Radiology: ordered and independent interpretation performed. Decision-making details documented in ED Course.  Risk Prescription drug management.    Final Clinical Impression(s) / ED Diagnoses Final diagnoses:  None    Rx / DC Orders ED Discharge Orders     None         Drenda Freeze, MD 12/18/21 2207

## 2021-12-19 ENCOUNTER — Inpatient Hospital Stay (HOSPITAL_COMMUNITY): Payer: Medicare Other

## 2021-12-19 ENCOUNTER — Encounter: Payer: Medicare Other | Admitting: Gastroenterology

## 2021-12-19 DIAGNOSIS — D509 Iron deficiency anemia, unspecified: Secondary | ICD-10-CM

## 2021-12-19 DIAGNOSIS — R778 Other specified abnormalities of plasma proteins: Secondary | ICD-10-CM | POA: Diagnosis not present

## 2021-12-19 DIAGNOSIS — D649 Anemia, unspecified: Secondary | ICD-10-CM | POA: Diagnosis not present

## 2021-12-19 DIAGNOSIS — R7989 Other specified abnormal findings of blood chemistry: Secondary | ICD-10-CM | POA: Diagnosis not present

## 2021-12-19 DIAGNOSIS — K921 Melena: Secondary | ICD-10-CM

## 2021-12-19 DIAGNOSIS — I10 Essential (primary) hypertension: Secondary | ICD-10-CM

## 2021-12-19 DIAGNOSIS — D62 Acute posthemorrhagic anemia: Secondary | ICD-10-CM

## 2021-12-19 DIAGNOSIS — I214 Non-ST elevation (NSTEMI) myocardial infarction: Secondary | ICD-10-CM | POA: Diagnosis not present

## 2021-12-19 DIAGNOSIS — R600 Localized edema: Secondary | ICD-10-CM | POA: Diagnosis not present

## 2021-12-19 DIAGNOSIS — E876 Hypokalemia: Secondary | ICD-10-CM | POA: Diagnosis not present

## 2021-12-19 DIAGNOSIS — I714 Abdominal aortic aneurysm, without rupture, unspecified: Secondary | ICD-10-CM | POA: Diagnosis not present

## 2021-12-19 DIAGNOSIS — K922 Gastrointestinal hemorrhage, unspecified: Secondary | ICD-10-CM | POA: Diagnosis not present

## 2021-12-19 LAB — BASIC METABOLIC PANEL
Anion gap: 8 (ref 5–15)
BUN: 12 mg/dL (ref 8–23)
CO2: 24 mmol/L (ref 22–32)
Calcium: 8.9 mg/dL (ref 8.9–10.3)
Chloride: 107 mmol/L (ref 98–111)
Creatinine, Ser: 0.68 mg/dL (ref 0.61–1.24)
GFR, Estimated: >60 mL/min (ref >60)
Glucose, Bld: 95 mg/dL (ref 70–99)
Potassium: 3.3 mmol/L — ABNORMAL LOW (ref 3.5–5.1)
Sodium: 139 mmol/L (ref 135–145)

## 2021-12-19 LAB — CBC
HCT: 25.4 % — ABNORMAL LOW (ref 39.0–52.0)
HCT: 35.2 % — ABNORMAL LOW (ref 39.0–52.0)
Hemoglobin: 7.7 g/dL — ABNORMAL LOW (ref 13.0–17.0)
Hemoglobin: 11.2 g/dL — ABNORMAL LOW (ref 13.0–17.0)
MCH: 25.7 pg — ABNORMAL LOW (ref 26.0–34.0)
MCH: 26.5 pg (ref 26.0–34.0)
MCHC: 30.3 g/dL (ref 30.0–36.0)
MCHC: 31.8 g/dL (ref 30.0–36.0)
MCV: 84.7 fL (ref 80.0–100.0)
MCV: 83.4 fL (ref 80.0–100.0)
Platelets: 95 10*3/uL — ABNORMAL LOW (ref 150–400)
Platelets: 100 10*3/uL — ABNORMAL LOW (ref 150–400)
RBC: 3 MIL/uL — ABNORMAL LOW (ref 4.22–5.81)
RBC: 4.22 MIL/uL (ref 4.22–5.81)
RDW: 18.7 % — ABNORMAL HIGH (ref 11.5–15.5)
RDW: 18.5 % — ABNORMAL HIGH (ref 11.5–15.5)
WBC: 5 10*3/uL (ref 4.0–10.5)
WBC: 5.8 10*3/uL (ref 4.0–10.5)
nRBC: 0.4 % — ABNORMAL HIGH (ref 0.0–0.2)
nRBC: 0.5 % — ABNORMAL HIGH (ref 0.0–0.2)

## 2021-12-19 LAB — PREPARE RBC (CROSSMATCH)

## 2021-12-19 LAB — FOLATE: Folate: 7.2 ng/mL (ref >5.9)

## 2021-12-19 LAB — HEPATITIS PANEL, ACUTE
HCV Ab: NONREACTIVE
Hep A IgM: NONREACTIVE
Hep B C IgM: NONREACTIVE
Hepatitis B Surface Ag: NONREACTIVE

## 2021-12-19 LAB — ECHOCARDIOGRAM COMPLETE
Area-P 1/2: 4.24 cm2
Calc EF: 50.1 %
Height: 68 in
Single Plane A2C EF: 47.7 %
Single Plane A4C EF: 50.5 %
Weight: 2112 oz

## 2021-12-19 LAB — TROPONIN I (HIGH SENSITIVITY): Troponin I (High Sensitivity): 589 ng/L (ref <18)

## 2021-12-19 LAB — VITAMIN B12: Vitamin B-12: 370 pg/mL (ref 180–914)

## 2021-12-19 LAB — HIV ANTIBODY (ROUTINE TESTING W REFLEX): HIV Screen 4th Generation wRfx: NONREACTIVE

## 2021-12-19 LAB — MAGNESIUM: Magnesium: 1.8 mg/dL (ref 1.7–2.4)

## 2021-12-19 MED ORDER — POTASSIUM CHLORIDE CRYS ER 20 MEQ PO TBCR
40.0000 meq | EXTENDED_RELEASE_TABLET | Freq: Once | ORAL | Status: AC
  Administered 2021-12-19: 40 meq via ORAL
  Filled 2021-12-19: qty 2

## 2021-12-19 MED ORDER — FUROSEMIDE 10 MG/ML IJ SOLN
20.0000 mg | Freq: Once | INTRAMUSCULAR | Status: AC
  Administered 2021-12-19: 20 mg via INTRAVENOUS
  Filled 2021-12-19: qty 4

## 2021-12-19 MED ORDER — DIPHENHYDRAMINE HCL 25 MG PO CAPS
25.0000 mg | ORAL_CAPSULE | Freq: Once | ORAL | Status: AC
  Administered 2021-12-19: 25 mg via ORAL
  Filled 2021-12-19: qty 1

## 2021-12-19 MED ORDER — SODIUM CHLORIDE 0.9% IV SOLUTION: Freq: Once | INTRAVENOUS | Status: AC

## 2021-12-19 MED ORDER — SODIUM CHLORIDE 0.9 % IV SOLN: INTRAVENOUS | Status: DC

## 2021-12-19 MED ORDER — LISINOPRIL 10 MG PO TABS
10.0000 mg | ORAL_TABLET | Freq: Every day | ORAL | Status: DC
  Administered 2021-12-19: 10 mg via ORAL
  Filled 2021-12-19: qty 1

## 2021-12-19 MED ORDER — POTASSIUM CHLORIDE CRYS ER 20 MEQ PO TBCR
20.0000 meq | EXTENDED_RELEASE_TABLET | Freq: Once | ORAL | Status: AC
  Administered 2021-12-19: 20 meq via ORAL
  Filled 2021-12-19: qty 1

## 2021-12-19 MED ORDER — ACETAMINOPHEN 325 MG PO TABS
650.0000 mg | ORAL_TABLET | Freq: Once | ORAL | Status: AC
  Administered 2021-12-19: 650 mg via ORAL
  Filled 2021-12-19: qty 2

## 2021-12-19 MED ORDER — MAGNESIUM SULFATE 2 GM/50ML IV SOLN
2.0000 g | Freq: Once | INTRAVENOUS | Status: AC
  Administered 2021-12-19: 2 g via INTRAVENOUS
  Filled 2021-12-19: qty 50

## 2021-12-19 MED ORDER — PERFLUTREN LIPID MICROSPHERE
1.0000 mL | INTRAVENOUS | Status: AC | PRN
  Administered 2021-12-19: 2 mL via INTRAVENOUS

## 2021-12-19 NOTE — Telephone Encounter (Signed)
Thank you  for update

## 2021-12-19 NOTE — H&P (View-Only) (Signed)
Referring Provider: Dr. Irine Seal  Primary Care Physician:  Dr. Coletta Memos Primary Gastroenterologist:  Dr. Rush Landmark  Reason for Consultation:  Profound iron deficiency anemia, + FOBT  HPI: Greg [name] is a 70 y.o. male with a past medical history of hypertension, coronary artery disease with possible past MI, chronic tobacco use, abdominal aortic aneurysm, scrotal cellulitis/ulcer and a chronically incarcerated right inguinal hernia.  He establish care as a new patient with Dr. Coletta Memos 11/08/2021 and initial laboratory studies identified microcytic anemia.  Hemoglobin 8.0.  Hematocrit 27.6.  MCV 76.  Repeat laboratory studies at 12/04/2021 showed a hemoglobin level of 7.1.  Hematocrit 24.6.  Iron 27.  Iron saturation 5.  TIBC 556.  Vitamin B12 level 543.  Folate 9.9.  He was provided with a GI referral for further evaluation for iron deficiency anemia.  He was seen in our GI clinic yesterday by Alonza Bogus PA-C.  At that time, he endorses passing dark-colored stools without bright red blood per the rectum. An EGD and colonoscopy were recommended, however, he agreed to proceed with an EGD but did not wish to have a colonoscopy.  An EGD was scheduled as an outpatient 12/19/2021, however, repeat laboratory studies showed a hemoglobin level dropped to 6.1 and he was sent to [facility] ED for further blood transfusion and GI evaluation.   Labs in the ED showed a WBC count of 4.5.  Hemoglobin 5.7.  Hematocrit 19.8.  MCV 83.5.  Platelet 112.  Potassium 3.2.  BUN 15.  Creatinine 0.84.  Normal LFTs.  Troponin level 112 -> 147.  EKG showed poor R wave progression V1 - V4 without acute ischemia. Negative urinalysis.  Chest x-ray was negative.  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  The ED physician contacted vascular surgery who reviewed the vascular findings on his CTA and recommended patient evaluation.  He was transfused 2 units of PRBCs.  Posttransfusion H&H 7.7.  2 additional units of PRBCs ordered by the hospitalist, not yet transfused.  He received Pantoprazole bolus and remains on 8 mg/hour infusion.  He is cooperative but a challenging historian.  He endorses taking an OTC "pain reliever" 2 tabs every 4 hours for the past 8 months for generalized chest, back and abdominal pain.  He denies taking any blood thinners.  No alcohol or drug use.  He smokes 1 pack of cigarettes daily for the past 40 years.  He has intermittent nausea and vomited clear water like emesis x 1 yesterday morning.  No coffee-ground emesis or frank red hematemesis.  No dysphagia or heartburn.  He is chronic upper abdominal pain.  No history of ulcers or GI bleed.  He reported passing black stools daily for the past year.  No bright red rectal bleeding.  He has intermittent constipation and sometimes takes MiraLAX.  He endorsed taking oral iron for 3 days recently but stopped taking it, further details are unclear.  No Pepto-Bismol use. He denies ever having an EGD or colonoscopy.  No known family history of esophageal, gastric or colon cancer.  He lives by himself.  He eats at least 1 meal daily, he stated he eats when he is hungry.  He is lost 15 pounds over the past few months.  No fever, sweats or chills.  His cardiac history is unclear.  Records from his PCP indicated he had a past MI as noted per EKG.  He has chronic chest pain.  He sometimes has dyspnea with exertion.  No hemoptysis.     Prior to Admission medications   Medication Sig  Start Date End Date Taking? Authorizing Provider  lisinopril (ZESTRIL) 5 MG tablet Take 5 mg by mouth daily.   Yes [provider]  OVER THE COUNTER MEDICATION Over the counter pain relief   Yes [provider]  guaiFENesin (TUSSIN PO) Take 10 mLs by mouth in the morning and at bedtime. Patient not taking: Reported on 12/18/2021    [provider]  mupirocin ointment (BACTROBAN) 2 % Apply topically daily. To ulcer Patient not taking: Reported on 12/18/2021 09/10/20   Gareth Morgan, MD  polyethylene glycol powder (GLYCOLAX/MIRALAX) 17 GM/SCOOP powder Dissolve one capful (17g) in water and drink daily Patient not taking: Reported on 12/18/2021 12/18/21   Zehr, Laban Emperor, PA-C  Pyridoxine HCl (VITAMIN B6 PO) Take 1 tablet by mouth daily. Patient not taking: Reported on 12/18/2021    [provider]    Current Facility-Administered Medications  Medication Dose Route Frequency Provider Last Rate Last Admin   0.9 %  sodium chloride infusion  10 mL/hr Intravenous Once Opyd, Ilene Qua, MD       0.9 % NaCl with KCl 20 mEq/ L  infusion   Intravenous Continuous Opyd, Ilene Qua, MD 75 mL/hr at 12/19/21 0014 New Bag at 12/19/21 0014   acetaminophen (TYLENOL) tablet 650 mg  650 mg Oral Q6H PRN Opyd, Ilene Qua, MD       Or   acetaminophen (TYLENOL) suppository 650 mg  650 mg Rectal Q6H PRN Opyd, Ilene Qua, MD       [START ON 12/22/2021] pantoprazole (PROTONIX) injection 40 mg  40 mg Intravenous Q12H Opyd, Ilene Qua, MD       pantoprozole (PROTONIX) 80 mg /NS 100 mL infusion  8 mg/hr Intravenous Continuous Opyd, Ilene Qua, MD 10 mL/hr at 12/18/21 2040 8 mg/hr at 12/18/21 2040   sodium chloride flush (NS) 0.9 % injection 3 mL  3 mL Intravenous Q12H Opyd, Ilene Qua, MD       Current Outpatient Medications  Medication Sig Dispense Refill   lisinopril (ZESTRIL) 5 MG tablet Take 5 mg by mouth daily.     OVER THE COUNTER MEDICATION Over the counter pain relief     guaiFENesin  (TUSSIN PO) Take 10 mLs by mouth in the morning and at bedtime. (Patient not taking: Reported on 12/18/2021)     mupirocin ointment (BACTROBAN) 2 % Apply topically daily. To ulcer (Patient not taking: Reported on 12/18/2021) 22 g 2   polyethylene glycol powder (GLYCOLAX/MIRALAX) 17 GM/SCOOP powder Dissolve one capful (17g) in water and drink daily (Patient not taking: Reported on 12/18/2021) 510 g 0   Pyridoxine HCl (VITAMIN B6 PO) Take 1 tablet by mouth daily. (Patient not taking: Reported on 12/18/2021)      Allergies as of 12/18/2021   (No Known Allergies)    Family History  Problem Relation Age of Onset   Stomach cancer Neg Hx    Colon cancer Neg Hx    Esophageal cancer Neg Hx    Pancreatic cancer  Neg Hx     Social History   Socioeconomic History   Marital status: Legally Separated    Spouse name: Not on file   Number of children: Not on file   Years of education: Not on file   Highest education level: Not on file  Occupational History   Not on file  Tobacco Use   Smoking status: Every Day    Packs/day: 1.00    Types: Cigarettes   Smokeless tobacco: Never  Vaping Use   Vaping Use: Never used  Substance and Sexual Activity   Alcohol use: Not Currently    Comment: former   Drug use: Never   Sexual activity: Not on file  Other Topics Concern   Not on file  Social History Narrative   Not on file   Social Determinants of Health   Financial Resource Strain: Not on file  Food Insecurity: Not on file  Transportation Needs: Not on file  Physical Activity: Not on file  Stress: Not on file  Social Connections: Not on file  Intimate Partner Violence: Not on file    Review of Systems: Gen: See HPI.Marland Kitchen  CV: See HPI. Resp: Denies cough, shortness of breath of hemoptysis.  GI: See HPI. GU : Denies urinary burning, blood in urine, increased urinary frequency or incontinence. MS: + Back and leg pain.  Derm: + Dry skin.  Psych: Denies depression, anxiety or significant  memory loss. Heme: Denies easy bruising, bleeding. Neuro:  Denies headaches, dizziness or paresthesias. Endo:  Denies any problems with DM, thyroid or adrenal function.  Physical Exam: Vital signs in last 24 hours: Temp:  [98.1 F (36.7 C)-98.5 F (36.9 C)] 98.2 F (36.8 C) (11/08 0741) Pulse Rate:  [67-124] 68 (11/08 0741) Resp:  [16-21] 16 (11/08 0741) BP: (111-153)/(80-109) 138/89 (11/08 0741) SpO2:  [97 %-100 %] 100 % (11/08 0741) Weight:  [59.9 kg] 59.9 kg (11/07 1716)   General: Chronically ill alert 71 year old male sitting at the side of the stretcher in no acute distress. Head:  Normocephalic and atraumatic. Eyes:  No scleral icterus. Conjunctiva pink. Ears:  Normal auditory acuity. Nose:  No deformity, discharge or lesions. Mouth: Poor dentition. No ulcers or lesions.  Neck:  Supple. No lymphadenopathy or thyromegaly.  Lungs: Breath sounds clear but decreased throughout. Heart: Regular rate and rhythm, no murmurs. Abdomen: Soft, nondistended.  Mild epigastric tenderness without rebound or guarding.  Hypoactive bowel sounds to all 4 quadrants. GU: Large right inguinal hernia with associated gross scrotal edema and erythema. Rectal: Deferred. Musculoskeletal:  Symmetrical without gross deformities.  Pulses:  Normal pulses noted. Extremities: Bilateral lower extremities with 2+ pitting edema from below the knees to ankles, 3+ pitting edema to the feet. Neurologic:  Alert and  oriented x 4.  Speech is clear.  Moves all extremities. Skin: Bilateral lower extremities with excessive skin peeling. Psych:  Alert and cooperative. Normal mood and affect.  Intake/Output from previous day: 11/07 0701 - 11/08 0700 In: 315 [Blood:315] Out: -  Intake/Output this shift: No intake/output data recorded.  Lab Results: Recent Labs    12/18/21 1125 12/18/21 1920  WBC 4.6 4.5  HGB 6.1 Repeated and verified X2.* 5.7*  HCT 20.5 Repeated and verified X2.* 19.8*  PLT 104.0* 112*    BMET Recent Labs    12/18/21 1920 12/19/21 0520  NA 141 139  K 3.2* 3.3*  CL 107 107  CO2 24 24  GLUCOSE 93 95  BUN 15 12  CREATININE 0.84 0.68  CALCIUM 9.3 8.9   LFT Recent Labs    12/18/21 1920  PROT 7.4  ALBUMIN 4.1  AST 21  ALT 15  ALKPHOS 42  BILITOT 0.4   PT/INR No results for input(s): "LABPROT", "INR" in the last 72 hours. Hepatitis Panel No results for input(s): "HEPBSAG", "HCVAB", "HEPAIGM", "HEPBIGM" in the last 72 hours.    Studies/Results: CT Angio Chest/Abd/Pel for Dissection W and/or Wo Contrast  Result Date: 12/18/2021 CLINICAL DATA:  Acute aortic syndrome suspected. Shortness of breath, known abdominal aortic aneurysm. Concern for dissection. EXAM: CT ANGIOGRAPHY CHEST, ABDOMEN AND PELVIS TECHNIQUE: Non-contrast CT of the chest was initially obtained. Multidetector CT imaging through the chest, abdomen and pelvis was performed using the standard protocol during bolus administration of intravenous contrast. Multiplanar reconstructed images and MIPs were obtained and reviewed to evaluate the vascular anatomy. RADIATION DOSE REDUCTION: This exam was performed according to the departmental dose-optimization program which includes automated exposure control, adjustment of the mA and/or kV according to patient size and/or use of iterative reconstruction technique. CONTRAST:  156m OMNIPAQUE IOHEXOL 350 MG/ML SOLN COMPARISON:  09/10/2020. FINDINGS: CTA CHEST FINDINGS Cardiovascular: The heart is normal in size and there is no pericardial effusion. Multi-vessel coronary artery calcifications are seen. There is atherosclerotic calcification of the aorta without evidence of aneurysm or dissection. The pulmonary trunk is normal in size. Mediastinum/Nodes: No mediastinal lymphadenopathy. A nonspecific prominent lymph node is present at the right hilum measuring 1 cm. No axillary lymphadenopathy. The thyroid gland, trachea, and esophagus are within normal limits.  Lungs/Pleura: Advanced paraseptal and centrilobular emphysematous changes are present in the lungs. Apical pleural scarring is present bilaterally. There is a 4 mm nodule in the right middle lobe, axial image 123. No consolidation, effusion, or pneumothorax. Musculoskeletal: Degenerative changes are present in the thoracic spine. Mild compression deformities are present in the mid thoracic spine which are indeterminate in age. Review of the MIP images confirms the above findings. CTA ABDOMEN AND PELVIS FINDINGS VASCULAR Aorta: Atherosclerotic calcification and mural thrombus of the abdominal aorta with aneurysmal dilatation of the distal abdominal aorta measuring 3.3 x 3.4 cm. The aorta has a tortuous course. No dissection is seen. Celiac: There is aneurysmal dilatation of the mid celiac artery with a small dissection flap. The celiac artery and splenic artery are patent. The hepatic artery originates from the superior mesenteric artery which is a normal variant SMA: Patent without evidence of aneurysm, dissection, vasculitis or significant stenosis. Renals: Both renal arteries are patent without evidence of aneurysm, dissection, vasculitis, fibromuscular dysplasia or significant stenosis. IMA: The visualized portion of the IMA is patent. 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 There is atherosclerotic calcification with mural thrombus with dilatation of the common iliac artery on the left measuring 1.8 cm. No dissection flap is seen. There is atherosclerotic calcification of the external and internal iliac arteries bilaterally with a mural thrombus with no significant stenosis, dissection, or aneurysm. Veins: No obvious venous abnormality within the limitations of this arterial phase study. Review of the MIP images confirms the above findings. NON-VASCULAR Hepatobiliary:  Transient hepatic arterial differences are noted. No biliary ductal dilatation. The gallbladder is without stones. Pancreas: Unremarkable. No pancreatic ductal dilatation or surrounding inflammatory changes. Spleen: Normal in size without focal abnormality. Adrenals/Urinary Tract: The adrenal glands are within normal limits. The kidneys enhance symmetrically. No renal calculus or hydronephrosis. Bladder is  unremarkable. Stomach/Bowel: There is mild thickening of the walls of the gastric antrum. No bowel obstruction, free air, or pneumatosis. There is a large right inguinal hernia with multiple loops of small and large bowel and mesentery extending into the scrotal sac on the right. The appendix is not visualized on exam. Lymphatic: No abdominal or pelvic lymphadenopathy. Reproductive: The prostate gland is enlarged. Other: No abdominopelvic ascites. Musculoskeletal: Mild degenerative changes in the lumbar spine. No acute osseous abnormality. Review of the MIP images confirms the above findings. IMPRESSION: 1. Aortic atherosclerosis with aneurysmal dilatation of the distal abdominal aorta measuring 3.3 x 3.4 cm. No dissection is seen. Recommend follow-up every 3 years. 2. 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 A vascular surgery consultation is recommended. 3. 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 4. 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 5. Advanced emphysema. 6. 4 mm right middle lobe nodule. No follow-up needed if patient is low-risk.This recommendation follows the consensus statement: Guidelines for Management of Incidental Pulmonary Nodules Detected on CT Images: From the Fleischner Society 2017; Radiology 2017; 284:228-243. 7. Enlarged right inguinal hernia extending into the scrotal sac on the right containing nonobstructed  small and large bowel and a portion of the mesentery. Electronically Signed   By: Brett Fairy M.D.   On: 12/18/2021 21:41   DG Chest 2 View  Result Date: 12/18/2021 CLINICAL DATA:  Short of breath EXAM: CHEST - 2 VIEW COMPARISON:  Chest 08/08/2020 FINDINGS: The heart size and mediastinal contours are within normal limits. Both lungs are clear. The visualized skeletal structures are unremarkable. IMPRESSION: No active cardiopulmonary disease. Electronically Signed   By: Franchot Gallo M.D.   On: 12/18/2021 18:15    IMPRESSION/PLAN:  73) 71 year old male admitted to the hospital with profound iron deficiency anemia reported dark stools with positive FOBT.  Admission hemoglobin 5.7. Transfused 2 units of PRBCs. Post transfusion H/H 7.7.  2 additional units of PRBCs ordered by the hospitalist, not yet transfused.  No melena/BM since arriving to the ED. Questionable NSAID use.  -NPO -Continue PPI infusion  -Await post transfusion H/H -Transfuse for Hg < 8 -EGD benefits and risks discussed including risk with sedation, risk of bleeding, perforation and infection. EGD timing to be determined.  Await cardiac evaluation. -Colonoscopy deferred at this time as patient does not wish to pursue a colonoscopy and this procedure would potentially be technically difficult in the setting of a large right inguinal hernia which contains a component of the small and large bowel  2) History of coronary artery disease, ? past anterior/septal MI per EKG. Elevated troponin levels, NSTEMI vs demand ischemia in setting of profound anemia.  Echo ordered. Bilateral LE edema.  -Cardiac clearance wire prior to pursuing endoscopic evaluation  3) AAA, bilateral common iliac artery aneurysms -Out patient vascular surgery evaluation  4) Large right inguinal hernia extending into the scrotal sac, containing nonobstructed small and large bowel.  Patient was previously evaluated by general surgery and he declined surgical  intervention.  5) Chronic tobacco use, 80m pulmonary nodule per CTA  6) Hypokalemia -KCl replacement per the hospitalist      CNoralyn Pick 12/19/2021, 9:08AM
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
## 2021-12-19 NOTE — Consult Note (Signed)
Referring Provider: Dr. Irine Seal  Primary Care Physician:  Dr. Coletta Memos Primary Gastroenterologist:  Dr. Rush Landmark  Reason for Consultation:  Profound iron deficiency anemia, + FOBT  HPI: Greg [name] is a 71 y.o. male with a past medical history of hypertension, coronary artery disease with possible past MI, chronic tobacco use, abdominal aortic aneurysm, scrotal cellulitis/ulcer and a chronically incarcerated right inguinal hernia.  He establish care as a new patient with Dr. Coletta Memos 11/08/2021 and initial laboratory studies identified microcytic anemia.  Hemoglobin 8.0.  Hematocrit 27.6.  MCV 76.  Repeat laboratory studies at 12/04/2021 showed a hemoglobin level of 7.1.  Hematocrit 24.6.  Iron 27.  Iron saturation 5.  TIBC 556.  Vitamin B12 level 543.  Folate 9.9.  He was provided with a GI referral for further evaluation for iron deficiency anemia.  He was seen in our GI clinic yesterday by Alonza Bogus PA-C.  At that time, he endorses passing dark-colored stools without bright red blood per the rectum. An EGD and colonoscopy were recommended, however, he agreed to proceed with an EGD but did not wish to have a colonoscopy.  An EGD was scheduled as an outpatient 12/19/2021, however, repeat laboratory studies showed a hemoglobin level dropped to 6.1 and he was sent to [facility] ED for further blood transfusion and GI evaluation.   Labs in the ED showed a WBC count of 4.5.  Hemoglobin 5.7.  Hematocrit 19.8.  MCV 83.5.  Platelet 112.  Potassium 3.2.  BUN 15.  Creatinine 0.84.  Normal LFTs.  Troponin level 112 -> 147.  EKG showed poor R wave progression V1 - V4 without acute ischemia. Negative urinalysis.  Chest x-ray was negative.  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  The ED physician contacted vascular surgery who reviewed the vascular findings on his CTA and recommended patient evaluation.  He was transfused 2 units of PRBCs.  Posttransfusion H&H 7.7.  2 additional units of PRBCs ordered by the hospitalist, not yet transfused.  He received Pantoprazole bolus and remains on 8 mg/hour infusion.  He is cooperative but a challenging historian.  He endorses taking an OTC "pain reliever" 2 tabs every 4 hours for the past 8 months for generalized chest, back and abdominal pain.  He denies taking any blood thinners.  No alcohol or drug use.  He smokes 1 pack of cigarettes daily for the past 40 years.  He has intermittent nausea and vomited clear water like emesis x 1 yesterday morning.  No coffee-ground emesis or frank red hematemesis.  No dysphagia or heartburn.  He is chronic upper abdominal pain.  No history of ulcers or GI bleed.  He reported passing black stools daily for the past year.  No bright red rectal bleeding.  He has intermittent constipation and sometimes takes MiraLAX.  He endorsed taking oral iron for 3 days recently but stopped taking it, further details are unclear.  No Pepto-Bismol use. He denies ever having an EGD or colonoscopy.  No known family history of esophageal, gastric or colon cancer.  He lives by himself.  He eats at least 1 meal daily, he stated he eats when he is hungry.  He is lost 15 pounds over the past few months.  No fever, sweats or chills.  His cardiac history is unclear.  Records from his PCP indicated he had a past MI as noted per EKG.  He has chronic chest pain.  He sometimes has dyspnea with exertion.  No hemoptysis.     Prior to Admission medications   Medication Sig  Start Date End Date Taking? Authorizing Provider  lisinopril (ZESTRIL) 5 MG tablet Take 5 mg by mouth daily.   Yes [provider]  OVER THE COUNTER MEDICATION Over the counter pain relief   Yes [provider]  guaiFENesin (TUSSIN PO) Take 10 mLs by mouth in the morning and at bedtime. Patient not taking: Reported on 12/18/2021    [provider]  mupirocin ointment (BACTROBAN) 2 % Apply topically daily. To ulcer Patient not taking: Reported on 12/18/2021 09/10/20   Gareth Morgan, MD  polyethylene glycol powder (GLYCOLAX/MIRALAX) 17 GM/SCOOP powder Dissolve one capful (17g) in water and drink daily Patient not taking: Reported on 12/18/2021 12/18/21   Zehr, Laban Emperor, PA-C  Pyridoxine HCl (VITAMIN B6 PO) Take 1 tablet by mouth daily. Patient not taking: Reported on 12/18/2021    [provider]    Current Facility-Administered Medications  Medication Dose Route Frequency Provider Last Rate Last Admin   0.9 %  sodium chloride infusion  10 mL/hr Intravenous Once Opyd, Ilene Qua, MD       0.9 % NaCl with KCl 20 mEq/ L  infusion   Intravenous Continuous Opyd, Ilene Qua, MD 75 mL/hr at 12/19/21 0014 New Bag at 12/19/21 0014   acetaminophen (TYLENOL) tablet 650 mg  650 mg Oral Q6H PRN Opyd, Ilene Qua, MD       Or   acetaminophen (TYLENOL) suppository 650 mg  650 mg Rectal Q6H PRN Opyd, Ilene Qua, MD       [START ON 12/22/2021] pantoprazole (PROTONIX) injection 40 mg  40 mg Intravenous Q12H Opyd, Ilene Qua, MD       pantoprozole (PROTONIX) 80 mg /NS 100 mL infusion  8 mg/hr Intravenous Continuous Opyd, Ilene Qua, MD 10 mL/hr at 12/18/21 2040 8 mg/hr at 12/18/21 2040   sodium chloride flush (NS) 0.9 % injection 3 mL  3 mL Intravenous Q12H Opyd, Ilene Qua, MD       Current Outpatient Medications  Medication Sig Dispense Refill   lisinopril (ZESTRIL) 5 MG tablet Take 5 mg by mouth daily.     OVER THE COUNTER MEDICATION Over the counter pain relief     guaiFENesin  (TUSSIN PO) Take 10 mLs by mouth in the morning and at bedtime. (Patient not taking: Reported on 12/18/2021)     mupirocin ointment (BACTROBAN) 2 % Apply topically daily. To ulcer (Patient not taking: Reported on 12/18/2021) 22 g 2   polyethylene glycol powder (GLYCOLAX/MIRALAX) 17 GM/SCOOP powder Dissolve one capful (17g) in water and drink daily (Patient not taking: Reported on 12/18/2021) 510 g 0   Pyridoxine HCl (VITAMIN B6 PO) Take 1 tablet by mouth daily. (Patient not taking: Reported on 12/18/2021)      Allergies as of 12/18/2021   (No Known Allergies)    Family History  Problem Relation Age of Onset   Stomach cancer Neg Hx    Colon cancer Neg Hx    Esophageal cancer Neg Hx    Pancreatic cancer  Neg Hx     Social History   Socioeconomic History   Marital status: Legally Separated    Spouse name: Not on file   Number of children: Not on file   Years of education: Not on file   Highest education level: Not on file  Occupational History   Not on file  Tobacco Use   Smoking status: Every Day    Packs/day: 1.00    Types: Cigarettes   Smokeless tobacco: Never  Vaping Use   Vaping Use: Never used  Substance and Sexual Activity   Alcohol use: Not Currently    Comment: former   Drug use: Never   Sexual activity: Not on file  Other Topics Concern   Not on file  Social History Narrative   Not on file   Social Determinants of Health   Financial Resource Strain: Not on file  Food Insecurity: Not on file  Transportation Needs: Not on file  Physical Activity: Not on file  Stress: Not on file  Social Connections: Not on file  Intimate Partner Violence: Not on file    Review of Systems: Gen: See HPI.Marland Kitchen  CV: See HPI. Resp: Denies cough, shortness of breath of hemoptysis.  GI: See HPI. GU : Denies urinary burning, blood in urine, increased urinary frequency or incontinence. MS: + Back and leg pain.  Derm: + Dry skin.  Psych: Denies depression, anxiety or significant  memory loss. Heme: Denies easy bruising, bleeding. Neuro:  Denies headaches, dizziness or paresthesias. Endo:  Denies any problems with DM, thyroid or adrenal function.  Physical Exam: Vital signs in last 24 hours: Temp:  [98.1 F (36.7 C)-98.5 F (36.9 C)] 98.2 F (36.8 C) (11/08 0741) Pulse Rate:  [67-124] 68 (11/08 0741) Resp:  [16-21] 16 (11/08 0741) BP: (111-153)/(80-109) 138/89 (11/08 0741) SpO2:  [97 %-100 %] 100 % (11/08 0741) Weight:  [59.9 kg] 59.9 kg (11/07 1716)   General: Chronically ill alert 71 year old male sitting at the side of the stretcher in no acute distress. Head:  Normocephalic and atraumatic. Eyes:  No scleral icterus. Conjunctiva pink. Ears:  Normal auditory acuity. Nose:  No deformity, discharge or lesions. Mouth: Poor dentition. No ulcers or lesions.  Neck:  Supple. No lymphadenopathy or thyromegaly.  Lungs: Breath sounds clear but decreased throughout. Heart: Regular rate and rhythm, no murmurs. Abdomen: Soft, nondistended.  Mild epigastric tenderness without rebound or guarding.  Hypoactive bowel sounds to all 4 quadrants. GU: Large right inguinal hernia with associated gross scrotal edema and erythema. Rectal: Deferred. Musculoskeletal:  Symmetrical without gross deformities.  Pulses:  Normal pulses noted. Extremities: Bilateral lower extremities with 2+ pitting edema from below the knees to ankles, 3+ pitting edema to the feet. Neurologic:  Alert and  oriented x 4.  Speech is clear.  Moves all extremities. Skin: Bilateral lower extremities with excessive skin peeling. Psych:  Alert and cooperative. Normal mood and affect.  Intake/Output from previous day: 11/07 0701 - 11/08 0700 In: 315 [Blood:315] Out: -  Intake/Output this shift: No intake/output data recorded.  Lab Results: Recent Labs    12/18/21 1125 12/18/21 1920  WBC 4.6 4.5  HGB 6.1 Repeated and verified X2.* 5.7*  HCT 20.5 Repeated and verified X2.* 19.8*  PLT 104.0* 112*    BMET Recent Labs    12/18/21 1920 12/19/21 0520  NA 141 139  K 3.2* 3.3*  CL 107 107  CO2 24 24  GLUCOSE 93 95  BUN 15 12  CREATININE 0.84 0.68  CALCIUM 9.3 8.9   LFT Recent Labs    12/18/21 1920  PROT 7.4  ALBUMIN 4.1  AST 21  ALT 15  ALKPHOS 42  BILITOT 0.4   PT/INR No results for input(s): "LABPROT", "INR" in the last 72 hours. Hepatitis Panel No results for input(s): "HEPBSAG", "HCVAB", "HEPAIGM", "HEPBIGM" in the last 72 hours.    Studies/Results: CT Angio Chest/Abd/Pel for Dissection W and/or Wo Contrast  Result Date: 12/18/2021 CLINICAL DATA:  Acute aortic syndrome suspected. Shortness of breath, known abdominal aortic aneurysm. Concern for dissection. EXAM: CT ANGIOGRAPHY CHEST, ABDOMEN AND PELVIS TECHNIQUE: Non-contrast CT of the chest was initially obtained. Multidetector CT imaging through the chest, abdomen and pelvis was performed using the standard protocol during bolus administration of intravenous contrast. Multiplanar reconstructed images and MIPs were obtained and reviewed to evaluate the vascular anatomy. RADIATION DOSE REDUCTION: This exam was performed according to the departmental dose-optimization program which includes automated exposure control, adjustment of the mA and/or kV according to patient size and/or use of iterative reconstruction technique. CONTRAST:  148m OMNIPAQUE IOHEXOL 350 MG/ML SOLN COMPARISON:  09/10/2020. FINDINGS: CTA CHEST FINDINGS Cardiovascular: The heart is normal in size and there is no pericardial effusion. Multi-vessel coronary artery calcifications are seen. There is atherosclerotic calcification of the aorta without evidence of aneurysm or dissection. The pulmonary trunk is normal in size. Mediastinum/Nodes: No mediastinal lymphadenopathy. A nonspecific prominent lymph node is present at the right hilum measuring 1 cm. No axillary lymphadenopathy. The thyroid gland, trachea, and esophagus are within normal limits.  Lungs/Pleura: Advanced paraseptal and centrilobular emphysematous changes are present in the lungs. Apical pleural scarring is present bilaterally. There is a 4 mm nodule in the right middle lobe, axial image 123. No consolidation, effusion, or pneumothorax. Musculoskeletal: Degenerative changes are present in the thoracic spine. Mild compression deformities are present in the mid thoracic spine which are indeterminate in age. Review of the MIP images confirms the above findings. CTA ABDOMEN AND PELVIS FINDINGS VASCULAR Aorta: Atherosclerotic calcification and mural thrombus of the abdominal aorta with aneurysmal dilatation of the distal abdominal aorta measuring 3.3 x 3.4 cm. The aorta has a tortuous course. No dissection is seen. Celiac: There is aneurysmal dilatation of the mid celiac artery with a small dissection flap. The celiac artery and splenic artery are patent. The hepatic artery originates from the superior mesenteric artery which is a normal variant SMA: Patent without evidence of aneurysm, dissection, vasculitis or significant stenosis. Renals: Both renal arteries are patent without evidence of aneurysm, dissection, vasculitis, fibromuscular dysplasia or significant stenosis. IMA: The visualized portion of the IMA is patent. 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 There is atherosclerotic calcification with mural thrombus with dilatation of the common iliac artery on the left measuring 1.8 cm. No dissection flap is seen. There is atherosclerotic calcification of the external and internal iliac arteries bilaterally with a mural thrombus with no significant stenosis, dissection, or aneurysm. Veins: No obvious venous abnormality within the limitations of this arterial phase study. Review of the MIP images confirms the above findings. NON-VASCULAR Hepatobiliary:  Transient hepatic arterial differences are noted. No biliary ductal dilatation. The gallbladder is without stones. Pancreas: Unremarkable. No pancreatic ductal dilatation or surrounding inflammatory changes. Spleen: Normal in size without focal abnormality. Adrenals/Urinary Tract: The adrenal glands are within normal limits. The kidneys enhance symmetrically. No renal calculus or hydronephrosis. Bladder is  unremarkable. Stomach/Bowel: There is mild thickening of the walls of the gastric antrum. No bowel obstruction, free air, or pneumatosis. There is a large right inguinal hernia with multiple loops of small and large bowel and mesentery extending into the scrotal sac on the right. The appendix is not visualized on exam. Lymphatic: No abdominal or pelvic lymphadenopathy. Reproductive: The prostate gland is enlarged. Other: No abdominopelvic ascites. Musculoskeletal: Mild degenerative changes in the lumbar spine. No acute osseous abnormality. Review of the MIP images confirms the above findings. IMPRESSION: 1. Aortic atherosclerosis with aneurysmal dilatation of the distal abdominal aorta measuring 3.3 x 3.4 cm. No dissection is seen. Recommend follow-up every 3 years. 2. 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 A vascular surgery consultation is recommended. 3. 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 4. 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 5. Advanced emphysema. 6. 4 mm right middle lobe nodule. No follow-up needed if patient is low-risk.This recommendation follows the consensus statement: Guidelines for Management of Incidental Pulmonary Nodules Detected on CT Images: From the Fleischner Society 2017; Radiology 2017; 284:228-243. 7. Enlarged right inguinal hernia extending into the scrotal sac on the right containing nonobstructed  small and large bowel and a portion of the mesentery. Electronically Signed   By: Brett Fairy M.D.   On: 12/18/2021 21:41   DG Chest 2 View  Result Date: 12/18/2021 CLINICAL DATA:  Short of breath EXAM: CHEST - 2 VIEW COMPARISON:  Chest 08/08/2020 FINDINGS: The heart size and mediastinal contours are within normal limits. Both lungs are clear. The visualized skeletal structures are unremarkable. IMPRESSION: No active cardiopulmonary disease. Electronically Signed   By: Franchot Gallo M.D.   On: 12/18/2021 18:15    IMPRESSION/PLAN:  66) 71 year old male admitted to the hospital with profound iron deficiency anemia reported dark stools with positive FOBT.  Admission hemoglobin 5.7. Transfused 2 units of PRBCs. Post transfusion H/H 7.7.  2 additional units of PRBCs ordered by the hospitalist, not yet transfused.  No melena/BM since arriving to the ED. Questionable NSAID use.  -NPO -Continue PPI infusion  -Await post transfusion H/H -Transfuse for Hg < 8 -EGD benefits and risks discussed including risk with sedation, risk of bleeding, perforation and infection. EGD timing to be determined.  Await cardiac evaluation. -Colonoscopy deferred at this time as patient does not wish to pursue a colonoscopy and this procedure would potentially be technically difficult in the setting of a large right inguinal hernia which contains a component of the small and large bowel  2) History of coronary artery disease, ? past anterior/septal MI per EKG. Elevated troponin levels, NSTEMI vs demand ischemia in setting of profound anemia.  Echo ordered. Bilateral LE edema.  -Cardiac clearance wire prior to pursuing endoscopic evaluation  3) AAA, bilateral common iliac artery aneurysms -Out patient vascular surgery evaluation  4) Large right inguinal hernia extending into the scrotal sac, containing nonobstructed small and large bowel.  Patient was previously evaluated by general surgery and he declined surgical  intervention.  5) Chronic tobacco use, 42m pulmonary nodule per CTA  6) Hypokalemia -KCl replacement per the hospitalist      CNoralyn Pick 12/19/2021, 9:08AM

## 2021-12-19 NOTE — Progress Notes (Signed)
Attending Physician's Attestation   I have reviewed the chart.   I agree with the Advanced Practitioner's note, impression, and recommendations with any updates as below. EGD/Colonoscopy recommended, but if patient only wants EGD to begin workup then we will do what we can.  CBC pending for follow up to ensure ability to perform procedure safely in [place].   Justice Britain, MD [facility] Advanced Endoscopy Office # [phone]

## 2021-12-19 NOTE — Progress Notes (Signed)
PROGRESS NOTE    Greg [name]  YIR:[id number] DOB: 1950/10/15 DOA: 12/18/2021 PCP: Pcp, No    Chief Complaint  Patient presents with   Shortness of Breath    Brief Narrative:  Patient 71 year old gentleman history of iron deficiency anemia, AAA, hypertension presented to ED for evaluation of low hemoglobin.  Patient noted to have been referred to GI for iron deficiency anemia had blood work performed with a hemoglobin of 6.1 and sent to the ED.  Patient does endorse some intermittent shortness of breath, intermittent diffuse chest pain as well as melanotic stools.  Patient seen in the ED afebrile, noted to have sats on room air with stable vitals.  Chest x-ray negative for any cardiopulmonary disease.  Hemoglobin noted at 5.7 and platelet count of 112.  FOBT was positive.  Troponins elevated.  Potassium noted at 3.2.  CT angiogram chest abdomen and pelvis notable for 3.3 x 3.4 cm AAA without dissection, aneurysm of the right iliac artery with small dissection flap, aneurysm of the left common iliac artery, mild dilatation of proximal celiac artery with small dissection flap, emphysema, 4 mm right middle lobe nodule.  It is noted per admitting physician ED discussed case with vascular surgery who reviewed images and recommended outpatient follow-up.  Patient placed on IV Protonix, transfused 1 units RBCs with posttransfusion CBC of approximately 7.7.  Due to elevated troponins, concern for demand ischemia 2 more units of packed red blood cells ordered.  GI consulted.  Cardiology consulted.   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 -Repeat CBC with hemoglobin of 5.7. -Patient status post 2 units packed red blood cells with posttransfusion hemoglobin of 7.7. -Due to rising troponins, concern for demand ischemia we will transfuse 2 more units packed red blood cells to keep hemoglobin in the 8-10 range. -Continue Protonix drip. -GI consulted recommending upper endoscopy/EGD to be done after cardiology evaluation. -Per GI colonoscopy deferred at this time as patient does not wish to pursue a colonoscopy and felt to be technically difficult in the setting of large right inguinal hernia which contains a component of small and large bowel. -Follow H&H, transfusion threshold hemoglobin < 8.  2.  Elevated troponin/history of CAD/?  Anterior/septal MI per EKG -Likely secondary to demand ischemia from problem #1. -Patient with lower extremity bilateral edema. -2D echo pending. -Patient seen in consultation by cardiology who feels likely elevated troponin in the setting of profound anemia and due to anemia not a candidate for cath even if 2D echo is abnormal and recommended no aspirin/no heparin given concern for blood loss anemia, and no further work-up to be considered until GI evaluation is completed and source of blood loss managed. -Appreciate cardiology input and recommendations.  3.  AAA/?  Flap/bilateral common iliac artery aneurysm -Per admitting physician, ED's note discussed with vascular surgery who recommended outpatient follow-up.  4.  Hypertension -Continue home regimen lisinopril.  5.  Hypokalemia -Repleted.  6.  Thrombocytopenia -??  Etiology. -No signs  of acute infection, patient denies any alcohol use.  No mention of schistocytes on smear review. -HIV nonreactive. -Vitamin B12 at 370 -Folate at 7.2. -Copper pending. -Follow-up.  7.  Lung nodule -Admitting physician  discussed with patient. -Outpatient follow-up with pulmonary.   DVT prophylaxis: SCDs Code Status: Full Family Communication: Updated patient.  No family at bedside. Disposition: TBD  Status is: Inpatient Remains inpatient appropriate because: Severity of illness   Consultants:  Cardiology Gastroenterology  Procedures:  CT angiogram chest abdomen and pelvis 12/18/2021 Chest x-ray 12/18/2021 2D echo pending 12/19/2021 Transfusion 3 units packed red blood cells  Antimicrobials:  None   Subjective: Patient laying in bed.  Denies any ongoing chest pain or shortness of breath however states has been having some intermittent chest pain for over a year with some intermittent shortness of breath.  Patient states had melanotic stool last stool was yesterday.  Currently receiving third unit of packed red blood cells.  Objective: Vitals:   12/19/21 0953 12/19/21 1200 12/19/21 1300 12/19/21 1512  BP: (!) 149/88 (!) 145/94 (!) 147/89 (!) 160/102  Pulse: 69 97 60 68  Resp: 17 (!) '24 17 18  '$ Temp: 97.7 F (36.5 C) 98 F (36.7 C)  97.9 F (36.6 C)  TempSrc: Oral Oral  Oral  SpO2: 99% 99% 99% 100%  Weight:      Height:        Intake/Output Summary (Last 24 hours) at 12/19/2021 1556 Last data filed at 12/19/2021 1346 Gross per 24 hour  Intake 1705.99 ml  Output 1000 ml  Net 705.99 ml   Filed Weights   12/18/21 1716  Weight: 59.9 kg    Examination:  General exam: Frail.  NAD. Respiratory system: Clear to auscultation.  No wheezes, no crackles, no rhonchi.  Respiratory effort normal. Cardiovascular system: Regular rate rhythm no murmurs rubs or gallops.  No JVD.  2+ bilateral lower extremity edema. Gastrointestinal system: Abdomen is nondistended, soft and nontender. No organomegaly or masses felt. Normal bowel sounds heard. Central nervous system: Alert and oriented. No focal neurological deficits. Extremities: 2+ bilateral lower extremity edema.  Skin: No rashes, lesions or  ulcers Psychiatry: Judgement and insight appear fair. Mood & affect appropriate.     Data Reviewed: I have personally reviewed following labs and imaging studies  CBC: Recent Labs  Lab 12/18/21 1125 12/18/21 1920 12/19/21 0731  WBC 4.6 4.5 5.0  NEUTROABS 2.8 R 2.9  --   HGB 6.1 Repeated and verified X2.* 5.7* 7.7*  HCT 20.5 Repeated and verified X2.* 19.8* 25.4*  MCV 79.0 83.5 84.7  PLT 104.0* 112* 95*    Basic Metabolic Panel: Recent Labs  Lab 12/18/21 1920 12/19/21 0520  NA 141 139  K 3.2* 3.3*  CL 107 107  CO2 24 24  GLUCOSE 93 95  BUN 15 12  CREATININE 0.84 0.68  CALCIUM 9.3 8.9  MG  --  1.8    GFR: Estimated Creatinine Clearance: 72.8 mL/min (by C-G formula based on SCr of 0.68 mg/dL).  Liver Function Tests: Recent Labs  Lab 12/18/21 1920  AST 21  ALT 15  ALKPHOS 42  BILITOT 0.4  PROT 7.4  ALBUMIN 4.1    CBG: No results for input(s): "GLUCAP" in the last 168 hours.   No results found for this or any previous visit (from the past 240 hour(s)).       Radiology Studies: CT Angio Chest/Abd/Pel for Dissection W and/or Wo Contrast  Result Date: 12/18/2021 CLINICAL DATA:  Acute  aortic syndrome suspected. Shortness of breath, known abdominal aortic aneurysm. Concern for dissection. EXAM: CT ANGIOGRAPHY CHEST, ABDOMEN AND PELVIS TECHNIQUE: Non-contrast CT of the chest was initially obtained. Multidetector CT imaging through the chest, abdomen and pelvis was performed using the standard protocol during bolus administration of intravenous contrast. Multiplanar reconstructed images and MIPs were obtained and reviewed to evaluate the vascular anatomy. RADIATION DOSE REDUCTION: This exam was performed according to the departmental dose-optimization program which includes automated exposure control, adjustment of the mA and/or kV according to patient size and/or use of iterative reconstruction technique. CONTRAST:  159m OMNIPAQUE IOHEXOL 350 MG/ML SOLN  COMPARISON:  09/10/2020. FINDINGS: CTA CHEST FINDINGS Cardiovascular: The heart is normal in size and there is no pericardial effusion. Multi-vessel coronary artery calcifications are seen. There is atherosclerotic calcification of the aorta without evidence of aneurysm or dissection. The pulmonary trunk is normal in size. Mediastinum/Nodes: No mediastinal lymphadenopathy. A nonspecific prominent lymph node is present at the right hilum measuring 1 cm. No axillary lymphadenopathy. The thyroid gland, trachea, and esophagus are within normal limits. Lungs/Pleura: Advanced paraseptal and centrilobular emphysematous changes are present in the lungs. Apical pleural scarring is present bilaterally. There is a 4 mm nodule in the right middle lobe, axial image 123. No consolidation, effusion, or pneumothorax. Musculoskeletal: Degenerative changes are present in the thoracic spine. Mild compression deformities are present in the mid thoracic spine which are indeterminate in age. Review of the MIP images confirms the above findings. CTA ABDOMEN AND PELVIS FINDINGS VASCULAR Aorta: Atherosclerotic calcification and mural thrombus of the abdominal aorta with aneurysmal dilatation of the distal abdominal aorta measuring 3.3 x 3.4 cm. The aorta has a tortuous course. No dissection is seen. Celiac: There is aneurysmal dilatation of the mid celiac artery with a small dissection flap. The celiac artery and splenic artery are patent. The hepatic artery originates from the superior mesenteric artery which is a normal variant SMA: Patent without evidence of aneurysm, dissection, vasculitis or significant stenosis. Renals: Both renal arteries are patent without evidence of aneurysm, dissection, vasculitis, fibromuscular dysplasia or significant stenosis. IMA: The visualized portion of the IMA is patent. Inflow: There is atherosclerotic calcification with mural thrombus and aneurysmal dilatation of the common iliac artery on the right  measuring 2.8 cm with a small dissection flap. There is atherosclerotic calcification with mural thrombus with dilatation of the common iliac artery on the left measuring 1.8 cm. No dissection flap is seen. There is atherosclerotic calcification of the external and internal iliac arteries bilaterally with a mural thrombus with no significant stenosis, dissection, or aneurysm. Veins: No obvious venous abnormality within the limitations of this arterial phase study. Review of the MIP images confirms the above findings. NON-VASCULAR Hepatobiliary: Transient hepatic arterial differences are noted. No biliary ductal dilatation. The gallbladder is without stones. Pancreas: Unremarkable. No pancreatic ductal dilatation or surrounding inflammatory changes. Spleen: Normal in size without focal abnormality. Adrenals/Urinary Tract: The adrenal glands are within normal limits. The kidneys enhance symmetrically. No renal calculus or hydronephrosis. Bladder is unremarkable. Stomach/Bowel: There is mild thickening of the walls of the gastric antrum. No bowel obstruction, free air, or pneumatosis. There is a large right inguinal hernia with multiple loops of small and large bowel and mesentery extending into the scrotal sac on the right. The appendix is not visualized on exam. Lymphatic: No abdominal or pelvic lymphadenopathy. Reproductive: The prostate gland is enlarged. Other: No abdominopelvic ascites. Musculoskeletal: Mild degenerative changes in the lumbar spine. No acute osseous abnormality. Review  of the MIP images confirms the above findings. IMPRESSION: 1. Aortic atherosclerosis with aneurysmal dilatation of the distal abdominal aorta measuring 3.3 x 3.4 cm. No dissection is seen. Recommend follow-up every 3 years. 2. 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 A vascular surgery consultation is recommended. 3.  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 4. 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 5. Advanced emphysema. 6. 4 mm right middle lobe nodule. No follow-up needed if patient is low-risk.This recommendation follows the consensus statement: Guidelines for Management of Incidental Pulmonary Nodules Detected on CT Images: From the Fleischner Society 2017; Radiology 2017; 284:228-243. 7. Enlarged right inguinal hernia extending into the scrotal sac on the right containing nonobstructed small and large bowel and a portion of the mesentery. Electronically Signed   By: Brett Fairy M.D.   On: 12/18/2021 21:41   DG Chest 2 View  Result Date: 12/18/2021 CLINICAL DATA:  Short of breath EXAM: CHEST - 2 VIEW COMPARISON:  Chest 08/08/2020 FINDINGS: The heart size and mediastinal contours are within normal limits. Both lungs are clear. The visualized skeletal structures are unremarkable. IMPRESSION: No active cardiopulmonary disease. Electronically Signed   By: Franchot Gallo M.D.   On: 12/18/2021 18:15        Scheduled Meds:  lisinopril  10 mg Oral Daily   [START ON 12/22/2021] pantoprazole  40 mg Intravenous Q12H   sodium chloride flush  3 mL Intravenous Q12H   Continuous Infusions:  sodium chloride     pantoprazole 8 mg/hr (12/19/21 0743)     LOS: 1 day    Time spent: 45 minutes    Irine Seal, MD [facility]   To contact the attending provider between 7A-7P or the covering provider during after hours 7P-7A, please log into the web site [email or web address] and access using universal [place] password for that web site. If you do not have the password, please call the hospital operator.  12/19/2021, 3:56 PM

## 2021-12-20 ENCOUNTER — Encounter (HOSPITAL_COMMUNITY): Payer: Self-pay | Admitting: Family Medicine

## 2021-12-20 ENCOUNTER — Inpatient Hospital Stay (HOSPITAL_COMMUNITY): Payer: Medicare Other | Admitting: Anesthesiology

## 2021-12-20 ENCOUNTER — Telehealth: Payer: Self-pay

## 2021-12-20 ENCOUNTER — Encounter (HOSPITAL_COMMUNITY)
Admission: EM | Disposition: A | Discharge status: Home Health | Payer: Self-pay | Source: Home / Self Care | Attending: Internal Medicine

## 2021-12-20 DIAGNOSIS — I255 Ischemic cardiomyopathy: Secondary | ICD-10-CM | POA: Diagnosis not present

## 2021-12-20 DIAGNOSIS — K2289 Other specified disease of esophagus: Secondary | ICD-10-CM

## 2021-12-20 DIAGNOSIS — D649 Anemia, unspecified: Secondary | ICD-10-CM | POA: Diagnosis not present

## 2021-12-20 DIAGNOSIS — K254 Chronic or unspecified gastric ulcer with hemorrhage: Secondary | ICD-10-CM

## 2021-12-20 DIAGNOSIS — K259 Gastric ulcer, unspecified as acute or chronic, without hemorrhage or perforation: Secondary | ICD-10-CM

## 2021-12-20 DIAGNOSIS — I214 Non-ST elevation (NSTEMI) myocardial infarction: Secondary | ICD-10-CM

## 2021-12-20 DIAGNOSIS — D509 Iron deficiency anemia, unspecified: Secondary | ICD-10-CM

## 2021-12-20 DIAGNOSIS — D500 Iron deficiency anemia secondary to blood loss (chronic): Secondary | ICD-10-CM

## 2021-12-20 DIAGNOSIS — I10 Essential (primary) hypertension: Secondary | ICD-10-CM

## 2021-12-20 DIAGNOSIS — R7989 Other specified abnormal findings of blood chemistry: Secondary | ICD-10-CM | POA: Diagnosis not present

## 2021-12-20 DIAGNOSIS — F17210 Nicotine dependence, cigarettes, uncomplicated: Secondary | ICD-10-CM

## 2021-12-20 DIAGNOSIS — K922 Gastrointestinal hemorrhage, unspecified: Secondary | ICD-10-CM | POA: Diagnosis not present

## 2021-12-20 DIAGNOSIS — K3189 Other diseases of stomach and duodenum: Secondary | ICD-10-CM

## 2021-12-20 HISTORY — PX: BIOPSY: SHX5522

## 2021-12-20 HISTORY — PX: ESOPHAGOGASTRODUODENOSCOPY (EGD) WITH PROPOFOL: SHX5813

## 2021-12-20 LAB — CBC
HCT: 29.7 % — ABNORMAL LOW (ref 39.0–52.0)
HCT: 31.6 % — ABNORMAL LOW (ref 39.0–52.0)
HCT: 33.9 % — ABNORMAL LOW (ref 39.0–52.0)
Hemoglobin: 9.5 g/dL — ABNORMAL LOW (ref 13.0–17.0)
Hemoglobin: 10 g/dL — ABNORMAL LOW (ref 13.0–17.0)
Hemoglobin: 11 g/dL — ABNORMAL LOW (ref 13.0–17.0)
MCH: 26.2 pg (ref 26.0–34.0)
MCH: 26 pg (ref 26.0–34.0)
MCH: 26.8 pg (ref 26.0–34.0)
MCHC: 32 g/dL (ref 30.0–36.0)
MCHC: 31.6 g/dL (ref 30.0–36.0)
MCHC: 32.4 g/dL (ref 30.0–36.0)
MCV: 82 fL (ref 80.0–100.0)
MCV: 82.3 fL (ref 80.0–100.0)
MCV: 82.7 fL (ref 80.0–100.0)
Platelets: 99 10*3/uL — ABNORMAL LOW (ref 150–400)
Platelets: 105 10*3/uL — ABNORMAL LOW (ref 150–400)
Platelets: 109 10*3/uL — ABNORMAL LOW (ref 150–400)
RBC: 3.62 MIL/uL — ABNORMAL LOW (ref 4.22–5.81)
RBC: 3.84 MIL/uL — ABNORMAL LOW (ref 4.22–5.81)
RBC: 4.1 MIL/uL — ABNORMAL LOW (ref 4.22–5.81)
RDW: 17.9 % — ABNORMAL HIGH (ref 11.5–15.5)
RDW: 18.2 % — ABNORMAL HIGH (ref 11.5–15.5)
RDW: 18.3 % — ABNORMAL HIGH (ref 11.5–15.5)
WBC: 5.6 10*3/uL (ref 4.0–10.5)
WBC: 5.4 10*3/uL (ref 4.0–10.5)
WBC: 6.2 10*3/uL (ref 4.0–10.5)
nRBC: 0.4 % — ABNORMAL HIGH (ref 0.0–0.2)
nRBC: 0 % (ref 0.0–0.2)
nRBC: 0 % (ref 0.0–0.2)

## 2021-12-20 LAB — BASIC METABOLIC PANEL
Anion gap: 10 (ref 5–15)
BUN: 8 mg/dL (ref 8–23)
CO2: 24 mmol/L (ref 22–32)
Calcium: 8.8 mg/dL — ABNORMAL LOW (ref 8.9–10.3)
Chloride: 105 mmol/L (ref 98–111)
Creatinine, Ser: 0.8 mg/dL (ref 0.61–1.24)
GFR, Estimated: >60 mL/min (ref >60)
Glucose, Bld: 84 mg/dL (ref 70–99)
Potassium: 3.4 mmol/L — ABNORMAL LOW (ref 3.5–5.1)
Sodium: 139 mmol/L (ref 135–145)

## 2021-12-20 LAB — MAGNESIUM: Magnesium: 2.2 mg/dL (ref 1.7–2.4)

## 2021-12-20 LAB — BRAIN NATRIURETIC PEPTIDE: B Natriuretic Peptide: 502.6 pg/mL — ABNORMAL HIGH (ref 0.0–100.0)

## 2021-12-20 SURGERY — ESOPHAGOGASTRODUODENOSCOPY (EGD) WITH PROPOFOL
Anesthesia: Monitor Anesthesia Care

## 2021-12-20 MED ORDER — POTASSIUM CHLORIDE CRYS ER 10 MEQ PO TBCR: 40.0000 meq | EXTENDED_RELEASE_TABLET | Freq: Once | ORAL | Status: DC

## 2021-12-20 MED ORDER — PROPOFOL 10 MG/ML IV BOLUS
INTRAVENOUS | Status: DC | PRN
  Administered 2021-12-20 (×3): 30 mg via INTRAVENOUS

## 2021-12-20 MED ORDER — FUROSEMIDE 10 MG/ML IJ SOLN
20.0000 mg | Freq: Once | INTRAMUSCULAR | Status: AC
  Administered 2021-12-20: 20 mg via INTRAVENOUS
  Filled 2021-12-20: qty 2

## 2021-12-20 MED ORDER — LOSARTAN POTASSIUM 50 MG PO TABS
50.0000 mg | ORAL_TABLET | Freq: Every day | ORAL | Status: DC
  Administered 2021-12-21 – 2021-12-24 (×4): 50 mg via ORAL
  Filled 2021-12-20 (×4): qty 1

## 2021-12-20 MED ORDER — ASPIRIN 81 MG PO TBEC
81.0000 mg | DELAYED_RELEASE_TABLET | Freq: Every day | ORAL | Status: DC
  Administered 2021-12-20 – 2021-12-24 (×5): 81 mg via ORAL
  Filled 2021-12-20 (×5): qty 1

## 2021-12-20 MED ORDER — LACTATED RINGERS IV SOLN: INTRAVENOUS | Status: DC | PRN

## 2021-12-20 MED ORDER — SPIRONOLACTONE 12.5 MG HALF TABLET
12.5000 mg | ORAL_TABLET | Freq: Every day | ORAL | Status: DC
  Administered 2021-12-21 – 2021-12-24 (×4): 12.5 mg via ORAL
  Filled 2021-12-20 (×5): qty 1

## 2021-12-20 MED ORDER — PROPOFOL 500 MG/50ML IV EMUL
INTRAVENOUS | Status: DC | PRN
  Administered 2021-12-20: 150 ug/kg/min via INTRAVENOUS

## 2021-12-20 MED ORDER — POTASSIUM CHLORIDE CRYS ER 20 MEQ PO TBCR
40.0000 meq | EXTENDED_RELEASE_TABLET | Freq: Two times a day (BID) | ORAL | Status: AC
  Administered 2021-12-20: 40 meq via ORAL
  Filled 2021-12-20: qty 2

## 2021-12-20 SURGICAL SUPPLY — 15 items
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
## 2021-12-20 NOTE — Progress Notes (Signed)
PROGRESS NOTE    Greg [name]  IOE:[id number] DOB: [birth year] DOA: 12/18/2021 PCP: Pcp, No    Chief Complaint  Patient presents with   Shortness of Breath    Brief Narrative:  Patient 71 year old gentleman history of iron deficiency anemia, AAA, hypertension presented to ED for evaluation of low hemoglobin.  Patient noted to have been referred to GI for iron deficiency anemia had blood work performed with a hemoglobin of 6.1 and sent to the ED.  Patient does endorse some intermittent shortness of breath, intermittent diffuse chest pain as well as melanotic stools.  Patient seen in the ED afebrile, noted to have sats on room air with stable vitals.  Chest x-ray negative for any cardiopulmonary disease.  Hemoglobin noted at 5.7 and platelet count of 112.  FOBT was positive.  Troponins elevated.  Potassium noted at 3.2.  CT angiogram chest abdomen and pelvis notable for 3.3 x 3.4 cm AAA without dissection, aneurysm of the right iliac artery with small dissection flap, aneurysm of the left common iliac artery, mild dilatation of proximal celiac artery with small dissection flap, emphysema, 4 mm right middle lobe nodule.  It is noted per admitting physician ED discussed case with vascular surgery who reviewed images and recommended outpatient follow-up.  Patient placed on IV Protonix, transfused 1 units RBCs with posttransfusion CBC of approximately 7.7.  Due to elevated troponins, concern for demand ischemia 2 more units of packed red blood cells ordered.  GI consulted.  Cardiology consulted.   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 office with a hemoglobin of 6.1 being evaluated for iron deficiency anemia, patient endorsed some melanotic stools. -Repeat CBC with hemoglobin of 5.7 on admission -Patient status post 3 units packed red blood cells with posttransfusion hemoglobin of 10.0 this morning. -Due to rising troponins, concern for demand ischemia patient transfuse accordingly to keep hemoglobin in the 8-10 range. -Continue Protonix drip. -GI consulted recommending upper endoscopy/EGD which will be done today for further evaluation.  -Per GI colonoscopy deferred at this time as patient does not wish to pursue a colonoscopy and felt to be technically difficult in the setting of large right inguinal hernia which contains a component of small and large bowel. -Follow H&H, transfusion threshold hemoglobin < 8.  2.  Elevated troponin/history of CAD/?  Anterior/septal MI per EKG/probable non-STEMI/probable ischemic cardiomyopathy -Concern for non-STEMI/ischemic cardiomyopathy versus a demand ischemia. -Patient with lower extremity bilateral edema on admission which improved after dose of IV Lasix. -2D echo with EF of 35 to 40%, wall motion abnormalities with severe hypokinesis of the mid to apical anterior septal, inferior septal, anterior walls.  Apical hypokinesis suggesting LAD territory infarction.  Grade 2 diastolic dysfunction.  Normal right ventricular systolic function.  Mild MVR.  -Patient seen initially in consultation by cardiology who feels likely elevated troponin in the setting of profound anemia and due to anemia not a candidate for cath even if 2D echo is abnormal and recommended no aspirin/no heparin given concern  for blood loss anemia, and no further work-up to be considered until GI evaluation is completed and source of blood loss managed. -Patient's lisinopril has been changed to Cozaar and spironolactone  added per cardiology this morning. -Per cardiology at some point will likely need ischemic evaluation once GI bleed has been evaluated and evaluation completed by GI. -Appreciate cardiology input and recommendations.  3.  AAA/?  Flap/bilateral common iliac artery aneurysm -Per admitting physician, ED's note discussed with vascular surgery who recommended outpatient follow-up.  4.  Hypertension -BP currently controlled.   -Lisinopril discontinued and patient started on Cozaar and spironolactone per cardiology.   5.  Hypokalemia -Potassium at 3.4 this morning. -Magnesium 2.2. -K-Dur 40 mEq p.o. x1.  6.  Thrombocytopenia -??  Etiology. -No signs of acute infection, patient denies any alcohol use.  No mention of schistocytes on smear review. -HIV nonreactive. -Vitamin B12 at 370 -Folate at 7.2. -Copper pending. -Thrombocytopenia improving. -Follow-up.  7.  Lung nodule -Admitting physician discussed with patient. -Outpatient follow-up with pulmonary.   DVT prophylaxis: SCDs Code Status: Full Family Communication: Updated patient.  No family at bedside. Disposition: TBD  Status is: Inpatient Remains inpatient appropriate because: Severity of illness   Consultants:  Cardiology: Dr. Harrell Gave 12/19/2021 Gastroenterology: Dr. Tarri Glenn 12/19/2021  Procedures:  CT angiogram chest abdomen and pelvis 12/18/2021 Chest x-ray 12/18/2021 2D echo 12/19/2021 Transfusion 3 units packed red blood cells  Antimicrobials:  None   Subjective: Sitting up in bed.  Still with some shortness of breath.  Denies any ongoing chest pain.  Some complaints of diffuse abdominal pain.  Stated has been using bedside commode did not see what his bowel movements look like yesterday.  No bowel movement today.  Awaiting EGD.    Objective: Vitals:   12/19/21 1512 12/19/21 2100 12/20/21 0042 12/20/21 0419  BP: (!) 160/102 (!) 143/97 137/83 (!) 126/96  Pulse: 68 75 84 80  Resp: '18 20 19 18  '$ Temp: 97.9  F (36.6 C) 97.9 F (36.6 C) 98 F (36.7 C) 98.1 F (36.7 C)  TempSrc: Oral     SpO2: 100% 100% 99% 99%  Weight:    58.8 kg  Height:        Intake/Output Summary (Last 24 hours) at 12/20/2021 0947 Last data filed at 12/20/2021 0745 Gross per 24 hour  Intake 1750.9 ml  Output 5150 ml  Net -3399.1 ml   Filed Weights   12/18/21 1716 12/20/21 0419  Weight: 59.9 kg 58.8 kg    Examination:  General exam: NAD.  Frail. Respiratory system: Lungs clear to auscultation bilaterally.  No wheezes, no crackles, no rhonchi.  Fair air movement.  Normal respiratory effort.  No use of accessory muscles of respiration.   Cardiovascular system: RRR no murmurs rubs or gallops.  No JVD.  Trace bilateral lower extremity edema.  Gastrointestinal system: Abdomen is soft, nontender nondistended, diffuse tenderness to palpation.  No rebound.  No guarding.  Central nervous system: Alert and oriented. No focal neurological deficits. Extremities: Trace bilateral lower extremity edema. Skin: No rashes, lesions or ulcers Psychiatry: Judgement and insight appear fair. Mood & affect appropriate.     Data Reviewed: I have personally reviewed following labs and imaging studies  CBC: Recent Labs  Lab 12/18/21 1125 12/18/21 1920 12/19/21 0731 12/19/21 1509 12/19/21 2305 12/20/21 0707  WBC 4.6 4.5 5.0 5.8 5.6 5.4  NEUTROABS 2.8 R 2.9  --   --   --   --   HGB 6.1 Repeated and verified  X2.* 5.7* 7.7* 11.2* 9.5* 10.0*  HCT 20.5 Repeated and verified X2.* 19.8* 25.4* 35.2* 29.7* 31.6*  MCV 79.0 83.5 84.7 83.4 82.0 82.3  PLT 104.0* 112* 95* 100* 99* 105*    Basic Metabolic Panel: Recent Labs  Lab 12/18/21 1920 12/19/21 0520 12/20/21 0447  NA 141 139 139  K 3.2* 3.3* 3.4*  CL 107 107 105  CO2 '24 24 24  '$ GLUCOSE 93 95 84  BUN '15 12 8  '$ CREATININE 0.84 0.68 0.80  CALCIUM 9.3 8.9 8.8*  MG  --  1.8 2.2    GFR: Estimated Creatinine Clearance: 71.5 mL/min (by C-G formula based on SCr of 0.8  mg/dL).  Liver Function Tests: Recent Labs  Lab 12/18/21 1920  AST 21  ALT 15  ALKPHOS 42  BILITOT 0.4  PROT 7.4  ALBUMIN 4.1    CBG: No results for input(s): "GLUCAP" in the last 168 hours.   No results found for this or any previous visit (from the past 240 hour(s)).       Radiology Studies: ECHOCARDIOGRAM COMPLETE  Result Date: 12/19/2021    ECHOCARDIOGRAM REPORT   Patient Name:   Greg [name] Date of Exam: 12/19/2021 Medical Rec #:  [id number]      Height:       68.0 in Accession #:    [id number]     Weight:       132.0 lb Date of Birth:  03-04-1950      BSA:          1.713 m Patient Age:    57 years       BP:           147/89 mmHg Patient Gender: M              HR:           65 bpm. Exam Location:  Inpatient Procedure: 2D Echo, Cardiac Doppler, Color Doppler and Intracardiac            Opacification Agent Indications:    Elevated Troponin  History:        Patient has no prior history of Echocardiogram examinations.                 Risk Factors:Hypertension. AAA. Acute GI Bleed. Acute on Chronic                 Anemia.  Sonographer:    Darlina Sicilian RDCS Referring Phys: [id number] TIMOTHY S OPYD  Sonographer Comments: Unable to complete exam, patient being moved from the ED to the floor. IMPRESSIONS  1. Left ventricular ejection fraction, by estimation, is 35 to 40%. The left ventricle has moderately decreased function. The left ventricle demonstrates regional wall motion abnormalities with severe hypokinesis of the mid to apical anteroseptal, inferoseptal, and anterior walls. Apical hypokinesis. This suggests LAD territory infarction. Left ventricular diastolic parameters are consistent with Grade II diastolic dysfunction (pseudonormalization).  2. Right ventricular systolic function is normal. The right ventricular size is normal. Tricuspid regurgitation signal is inadequate for assessing PA pressure.  3. The mitral valve is normal in structure. Mild mitral valve regurgitation. No  evidence of mitral stenosis.  4. The aortic valve was not well visualized. Aortic valve regurgitation is not visualized. No aortic stenosis is present.  5. The inferior vena cava is dilated in size with <50% respiratory variability, suggesting right atrial pressure of 15 mmHg. FINDINGS  Left Ventricle: Left ventricular ejection fraction, by estimation, is 35 to 40%. The left  ventricle has moderately decreased function. The left ventricle demonstrates regional wall motion abnormalities. Definity contrast agent was given IV to delineate the left ventricular endocardial borders. The left ventricular internal cavity size was normal in size. There is no left ventricular hypertrophy. Left ventricular diastolic parameters are consistent with Grade II diastolic dysfunction (pseudonormalization). Right Ventricle: The right ventricular size is normal. No increase in right ventricular wall thickness. Right ventricular systolic function is normal. Tricuspid regurgitation signal is inadequate for assessing PA pressure. Left Atrium: Left atrial size was normal in size. Right Atrium: Right atrial size was normal in size. Pericardium: There is no evidence of pericardial effusion. Mitral Valve: The mitral valve is normal in structure. Mild mitral valve regurgitation. No evidence of mitral valve stenosis. Tricuspid Valve: The tricuspid valve is normal in structure. Tricuspid valve regurgitation is trivial. Aortic Valve: The aortic valve was not well visualized. Aortic valve regurgitation is not visualized. No aortic stenosis is present. Pulmonic Valve: The pulmonic valve was not well visualized. Pulmonic valve regurgitation is not visualized. Aorta: The aortic root is normal in size and structure. Venous: The inferior vena cava is dilated in size with less than 50% respiratory variability, suggesting right atrial pressure of 15 mmHg. IAS/Shunts: No atrial level shunt detected by color flow Doppler.  LEFT VENTRICLE PLAX 2D LVOT diam:      1.90 cm      Diastology LV SV:         37           LV e' medial:    4.88 cm/s LV SV Index:   21           LV E/e' medial:  18.9 LVOT Area:     2.84 cm     LV e' lateral:   5.77 cm/s                             LV E/e' lateral: 16.0  LV Volumes (MOD) LV vol d, MOD A2C: 132.0 ml LV vol d, MOD A4C: 134.0 ml LV vol s, MOD A2C: 69.1 ml LV vol s, MOD A4C: 66.3 ml LV SV MOD A2C:     62.9 ml LV SV MOD A4C:     134.0 ml LV SV MOD BP:      67.8 ml RIGHT VENTRICLE RV S prime:     11.90 cm/s TAPSE (M-mode): 2.3 cm LEFT ATRIUM             Index        RIGHT ATRIUM           Index LA Vol (A2C):   30.8 ml 17.98 ml/m  RA Area:     12.30 cm LA Vol (A4C):   38.0 ml 22.18 ml/m  RA Volume:   24.80 ml  14.48 ml/m LA Biplane Vol: 37.4 ml 21.83 ml/m  AORTIC VALVE LVOT Vmax:   68.50 cm/s LVOT Vmean:  43.800 cm/s LVOT VTI:    0.129 m  AORTA Ao Root diam: 3.00 cm MITRAL VALVE MV Area (PHT): 4.24 cm    SHUNTS MV Decel Time: 179 msec    Systemic VTI:  0.13 m MV E velocity: 92.10 cm/s  Systemic Diam: 1.90 cm MV A velocity: 74.10 cm/s MV E/A ratio:  1.24 Dalton McleanMD Electronically signed by Franki Monte Signature Date/Time: 12/19/2021/5:20:05 PM    Final    CT Angio Chest/Abd/Pel for Dissection W and/or Wo Contrast  Result Date:  12/18/2021 CLINICAL DATA:  Acute aortic syndrome suspected. Shortness of breath, known abdominal aortic aneurysm. Concern for dissection. EXAM: CT ANGIOGRAPHY CHEST, ABDOMEN AND PELVIS TECHNIQUE: Non-contrast CT of the chest was initially obtained. Multidetector CT imaging through the chest, abdomen and pelvis was performed using the standard protocol during bolus administration of intravenous contrast. Multiplanar reconstructed images and MIPs were obtained and reviewed to evaluate the vascular anatomy. RADIATION DOSE REDUCTION: This exam was performed according to the departmental dose-optimization program which includes automated exposure control, adjustment of the mA and/or kV according to  patient size and/or use of iterative reconstruction technique. CONTRAST:  166m OMNIPAQUE IOHEXOL 350 MG/ML SOLN COMPARISON:  09/10/2020. FINDINGS: CTA CHEST FINDINGS Cardiovascular: The heart is normal in size and there is no pericardial effusion. Multi-vessel coronary artery calcifications are seen. There is atherosclerotic calcification of the aorta without evidence of aneurysm or dissection. The pulmonary trunk is normal in size. Mediastinum/Nodes: No mediastinal lymphadenopathy. A nonspecific prominent lymph node is present at the right hilum measuring 1 cm. No axillary lymphadenopathy. The thyroid gland, trachea, and esophagus are within normal limits. Lungs/Pleura: Advanced paraseptal and centrilobular emphysematous changes are present in the lungs. Apical pleural scarring is present bilaterally. There is a 4 mm nodule in the right middle lobe, axial image 123. No consolidation, effusion, or pneumothorax. Musculoskeletal: Degenerative changes are present in the thoracic spine. Mild compression deformities are present in the mid thoracic spine which are indeterminate in age. Review of the MIP images confirms the above findings. CTA ABDOMEN AND PELVIS FINDINGS VASCULAR Aorta: Atherosclerotic calcification and mural thrombus of the abdominal aorta with aneurysmal dilatation of the distal abdominal aorta measuring 3.3 x 3.4 cm. The aorta has a tortuous course. No dissection is seen. Celiac: There is aneurysmal dilatation of the mid celiac artery with a small dissection flap. The celiac artery and splenic artery are patent. The hepatic artery originates from the superior mesenteric artery which is a normal variant SMA: Patent without evidence of aneurysm, dissection, vasculitis or significant stenosis. Renals: Both renal arteries are patent without evidence of aneurysm, dissection, vasculitis, fibromuscular dysplasia or significant stenosis. IMA: The visualized portion of the IMA is patent. Inflow: There is  atherosclerotic calcification with mural thrombus and aneurysmal dilatation of the common iliac artery on the right measuring 2.8 cm with a small dissection flap. There is atherosclerotic calcification with mural thrombus with dilatation of the common iliac artery on the left measuring 1.8 cm. No dissection flap is seen. There is atherosclerotic calcification of the external and internal iliac arteries bilaterally with a mural thrombus with no significant stenosis, dissection, or aneurysm. Veins: No obvious venous abnormality within the limitations of this arterial phase study. Review of the MIP images confirms the above findings. NON-VASCULAR Hepatobiliary: Transient hepatic arterial differences are noted. No biliary ductal dilatation. The gallbladder is without stones. Pancreas: Unremarkable. No pancreatic ductal dilatation or surrounding inflammatory changes. Spleen: Normal in size without focal abnormality. Adrenals/Urinary Tract: The adrenal glands are within normal limits. The kidneys enhance symmetrically. No renal calculus or hydronephrosis. Bladder is unremarkable. Stomach/Bowel: There is mild thickening of the walls of the gastric antrum. No bowel obstruction, free air, or pneumatosis. There is a large right inguinal hernia with multiple loops of small and large bowel and mesentery extending into the scrotal sac on the right. The appendix is not visualized on exam. Lymphatic: No abdominal or pelvic lymphadenopathy. Reproductive: The prostate gland is enlarged. Other: No abdominopelvic ascites. Musculoskeletal: Mild degenerative changes in the lumbar spine.  No acute osseous abnormality. Review of the MIP images confirms the above findings. IMPRESSION: 1. Aortic atherosclerosis with aneurysmal dilatation of the distal abdominal aorta measuring 3.3 x 3.4 cm. No dissection is seen. Recommend follow-up every 3 years. 2. Aneurysmal dilatation of the common iliac artery on the right measuring 2.8 cm with a small  dissection flap, increased in size from the prior exam. A vascular surgery consultation is recommended. 3. 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 4. 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 5. Advanced emphysema. 6. 4 mm right middle lobe nodule. No follow-up needed if patient is low-risk.This recommendation follows the consensus statement: Guidelines for Management of Incidental Pulmonary Nodules Detected on CT Images: From the Fleischner Society 2017; Radiology 2017; 284:228-243. 7. Enlarged right inguinal hernia extending into the scrotal sac on the right containing nonobstructed small and large bowel and a portion of the mesentery. Electronically Signed   By: Brett Fairy M.D.   On: 12/18/2021 21:41   DG Chest 2 View  Result Date: 12/18/2021 CLINICAL DATA:  Short of breath EXAM: CHEST - 2 VIEW COMPARISON:  Chest 08/08/2020 FINDINGS: The heart size and mediastinal contours are within normal limits. Both lungs are clear. The visualized skeletal structures are unremarkable. IMPRESSION: No active cardiopulmonary disease. Electronically Signed   By: Franchot Gallo M.D.   On: 12/18/2021 18:15        Scheduled Meds:  furosemide  20 mg Intravenous Once   losartan  50 mg Oral Daily   [START ON 12/22/2021] pantoprazole  40 mg Intravenous Q12H   potassium chloride  40 mEq Oral BID   sodium chloride flush  3 mL Intravenous Q12H   spironolactone  12.5 mg Oral Daily   Continuous Infusions:  sodium chloride     sodium chloride 20 mL/hr at 12/20/21 0053   pantoprazole 8 mg/hr (12/20/21 0228)     LOS: 2 days    Time spent: 40 minutes    Irine Seal, MD [facility]   To contact the attending provider between 7A-7P or the covering provider during after hours 7P-7A, please log into the web site [email or web address] and access using universal [place] password for that web site. If you do not have the password,  please call the hospital operator.  12/20/2021, 9:47 AM

## 2021-12-20 NOTE — Anesthesia Preprocedure Evaluation (Addendum)
Anesthesia Evaluation  Patient identified by MRN, date of birth, ID band Patient awake    Reviewed: Allergy & Precautions, NPO status , Patient's Chart, lab work & pertinent test results  Airway Mallampati: II  TM Distance: >3 FB Neck ROM: Full    Dental  (+) Poor Dentition, Chipped, Missing, Loose   Pulmonary Current Smoker and Patient abstained from smoking.   breath sounds clear to auscultation + decreased breath sounds      Cardiovascular hypertension, + Past MI  Normal cardiovascular exam Rhythm:Regular Rate:Normal  EKG 12/19/21 NSR, LAE, anterior MI   Neuro/Psych negative neurological ROS  negative psych ROS   GI/Hepatic Neg liver ROS,,,FOB+    Endo/Other    Renal/GU negative Renal ROS  negative genitourinary   Musculoskeletal negative musculoskeletal ROS (+)    Abdominal   Peds  Hematology  (+) Blood dyscrasia, anemia Thrombocytopenia   Anesthesia Other Findings   Reproductive/Obstetrics                             Anesthesia Physical Anesthesia Plan  ASA: 3  Anesthesia Plan: MAC   Post-op Pain Management: Minimal or no pain anticipated   Induction: Intravenous  PONV Risk Score and Plan: 1 and Treatment may vary due to age or medical condition and Propofol infusion  Airway Management Planned: Natural Airway, Nasal Cannula and Simple Face Mask  Additional Equipment: None  Intra-op Plan:   Post-operative Plan:   Informed Consent: I have reviewed the patients History and Physical, chart, labs and discussed the procedure including the risks, benefits and alternatives for the proposed anesthesia with the patient or authorized representative who has indicated his/her understanding and acceptance.       Plan Discussed with: Anesthesiologist and CRNA  Anesthesia Plan Comments:         Anesthesia Quick Evaluation

## 2021-12-20 NOTE — TOC Benefit Eligibility Note (Signed)
Transition of Care [facility]) Benefit Eligibility Note    Patient Details  Name: Greg [name] MRN: [id number] Date of Birth: [birth year]      Covered?: No (Patient has no Rx benefit with this policy)        Spoke with Person/Company/Phone Number:: Carrie/ [phone]  Co-Pay: 0        Additional Notes: No Rx benefit with this policy    Kerin Salen Phone Number: 12/20/2021, 9:55 AM

## 2021-12-20 NOTE — Telephone Encounter (Signed)
-----   Message from [place], MD sent at 12/20/2021  2:59 PM EST ----- Recent hospitalization for gastric ulcer.  Please arrange office visit with an APP in 3-4 weeks. EGD with Dr. Rush Landmark in 8 weeks.  KLB

## 2021-12-20 NOTE — TOC Benefit Eligibility Note (Signed)
Transition of Care [facility]) Benefit Eligibility Note    Patient Details  Name: Greg [name] MRN: [id number] Date of Birth: 17-May-1950     Pt has no Rx coverage through insurance.   The following is the out of pocket cost through GoodRx:  Entresto- 708.36 at [facility], 715.38 at [place]- 551.57 at [facility], 552.57 at [facility]- 578.51 at [place], 579.51 at [place]?: No (Patient has no Rx benefit with this policy)        Spoke with Person/Company/Phone Number:: Carrie/ [phone]  Co-Pay: 0        Additional Notes: No Rx benefit with this policy    Vassie Moselle, LCSW Phone Number: 12/20/2021, 10:08 AM

## 2021-12-20 NOTE — Op Note (Addendum)
[facility] Patient Name: Greg [name] Procedure Date: 12/20/2021 MRN: [id number] Attending MD: Thornton Park MD, MD, [id number] Date of Birth: 11/19/1950 CSN: [id number] Age: 71 Admit Type: Inpatient Procedure:                Upper GI endoscopy Indications:              Iron deficiency anemia, Heme positive stool Providers:                Thornton Park MD, MD, Benay Pillow, RN, Cletis Athens, Technician Referring MD:              Medicines:                Monitored Anesthesia Care Complications:            No immediate complications. Estimated Blood Loss:     Estimated blood loss was minimal. Procedure:                Pre-Anesthesia Assessment:                           - Prior to the procedure, a History and Physical                            was performed, and patient medications and                            allergies were reviewed. The patient's tolerance of                            previous anesthesia was also reviewed. The risks                            and benefits of the procedure and the sedation                            options and risks were discussed with the patient.                            All questions were answered, and informed consent                            was obtained. Prior Anticoagulants: The patient has                            taken no anticoagulant or antiplatelet agents. ASA                            Grade Assessment: III - A patient with severe                            systemic disease. After reviewing the risks and  benefits, the patient was deemed in satisfactory                            condition to undergo the procedure.                           After obtaining informed consent, the endoscope was                            passed under direct vision. Throughout the                            procedure, the patient's blood pressure, pulse, and                             oxygen saturations were monitored continuously. The                            [id number]-H190 ([id number]) Olympus endoscope was introduced                            through the mouth, and advanced to the third part                            of duodenum. The upper GI endoscopy was                            accomplished without difficulty. The patient                            tolerated the procedure well. Scope In: Scope Out: Findings:      There were esophageal mucosal changes suspicious for short-segment       Barrett's esophagus present in the distal esophagus at 40 cm. The       maximum longitudinal extent of these mucosal changes was 1-2 cm in       length. This was biopsied with a cold forceps for evaluation to rule out       Barrett's Esophagus. Estimated blood loss was minimal.      A small hiatal hernia was present.      One non-bleeding cratered gastric ulcer with no stigmata of bleeding was       found on the greater curvature of the stomach. The lesion was 6 mm in       largest dimension. Biopsies were taken from the antrum, body, and fundus       with a cold forceps for histology. Estimated blood loss was minimal.      Patchy mild mucosal changes characterized by erythema were found in the       duodenal bulb. Biopsies were taken with a cold forceps for histology.       Estimated blood loss was minimal. Impression:               - Esophageal mucosal changes suspicious for                            short-segment Barrett's esophagus. Biopsied.                           -  Non-bleeding gastric ulcer with no stigmata of                            bleeding. Biopsied.                           - Mucosal changes in the duodenum. Biopsied. Moderate Sedation:      Not Applicable - Patient had care per Anesthesia. Recommendation:           - Resume previous diet.                           - Continue present medications.                           - Continue PPI BID for at  least 10 weeks.                           - Await pathology results.                           - Avoid NSAIDs as able.                           - Repeat upper endoscopy in 8 weeks for                            surveillance. Plan Barrett's biopsies at that time                            if confirmed on esophageal biopsies.                           These results were discussed with the patient's                            sister by phone.                           The inpatient GI team will move to stand-by. Please                            call the on-call gastroenterologist with any                            additional questions or concerns. Procedure Code(s):        --- Professional ---                           [phone], Esophagogastroduodenoscopy, flexible,                            transoral; with biopsy, single or multiple Diagnosis Code(s):        --- Professional ---  K22.89, Other specified disease of esophagus                           K25.9, Gastric ulcer, unspecified as acute or                            chronic, without hemorrhage or perforation                           K31.89, Other diseases of stomach and duodenum                           D50.9, Iron deficiency anemia, unspecified                           R19.5, Other fecal abnormalities CPT copyright 2022 American [employer] Association. All rights reserved. The codes documented in this report are preliminary and upon coder review may  be revised to meet current compliance requirements. Thornton Park MD, MD 12/20/2021 2:53:23 PM This report has been signed electronically. Number of Addenda: 0

## 2021-12-20 NOTE — Transfer of Care (Signed)
Immediate Anesthesia Transfer of Care Note  Patient: Greg [name]  Procedure(s) Performed: ESOPHAGOGASTRODUODENOSCOPY (EGD) WITH PROPOFOL BIOPSY  Patient Location: PACU  Anesthesia Type:MAC  Level of Consciousness: drowsy  Airway & Oxygen Therapy: Patient Spontanous Breathing and Patient connected to face mask  Post-op Assessment: Report given to RN and Post -op Vital signs reviewed and stable  Post vital signs: Reviewed and stable  Last Vitals:  Vitals Value Taken Time  BP    Temp    Pulse    Resp    SpO2      Last Pain:  Vitals:   12/20/21 1349  TempSrc: Temporal  PainSc: 0-No pain         Complications: No notable events documented.

## 2021-12-20 NOTE — Interval H&P Note (Signed)
History and Physical Interval Note:  12/20/2021 1:48 PM  Greg [name]  has presented today for surgery, with the diagnosis of Anemia, + FOBT.  The various methods of treatment have been discussed with the patient and family. After consideration of risks, benefits and other options for treatment, the patient has consented to  Procedure(s): ESOPHAGOGASTRODUODENOSCOPY (EGD) WITH PROPOFOL (N/A) as a surgical intervention.  The patient's history has been reviewed, patient examined, no change in status, stable for surgery.  I have reviewed the patient's chart and labs.  Questions were answered to the patient's satisfaction.     Thornton Park

## 2021-12-20 NOTE — Telephone Encounter (Signed)
Mansouraty, Telford Nab., MD  Thornton Park, MD; Timothy Lasso, RN; Osvaldo Angst, CRNA KB, Sounds like a great plan. Thanks. GM  JN, When you have an opportunity to review this patient's chart, please see if there is now any further contraindication to him undergoing his procedure in the [facility] now that he has been able to have procedures done in the hospital.  Just more for [place] to know where his repeat EGD will need to be done. Thanks. GM

## 2021-12-20 NOTE — Anesthesia Postprocedure Evaluation (Signed)
Anesthesia Post Note  Patient: Greg [name]  Procedure(s) Performed: ESOPHAGOGASTRODUODENOSCOPY (EGD) WITH PROPOFOL BIOPSY     Patient location during evaluation: PACU Anesthesia Type: MAC Level of consciousness: awake and alert and oriented Pain management: pain level controlled Vital Signs Assessment: post-procedure vital signs reviewed and stable Respiratory status: spontaneous breathing, nonlabored ventilation and respiratory function stable Cardiovascular status: stable and blood pressure returned to baseline Postop Assessment: no apparent nausea or vomiting Anesthetic complications: no   No notable events documented.  Last Vitals:  Vitals:   12/20/21 1443 12/20/21 1450  BP: 98/68 125/86  Pulse: 66   Resp: 13   Temp:    SpO2: 100%     Last Pain:  Vitals:   12/20/21 1450  TempSrc:   PainSc: 0-No pain                 Aleem Elza A.

## 2021-12-20 NOTE — Progress Notes (Signed)
  Transition of Care [facility]) Screening Note   Patient Details  Name: Nechemia Chiappetta Date of Birth: 11-02-1950   Transition of Care [facility]) CM/SW Contact:    Vassie Moselle, LCSW Phone Number: 12/20/2021, 10:13 AM    Transition of Care Department [facility]) has reviewed patient and no TOC needs have been identified at this time. We will continue to monitor patient advancement through interdisciplinary progression rounds. If new patient transition needs arise, please place a TOC consult.

## 2021-12-21 DIAGNOSIS — I255 Ischemic cardiomyopathy: Secondary | ICD-10-CM | POA: Diagnosis not present

## 2021-12-21 DIAGNOSIS — K922 Gastrointestinal hemorrhage, unspecified: Secondary | ICD-10-CM | POA: Diagnosis not present

## 2021-12-21 DIAGNOSIS — R7989 Other specified abnormal findings of blood chemistry: Secondary | ICD-10-CM | POA: Diagnosis not present

## 2021-12-21 DIAGNOSIS — K259 Gastric ulcer, unspecified as acute or chronic, without hemorrhage or perforation: Secondary | ICD-10-CM

## 2021-12-21 DIAGNOSIS — I214 Non-ST elevation (NSTEMI) myocardial infarction: Secondary | ICD-10-CM | POA: Diagnosis not present

## 2021-12-21 DIAGNOSIS — D500 Iron deficiency anemia secondary to blood loss (chronic): Secondary | ICD-10-CM | POA: Diagnosis not present

## 2021-12-21 DIAGNOSIS — D649 Anemia, unspecified: Secondary | ICD-10-CM | POA: Diagnosis not present

## 2021-12-21 LAB — CBC WITH DIFFERENTIAL/PLATELET
Abs Immature Granulocytes: 0.03 10*3/uL (ref 0.00–0.07)
Basophils Absolute: 0 10*3/uL (ref 0.0–0.1)
Basophils Relative: 1 %
Eosinophils Absolute: 0.3 10*3/uL (ref 0.0–0.5)
Eosinophils Relative: 4 %
HCT: 31 % — ABNORMAL LOW (ref 39.0–52.0)
Hemoglobin: 9.9 g/dL — ABNORMAL LOW (ref 13.0–17.0)
Immature Granulocytes: 1 %
Lymphocytes Relative: 17 %
Lymphs Abs: 1 10*3/uL (ref 0.7–4.0)
MCH: 26.5 pg (ref 26.0–34.0)
MCHC: 31.9 g/dL (ref 30.0–36.0)
MCV: 83.1 fL (ref 80.0–100.0)
Monocytes Absolute: 0.9 10*3/uL (ref 0.1–1.0)
Monocytes Relative: 15 %
Neutro Abs: 3.7 10*3/uL (ref 1.7–7.7)
Neutrophils Relative %: 62 %
Platelets: 119 10*3/uL — ABNORMAL LOW (ref 150–400)
RBC: 3.73 MIL/uL — ABNORMAL LOW (ref 4.22–5.81)
RDW: 18.1 % — ABNORMAL HIGH (ref 11.5–15.5)
WBC: 5.9 10*3/uL (ref 4.0–10.5)
nRBC: 0 % (ref 0.0–0.2)

## 2021-12-21 LAB — BASIC METABOLIC PANEL
Anion gap: 8 (ref 5–15)
BUN: 19 mg/dL (ref 8–23)
CO2: 26 mmol/L (ref 22–32)
Calcium: 8.8 mg/dL — ABNORMAL LOW (ref 8.9–10.3)
Chloride: 101 mmol/L (ref 98–111)
Creatinine, Ser: 1.02 mg/dL (ref 0.61–1.24)
GFR, Estimated: >60 mL/min (ref >60)
Glucose, Bld: 117 mg/dL — ABNORMAL HIGH (ref 70–99)
Potassium: 3.8 mmol/L (ref 3.5–5.1)
Sodium: 135 mmol/L (ref 135–145)

## 2021-12-21 LAB — LIPID PANEL
Cholesterol: 128 mg/dL (ref 0–200)
HDL: 36 mg/dL — ABNORMAL LOW (ref >40)
LDL Cholesterol: 81 mg/dL (ref 0–99)
Total CHOL/HDL Ratio: 3.6 RATIO
Triglycerides: 55 mg/dL (ref <150)
VLDL: 11 mg/dL (ref 0–40)

## 2021-12-21 MED ORDER — ATORVASTATIN CALCIUM 40 MG PO TABS
40.0000 mg | ORAL_TABLET | Freq: Every day | ORAL | Status: DC
  Administered 2021-12-21 – 2021-12-24 (×4): 40 mg via ORAL
  Filled 2021-12-21 (×4): qty 1

## 2021-12-21 MED ORDER — PANTOPRAZOLE SODIUM 40 MG IV SOLR
40.0000 mg | Freq: Two times a day (BID) | INTRAVENOUS | Status: DC
  Administered 2021-12-21 – 2021-12-23 (×4): 40 mg via INTRAVENOUS
  Filled 2021-12-21 (×4): qty 10

## 2021-12-21 NOTE — Progress Notes (Signed)
Rounding Note    Patient Name: Greg [name] Date of Encounter: 12/21/2021  [place] Cardiologist: Buford Dresser, MD   Subjective   Resting comfortably in bed. Denies any chest or abdominal pain, has nonspecific pain in his feet.  Inpatient Medications    Scheduled Meds:  aspirin EC  81 mg Oral Daily   atorvastatin  40 mg Oral Daily   losartan  50 mg Oral Daily   [START ON 12/22/2021] pantoprazole  40 mg Intravenous Q12H   potassium chloride  40 mEq Oral BID   sodium chloride flush  3 mL Intravenous Q12H   spironolactone  12.5 mg Oral Daily   Continuous Infusions:  sodium chloride     pantoprazole 8 mg/hr (12/20/21 2358)   PRN Meds: acetaminophen **OR** acetaminophen   Vital Signs    Vitals:   12/20/21 1539 12/20/21 2026 12/21/21 0620 12/21/21 0637  BP: (!) 145/99 98/63 123/87   Pulse: 65 86 80   Resp: '18 18 18   '$ Temp: 98 F (36.7 C) 98.2 F (36.8 C) 98 F (36.7 C)   TempSrc: Oral Oral Oral   SpO2: 100% 99% 100%   Weight:    60.5 kg  Height:        Intake/Output Summary (Last 24 hours) at 12/21/2021 0921 Last data filed at 12/20/2021 1730 Gross per 24 hour  Intake 300 ml  Output 1250 ml  Net -950 ml      12/21/2021    6:37 AM 12/20/2021    4:19 AM 12/18/2021    5:16 PM  Last 3 Weights  Weight (lbs) 133 lb 6.1 oz 129 lb 10.1 oz 132 lb  Weight (kg) 60.5 kg 58.8 kg 59.875 kg      Telemetry    Not on telemetry.  ECG    No new ECG tracing today. - Personally Reviewed  Physical Exam   GEN: Well nourished, well developed in no acute distress NECK: No JVD CARDIAC: regular rhythm, normal S1 and S2, no rubs or gallops. No murmur. VASCULAR: Radial pulses 2+ bilaterally.  RESPIRATORY:  Clear to auscultation without rales, wheezing or rhonchi  ABDOMEN: Soft, non-tender, non-distended MUSCULOSKELETAL:  Moves all 4 limbs independently SKIN: Warm and dry, no edema NEUROLOGIC:  No focal neuro deficits noted. PSYCHIATRIC:   Normal affect    Labs    High Sensitivity Troponin:   Recent Labs  Lab 12/18/21 1920 12/18/21 2120 12/19/21 0732  TROPONINIHS 112* 147* 589*     Chemistry Recent Labs  Lab 12/18/21 1920 12/19/21 0520 12/20/21 0447 12/21/21 0453  NA 141 139 139 135  K 3.2* 3.3* 3.4* 3.8  CL 107 107 105 101  CO2 '24 24 24 26  '$ GLUCOSE 93 95 84 117*  BUN '15 12 8 19  '$ CREATININE 0.84 0.68 0.80 1.02  CALCIUM 9.3 8.9 8.8* 8.8*  MG  --  1.8 2.2  --   PROT 7.4  --   --   --   ALBUMIN 4.1  --   --   --   AST 21  --   --   --   ALT 15  --   --   --   ALKPHOS 42  --   --   --   BILITOT 0.4  --   --   --   GFRNONAA >60 >60 >60 >60  ANIONGAP '10 8 10 8    '$ Lipids No results for input(s): "CHOL", "TRIG", "HDL", "LABVLDL", "LDLCALC", "CHOLHDL" in the last 168  hours.  Hematology Recent Labs  Lab 12/20/21 0707 12/20/21 1623 12/21/21 0453  WBC 5.4 6.2 5.9  RBC 3.84* 4.10* 3.73*  HGB 10.0* 11.0* 9.9*  HCT 31.6* 33.9* 31.0*  MCV 82.3 82.7 83.1  MCH 26.0 26.8 26.5  MCHC 31.6 32.4 31.9  RDW 18.2* 18.3* 18.1*  PLT 105* 109* 119*   Thyroid No results for input(s): "TSH", "FREET4" in the last 168 hours.  BNP Recent Labs  Lab 12/20/21 0707  BNP 502.6*    DDimer No results for input(s): "DDIMER" in the last 168 hours.   Radiology    ECHOCARDIOGRAM COMPLETE  Result Date: 12/19/2021    ECHOCARDIOGRAM REPORT   Patient Name:   Greg [name] Date of Exam: 12/19/2021 Medical Rec #:  [id number]      Height:       68.0 in Accession #:    [id number]     Weight:       132.0 lb Date of Birth:  1950/10/14      BSA:          1.713 m Patient Age:    4 years       BP:           147/89 mmHg Patient Gender: M              HR:           65 bpm. Exam Location:  Inpatient Procedure: 2D Echo, Cardiac Doppler, Color Doppler and Intracardiac            Opacification Agent Indications:    Elevated Troponin  History:        Patient has no prior history of Echocardiogram examinations.                 Risk  Factors:Hypertension. AAA. Acute GI Bleed. Acute on Chronic                 Anemia.  Sonographer:    Darlina Sicilian RDCS Referring Phys: [id number] TIMOTHY S OPYD  Sonographer Comments: Unable to complete exam, patient being moved from the ED to the floor. IMPRESSIONS  1. Left ventricular ejection fraction, by estimation, is 35 to 40%. The left ventricle has moderately decreased function. The left ventricle demonstrates regional wall motion abnormalities with severe hypokinesis of the mid to apical anteroseptal, inferoseptal, and anterior walls. Apical hypokinesis. This suggests LAD territory infarction. Left ventricular diastolic parameters are consistent with Grade II diastolic dysfunction (pseudonormalization).  2. Right ventricular systolic function is normal. The right ventricular size is normal. Tricuspid regurgitation signal is inadequate for assessing PA pressure.  3. The mitral valve is normal in structure. Mild mitral valve regurgitation. No evidence of mitral stenosis.  4. The aortic valve was not well visualized. Aortic valve regurgitation is not visualized. No aortic stenosis is present.  5. The inferior vena cava is dilated in size with <50% respiratory variability, suggesting right atrial pressure of 15 mmHg. FINDINGS  Left Ventricle: Left ventricular ejection fraction, by estimation, is 35 to 40%. The left ventricle has moderately decreased function. The left ventricle demonstrates regional wall motion abnormalities. Definity contrast agent was given IV to delineate the left ventricular endocardial borders. The left ventricular internal cavity size was normal in size. There is no left ventricular hypertrophy. Left ventricular diastolic parameters are consistent with Grade II diastolic dysfunction (pseudonormalization). Right Ventricle: The right ventricular size is normal. No increase in right ventricular wall thickness. Right ventricular systolic function is normal. Tricuspid regurgitation signal is  inadequate for assessing PA pressure. Left Atrium: Left atrial size was normal in size. Right Atrium: Right atrial size was normal in size. Pericardium: There is no evidence of pericardial effusion. Mitral Valve: The mitral valve is normal in structure. Mild mitral valve regurgitation. No evidence of mitral valve stenosis. Tricuspid Valve: The tricuspid valve is normal in structure. Tricuspid valve regurgitation is trivial. Aortic Valve: The aortic valve was not well visualized. Aortic valve regurgitation is not visualized. No aortic stenosis is present. Pulmonic Valve: The pulmonic valve was not well visualized. Pulmonic valve regurgitation is not visualized. Aorta: The aortic root is normal in size and structure. Venous: The inferior vena cava is dilated in size with less than 50% respiratory variability, suggesting right atrial pressure of 15 mmHg. IAS/Shunts: No atrial level shunt detected by color flow Doppler.  LEFT VENTRICLE PLAX 2D LVOT diam:     1.90 cm      Diastology LV SV:         37           LV e' medial:    4.88 cm/s LV SV Index:   21           LV E/e' medial:  18.9 LVOT Area:     2.84 cm     LV e' lateral:   5.77 cm/s                             LV E/e' lateral: 16.0  LV Volumes (MOD) LV vol d, MOD A2C: 132.0 ml LV vol d, MOD A4C: 134.0 ml LV vol s, MOD A2C: 69.1 ml LV vol s, MOD A4C: 66.3 ml LV SV MOD A2C:     62.9 ml LV SV MOD A4C:     134.0 ml LV SV MOD BP:      67.8 ml RIGHT VENTRICLE RV S prime:     11.90 cm/s TAPSE (M-mode): 2.3 cm LEFT ATRIUM             Index        RIGHT ATRIUM           Index LA Vol (A2C):   30.8 ml 17.98 ml/m  RA Area:     12.30 cm LA Vol (A4C):   38.0 ml 22.18 ml/m  RA Volume:   24.80 ml  14.48 ml/m LA Biplane Vol: 37.4 ml 21.83 ml/m  AORTIC VALVE LVOT Vmax:   68.50 cm/s LVOT Vmean:  43.800 cm/s LVOT VTI:    0.129 m  AORTA Ao Root diam: 3.00 cm MITRAL VALVE MV Area (PHT): 4.24 cm    SHUNTS MV Decel Time: 179 msec    Systemic VTI:  0.13 m MV E velocity: 92.10 cm/s   Systemic Diam: 1.90 cm MV A velocity: 74.10 cm/s MV E/A ratio:  1.24 Dalton McleanMD Electronically signed by Franki Monte Signature Date/Time: 12/19/2021/5:20:05 PM    Final     Cardiac Studies   Echocardiogram 12/19/2021: Impressions:  1. Left ventricular ejection fraction, by estimation, is 35 to 40%. The  left ventricle has moderately decreased function. The left ventricle  demonstrates regional wall motion abnormalities with severe hypokinesis of  the mid to apical anteroseptal,  inferoseptal, and anterior walls. Apical hypokinesis. This suggests LAD  territory infarction. Left ventricular diastolic parameters are consistent  with Grade II diastolic dysfunction (pseudonormalization).   2. Right ventricular systolic function is normal. The right ventricular  size is normal. Tricuspid regurgitation  signal is inadequate for assessing  PA pressure.   3. The mitral valve is normal in structure. Mild mitral valve  regurgitation. No evidence of mitral stenosis.   4. The aortic valve was not well visualized. Aortic valve regurgitation  is not visualized. No aortic stenosis is present.   5. The inferior vena cava is dilated in size with <50% respiratory  variability, suggesting right atrial pressure of 15 mmHg.   Patient Profile     71 y.o. male with a history of AAA, hypertension, and iron deficiency anemia who was admitted on 12/18/2021 with active GI bleed after being found to have a hemoglobin of 6.1 in outpatient GI office. Cardiology was consulted for further evaluation of elevated troponin at the request of Dr. Grandville Silos.  Assessment & Plan    Elevated Troponin -in the context of severe anemia and presumed GI bleed -Echo showed LVEF of 35-40% with severe hypokinesis of the mid to apical anteroseptal, inferoseptal, anterior walls concerning for LAD territory infarction. Also showed grade 2 diastolic dysfunction and mild MR. - Currently chest pain free. 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 Also showed grade 2 diastolic dysfunction and mild MR.  -I cannot see his prior ECG from [facility] but there was comment re: old anterior infarct on that ECG. That supports that this may not be a new finding. No prior echo or other cardiac studies available. My suspicion is that the prior MI noted in the chart was an LAD infarct, and his severe anemia may be causing worsening perfusion with peri-infarct ischemia. However, it is not safe to pursue cath and potential PCI at this time given his unknown source of blood loss. Once this is found/treated, would then discuss cath for further evaluation of anatomy. -had LE edema on admission, now resolved. Would use lasix PRN now that he is on spironolactone -tolerating ARB, MRA at this time. Beta blocker on hold due to prior low heart rates, but will watch this (based on vitals as he is not on telemetry) -Will have Case Manager run the cost of SGLT2 inhibitor and entresto, these may be limited by cost. -Continue daily weights, strict I/Os, and renal function. -starting statin today, hold on aspirin given GI bleed with downtrending Hgb -added on lipids to labs. Starting at atorvastatin 40 mg daily, increase based on lipid results -He would ideally be on a beta-blocker. However, baseline heart rates were in the low 60s. Not on telemetry. 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 -starting statin today, hold on aspirin given GI  bleed with downtrending Hgb -added on lipids to labs. Starting at atorvastatin 40 mg daily, increase based on lipid results   Hypertension Has been variable but recently well controlled -tolerating Losartan '50mg'$  daily given in hopes of ultimately transitioning to [facility] if cost feasible -tolerating Spironolactone 12.'5mg'$  daily. -He would ideally be on a beta-blocker. However, baseline heart rates were in the low 60s. Not on telemetry. Monitor HR on vitals, add BB if heart rate allows   Acute GI Bleed Acute  on Chronic Anemia Patient was admitted for acute GI bleed after being found to have hemoglobin of 6.1 in outpatient GI office. Hemoglobin on arrival to the ED was 5.7 and hemoccult was positive.  -S/p 3 units of PRBCs this admission.  -EGD 12/20/21 with gastric ulcer but no stigmata of recent bleeding -patient declined colonoscopy, also noted that this may be technically difficult given hernia -Hgb downtrending again today   Otherwise, per primary team. - Pulmonary nodule - Inguinal hernia - Tobacco abuse  Overall this is challenging, as he is a difficult historian and cannot give any information on his cardiac history. However, he is in no distress and denies chest pain. Given this, would prioritize anemia at this time.  Multiple high risk medical conditions, high complexity decision making  For questions or updates, please contact [place] Please consult [email or web address] for contact info under     Signed, Buford Dresser, MD  12/21/2021, 9:21 AM

## 2021-12-21 NOTE — Progress Notes (Signed)
PROGRESS NOTE    Greg [name]  BEE:[id number] DOB: 07-27-1950 DOA: 12/18/2021 PCP: Pcp, No    Chief Complaint  Patient presents with   Shortness of Breath    Brief Narrative:  Patient 71 year old gentleman history of iron deficiency anemia, AAA, hypertension presented to ED for evaluation of low hemoglobin.  Patient noted to have been referred to GI for iron deficiency anemia had blood work performed with a hemoglobin of 6.1 and sent to the ED.  Patient does endorse some intermittent shortness of breath, intermittent diffuse chest pain as well as melanotic stools.  Patient seen in the ED afebrile, noted to have sats on room air with stable vitals.  Chest x-ray negative for any cardiopulmonary disease.  Hemoglobin noted at 5.7 and platelet count of 112.  FOBT was positive.  Troponins elevated.  Potassium noted at 3.2.  CT angiogram chest abdomen and pelvis notable for 3.3 x 3.4 cm AAA without dissection, aneurysm of the right iliac artery with small dissection flap, aneurysm of the left common iliac artery, mild dilatation of proximal celiac artery with small dissection flap, emphysema, 4 mm right middle lobe nodule.  It is noted per admitting physician ED discussed case with vascular surgery who reviewed images and recommended outpatient follow-up.  Patient placed on IV Protonix, transfused 1 units RBCs with posttransfusion CBC of approximately 7.7.  Due to elevated troponins, concern for demand ischemia 2 more units of packed red blood cells ordered.  GI consulted.  Cardiology consulted.   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 -Repeat CBC with hemoglobin of 5.7 on admission -Patient status post 3 units packed red blood cells with posttransfusion hemoglobin of 9.9 this morning which seems to be stabilizing. -Due to rising troponins, concern for demand ischemia patient transfuse accordingly to keep hemoglobin in the 8-10 range. -Was on a Protonix drip and being transitioned to IV PPI twice daily.   -GI consulted recommending upper endoscopy/EGD which was done 12/20/2021 which showed esophageal mucosal changes suspicious for short term Barrett's esophagus which were biopsied, nonbleeding gastric ulcer with no stigmata of bleeding, biopsy, mucosal changes in the duodenum which were biopsied.   -Per GI colonoscopy deferred at this time as patient does not wish to pursue a colonoscopy and felt to be technically difficult in the setting of large right inguinal hernia which contains a component of small and large bowel. -GI recommending PPI twice daily for at least 10 weeks, avoid all NSAIDs, serial H&H and transfuse as needed. -Follow H&H, transfusion threshold hemoglobin < 8.  2.  Elevated troponin/history of CAD/?  Anterior/septal MI per EKG/probable non-STEMI/probable ischemic cardiomyopathy -Concern for non-STEMI/ischemic cardiomyopathy versus a demand ischemia. -Patient with lower extremity bilateral edema on admission which improved after dose of IV Lasix. -2D echo with EF  of 35 to 40%, wall motion abnormalities with severe hypokinesis of the mid to apical anterior septal, inferior septal, anterior walls.  Apical hypokinesis suggesting LAD territory infarction.  Grade 2 diastolic dysfunction.   Normal right ventricular systolic function.  Mild MVR.  -Patient seen initially in consultation by cardiology who feels likely elevated troponin in the setting of profound anemia and due to anemia likely causing the peri-infarct ischemia, not a candidate for cath even if 2D echo is abnormal and recommended no aspirin/no heparin given concern for blood loss anemia, and no further work-up to be considered until GI evaluation is completed and source of blood loss managed. -GI evaluation completed at this time, however cardiology feels not safe at this time to pursue cath and potential PCI. -Patient's lisinopril has been changed to Cozaar and spironolactone added. -Per cardiology due to low heart rates in the 60s unable to start a beta-blocker at this time.  -Per cardiology at some point will likely need ischemic evaluation once GI bleed has been evaluated and evaluation completed by GI. -Appreciate cardiology input and recommendations.  3.  AAA/?  Flap/bilateral common iliac artery aneurysm -Per admitting physician, ED's note discussed with vascular surgery who recommended outpatient follow-up.  4.  Hypertension -BP currently controlled.   -Lisinopril discontinued and patient started on Cozaar and spironolactone per cardiology.  -Per cardiology will ideally be on a beta-blocker but due to heart rates in the 60s we will hold off on beta-blocker at this time per cardiology.  5.  Hypokalemia -Repleted.  Potassium at 3.8. -Magnesium 2.2. -Repeat labs in the AM.  6.  Thrombocytopenia -??  Etiology. -No signs of acute infection, patient denies any alcohol use.  No mention of schistocytes on smear review. -HIV nonreactive. -Vitamin B12 at 370 -Folate at 7.2. -Copper pending. -Thrombocytopenia improving. -Follow-up.  7.  Lung nodule -Admitting physician discussed with patient. -Outpatient follow-up with pulmonary.   DVT prophylaxis: SCDs Code Status: Full Family Communication: Updated  patient.  No family at bedside. Disposition: TBD  Status is: Inpatient Remains inpatient appropriate because: Severity of illness   Consultants:  Cardiology: Dr. Harrell Gave 12/19/2021 Gastroenterology: Dr. Tarri Glenn 12/19/2021  Procedures:  CT angiogram chest abdomen and pelvis 12/18/2021 Chest x-ray 12/18/2021 2D echo 12/19/2021 Transfusion 3 units packed red blood cells Upper endoscopy: 12/20/2021 per Dr. Tarri Glenn  Antimicrobials:  None   Subjective: Laying in bed.  Some improvement with shortness of breath.  Denies any ongoing chest pain at this time.  No abdominal pain.  Denies any melanotic stools.    Objective: Vitals:   12/20/21 1539 12/20/21 2026 12/21/21 0620 12/21/21 0637  BP: (!) 145/99 98/63 123/87   Pulse: 65 86 80   Resp: '18 18 18   '$ Temp: 98 F (36.7 C) 98.2 F (36.8 C) 98 F (36.7 C)   TempSrc: Oral Oral Oral   SpO2: 100% 99% 100%   Weight:    60.5 kg  Height:        Intake/Output Summary (Last 24 hours) at 12/21/2021 1150 Last data filed at 12/21/2021 0940 Gross per 24 hour  Intake 540 ml  Output 1050 ml  Net -510 ml    Filed Weights   12/18/21 1716 12/20/21 0419 12/21/21 0637  Weight: 59.9 kg 58.8 kg 60.5 kg    Examination:  General exam: NAD.  Frail. Respiratory system: CTA B.  No wheezes, no crackles, no rhonchi.  Fair air movement.  Speaking in full sentences.  Cardiovascular system: Regular rate rhythm no murmurs rubs  or gallops.  No JVD.  No lower extremity edema.  Gastrointestinal system: Abdomen soft, nontender, nondistended, positive bowel sounds.  No rebound.  No guarding.  Central nervous system: Alert and oriented. No focal neurological deficits. Extremities: No lower extremity edema.  Skin: No rashes, lesions or ulcers Psychiatry: Judgement and insight appear fair. Mood & affect appropriate.     Data Reviewed: I have personally reviewed following labs and imaging studies  CBC: Recent Labs  Lab 12/18/21 1125 12/18/21 1920  12/19/21 0731 12/19/21 1509 12/19/21 2305 12/20/21 0707 12/20/21 1623 12/21/21 0453  WBC 4.6 4.5   < > 5.8 5.6 5.4 6.2 5.9  NEUTROABS 2.8 R 2.9  --   --   --   --   --  3.7  HGB 6.1 Repeated and verified X2.* 5.7*   < > 11.2* 9.5* 10.0* 11.0* 9.9*  HCT 20.5 Repeated and verified X2.* 19.8*   < > 35.2* 29.7* 31.6* 33.9* 31.0*  MCV 79.0 83.5   < > 83.4 82.0 82.3 82.7 83.1  PLT 104.0* 112*   < > 100* 99* 105* 109* 119*   < > = values in this interval not displayed.     Basic Metabolic Panel: Recent Labs  Lab 12/18/21 1920 12/19/21 0520 12/20/21 0447 12/21/21 0453  NA 141 139 139 135  K 3.2* 3.3* 3.4* 3.8  CL 107 107 105 101  CO2 '24 24 24 26  '$ GLUCOSE 93 95 84 117*  BUN '15 12 8 19  '$ CREATININE 0.84 0.68 0.80 1.02  CALCIUM 9.3 8.9 8.8* 8.8*  MG  --  1.8 2.2  --      GFR: Estimated Creatinine Clearance: 57.7 mL/min (by C-G formula based on SCr of 1.02 mg/dL).  Liver Function Tests: Recent Labs  Lab 12/18/21 1920  AST 21  ALT 15  ALKPHOS 42  BILITOT 0.4  PROT 7.4  ALBUMIN 4.1     CBG: No results for input(s): "GLUCAP" in the last 168 hours.   No results found for this or any previous visit (from the past 240 hour(s)).       Radiology Studies: ECHOCARDIOGRAM COMPLETE  Result Date: 12/19/2021    ECHOCARDIOGRAM REPORT   Patient Name:   LEVANDER KATZENSTEIN Date of Exam: 12/19/2021 Medical Rec #:  [id number]      Height:       68.0 in Accession #:    [id number]     Weight:       132.0 lb Date of Birth:  [birth year]      BSA:          1.713 m Patient Age:    25 years       BP:           147/89 mmHg Patient Gender: M              HR:           65 bpm. Exam Location:  Inpatient Procedure: 2D Echo, Cardiac Doppler, Color Doppler and Intracardiac            Opacification Agent Indications:    Elevated Troponin  History:        Patient has no prior history of Echocardiogram examinations.                 Risk Factors:Hypertension. AAA. Acute GI Bleed. Acute on Chronic                  Anemia.  Sonographer:  Darlina Sicilian RDCS Referring Phys: [id number] TIMOTHY S OPYD  Sonographer Comments: Unable to complete exam, patient being moved from the ED to the floor. IMPRESSIONS  1. Left ventricular ejection fraction, by estimation, is 35 to 40%. The left ventricle has moderately decreased function. The left ventricle demonstrates regional wall motion abnormalities with severe hypokinesis of the mid to apical anteroseptal, inferoseptal, and anterior walls. Apical hypokinesis. This suggests LAD territory infarction. Left ventricular diastolic parameters are consistent with Grade II diastolic dysfunction (pseudonormalization).  2. Right ventricular systolic function is normal. The right ventricular size is normal. Tricuspid regurgitation signal is inadequate for assessing PA pressure.  3. The mitral valve is normal in structure. Mild mitral valve regurgitation. No evidence of mitral stenosis.  4. The aortic valve was not well visualized. Aortic valve regurgitation is not visualized. No aortic stenosis is present.  5. The inferior vena cava is dilated in size with <50% respiratory variability, suggesting right atrial pressure of 15 mmHg. FINDINGS  Left Ventricle: Left ventricular ejection fraction, by estimation, is 35 to 40%. The left ventricle has moderately decreased function. The left ventricle demonstrates regional wall motion abnormalities. Definity contrast agent was given IV to delineate the left ventricular endocardial borders. The left ventricular internal cavity size was normal in size. There is no left ventricular hypertrophy. Left ventricular diastolic parameters are consistent with Grade II diastolic dysfunction (pseudonormalization). Right Ventricle: The right ventricular size is normal. No increase in right ventricular wall thickness. Right ventricular systolic function is normal. Tricuspid regurgitation signal is inadequate for assessing PA pressure. Left Atrium: Left atrial size was  normal in size. Right Atrium: Right atrial size was normal in size. Pericardium: There is no evidence of pericardial effusion. Mitral Valve: The mitral valve is normal in structure. Mild mitral valve regurgitation. No evidence of mitral valve stenosis. Tricuspid Valve: The tricuspid valve is normal in structure. Tricuspid valve regurgitation is trivial. Aortic Valve: The aortic valve was not well visualized. Aortic valve regurgitation is not visualized. No aortic stenosis is present. Pulmonic Valve: The pulmonic valve was not well visualized. Pulmonic valve regurgitation is not visualized. Aorta: The aortic root is normal in size and structure. Venous: The inferior vena cava is dilated in size with less than 50% respiratory variability, suggesting right atrial pressure of 15 mmHg. IAS/Shunts: No atrial level shunt detected by color flow Doppler.  LEFT VENTRICLE PLAX 2D LVOT diam:     1.90 cm      Diastology LV SV:         37           LV e' medial:    4.88 cm/s LV SV Index:   21           LV E/e' medial:  18.9 LVOT Area:     2.84 cm     LV e' lateral:   5.77 cm/s                             LV E/e' lateral: 16.0  LV Volumes (MOD) LV vol d, MOD A2C: 132.0 ml LV vol d, MOD A4C: 134.0 ml LV vol s, MOD A2C: 69.1 ml LV vol s, MOD A4C: 66.3 ml LV SV MOD A2C:     62.9 ml LV SV MOD A4C:     134.0 ml LV SV MOD BP:      67.8 ml RIGHT VENTRICLE RV S prime:     11.90 cm/s TAPSE (M-mode):  2.3 cm LEFT ATRIUM             Index        RIGHT ATRIUM           Index LA Vol (A2C):   30.8 ml 17.98 ml/m  RA Area:     12.30 cm LA Vol (A4C):   38.0 ml 22.18 ml/m  RA Volume:   24.80 ml  14.48 ml/m LA Biplane Vol: 37.4 ml 21.83 ml/m  AORTIC VALVE LVOT Vmax:   68.50 cm/s LVOT Vmean:  43.800 cm/s LVOT VTI:    0.129 m  AORTA Ao Root diam: 3.00 cm MITRAL VALVE MV Area (PHT): 4.24 cm    SHUNTS MV Decel Time: 179 msec    Systemic VTI:  0.13 m MV E velocity: 92.10 cm/s  Systemic Diam: 1.90 cm MV A velocity: 74.10 cm/s MV E/A ratio:  1.24  Dalton McleanMD Electronically signed by Franki Monte Signature Date/Time: 12/19/2021/5:20:05 PM    Final         Scheduled Meds:  aspirin EC  81 mg Oral Daily   atorvastatin  40 mg Oral Daily   losartan  50 mg Oral Daily   [START ON 12/22/2021] pantoprazole  40 mg Intravenous Q12H   sodium chloride flush  3 mL Intravenous Q12H   spironolactone  12.5 mg Oral Daily   Continuous Infusions:  sodium chloride     pantoprazole 8 mg/hr (12/21/21 0949)     LOS: 3 days    Time spent: 40 minutes    Irine Seal, MD [facility]   To contact the attending provider between 7A-7P or the covering provider during after hours 7P-7A, please log into the web site [email or web address] and access using universal [place] password for that web site. If you do not have the password, please call the hospital operator.  12/21/2021, 11:50 AM

## 2021-12-21 NOTE — Progress Notes (Signed)
[facility] Progress Note  CC:  Profound iron deficiency anemia, + FOBT   Subjective:  Feels fine.  Ate lunch.  No BM today.  Objective:  Vital signs in last 24 hours: Temp:  [97.4 F (36.3 C)-98.7 F (37.1 C)] 98 F (36.7 C) (11/10 0620) Pulse Rate:  [65-87] 80 (11/10 0620) Resp:  [12-20] 18 (11/10 0620) BP: (80-170)/(49-103) 123/87 (11/10 0620) SpO2:  [99 %-100 %] 100 % (11/10 0620) Weight:  [60.5 kg] 60.5 kg (11/10 0637) Last BM Date : 12/19/21 General:  Alert, in NAD Heart:  Regular rate and rhythm; no murmurs Pulm:  CTAB.  No W/R/R. Abdomen:  Soft, non-distended.  BS present.  Non-tender. Extremities:  Without edema. Neurologic:  Alert and oriented x 4;  grossly normal neurologically. Psych:  Alert and cooperative. Normal mood and affect.  Intake/Output from previous day: 11/09 0701 - 11/10 0700 In: 300 [I.V.:300] Out: 1700 [Urine:1700] Intake/Output this shift: Total I/O In: 240 [P.O.:240] Out: -   Lab Results: Recent Labs    12/20/21 0707 12/20/21 1623 12/21/21 0453  WBC 5.4 6.2 5.9  HGB 10.0* 11.0* 9.9*  HCT 31.6* 33.9* 31.0*  PLT 105* 109* 119*   BMET Recent Labs    12/19/21 0520 12/20/21 0447 12/21/21 0453  NA 139 139 135  K 3.3* 3.4* 3.8  CL 107 105 101  CO2 '24 24 26  '$ GLUCOSE 95 84 117*  BUN '12 8 19  '$ CREATININE 0.68 0.80 1.02  CALCIUM 8.9 8.8* 8.8*   LFT Recent Labs    12/18/21 1920  PROT 7.4  ALBUMIN 4.1  AST 21  ALT 15  ALKPHOS 42  BILITOT 0.4   Hepatitis Panel Recent Labs    12/19/21 0731  HEPBSAG NON REACTIVE  HCVAB NON REACTIVE  HEPAIGM NON REACTIVE  HEPBIGM NON REACTIVE    ECHOCARDIOGRAM COMPLETE  Result Date: 12/19/2021    ECHOCARDIOGRAM REPORT   Patient Name:   AMJAD FIKES Date of Exam: 12/19/2021 Medical Rec #:  [id number]      Height:       68.0 in Accession #:    [id number]     Weight:       132.0 lb Date of Birth:  [birth year]      BSA:          1.713 m Patient Age:    70 years       BP:            147/89 mmHg Patient Gender: M              HR:           65 bpm. Exam Location:  Inpatient Procedure: 2D Echo, Cardiac Doppler, Color Doppler and Intracardiac            Opacification Agent Indications:    Elevated Troponin  History:        Patient has no prior history of Echocardiogram examinations.                 Risk Factors:Hypertension. AAA. Acute GI Bleed. Acute on Chronic                 Anemia.  Sonographer:    Darlina Sicilian RDCS Referring Phys: [id number] TIMOTHY S OPYD  Sonographer Comments: Unable to complete exam, patient being moved from the ED to the floor. IMPRESSIONS  1. Left ventricular ejection fraction, by estimation, is 35 to 40%. The left ventricle has moderately decreased function. The left  ventricle demonstrates regional wall motion abnormalities with severe hypokinesis of the mid to apical anteroseptal, inferoseptal, and anterior walls. Apical hypokinesis. This suggests LAD territory infarction. Left ventricular diastolic parameters are consistent with Grade II diastolic dysfunction (pseudonormalization).  2. Right ventricular systolic function is normal. The right ventricular size is normal. Tricuspid regurgitation signal is inadequate for assessing PA pressure.  3. The mitral valve is normal in structure. Mild mitral valve regurgitation. No evidence of mitral stenosis.  4. The aortic valve was not well visualized. Aortic valve regurgitation is not visualized. No aortic stenosis is present.  5. The inferior vena cava is dilated in size with <50% respiratory variability, suggesting right atrial pressure of 15 mmHg. FINDINGS  Left Ventricle: Left ventricular ejection fraction, by estimation, is 35 to 40%. The left ventricle has moderately decreased function. The left ventricle demonstrates regional wall motion abnormalities. Definity contrast agent was given IV to delineate the left ventricular endocardial borders. The left ventricular internal cavity size was normal in size. There is no  left ventricular hypertrophy. Left ventricular diastolic parameters are consistent with Grade II diastolic dysfunction (pseudonormalization). Right Ventricle: The right ventricular size is normal. No increase in right ventricular wall thickness. Right ventricular systolic function is normal. Tricuspid regurgitation signal is inadequate for assessing PA pressure. Left Atrium: Left atrial size was normal in size. Right Atrium: Right atrial size was normal in size. Pericardium: There is no evidence of pericardial effusion. Mitral Valve: The mitral valve is normal in structure. Mild mitral valve regurgitation. No evidence of mitral valve stenosis. Tricuspid Valve: The tricuspid valve is normal in structure. Tricuspid valve regurgitation is trivial. Aortic Valve: The aortic valve was not well visualized. Aortic valve regurgitation is not visualized. No aortic stenosis is present. Pulmonic Valve: The pulmonic valve was not well visualized. Pulmonic valve regurgitation is not visualized. Aorta: The aortic root is normal in size and structure. Venous: The inferior vena cava is dilated in size with less than 50% respiratory variability, suggesting right atrial pressure of 15 mmHg. IAS/Shunts: No atrial level shunt detected by color flow Doppler.  LEFT VENTRICLE PLAX 2D LVOT diam:     1.90 cm      Diastology LV SV:         37           LV e' medial:    4.88 cm/s LV SV Index:   21           LV E/e' medial:  18.9 LVOT Area:     2.84 cm     LV e' lateral:   5.77 cm/s                             LV E/e' lateral: 16.0  LV Volumes (MOD) LV vol d, MOD A2C: 132.0 ml LV vol d, MOD A4C: 134.0 ml LV vol s, MOD A2C: 69.1 ml LV vol s, MOD A4C: 66.3 ml LV SV MOD A2C:     62.9 ml LV SV MOD A4C:     134.0 ml LV SV MOD BP:      67.8 ml RIGHT VENTRICLE RV S prime:     11.90 cm/s TAPSE (M-mode): 2.3 cm LEFT ATRIUM             Index        RIGHT ATRIUM           Index LA Vol (A2C):   30.8 ml 17.98 ml/m  RA Area:     12.30 cm LA Vol (A4C):    38.0 ml 22.18 ml/m  RA Volume:   24.80 ml  14.48 ml/m LA Biplane Vol: 37.4 ml 21.83 ml/m  AORTIC VALVE LVOT Vmax:   68.50 cm/s LVOT Vmean:  43.800 cm/s LVOT VTI:    0.129 m  AORTA Ao Root diam: 3.00 cm MITRAL VALVE MV Area (PHT): 4.24 cm    SHUNTS MV Decel Time: 179 msec    Systemic VTI:  0.13 m MV E velocity: 92.10 cm/s  Systemic Diam: 1.90 cm MV A velocity: 74.10 cm/s MV E/A ratio:  1.24 Dalton McleanMD Electronically signed by Franki Monte Signature Date/Time: 12/19/2021/5:20:05 PM    Final     Assessment / Plan: 16) 71 year old male admitted to the hospital with profound iron deficiency anemia reported dark stools with positive FOBT.  Admission hemoglobin 5.7. Transfused 2 units of PRBCs. Post transfusion H/H 7.7.  2 additional units of PRBCs ordered and Hgb is 9.9 grams today (from 11.0 grams yesterday).  No melena/BM since arriving to the ED. Questionable NSAID use.  EGD 11/9:   - Esophageal mucosal changes suspicious for short-segment Barrett's esophagus. Biopsied.   - Non-bleeding gastric ulcer with no stigmata of bleeding. Biopsied. - Mucosal changes in the duodenum. Biopsied.  *Will change PPI to IV BID instead of infusion. *Monitor Hgb and transfuse for Hg < 8 *Colonoscopy deferred at this time as patient does not wish to pursue a colonoscopy and this procedure would potentially be technically difficult in the setting of a large right inguinal hernia which contains a component of the small and large bowel. *All biopsies still pending. *Needs to avoid using NSAIDs.  Tylenol ok.   2) History of coronary artery disease, ? past anterior/septal MI per EKG. Elevated troponin levels, NSTEMI vs demand ischemia in setting of profound anemia.  Echo abnormal. Bilateral LE edema.    3) AAA, bilateral common iliac artery aneurysms *Outpatient vascular surgery evaluation   4) Large right inguinal hernia extending into the scrotal sac, containing nonobstructed small and large bowel.  Patient was  previously evaluated by general surgery and he declined surgical intervention.   5) Chronic tobacco use, 79m pulmonary nodule per CTA   LOS: 3 days   JLaban Emperor Vic Esco  12/21/2021, 12:02 PM

## 2021-12-21 NOTE — Care Management Important Message (Signed)
Important Message  Patient Details IM Letter given to the Patient. Name: Greg [name] MRN: [id number] Date of Birth: [birth year]   Medicare Important Message Given:  Yes     Kerin Salen 12/21/2021, 1:09 PM

## 2021-12-22 DIAGNOSIS — I5021 Acute systolic (congestive) heart failure: Secondary | ICD-10-CM | POA: Diagnosis not present

## 2021-12-22 DIAGNOSIS — D649 Anemia, unspecified: Secondary | ICD-10-CM | POA: Diagnosis not present

## 2021-12-22 DIAGNOSIS — K922 Gastrointestinal hemorrhage, unspecified: Secondary | ICD-10-CM | POA: Diagnosis not present

## 2021-12-22 DIAGNOSIS — I214 Non-ST elevation (NSTEMI) myocardial infarction: Secondary | ICD-10-CM | POA: Diagnosis not present

## 2021-12-22 DIAGNOSIS — I255 Ischemic cardiomyopathy: Secondary | ICD-10-CM | POA: Diagnosis not present

## 2021-12-22 LAB — BPAM RBC
Blood Product Expiration Date: 202311272359
Blood Product Expiration Date: 202311272359
Blood Product Expiration Date: 202311282359
Blood Product Expiration Date: 202311282359
ISSUE DATE / TIME: 202311072117
ISSUE DATE / TIME: 202311080033
ISSUE DATE / TIME: 202311080928
Unit Type and Rh: 7300
Unit Type and Rh: 7300
Unit Type and Rh: 7300
Unit Type and Rh: 7300

## 2021-12-22 LAB — TYPE AND SCREEN
ABO/RH(D): B POS
Antibody Screen: NEGATIVE
Unit division: 0
Unit division: 0
Unit division: 0
Unit division: 0

## 2021-12-22 LAB — CBC
HCT: 31.1 % — ABNORMAL LOW (ref 39.0–52.0)
Hemoglobin: 9.8 g/dL — ABNORMAL LOW (ref 13.0–17.0)
MCH: 26.8 pg (ref 26.0–34.0)
MCHC: 31.5 g/dL (ref 30.0–36.0)
MCV: 85.2 fL (ref 80.0–100.0)
Platelets: 120 10*3/uL — ABNORMAL LOW (ref 150–400)
RBC: 3.65 MIL/uL — ABNORMAL LOW (ref 4.22–5.81)
RDW: 18.4 % — ABNORMAL HIGH (ref 11.5–15.5)
WBC: 6.2 10*3/uL (ref 4.0–10.5)
nRBC: 0 % (ref 0.0–0.2)

## 2021-12-22 LAB — BASIC METABOLIC PANEL
Anion gap: 8 (ref 5–15)
BUN: 17 mg/dL (ref 8–23)
CO2: 26 mmol/L (ref 22–32)
Calcium: 8.9 mg/dL (ref 8.9–10.3)
Chloride: 103 mmol/L (ref 98–111)
Creatinine, Ser: 0.78 mg/dL (ref 0.61–1.24)
GFR, Estimated: >60 mL/min (ref >60)
Glucose, Bld: 102 mg/dL — ABNORMAL HIGH (ref 70–99)
Potassium: 3.9 mmol/L (ref 3.5–5.1)
Sodium: 137 mmol/L (ref 135–145)

## 2021-12-22 LAB — MAGNESIUM: Magnesium: 2.2 mg/dL (ref 1.7–2.4)

## 2021-12-22 MED ORDER — METOPROLOL SUCCINATE ER 25 MG PO TB24
12.5000 mg | ORAL_TABLET | Freq: Every day | ORAL | Status: DC
  Administered 2021-12-22 – 2021-12-23 (×2): 12.5 mg via ORAL
  Filled 2021-12-22 (×2): qty 1

## 2021-12-22 NOTE — Evaluation (Signed)
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  Pt with hx of AAA, htn and anemia  Clinical Impression  Pt admitted as above and presenting with functional mobility limitations 2* generalized weakness, ambulatory balance deficits, limited endurance, questionable safety awareness, and bil foot pain.  This date, pt up to ambulate limited distance in hall and to stand at sink to brush teeth.  Pt very pleasant and cooperative with decreased insight into his current situation including being unaware of bed saturated with a large amount of urine.  Pt would benefit from follow up SNF level rehab to maximize IND and safety prior to return home with very limited assistance.     Recommendations for follow up therapy are one component of a multi-disciplinary discharge planning process, led by the attending physician.  Recommendations may be updated based on patient status, additional functional criteria and insurance authorization.  Follow Up Recommendations Skilled nursing-short term rehab (<3 hours/day) Can patient physically be transported by private vehicle: Yes    Assistance Recommended at Discharge Intermittent Supervision/Assistance  Patient can return home with the following  A little help with walking and/or transfers;A little help with bathing/dressing/bathroom;Assistance with cooking/housework;Assist for transportation;Help with stairs or ramp for entrance    Equipment Recommendations None recommended by PT (Pt states does not want a RW)  Recommendations for Other Services       Functional Status Assessment Patient has had a recent decline in their functional status and demonstrates the ability to make significant improvements in  function in a reasonable and predictable amount of time.     Precautions / Restrictions Precautions Precautions: Fall Restrictions Weight Bearing Restrictions: No      Mobility  Bed Mobility Overal bed mobility: Needs Assistance Bed Mobility: Supine to Sit     Supine to sit: Min assist     General bed mobility comments: Steady assist with use of bed rail    Transfers Overall transfer level: Needs assistance Equipment used: Rolling walker (2 wheels) Transfers: Sit to/from Stand Sit to Stand: Min assist           General transfer comment: cues for use of UEs to self assist with steady assist to bring wt up and fwd and balance in initial standing    Ambulation/Gait Ambulation/Gait assistance: Min guard Gait Distance (Feet): 68 Feet Assistive device: Rolling walker (2 wheels) Gait Pattern/deviations: Step-to pattern, Step-through pattern, Decreased step length - right, Decreased step length - left, Shuffle, Trunk flexed       General Gait Details: cues for posture, position from RW and safety awareness  Stairs            Wheelchair Mobility    Modified Rankin (Stroke Patients Only)       Balance Overall balance assessment: Needs assistance Sitting-balance support: No upper extremity supported, Feet supported Sitting balance-Leahy Scale: Good     Standing balance support: No upper extremity supported Standing balance-Leahy Scale: Fair                               Pertinent Vitals/Pain Pain Assessment Pain Assessment: Faces Faces Pain Scale: Hurts little more Pain Location: bil feet with WB (L>R) Pain Descriptors / Indicators: Aching, Sore Pain Intervention(s):  Limited activity within patient's tolerance, Monitored during session    Home Living Family/patient expects to be discharged to:: Private residence Living Arrangements: Alone   Type of Home: Apartment Home Access: Level entry       Home Layout: One level Home  Equipment: Crutches;Cane - single point Additional Comments: Pt states his case manager gets groceries for him but he has no other assist    Prior Function Prior Level of Function : Independent/Modified Independent             Mobility Comments: Pt report mobilizing at home with single crutch 2* bil foot pain (R>L) 2* 'toenails digging in"       Hand Dominance        Extremity/Trunk Assessment   Upper Extremity Assessment Upper Extremity Assessment: Defer to OT evaluation    Lower Extremity Assessment Lower Extremity Assessment: Generalized weakness       Communication   Communication: No difficulties  Cognition Arousal/Alertness: Awake/alert Behavior During Therapy: WFL for tasks assessed/performed, Flat affect Overall Cognitive Status: No family/caregiver present to determine baseline cognitive functioning                                 General Comments: Delayed responses and cues focus on task at hand        General Comments      Exercises     Assessment/Plan    PT Assessment Patient needs continued PT services  PT Problem List Decreased strength;Decreased activity tolerance;Decreased balance;Decreased mobility;Decreased knowledge of use of DME;Pain;Decreased safety awareness       PT Treatment Interventions DME instruction;Gait training;Functional mobility training;Therapeutic activities;Therapeutic exercise;Patient/family education    PT Goals (Current goals can be found in the Care Plan section)  Acute Rehab PT Goals Patient Stated Goal: Regain IND PT Goal Formulation: With patient Time For Goal Achievement: 01/05/22 Potential to Achieve Goals: Good    Frequency Min 3X/week     Co-evaluation PT/OT/SLP Co-Evaluation/Treatment: Yes Reason for Co-Treatment: To address functional/ADL transfers PT goals addressed during session: Mobility/safety with mobility OT goals addressed during session: ADL's and self-care       AM-PAC  PT "6 Clicks" Mobility  Outcome Measure Help needed turning from your back to your side while in a flat bed without using bedrails?: None Help needed moving from lying on your back to sitting on the side of a flat bed without using bedrails?: A Little Help needed moving to and from a bed to a chair (including a wheelchair)?: A Little Help needed standing up from a chair using your arms (e.g., wheelchair or bedside chair)?: A Little Help needed to walk in hospital room?: A Little Help needed climbing 3-5 steps with a railing? : A Lot 6 Click Score: 18    End of Session Equipment Utilized During Treatment: Gait belt Activity Tolerance: Patient tolerated treatment well Patient left: in chair;with call bell/phone within reach;with chair alarm set Nurse Communication: Mobility status PT Visit Diagnosis: Difficulty in walking, not elsewhere classified (R26.2);Muscle weakness (generalized) (M62.81)    Time: 2505-3976 PT Time Calculation (min) (ACUTE ONLY): 23 min   Charges:   PT Evaluation $PT Eval Low Complexity: [place] Acute Rehabilitation Services Pager [phone] Office [phone]   [facility] 12/22/2021, 4:37 PM

## 2021-12-22 NOTE — Evaluation (Signed)
Occupational Therapy Evaluation Patient Details Name: Greg [name] MRN: [id number] DOB: August 01, 1950 Today's Date: 12/22/2021   History of Present Illness Patient is a 71 year old male who presented to the hospital from GI with low Hgb. Patient was admitted with acute GI bleed,   EGD completed on 11/9 revealed " esophageal mucosal changes suspicious for short segment Barrett's esophagus, non bleeding gastric ulcer, and mucosal changes in duodenum that were biopsied".   PMH: AAA, iron deficiency anemia, HTN,   Clinical Impression   Patient is a 71 year old male who was admitted for above. Patient was living at home alone with case manager support per patient report. Patient was noted to need min A for ADLs and functional mobility with RW. Patient reported not using AD at baseline. Patient was noted to have poor safety awareness, decreased functional activity tolerance, decreased self awareness, and decreased strength impacting participation in ADLs.. Patient would continue to benefit from skilled OT services at this time while admitted and after d/c to address noted deficits in order to improve overall safety and independence in ADLs.          Recommendations for follow up therapy are one component of a multi-disciplinary discharge planning process, led by the attending physician.  Recommendations may be updated based on patient status, additional functional criteria and insurance authorization.   Follow Up Recommendations  Skilled nursing-short term rehab (<3 hours/day)    Assistance Recommended at Discharge Frequent or constant Supervision/Assistance  Patient can return home with the following A little help with walking and/or transfers;Direct supervision/assist for medications management;Assist for transportation;Help with stairs or ramp for entrance;Direct supervision/assist for financial management;A little help with bathing/dressing/bathroom    Functional Status Assessment  Patient has  had a recent decline in their functional status and demonstrates the ability to make significant improvements in function in a reasonable and predictable amount of time.  Equipment Recommendations  None recommended by OT    Recommendations for Other Services       Precautions / Restrictions Precautions Precautions: Fall Restrictions Weight Bearing Restrictions: No      Mobility Bed Mobility Overal bed mobility: Needs Assistance Bed Mobility: Supine to Sit     Supine to sit: Min assist     General bed mobility comments: Steady assist with use of bed rail    Transfers Overall transfer level: Needs assistance Equipment used: Rolling walker (2 wheels) Transfers: Sit to/from Stand Sit to Stand: Min assist           General transfer comment: cues for use of UEs to self assist with steady assist to bring wt up and fwd and balance in initial standing      Balance Overall balance assessment: Needs assistance Sitting-balance support: No upper extremity supported, Feet supported Sitting balance-Leahy Scale: Good     Standing balance support: No upper extremity supported Standing balance-Leahy Scale: Fair                             ADL either performed or assessed with clinical judgement   ADL Overall ADL's : Needs assistance/impaired Eating/Feeding: Set up;Sitting   Grooming: Wash/dry face;Wash/dry hands;Oral care;Standing;Min guard   Upper Body Bathing: Min guard;Sitting   Lower Body Bathing: Moderate assistance;Sit to/from stand;Sitting/lateral leans   Upper Body Dressing : Sitting;Minimal assistance   Lower Body Dressing: Sitting/lateral leans;Moderate assistance Lower Body Dressing Details (indicate cue type and reason): patient noted to not be able to bring  RLE onto lap. patient is able to bring LLE onto lap with increased time. pain in both feet noted. Toilet Transfer: Minimal assistance;Ambulation;Rolling walker (2 wheels);Regular Toilet    Toileting- Clothing Manipulation and Hygiene: Minimal assistance;Sit to/from stand               Vision Baseline Vision/History: 1 Wears glasses       Perception     Praxis      Pertinent Vitals/Pain Pain Assessment Pain Assessment: Faces Faces Pain Scale: Hurts little more Pain Location: bil feet with WB (L>R) Pain Descriptors / Indicators: Aching, Sore Pain Intervention(s): Limited activity within patient's tolerance, Monitored during session     Hand Dominance     Extremity/Trunk Assessment Upper Extremity Assessment Upper Extremity Assessment: Overall WFL for tasks assessed   Lower Extremity Assessment Lower Extremity Assessment: Defer to PT evaluation   Cervical / Trunk Assessment Cervical / Trunk Assessment: Normal   Communication Communication Communication: No difficulties   Cognition Arousal/Alertness: Awake/alert Behavior During Therapy: WFL for tasks assessed/performed, Flat affect Overall Cognitive Status: No family/caregiver present to determine baseline cognitive functioning                                 General Comments: Delayed responses and cues focus on task at hand     General Comments       Exercises     Shoulder Instructions      Home Living Family/patient expects to be discharged to:: Private residence Living Arrangements: Alone   Type of Home: Apartment Home Access: Level entry     Home Layout: One level               Home Equipment: Crutches;Cane - single point   Additional Comments: Pt states his case manager gets groceries for him but he has no other assist      Prior Functioning/Environment Prior Level of Function : Independent/Modified Independent             Mobility Comments: Pt report mobilizing at home with single crutch 2* bil foot pain (R>L) 2* 'toenails digging in"          OT Problem List: Decreased activity tolerance;Impaired balance (sitting and/or standing);Decreased  safety awareness      OT Treatment/Interventions: Self-care/ADL training;Therapeutic activities;Patient/family education;DME and/or AE instruction;Energy conservation;Balance training    OT Goals(Current goals can be found in the care plan section) Acute Rehab OT Goals Patient Stated Goal: to get better OT Goal Formulation: With patient Time For Goal Achievement: 01/05/22 Potential to Achieve Goals: Fair  OT Frequency: Min 2X/week    Co-evaluation PT/OT/SLP Co-Evaluation/Treatment: Yes Reason for Co-Treatment: To address functional/ADL transfers PT goals addressed during session: Mobility/safety with mobility OT goals addressed during session: ADL's and self-care      AM-PAC OT "6 Clicks" Daily Activity     Outcome Measure Help from another person eating meals?: A Little Help from another person taking care of personal grooming?: A Little Help from another person toileting, which includes using toliet, bedpan, or urinal?: A Little Help from another person bathing (including washing, rinsing, drying)?: A Little Help from another person to put on and taking off regular upper body clothing?: A Little Help from another person to put on and taking off regular lower body clothing?: A Little 6 Click Score: 18   End of Session Equipment Utilized During Treatment: Gait belt;Rolling walker (2 wheels) Nurse Communication: Mobility status  Activity Tolerance: Patient tolerated treatment well Patient left: in chair;with call bell/phone within reach;with chair alarm set  OT Visit Diagnosis: Unsteadiness on feet (R26.81);Other abnormalities of gait and mobility (R26.89)                Time: 4210-3128 OT Time Calculation (min): 19 min Charges:  OT General Charges $OT Visit: 1 Visit OT Evaluation $OT Eval Low Complexity: 1 Low  Daneka Lantigua OTR/L, MS Acute Rehabilitation Department Office# [phone]   Marcellina Millin 12/22/2021, 5:08 PM

## 2021-12-22 NOTE — Progress Notes (Signed)
Rounding Note    Patient Name: Greg [name] Date of Encounter: 12/22/2021  [place] Cardiologist: Buford Dresser, MD   Subjective   No chest pain. Hemoglobin decline without over bleeding - he declined GI evaluation. Not a candidate for invasive evaluation of troponin or etiology of new systolic heart failure d/t this. Overall net negative about 4L, not much recorded overnight.   Inpatient Medications    Scheduled Meds:  aspirin EC  81 mg Oral Daily   atorvastatin  40 mg Oral Daily   losartan  50 mg Oral Daily   pantoprazole (PROTONIX) IV  40 mg Intravenous Q12H   sodium chloride flush  3 mL Intravenous Q12H   spironolactone  12.5 mg Oral Daily   Continuous Infusions:  sodium chloride     PRN Meds: acetaminophen **OR** acetaminophen   Vital Signs    Vitals:   12/21/21 1419 12/21/21 1957 12/22/21 0425 12/22/21 0428  BP: 111/77 134/86 123/83   Pulse: 78 87 81   Resp: '18 16 16   '$ Temp: 97.9 F (36.6 C) 97.8 F (36.6 C) 98 F (36.7 C)   TempSrc: Oral Oral Oral   SpO2: 99% 97% 95%   Weight:    61.5 kg  Height:        Intake/Output Summary (Last 24 hours) at 12/22/2021 0915 Last data filed at 12/22/2021 0800 Gross per 24 hour  Intake 720 ml  Output 400 ml  Net 320 ml      12/22/2021    4:28 AM 12/21/2021    6:37 AM 12/20/2021    4:19 AM  Last 3 Weights  Weight (lbs) 135 lb 9.3 oz 133 lb 6.1 oz 129 lb 10.1 oz  Weight (kg) 61.5 kg 60.5 kg 58.8 kg      Telemetry    Sinus rhythm - personally reviewed  ECG    N/A  Physical Exam   GEN: Well nourished, well developed in no acute distress NECK: No JVD CARDIAC: regular rhythm, normal S1 and S2, no rubs or gallops. No murmur. VASCULAR: Radial pulses 2+ bilaterally.  RESPIRATORY:  Clear to auscultation without rales, wheezing or rhonchi  ABDOMEN: Soft, non-tender, non-distended MUSCULOSKELETAL:  Moves all 4 limbs independently SKIN: Warm and dry, no edema NEUROLOGIC:  No focal  neuro deficits noted. PSYCHIATRIC:  Normal affect    Labs    High Sensitivity Troponin:   Recent Labs  Lab 12/18/21 1920 12/18/21 2120 12/19/21 0732  TROPONINIHS 112* 147* 589*     Chemistry Recent Labs  Lab 12/18/21 1920 12/19/21 0520 12/20/21 0447 12/21/21 0453  NA 141 139 139 135  K 3.2* 3.3* 3.4* 3.8  CL 107 107 105 101  CO2 '24 24 24 26  '$ GLUCOSE 93 95 84 117*  BUN '15 12 8 19  '$ CREATININE 0.84 0.68 0.80 1.02  CALCIUM 9.3 8.9 8.8* 8.8*  MG  --  1.8 2.2  --   PROT 7.4  --   --   --   ALBUMIN 4.1  --   --   --   AST 21  --   --   --   ALT 15  --   --   --   ALKPHOS 42  --   --   --   BILITOT 0.4  --   --   --   GFRNONAA >60 >60 >60 >60  ANIONGAP '10 8 10 8    '$ Lipids  Recent Labs  Lab 12/21/21 0453  CHOL 128  TRIG 55  HDL 36*  LDLCALC 81  CHOLHDL 3.6    Hematology Recent Labs  Lab 12/20/21 0707 12/20/21 1623 12/21/21 0453  WBC 5.4 6.2 5.9  RBC 3.84* 4.10* 3.73*  HGB 10.0* 11.0* 9.9*  HCT 31.6* 33.9* 31.0*  MCV 82.3 82.7 83.1  MCH 26.0 26.8 26.5  MCHC 31.6 32.4 31.9  RDW 18.2* 18.3* 18.1*  PLT 105* 109* 119*   Thyroid No results for input(s): "TSH", "FREET4" in the last 168 hours.  BNP Recent Labs  Lab 12/20/21 0707  BNP 502.6*    DDimer No results for input(s): "DDIMER" in the last 168 hours.   Radiology    No results found.  Cardiac Studies   Echocardiogram 12/19/2021: Impressions:  1. Left ventricular ejection fraction, by estimation, is 35 to 40%. The  left ventricle has moderately decreased function. The left ventricle  demonstrates regional wall motion abnormalities with severe hypokinesis of  the mid to apical anteroseptal,  inferoseptal, and anterior walls. Apical hypokinesis. This suggests LAD  territory infarction. Left ventricular diastolic parameters are consistent  with Grade II diastolic dysfunction (pseudonormalization).   2. Right ventricular systolic function is normal. The right ventricular  size is normal.  Tricuspid regurgitation signal is inadequate for assessing  PA pressure.   3. The mitral valve is normal in structure. Mild mitral valve  regurgitation. No evidence of mitral stenosis.   4. The aortic valve was not well visualized. Aortic valve regurgitation  is not visualized. No aortic stenosis is present.   5. The inferior vena cava is dilated in size with <50% respiratory  variability, suggesting right atrial pressure of 15 mmHg.   Patient Profile     71 y.o. male with a history of AAA, hypertension, and iron deficiency anemia who was admitted on 12/18/2021 with active GI bleed after being found to have a hemoglobin of 6.1 in outpatient GI office. Cardiology was consulted for further evaluation of elevated troponin at the request of Dr. Grandville Silos.  Assessment & Plan    Elevated Troponin -in the context of severe anemia and presumed GI bleed -Echo showed LVEF of 35-40% with severe hypokinesis of the mid to apical anteroseptal, inferoseptal, anterior walls concerning for LAD territory infarction. Also showed grade 2 diastolic dysfunction and mild MR. - Currently chest pain free. 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 Also showed grade 2 diastolic dysfunction and mild MR.  -I cannot see his prior ECG from [facility] but there was comment re: old anterior infarct on that ECG. That supports that this may not be a new finding. No prior echo or other cardiac studies available. My suspicion is that the prior MI noted in the chart was an LAD infarct, and his severe  anemia may be causing worsening perfusion with peri-infarct ischemia. However, it is not safe to pursue cath and potential PCI at this time given his unknown source of blood loss. Once this is found/treated, would then discuss cath for further evaluation of anatomy. -had LE edema on admission, now resolved. Would use lasix PRN now that he is on spironolactone -tolerating ARB, MRA  at this time. Beta blocker on hold due to prior low heart rates, but will watch this (based on vitals as he is not on telemetry) -Will have Case Manager run the cost of SGLT2 inhibitor and entresto, these may be limited by cost. -Continue daily weights, strict I/Os, and renal function. -starting statin today, hold on aspirin given GI bleed with downtrending Hgb -added on lipids to labs. Starting at atorvastatin 40 mg daily, increase based on lipid results -He would ideally be on a beta-blocker. However, baseline heart rates were in the low 60s. Not on telemetry. 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 -starting statin today, hold on aspirin given GI bleed with downtrending Hgb -added on lipids to labs. Starting at atorvastatin 40 mg daily, increase based on lipid results   Hypertension Has been variable but recently well controlled -tolerating Losartan '50mg'$  daily given in hopes of ultimately transitioning to [facility] if cost feasible -tolerating Spironolactone 12.'5mg'$  daily. -Start Toprol XL 12.5 mg daily   Acute GI Bleed Acute on Chronic Anemia Patient was admitted for acute GI bleed after being found to have hemoglobin of 6.1 in outpatient GI office. Hemoglobin on arrival to the ED was 5.7 and hemoccult was positive.  -S/p 3 units of PRBCs this admission.  -EGD 12/20/21 with gastric ulcer but no stigmata of recent bleeding -patient declined colonoscopy, also noted that this may be technically  difficult given hernia -Hgb downtrending again today - now 9.9   Otherwise, per primary team. - Pulmonary nodule - Inguinal hernia - Tobacco abuse   For questions or updates, please contact [place] Please consult [email or web address] for contact info under     Pixie Casino, MD, FACC, [place] Director of the [facility] Diplomate of the American Board of Clinical Lipidology Attending Cardiologist  Direct Dial: [phone]  Fax: [phone]  Website:  [email or web address]  Pixie Casino, MD  12/22/2021, 9:15 AM

## 2021-12-22 NOTE — Progress Notes (Signed)
PROGRESS NOTE    Greg [name]  FOY:[id number] DOB: 1950-04-02 DOA: 12/18/2021 PCP: Pcp, No    Chief Complaint  Patient presents with   Shortness of Breath    Brief Narrative:  Patient 71 year old gentleman history of iron deficiency anemia, AAA, hypertension presented to ED for evaluation of low hemoglobin.  Patient noted to have been referred to GI for iron deficiency anemia had blood work performed with a hemoglobin of 6.1 and sent to the ED.  Patient does endorse some intermittent shortness of breath, intermittent diffuse chest pain as well as melanotic stools.  Patient seen in the ED afebrile, noted to have sats on room air with stable vitals.  Chest x-ray negative for any cardiopulmonary disease.  Hemoglobin noted at 5.7 and platelet count of 112.  FOBT was positive.  Troponins elevated.  Potassium noted at 3.2.  CT angiogram chest abdomen and pelvis notable for 3.3 x 3.4 cm AAA without dissection, aneurysm of the right iliac artery with small dissection flap, aneurysm of the left common iliac artery, mild dilatation of proximal celiac artery with small dissection flap, emphysema, 4 mm right middle lobe nodule.  It is noted per admitting physician ED discussed case with vascular surgery who reviewed images and recommended outpatient follow-up.  Patient placed on IV Protonix, transfused 1 units RBCs with posttransfusion CBC of approximately 7.7.  Due to elevated troponins, concern for demand ischemia 2 more units of packed red blood cells ordered.  GI consulted.  Cardiology consulted.   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 -Repeat CBC with hemoglobin of 5.7 on admission -Patient status post 3 units packed red blood cells with posttransfusion hemoglobin of 9.8 this morning which seems to be stabilizing. -Due to rising troponins, concern for demand ischemia patient transfuse accordingly to keep hemoglobin in the 8-10 range. -Was on a Protonix drip and being transitioned to IV PPI twice daily.   -GI consulted recommending upper endoscopy/EGD which was done 12/20/2021 which showed esophageal mucosal changes suspicious for short term Barrett's esophagus which were biopsied, nonbleeding gastric ulcer with no stigmata of bleeding, biopsy, mucosal changes in the duodenum which were biopsied.   -Per GI colonoscopy deferred at this time as patient does not wish to pursue a colonoscopy and felt to be technically difficult in the setting of large right inguinal hernia which contains a component of small and large bowel. -GI recommending PPI twice daily for at least 10 weeks, avoid all NSAIDs, serial H&H and transfuse as needed. -Follow H&H, transfusion threshold hemoglobin < 8.  2.  Elevated troponin/history of CAD/?  Anterior/septal MI per EKG/probable non-STEMI/probable ischemic cardiomyopathy -Concern for non-STEMI/ischemic cardiomyopathy versus a demand ischemia. -Patient with lower extremity bilateral edema on admission which improved after dose of IV Lasix. -2D echo with EF  of 35 to 40%, wall motion abnormalities with severe hypokinesis of the mid to apical anterior septal, inferior septal, anterior walls.  Apical hypokinesis suggesting LAD territory infarction.  Grade 2 diastolic dysfunction.   Normal right ventricular systolic function.  Mild MVR.  -Patient seen initially in consultation by cardiology who feels likely elevated troponin in the setting of profound anemia and due to anemia likely causing the peri-infarct ischemia, not a candidate for cath even if 2D echo is abnormal and recommended no aspirin/no heparin given concern for blood loss anemia, and no further work-up to be considered until GI evaluation is completed and source of blood loss managed. -GI evaluation completed at this time, however cardiology feels not safe at this time to pursue cath and potential PCI. -Patient's lisinopril has been changed to Cozaar and spironolactone added.  Patient started on statin. -Per cardiology due to low heart rates in the 60s unable to start a beta-blocker. -Heart rate improved and patient started on Toprol-XL today per cardiology. -Per cardiology at some point will likely need ischemic evaluation once GI bleed has been evaluated and evaluation completed by GI. -Appreciate cardiology input and recommendations.  3.  AAA/?  Flap/bilateral common iliac artery aneurysm -Per admitting physician, ED's note discussed with vascular surgery who recommended outpatient follow-up.  4.  Hypertension -BP currently controlled.   -Lisinopril discontinued and patient started on Cozaar and spironolactone per cardiology.  -Patient started on Toprol-XL per cardiology.  5.  Hypokalemia -Repleted.  Potassium at 3.9. -Magnesium 2.2. -Repeat labs in the AM.  6.  Thrombocytopenia -??  Etiology. -No signs of acute infection, patient denies any alcohol use.  No mention of schistocytes on smear review. -HIV nonreactive. -Vitamin B12 at 370 -Folate at 7.2. -Copper pending. -Thrombocytopenia improving. -Follow-up.  7.  Lung nodule -Admitting physician discussed with patient. -Outpatient follow-up with pulmonary.   DVT prophylaxis: SCDs Code Status: Full Family Communication: Updated patient.  No  family at bedside. Disposition: TBD  Status is: Inpatient Remains inpatient appropriate because: Severity of illness   Consultants:  Cardiology: Dr. Harrell Gave 12/19/2021 Gastroenterology: Dr. Tarri Glenn 12/19/2021  Procedures:  CT angiogram chest abdomen and pelvis 12/18/2021 Chest x-ray 12/18/2021 2D echo 12/19/2021 Transfusion 3 units packed red blood cells Upper endoscopy: 12/20/2021 per Dr. Tarri Glenn  Antimicrobials:  None   Subjective: Sitting up in bed.  Denies any shortness of breath.  No chest pain.  Denies any further melanotic stools.  Tolerating current diet.  Overall feeling better.   Objective: Vitals:   12/21/21 1419 12/21/21 1957 12/22/21 0425 12/22/21 0428  BP: 111/77 134/86 123/83   Pulse: 78 87 81   Resp: '18 16 16   '$ Temp: 97.9 F (36.6 C) 97.8 F (36.6 C) 98 F (36.7 C)   TempSrc: Oral Oral Oral   SpO2: 99% 97% 95%   Weight:    61.5 kg  Height:        Intake/Output Summary (Last 24 hours) at 12/22/2021 1149 Last data filed at 12/22/2021 1025 Gross per 24 hour  Intake 480 ml  Output 750 ml  Net -270 ml    Filed Weights   12/20/21 0419 12/21/21 0637 12/22/21 0428  Weight: 58.8 kg 60.5 kg 61.5 kg    Examination:  General exam: Frail.  NAD. Respiratory system: Lungs clear to auscultation bilaterally.  No wheezes, no crackles, no rhonchi.  Fair air movement.  Speaking in full sentences.   Cardiovascular system: RRR no murmurs rubs or gallops.  No JVD.  No lower extremity  edema.   Gastrointestinal system: Abdomen is soft, nontender, nondistended, positive bowel sounds.  No rebound.  No guarding.  Central nervous system: Alert and oriented. No focal neurological deficits. Extremities: No lower extremity edema.  Skin: No rashes, lesions or ulcers Psychiatry: Judgement and insight appear fair. Mood & affect appropriate.     Data Reviewed: I have personally reviewed following labs and imaging studies  CBC: Recent Labs  Lab 12/18/21 1125  12/18/21 1920 12/19/21 0731 12/19/21 2305 12/20/21 0707 12/20/21 1623 12/21/21 0453 12/22/21 0855  WBC 4.6 4.5   < > 5.6 5.4 6.2 5.9 6.2  NEUTROABS 2.8 R 2.9  --   --   --   --  3.7  --   HGB 6.1 Repeated and verified X2.* 5.7*   < > 9.5* 10.0* 11.0* 9.9* 9.8*  HCT 20.5 Repeated and verified X2.* 19.8*   < > 29.7* 31.6* 33.9* 31.0* 31.1*  MCV 79.0 83.5   < > 82.0 82.3 82.7 83.1 85.2  PLT 104.0* 112*   < > 99* 105* 109* 119* 120*   < > = values in this interval not displayed.     Basic Metabolic Panel: Recent Labs  Lab 12/18/21 1920 12/19/21 0520 12/20/21 0447 12/21/21 0453  NA 141 139 139 135  K 3.2* 3.3* 3.4* 3.8  CL 107 107 105 101  CO2 '24 24 24 26  '$ GLUCOSE 93 95 84 117*  BUN '15 12 8 19  '$ CREATININE 0.84 0.68 0.80 1.02  CALCIUM 9.3 8.9 8.8* 8.8*  MG  --  1.8 2.2  --      GFR: Estimated Creatinine Clearance: 58.6 mL/min (by C-G formula based on SCr of 1.02 mg/dL).  Liver Function Tests: Recent Labs  Lab 12/18/21 1920  AST 21  ALT 15  ALKPHOS 42  BILITOT 0.4  PROT 7.4  ALBUMIN 4.1     CBG: No results for input(s): "GLUCAP" in the last 168 hours.   No results found for this or any previous visit (from the past 240 hour(s)).       Radiology Studies: No results found.      Scheduled Meds:  aspirin EC  81 mg Oral Daily   atorvastatin  40 mg Oral Daily   losartan  50 mg Oral Daily   metoprolol succinate  12.5 mg Oral Daily   pantoprazole (PROTONIX) IV  40 mg Intravenous Q12H   sodium chloride flush  3 mL Intravenous Q12H   spironolactone  12.5 mg Oral Daily   Continuous Infusions:  sodium chloride       LOS: 4 days    Time spent: 40 minutes    Irine Seal, MD [facility]   To contact the attending provider between 7A-7P or the covering provider during after hours 7P-7A, please log into the web site [email or web address] and access using universal [place] password for that web site. If you do not have the password, please  call the hospital operator.  12/22/2021, 11:49 AM

## 2021-12-23 ENCOUNTER — Encounter (HOSPITAL_COMMUNITY): Payer: Self-pay | Admitting: Gastroenterology

## 2021-12-23 DIAGNOSIS — K253 Acute gastric ulcer without hemorrhage or perforation: Secondary | ICD-10-CM

## 2021-12-23 DIAGNOSIS — I255 Ischemic cardiomyopathy: Secondary | ICD-10-CM | POA: Diagnosis not present

## 2021-12-23 DIAGNOSIS — I5021 Acute systolic (congestive) heart failure: Secondary | ICD-10-CM | POA: Diagnosis not present

## 2021-12-23 DIAGNOSIS — I714 Abdominal aortic aneurysm, without rupture, unspecified: Secondary | ICD-10-CM | POA: Diagnosis not present

## 2021-12-23 DIAGNOSIS — K922 Gastrointestinal hemorrhage, unspecified: Secondary | ICD-10-CM | POA: Diagnosis not present

## 2021-12-23 DIAGNOSIS — I214 Non-ST elevation (NSTEMI) myocardial infarction: Secondary | ICD-10-CM | POA: Diagnosis not present

## 2021-12-23 LAB — BASIC METABOLIC PANEL
Anion gap: 8 (ref 5–15)
BUN: 17 mg/dL (ref 8–23)
CO2: 25 mmol/L (ref 22–32)
Calcium: 9 mg/dL (ref 8.9–10.3)
Chloride: 103 mmol/L (ref 98–111)
Creatinine, Ser: 0.73 mg/dL (ref 0.61–1.24)
GFR, Estimated: >60 mL/min (ref >60)
Glucose, Bld: 97 mg/dL (ref 70–99)
Potassium: 4.1 mmol/L (ref 3.5–5.1)
Sodium: 136 mmol/L (ref 135–145)

## 2021-12-23 LAB — CBC
HCT: 32.3 % — ABNORMAL LOW (ref 39.0–52.0)
Hemoglobin: 9.7 g/dL — ABNORMAL LOW (ref 13.0–17.0)
MCH: 26.3 pg (ref 26.0–34.0)
MCHC: 30 g/dL (ref 30.0–36.0)
MCV: 87.5 fL (ref 80.0–100.0)
Platelets: 116 10*3/uL — ABNORMAL LOW (ref 150–400)
RBC: 3.69 MIL/uL — ABNORMAL LOW (ref 4.22–5.81)
RDW: 18.8 % — ABNORMAL HIGH (ref 11.5–15.5)
WBC: 5.5 10*3/uL (ref 4.0–10.5)
nRBC: 0 % (ref 0.0–0.2)

## 2021-12-23 LAB — MAGNESIUM: Magnesium: 2.2 mg/dL (ref 1.7–2.4)

## 2021-12-23 MED ORDER — PANTOPRAZOLE SODIUM 40 MG PO TBEC
40.0000 mg | DELAYED_RELEASE_TABLET | Freq: Two times a day (BID) | ORAL | Status: DC
  Administered 2021-12-23 – 2021-12-24 (×2): 40 mg via ORAL
  Filled 2021-12-23 (×2): qty 1

## 2021-12-23 NOTE — Social Work (Signed)
CSW spoke to the patient's sister, she is wanting HH, CSW set up home health with [facility]. DC is still undetermined.

## 2021-12-23 NOTE — Progress Notes (Signed)
PROGRESS NOTE    Greg [name]  IRC:[id number] DOB: Jun 19, 1950 DOA: 12/18/2021 PCP: Pcp, No    Chief Complaint  Patient presents with   Shortness of Breath    Brief Narrative:  Patient 71 year old gentleman history of iron deficiency anemia, AAA, hypertension presented to ED for evaluation of low hemoglobin.  Patient noted to have been referred to GI for iron deficiency anemia had blood work performed with a hemoglobin of 6.1 and sent to the ED.  Patient does endorse some intermittent shortness of breath, intermittent diffuse chest pain as well as melanotic stools.  Patient seen in the ED afebrile, noted to have sats on room air with stable vitals.  Chest x-ray negative for any cardiopulmonary disease.  Hemoglobin noted at 5.7 and platelet count of 112.  FOBT was positive.  Troponins elevated.  Potassium noted at 3.2.  CT angiogram chest abdomen and pelvis notable for 3.3 x 3.4 cm AAA without dissection, aneurysm of the right iliac artery with small dissection flap, aneurysm of the left common iliac artery, mild dilatation of proximal celiac artery with small dissection flap, emphysema, 4 mm right middle lobe nodule.  It is noted per admitting physician ED discussed case with vascular surgery who reviewed images and recommended outpatient follow-up.  Patient placed on IV Protonix, transfused 1 units RBCs with posttransfusion CBC of approximately 7.7.  Due to elevated troponins, concern for demand ischemia 2 more units of packed red blood cells ordered.  GI consulted.  Cardiology consulted.   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 -Repeat CBC with hemoglobin of 5.7 on admission -Patient status post 3 units packed red blood cells with posttransfusion hemoglobin of 9.7 this morning which seems to be stabilizing. -Due to rising troponins, concern for demand ischemia patient transfuse accordingly to keep hemoglobin in the 8-10 range. -Was on a Protonix drip and being transitioned to IV PPI twice daily.   -GI consulted recommending upper endoscopy/EGD which was done 12/20/2021 which showed esophageal mucosal changes suspicious for short term Barrett's esophagus which were biopsied, nonbleeding gastric ulcer with no stigmata of bleeding, biopsy, mucosal changes in the duodenum which were biopsied.   -Per GI colonoscopy deferred at this time as patient does not wish to pursue a colonoscopy and felt to be technically difficult in the setting of large right inguinal hernia which contains a component of small and large bowel. -GI recommending PPI twice daily for at least 10 weeks, avoid all NSAIDs, serial H&H and transfuse as needed. -GI okay with starting patient on aspirin. -Follow H&H, transfusion threshold hemoglobin < 8.  2.  Elevated troponin/history of CAD/?  Anterior/septal MI per EKG/probable non-STEMI/probable ischemic cardiomyopathy -Concern for non-STEMI/ischemic cardiomyopathy versus a demand ischemia. -Patient with lower extremity bilateral edema on admission which improved after dose  of IV Lasix. -2D echo with EF of 35 to 40%, wall motion abnormalities with severe hypokinesis of the mid to apical anterior septal, inferior septal, anterior walls.  Apical hypokinesis suggesting LAD territory  infarction.  Grade 2 diastolic dysfunction.  Normal right ventricular systolic function.  Mild MVR.  -Patient seen initially in consultation by cardiology who feels likely elevated troponin in the setting of profound anemia and due to anemia likely causing the peri-infarct ischemia, not a candidate for cath even if 2D echo is abnormal and recommended no aspirin/no heparin given concern for blood loss anemia, and no further work-up to be considered until GI evaluation is completed and source of blood loss managed. -GI evaluation completed at this time, however cardiology feels not safe at this time to pursue cath and potential PCI. -Patient's lisinopril has been changed to Cozaar and spironolactone added.  Patient started on statin. -Per cardiology due to low heart rates in the 60s unable to start a beta-blocker early on during the hospitalization. -Heart rate improved and patient started on Toprol-XL per cardiology-due to nocturnal bradycardia Toprol-XL discontinued per cardiology. -Per cardiology at some point will likely need ischemic evaluation once GI bleed has been evaluated and evaluation completed by GI. -GI okay with starting patient on aspirin. -Patient seen by cardiology today and per cardiology note patient declining cardiac cath. -Appreciate cardiology input and recommendations.  3.  AAA/?  Flap/bilateral common iliac artery aneurysm -Per admitting physician, ED's note discussed with vascular surgery who recommended outpatient follow-up.  4.  Hypertension -BP currently controlled.   -Lisinopril discontinued and patient started on Cozaar and spironolactone per cardiology.  -Patient started on Toprol-XL per cardiology, however due to nocturnal bradycardia beta-blocker has been discontinued per cardiology.  5.  Hypokalemia -Repleted.  Potassium at 4.1. -Magnesium 2.2. -Repeat labs in the AM.  6.  Thrombocytopenia -??  Etiology. -No signs of acute infection, patient denies any  alcohol use.  No mention of schistocytes on smear review. -HIV nonreactive. -Vitamin B12 at 370 -Folate at 7.2. -Copper pending. -Thrombocytopenia improving. -Follow-up.  7.  Lung nodule -Admitting physician discussed with patient. -Outpatient follow-up with pulmonary.  8.  Nocturnal bradycardia/pauses -Noted on telemetry. -Cardiology reviewed telemetry and feel patient has a 2-1 AV block overnight likely secondary to probable undiagnosed OSA. -Will likely need outpatient sleep study.   DVT prophylaxis: SCDs Code Status: Full Family Communication: Updated patient.  No family at bedside. Disposition: TBD  Status is: Inpatient Remains inpatient appropriate because: Severity of illness   Consultants:  Cardiology: Dr. Harrell Gave 12/19/2021 Gastroenterology: Dr. Tarri Glenn 12/19/2021  Procedures:  CT angiogram chest abdomen and pelvis 12/18/2021 Chest x-ray 12/18/2021 2D echo 12/19/2021 Transfusion 3 units packed red blood cells Upper endoscopy: 12/20/2021 per Dr. Tarri Glenn  Antimicrobials:  None   Subjective: Sitting up in bed.  Some intermittent shortness of breath.  No chest pain.  No abdominal pain.  No further melanotic stools.  Seems hesitant to go to a SNF and prefers to go home when ready for discharge.  Patient noted to have bradycardia and pauses overnight per telemetry.  Objective: Vitals:   12/22/21 0428 12/22/21 1239 12/22/21 1957 12/23/21 0505  BP:  (!) 141/88 118/79 (!) 141/98  Pulse:  74 77 84  Resp:  '20 16 18  '$ Temp:  98 F (36.7 C) 98.7 F (37.1 C) 98.6 F (37 C)  TempSrc:  Oral Oral Oral  SpO2:  99% 99% 99%  Weight: 61.5 kg   58.7 kg  Height:  Intake/Output Summary (Last 24 hours) at 12/23/2021 1004 Last data filed at 12/23/2021 0505 Gross per 24 hour  Intake --  Output 1350 ml  Net -1350 ml    Filed Weights   12/21/21 0637 12/22/21 0428 12/23/21 0505  Weight: 60.5 kg 61.5 kg 58.7 kg    Examination:  General exam: NAD.   Frail. Respiratory system: CTA B.  No wheezes, no crackles, no rhonchi.  Fair air movement.  Speaking in full sentences.  Cardiovascular system: Regular rate rhythm no murmurs rubs or gallops.  No JVD.  No lower extremity edema.  Gastrointestinal system: Abdomen is soft, nontender, nondistended, positive bowel sounds.  No rebound.  No guarding.  Central nervous system: Alert and oriented. No focal neurological deficits. Extremities: No lower extremity edema.  Skin: No rashes, lesions or ulcers Psychiatry: Judgement and insight appear fair. Mood & affect appropriate.     Data Reviewed: I have personally reviewed following labs and imaging studies  CBC: Recent Labs  Lab 12/18/21 1125 12/18/21 1920 12/19/21 0731 12/20/21 0707 12/20/21 1623 12/21/21 0453 12/22/21 0855 12/23/21 0545  WBC 4.6 4.5   < > 5.4 6.2 5.9 6.2 5.5  NEUTROABS 2.8 R 2.9  --   --   --  3.7  --   --   HGB 6.1 Repeated and verified X2.* 5.7*   < > 10.0* 11.0* 9.9* 9.8* 9.7*  HCT 20.5 Repeated and verified X2.* 19.8*   < > 31.6* 33.9* 31.0* 31.1* 32.3*  MCV 79.0 83.5   < > 82.3 82.7 83.1 85.2 87.5  PLT 104.0* 112*   < > 105* 109* 119* 120* 116*   < > = values in this interval not displayed.     Basic Metabolic Panel: Recent Labs  Lab 12/19/21 0520 12/20/21 0447 12/21/21 0453 12/22/21 0855 12/23/21 0545  NA 139 139 135 137 136  K 3.3* 3.4* 3.8 3.9 4.1  CL 107 105 101 103 103  CO2 '24 24 26 26 25  '$ GLUCOSE 95 84 117* 102* 97  BUN '12 8 19 17 17  '$ CREATININE 0.68 0.80 1.02 0.78 0.73  CALCIUM 8.9 8.8* 8.8* 8.9 9.0  MG 1.8 2.2  --  2.2 2.2     GFR: Estimated Creatinine Clearance: 71.3 mL/min (by C-G formula based on SCr of 0.73 mg/dL).  Liver Function Tests: Recent Labs  Lab 12/18/21 1920  AST 21  ALT 15  ALKPHOS 42  BILITOT 0.4  PROT 7.4  ALBUMIN 4.1     CBG: No results for input(s): "GLUCAP" in the last 168 hours.   No results found for this or any previous visit (from the past 240  hour(s)).       Radiology Studies: No results found.      Scheduled Meds:  aspirin EC  81 mg Oral Daily   atorvastatin  40 mg Oral Daily   losartan  50 mg Oral Daily   metoprolol succinate  12.5 mg Oral Daily   pantoprazole (PROTONIX) IV  40 mg Intravenous Q12H   sodium chloride flush  3 mL Intravenous Q12H   spironolactone  12.5 mg Oral Daily   Continuous Infusions:  sodium chloride       LOS: 5 days    Time spent: 40 minutes    Irine Seal, MD [facility]   To contact the attending provider between 7A-7P or the covering provider during after hours 7P-7A, please log into the web site [email or web address] and access using universal [place] password for that  web site. If you do not have the password, please call the hospital operator.  12/23/2021, 10:04 AM

## 2021-12-23 NOTE — Progress Notes (Signed)
Rounding Note    Patient Name: Greg [name] Date of Encounter: 12/23/2021  [place] Cardiologist: Buford Dresser, MD   Subjective   No chest pain. Hemoglobin stable. Noted to have bradycardia with pause overnight- short period of 2:1 AV Block - started on BB yesterday. Discussed cardiac cath - he does not want it.   Inpatient Medications    Scheduled Meds:  aspirin EC  81 mg Oral Daily   atorvastatin  40 mg Oral Daily   losartan  50 mg Oral Daily   pantoprazole (PROTONIX) IV  40 mg Intravenous Q12H   sodium chloride flush  3 mL Intravenous Q12H   spironolactone  12.5 mg Oral Daily   Continuous Infusions:  sodium chloride     PRN Meds: acetaminophen **OR** acetaminophen   Vital Signs    Vitals:   12/22/21 0428 12/22/21 1239 12/22/21 1957 12/23/21 0505  BP:  (!) 141/88 118/79 (!) 141/98  Pulse:  74 77 84  Resp:  '20 16 18  '$ Temp:  98 F (36.7 C) 98.7 F (37.1 C) 98.6 F (37 C)  TempSrc:  Oral Oral Oral  SpO2:  99% 99% 99%  Weight: 61.5 kg   58.7 kg  Height:        Intake/Output Summary (Last 24 hours) at 12/23/2021 1021 Last data filed at 12/23/2021 0505 Gross per 24 hour  Intake --  Output 1350 ml  Net -1350 ml      12/23/2021    5:05 AM 12/22/2021    4:28 AM 12/21/2021    6:37 AM  Last 3 Weights  Weight (lbs) 129 lb 6.6 oz 135 lb 9.3 oz 133 lb 6.1 oz  Weight (kg) 58.7 kg 61.5 kg 60.5 kg      Telemetry    Sinus rhythm, noted to have bradycardia, pause, 2:1 AV Block overnight - personally reviewed  ECG    N/A  Physical Exam   GEN: Well nourished, well developed in no acute distress NECK: No JVD CARDIAC: regular rhythm, normal S1 and S2, no rubs or gallops. No murmur. VASCULAR: Radial pulses 2+ bilaterally.  RESPIRATORY:  Clear to auscultation without rales, wheezing or rhonchi  ABDOMEN: Soft, non-tender, non-distended MUSCULOSKELETAL:  Moves all 4 limbs independently SKIN: Warm and dry, no edema NEUROLOGIC:  No  focal neuro deficits noted. PSYCHIATRIC:  Normal affect    Labs    High Sensitivity Troponin:   Recent Labs  Lab 12/18/21 1920 12/18/21 2120 12/19/21 0732  TROPONINIHS 112* 147* 589*     Chemistry Recent Labs  Lab 12/18/21 1920 12/19/21 0520 12/20/21 0447 12/21/21 0453 12/22/21 0855 12/23/21 0545  NA 141   < > 139 135 137 136  K 3.2*   < > 3.4* 3.8 3.9 4.1  CL 107   < > 105 101 103 103  CO2 24   < > '24 26 26 25  '$ GLUCOSE 93   < > 84 117* 102* 97  BUN 15   < > '8 19 17 17  '$ CREATININE 0.84   < > 0.80 1.02 0.78 0.73  CALCIUM 9.3   < > 8.8* 8.8* 8.9 9.0  MG  --    < > 2.2  --  2.2 2.2  PROT 7.4  --   --   --   --   --   ALBUMIN 4.1  --   --   --   --   --   AST 21  --   --   --   --   --  ALT 15  --   --   --   --   --   ALKPHOS 42  --   --   --   --   --   BILITOT 0.4  --   --   --   --   --   GFRNONAA >60   < > >60 >60 >60 >60  ANIONGAP 10   < > '10 8 8 8   '$ < > = values in this interval not displayed.    Lipids  Recent Labs  Lab 12/21/21 0453  CHOL 128  TRIG 55  HDL 36*  LDLCALC 81  CHOLHDL 3.6    Hematology Recent Labs  Lab 12/21/21 0453 12/22/21 0855 12/23/21 0545  WBC 5.9 6.2 5.5  RBC 3.73* 3.65* 3.69*  HGB 9.9* 9.8* 9.7*  HCT 31.0* 31.1* 32.3*  MCV 83.1 85.2 87.5  MCH 26.5 26.8 26.3  MCHC 31.9 31.5 30.0  RDW 18.1* 18.4* 18.8*  PLT 119* 120* 116*   Thyroid No results for input(s): "TSH", "FREET4" in the last 168 hours.  BNP Recent Labs  Lab 12/20/21 0707  BNP 502.6*    DDimer No results for input(s): "DDIMER" in the last 168 hours.   Radiology    No results found.  Cardiac Studies   Echocardiogram 12/19/2021: Impressions:  1. Left ventricular ejection fraction, by estimation, is 35 to 40%. The  left ventricle has moderately decreased function. The left ventricle  demonstrates regional wall motion abnormalities with severe hypokinesis of  the mid to apical anteroseptal,  inferoseptal, and anterior walls. Apical hypokinesis. This  suggests LAD  territory infarction. Left ventricular diastolic parameters are consistent  with Grade II diastolic dysfunction (pseudonormalization).   2. Right ventricular systolic function is normal. The right ventricular  size is normal. Tricuspid regurgitation signal is inadequate for assessing  PA pressure.   3. The mitral valve is normal in structure. Mild mitral valve  regurgitation. No evidence of mitral stenosis.   4. The aortic valve was not well visualized. Aortic valve regurgitation  is not visualized. No aortic stenosis is present.   5. The inferior vena cava is dilated in size with <50% respiratory  variability, suggesting right atrial pressure of 15 mmHg.   Patient Profile     71 y.o. male with a history of AAA, hypertension, and iron deficiency anemia who was admitted on 12/18/2021 with active GI bleed after being found to have a hemoglobin of 6.1 in outpatient GI office. Cardiology was consulted for further evaluation of elevated troponin at the request of Dr. Grandville Silos.  Assessment & Plan    Elevated Troponin -in the context of severe anemia and presumed GI bleed -Echo showed LVEF of 35-40% with severe hypokinesis of the mid to apical anteroseptal, inferoseptal, anterior walls concerning for LAD territory infarction. Also showed grade 2 diastolic dysfunction and mild MR. - Currently chest pain free. 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 Also showed grade 2 diastolic dysfunction and mild MR.  -I cannot see his prior ECG from [facility] but there was comment re: old anterior infarct on that ECG. That supports that this may not be a new finding. No prior echo or other cardiac studies available.  My suspicion is that the prior  MI noted in the chart was an LAD infarct, and his severe anemia may be causing worsening perfusion with peri-infarct ischemia. However, it is not safe to pursue cath and potential PCI at this time given his unknown source of blood loss. Once this is found/treated, would then discuss cath for further evaluation of anatomy. -had LE edema on admission, now resolved. Would use lasix PRN now that he is on spironolactone -tolerating ARB, MRA at this time. Beta blocker on hold due to prior low heart rates, but will watch this (based on vitals as he is not on telemetry) -Will have Case Manager run the cost of SGLT2 inhibitor and entresto, these may be limited by cost. -Continue daily weights, strict I/Os, and renal function. -starting statin today, hold on aspirin given GI bleed with downtrending Hgb -added on lipids to labs. Starting at atorvastatin 40 mg daily, increase based on lipid results -He would ideally be on a beta-blocker. However, baseline heart rates were in the low 60s. Not on telemetry. 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 -starting statin today, hold on aspirin given GI bleed with downtrending Hgb -added on lipids to labs. Starting at atorvastatin 40 mg daily, increase based on lipid results   Hypertension Has been variable but recently well controlled -tolerating Losartan '50mg'$  daily given in hopes of ultimately transitioning to [facility] if cost feasible -tolerating Spironolactone 12.'5mg'$  daily.   Acute GI Bleed Acute on Chronic Anemia Patient was admitted for acute GI bleed after being found to have hemoglobin of 6.1 in outpatient GI office. Hemoglobin on arrival to the ED was 5.7 and hemoccult was positive.  -S/p 3 units of PRBCs this admission.  -EGD 12/20/21 with gastric ulcer but no stigmata of recent bleeding -patient  declined colonoscopy, also noted that this may be technically difficult given hernia -Hgb stable   Nocturnal bradycardia/pause - will hold BB - suspect OSA, monitor for recurrence  Otherwise, per primary team. - Pulmonary nodule - Inguinal hernia - Tobacco abuse   For questions or updates, please contact [place] Please consult [email or web address] for contact info under     Pixie Casino, MD, FACC, [place] Director of the [facility] Diplomate of the American Board of Clinical Lipidology Attending Cardiologist  Direct Dial: [phone]  Fax: [phone]  Website:  [email or web address]  Pixie Casino, MD  12/23/2021, 10:21 AM

## 2021-12-24 ENCOUNTER — Other Ambulatory Visit (HOSPITAL_COMMUNITY): Payer: Self-pay

## 2021-12-24 DIAGNOSIS — I5020 Unspecified systolic (congestive) heart failure: Secondary | ICD-10-CM | POA: Diagnosis not present

## 2021-12-24 DIAGNOSIS — K921 Melena: Secondary | ICD-10-CM

## 2021-12-24 DIAGNOSIS — D649 Anemia, unspecified: Secondary | ICD-10-CM | POA: Diagnosis not present

## 2021-12-24 DIAGNOSIS — I714 Abdominal aortic aneurysm, without rupture, unspecified: Secondary | ICD-10-CM | POA: Diagnosis not present

## 2021-12-24 DIAGNOSIS — I214 Non-ST elevation (NSTEMI) myocardial infarction: Secondary | ICD-10-CM | POA: Diagnosis not present

## 2021-12-24 DIAGNOSIS — K922 Gastrointestinal hemorrhage, unspecified: Secondary | ICD-10-CM | POA: Diagnosis not present

## 2021-12-24 DIAGNOSIS — I959 Hypotension, unspecified: Secondary | ICD-10-CM

## 2021-12-24 DIAGNOSIS — I255 Ischemic cardiomyopathy: Secondary | ICD-10-CM | POA: Diagnosis not present

## 2021-12-24 LAB — CBC WITH DIFFERENTIAL/PLATELET
Abs Immature Granulocytes: 0.02 10*3/uL (ref 0.00–0.07)
Basophils Absolute: 0.1 10*3/uL (ref 0.0–0.1)
Basophils Relative: 1 %
Eosinophils Absolute: 0.3 10*3/uL (ref 0.0–0.5)
Eosinophils Relative: 4 %
HCT: 34.8 % — ABNORMAL LOW (ref 39.0–52.0)
Hemoglobin: 10.4 g/dL — ABNORMAL LOW (ref 13.0–17.0)
Immature Granulocytes: 0 %
Lymphocytes Relative: 14 %
Lymphs Abs: 0.9 10*3/uL (ref 0.7–4.0)
MCH: 26.3 pg (ref 26.0–34.0)
MCHC: 29.9 g/dL — ABNORMAL LOW (ref 30.0–36.0)
MCV: 87.9 fL (ref 80.0–100.0)
Monocytes Absolute: 0.9 10*3/uL (ref 0.1–1.0)
Monocytes Relative: 14 %
Neutro Abs: 4.3 10*3/uL (ref 1.7–7.7)
Neutrophils Relative %: 67 %
Platelets: 143 10*3/uL — ABNORMAL LOW (ref 150–400)
RBC: 3.96 MIL/uL — ABNORMAL LOW (ref 4.22–5.81)
RDW: 18.6 % — ABNORMAL HIGH (ref 11.5–15.5)
WBC: 6.5 10*3/uL (ref 4.0–10.5)
nRBC: 0 % (ref 0.0–0.2)

## 2021-12-24 LAB — BASIC METABOLIC PANEL
Anion gap: 8 (ref 5–15)
Anion gap: 11 (ref 5–15)
BUN: 20 mg/dL (ref 8–23)
BUN: 19 mg/dL (ref 8–23)
CO2: 25 mmol/L (ref 22–32)
CO2: 28 mmol/L (ref 22–32)
Calcium: 9.1 mg/dL (ref 8.9–10.3)
Calcium: 9.2 mg/dL (ref 8.9–10.3)
Chloride: 100 mmol/L (ref 98–111)
Chloride: 97 mmol/L — ABNORMAL LOW (ref 98–111)
Creatinine, Ser: 0.83 mg/dL (ref 0.61–1.24)
Creatinine, Ser: 0.84 mg/dL (ref 0.61–1.24)
GFR, Estimated: >60 mL/min (ref >60)
GFR, Estimated: >60 mL/min (ref >60)
Glucose, Bld: 107 mg/dL — ABNORMAL HIGH (ref 70–99)
Glucose, Bld: 101 mg/dL — ABNORMAL HIGH (ref 70–99)
Potassium: 4.3 mmol/L (ref 3.5–5.1)
Potassium: 4.5 mmol/L (ref 3.5–5.1)
Sodium: 133 mmol/L — ABNORMAL LOW (ref 135–145)
Sodium: 136 mmol/L (ref 135–145)

## 2021-12-24 LAB — CBC
HCT: 31.9 % — ABNORMAL LOW (ref 39.0–52.0)
Hemoglobin: 9.8 g/dL — ABNORMAL LOW (ref 13.0–17.0)
MCH: 26.4 pg (ref 26.0–34.0)
MCHC: 30.7 g/dL (ref 30.0–36.0)
MCV: 86 fL (ref 80.0–100.0)
Platelets: 137 10*3/uL — ABNORMAL LOW (ref 150–400)
RBC: 3.71 MIL/uL — ABNORMAL LOW (ref 4.22–5.81)
RDW: 18.5 % — ABNORMAL HIGH (ref 11.5–15.5)
WBC: 6.7 10*3/uL (ref 4.0–10.5)
nRBC: 0 % (ref 0.0–0.2)

## 2021-12-24 LAB — MAGNESIUM: Magnesium: 2.2 mg/dL (ref 1.7–2.4)

## 2021-12-24 LAB — SURGICAL PATHOLOGY

## 2021-12-24 LAB — TROPONIN I (HIGH SENSITIVITY): Troponin I (High Sensitivity): 11 ng/L (ref <18)

## 2021-12-24 MED ORDER — SODIUM CHLORIDE 0.9 % IV BOLUS
250.0000 mL | Freq: Once | INTRAVENOUS | Status: AC
  Administered 2021-12-24: 250 mL via INTRAVENOUS

## 2021-12-24 MED ORDER — ATORVASTATIN CALCIUM 40 MG PO TABS
40.0000 mg | ORAL_TABLET | Freq: Every day | ORAL | 1 refills | Status: DC
  Filled 2021-12-24: qty 30, 30d supply, fill #0

## 2021-12-24 MED ORDER — LOSARTAN POTASSIUM 50 MG PO TABS
50.0000 mg | ORAL_TABLET | Freq: Every day | ORAL | 1 refills | Status: DC
  Filled 2021-12-24: qty 30, 30d supply, fill #0

## 2021-12-24 MED ORDER — SPIRONOLACTONE 25 MG PO TABS
12.5000 mg | ORAL_TABLET | Freq: Every day | ORAL | 1 refills | Status: DC
  Filled 2021-12-24: qty 30, 60d supply, fill #0

## 2021-12-24 MED ORDER — PANTOPRAZOLE SODIUM 40 MG PO TBEC
40.0000 mg | DELAYED_RELEASE_TABLET | Freq: Two times a day (BID) | ORAL | 2 refills | Status: DC
  Filled 2021-12-24: qty 60, 30d supply, fill #0

## 2021-12-24 MED ORDER — ACETAMINOPHEN 325 MG PO TABS: 650.0000 mg | ORAL_TABLET | Freq: Four times a day (QID) | ORAL | Status: AC | PRN

## 2021-12-24 MED ORDER — FUROSEMIDE 20 MG PO TABS
20.0000 mg | ORAL_TABLET | Freq: Every day | ORAL | 3 refills | Status: DC | PRN
  Filled 2021-12-24: qty 30, 30d supply, fill #0

## 2021-12-24 MED ORDER — ASPIRIN 81 MG PO TBEC
81.0000 mg | DELAYED_RELEASE_TABLET | Freq: Every day | ORAL | 1 refills | Status: DC
  Filled 2021-12-24: qty 30, 30d supply, fill #0

## 2021-12-24 NOTE — Progress Notes (Signed)
Dr. Gasper Sells requested order be sent in for OP Lasix rx '20mg'$  daily PRN weight gain or LE swelling, have sent in.

## 2021-12-24 NOTE — Progress Notes (Signed)
Physical Therapy Treatment Patient Details Name: Greg [name] MRN: [id number] DOB: 06-30-1950 Today's Date: 12/24/2021   History of Present Illness Patient is a 71 year old male who presented to the hospital from GI with low Hgb. Patient was admitted with acute GI bleed,   EGD completed on 11/9 revealed " esophageal mucosal changes suspicious for short segment Barrett's esophagus, non bleeding gastric ulcer, and mucosal changes in duodenum that were biopsied".   PMH: AAA, iron deficiency anemia, HTN,    PT Comments    Patient resting in recliner at start of session. BP noted to be low and reassessed at start of session. Pt denied dizziness in recliner and upright sitting position. In standing pt reports "woohoo" or "woozy" feeling in head and return to sitting. Supervision only for sit<>stand this visit but further mobility limited by hypotension. Will continue to progress as able, continue to recommend SNF rehab with HHPT if pt refuses SNF follow up.   Orthostatic VS for the past 24 hrs:  BP- Lying Reclined with LE elevated Pulse- Lying BP- Sitting Pulse- Sitting BP- Standing at 0 minutes Pulse- Standing at 0 minutes  12/24/21 1406 (!) 82/64 82 90/68 83 (!) 79/64 98   MAP(70)  MAP (75)   MAP (70)         Recommendations for follow up therapy are one component of a multi-disciplinary discharge planning process, led by the attending physician.  Recommendations may be updated based on patient status, additional functional criteria and insurance authorization.  Follow Up Recommendations  Skilled nursing-short term rehab (<3 hours/day) (pt may refuse, he will need HHPT) Can patient physically be transported by private vehicle: Yes   Assistance Recommended at Discharge Intermittent Supervision/Assistance  Patient can return home with the following A little help with walking and/or transfers;A little help with bathing/dressing/bathroom;Assistance with cooking/housework;Assist for  transportation;Help with stairs or ramp for entrance   Equipment Recommendations  None recommended by PT    Recommendations for Other Services       Precautions / Restrictions Precautions Precautions: Fall Precaution Comments: watch BP Restrictions Weight Bearing Restrictions: No     Mobility  Bed Mobility               General bed mobility comments: pt OOB in recliner    Transfers Overall transfer level: Needs assistance Equipment used: Rolling walker (2 wheels) Transfers: Sit to/from Stand Sit to Stand: Supervision           General transfer comment: supervision for safety, pt using bil UE for power up and to control lowering to recliner.    Ambulation/Gait                   Stairs             Wheelchair Mobility    Modified Rankin (Stroke Patients Only)       Balance Overall balance assessment: Needs assistance Sitting-balance support: No upper extremity supported, Feet supported Sitting balance-Leahy Scale: Good     Standing balance support: Bilateral upper extremity supported Standing balance-Leahy Scale: Fair                              Cognition Arousal/Alertness: Awake/alert Behavior During Therapy: WFL for tasks assessed/performed, Flat affect Overall Cognitive Status: No family/caregiver present to determine baseline cognitive functioning  General Comments: Delayed responses and cues focus on task at hand, slightly flat affect        Exercises      General Comments        Pertinent Vitals/Pain Pain Assessment Pain Assessment: No/denies pain    Home Living                          Prior Function            PT Goals (current goals can now be found in the care plan section) Acute Rehab PT Goals Patient Stated Goal: Regain IND PT Goal Formulation: With patient Time For Goal Achievement: 01/05/22 Potential to Achieve Goals:  Good Progress towards PT goals: Progressing toward goals    Frequency    Min 3X/week      PT Plan Current plan remains appropriate    Co-evaluation              AM-PAC PT "6 Clicks" Mobility   Outcome Measure  Help needed turning from your back to your side while in a flat bed without using bedrails?: None Help needed moving from lying on your back to sitting on the side of a flat bed without using bedrails?: None Help needed moving to and from a bed to a chair (including a wheelchair)?: A Little Help needed standing up from a chair using your arms (e.g., wheelchair or bedside chair)?: A Little Help needed to walk in hospital room?: A Little Help needed climbing 3-5 steps with a railing? : A Lot 6 Click Score: 19    End of Session Equipment Utilized During Treatment: Gait belt Activity Tolerance: Patient tolerated treatment well (limited by hypotension) Patient left: in chair;with call bell/phone within reach Nurse Communication: Mobility status PT Visit Diagnosis: Difficulty in walking, not elsewhere classified (R26.2);Muscle weakness (generalized) (M62.81)     Time: 7616-0737 PT Time Calculation (min) (ACUTE ONLY): 17 min  Charges:  $Therapeutic Activity: 8-22 mins                     Verner Mould, DPT Acute Rehabilitation Services Office [phone]  12/24/21 2:45 PM

## 2021-12-24 NOTE — Progress Notes (Signed)
PROGRESS NOTE    Greg [name]  OAC:[id number] DOB: 13-Feb-1950 DOA: 12/18/2021 PCP: Pcp, No    Chief Complaint  Patient presents with   Shortness of Breath    Brief Narrative:  Patient 71 year old gentleman history of iron deficiency anemia, AAA, hypertension presented to ED for evaluation of low hemoglobin.  Patient noted to have been referred to GI for iron deficiency anemia had blood work performed with a hemoglobin of 6.1 and sent to the ED.  Patient does endorse some intermittent shortness of breath, intermittent diffuse chest pain as well as melanotic stools.  Patient seen in the ED afebrile, noted to have sats on room air with stable vitals.  Chest x-ray negative for any cardiopulmonary disease.  Hemoglobin noted at 5.7 and platelet count of 112.  FOBT was positive.  Troponins elevated.  Potassium noted at 3.2.  CT angiogram chest abdomen and pelvis notable for 3.3 x 3.4 cm AAA without dissection, aneurysm of the right iliac artery with small dissection flap, aneurysm of the left common iliac artery, mild dilatation of proximal celiac artery with small dissection flap, emphysema, 4 mm right middle lobe nodule.  It is noted per admitting physician ED discussed case with vascular surgery who reviewed images and recommended outpatient follow-up.  Patient placed on IV Protonix, transfused 1 units RBCs with posttransfusion CBC of approximately 7.7.  Due to elevated troponins, concern for demand ischemia 2 more units of packed red blood cells ordered.  GI consulted.  Cardiology consulted.   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 -Repeat CBC with hemoglobin of 5.7 on admission -Patient status post 3 units packed red blood cells with posttransfusion hemoglobin of 9.8 this morning which seems to be stabilizing. -Due to rising troponins, concern for demand ischemia patient transfused accordingly to keep hemoglobin in the 8-10 range. -Was on a Protonix drip and being transitioned to IV PPI twice daily.  -IV PPI changed to oral PPI.   -GI consulted recommending upper endoscopy/EGD which was done 12/20/2021 which showed esophageal mucosal changes suspicious for short term Barrett's esophagus which were biopsied, nonbleeding gastric ulcer with no stigmata of bleeding, biopsy, mucosal changes in the duodenum which were biopsied.   -Per GI colonoscopy deferred at this time as patient does not wish to pursue a colonoscopy and felt to be technically difficult in the setting of large right inguinal hernia which contains a component of small and large bowel. -GI recommending PPI twice daily for at least 10 weeks, avoid all NSAIDs, serial H&H and transfuse as needed. -GI okay with starting patient on aspirin. -Follow H&H, transfusion threshold hemoglobin < 8.  2.  Elevated troponin/history of CAD/?  Anterior/septal MI per EKG/probable non-STEMI/probable ischemic cardiomyopathy -Concern for non-STEMI/ischemic cardiomyopathy versus a demand ischemia. -Patient with lower extremity bilateral  edema on admission which improved after dose of IV Lasix. -2D echo with EF of 35 to 40%, wall motion abnormalities with severe hypokinesis of the mid to apical anterior septal, inferior septal, anterior walls.  Apical  hypokinesis suggesting LAD territory infarction.  Grade 2 diastolic dysfunction.  Normal right ventricular systolic function.  Mild MVR.  -Patient seen initially in consultation by cardiology who feels likely elevated troponin in the setting of profound anemia and due to anemia likely causing the peri-infarct ischemia, not a candidate for cath even if 2D echo is abnormal and recommended no aspirin/no heparin given concern for blood loss anemia, and no further work-up to be considered until GI evaluation is completed and source of blood loss managed. -GI evaluation completed at this time, however cardiology feels not safe at this time to pursue cath and potential PCI. -Patient's lisinopril has been changed to Cozaar and spironolactone added.  Patient started on statin. -Per cardiology due to low heart rates in the 60s unable to start a beta-blocker early on during the hospitalization. -Heart rate improved and patient started on Toprol-XL per cardiology-due to nocturnal bradycardia Toprol-XL discontinued per cardiology. -Per cardiology at some point will likely need ischemic evaluation once GI bleed has been evaluated and evaluation completed by GI. -GI okay with starting patient on aspirin. -Patient seen by cardiology, 12/23/2021 and per cardiology note patient declining cardiac cath. -Patient seen by cardiology today and recommending outpatient follow-up and consideration for ischemic evaluation at that time. -Appreciate cardiology input and recommendations. -Due to recent low blood pressure and concern for cardiac etiology will discuss with GI whether patient is cleared from their perspective for dual antiplatelet therapy if needed.  3.  AAA/?  Flap/bilateral common iliac artery aneurysm -Per admitting physician, ED's note discussed with vascular surgery who recommended outpatient follow-up.  4.  Hypertension -BP currently controlled early on during the hospitalization. -Lisinopril discontinued  and patient started on Cozaar and spironolactone per cardiology.  -Patient started on Toprol-XL per cardiology, however due to nocturnal bradycardia beta-blocker has been discontinued per cardiology. -Patient noted now with hypotension just prior to discharge and as such we will hold Cozaar spironolactone for now.  5.  Hypokalemia -Repleted.   -Potassium of 4.3 today.   -Magnesium at 2.2.  -Repeat labs in the AM.  6.  Thrombocytopenia -??  Etiology. -No signs of acute infection, patient denies any alcohol use.  No mention of schistocytes on smear review. -HIV nonreactive. -Vitamin B12 at 370 -Folate at 7.2. -Copper pending. -Thrombocytopenia improving. -Follow-up.  7.  Lung nodule -Admitting physician discussed with patient. -Outpatient follow-up with pulmonary.  8.  Nocturnal bradycardia/pauses -Noted on telemetry. -Cardiology reviewed telemetry and feel patient has a 2-1 AV block overnight likely secondary to probable undiagnosed OSA. -Will likely need outpatient sleep study.  9.  Hypotension -Patient was to be discharged however just before discharge RN noticed blood pressure of 84/61 going down to 78/64 standing with some complaints of dizziness and as such discharge canceled. -Patient currently afebrile.  Repeat CBC to follow-up on hemoglobin.  Check a basic metabolic profile.  Check cardiac enzymes.  Normal saline 250 cc bolus x1 and repeat blood pressure after bolus is given.  Hold spironolactone and Cozaar. -??  Cardiac etiology -Follow.   DVT prophylaxis: SCDs Code Status: Full Family Communication: Updated patient.  No family at bedside. Disposition: TBD  Status is: Inpatient Remains inpatient appropriate because: Severity of illness   Consultants:  Cardiology: Dr. Harrell Gave 12/19/2021 Gastroenterology: Dr. Tarri Glenn 12/19/2021  Procedures:  CT angiogram chest abdomen and pelvis 12/18/2021 Chest x-ray 12/18/2021 2D echo 12/19/2021 Transfusion 3 units packed  red blood cells Upper endoscopy: 12/20/2021 per Dr. Tarri Glenn  Antimicrobials:  None   Subjective: Sitting up in bed.  Denies any significant shortness of breath.  No chest pain.  No abdominal pain.  No further melanotic stools.  Anxious to go home.  Patient was to be discharged home however was called by RN patient noted to have systolic blood pressures in the 70s to 80s with some complaints of dizziness and as such discharge canceled.    Objective: Vitals:   12/23/21 1202 12/23/21 2034 12/24/21 0522 12/24/21 1350  BP: 107/82 104/70 119/74 (!) 84/61  Pulse: 71 75 83 81  Resp: '18 20 18   '$ Temp: 97.8 F (36.6 C) 98.4 F (36.9 C) 98.8 F (37.1 C) 98 F (36.7 C)  TempSrc: Oral Oral Oral Oral  SpO2: 100% 100% 99% 100%  Weight:   59.6 kg   Height:        Intake/Output Summary (Last 24 hours) at 12/24/2021 1439 Last data filed at 12/23/2021 1700 Gross per 24 hour  Intake 360 ml  Output 1000 ml  Net -640 ml    Filed Weights   12/22/21 0428 12/23/21 0505 12/24/21 0522  Weight: 61.5 kg 58.7 kg 59.6 kg    Examination:  General exam: NAD.  Frail. Respiratory system: Clear to auscultation bilaterally.  No wheezes, no crackles, no rhonchi.  Fair air movement.  Speaking in full sentences.  No use of accessory muscles of respiration.  Cardiovascular system: RRR no murmurs rubs or gallops.  No JVD.  No lower extremity edema.  Gastrointestinal system: Abdomen is soft, nontender, nondistended, positive bowel sounds.  No rebound.  No guarding.  Central nervous system: Alert and oriented. No focal neurological deficits. Extremities: No lower extremity edema.  Skin: No rashes, lesions or ulcers Psychiatry: Judgement and insight appear fair. Mood & affect appropriate.     Data Reviewed: I have personally reviewed following labs and imaging studies  CBC: Recent Labs  Lab 12/18/21 1125 12/18/21 1920 12/19/21 0731 12/20/21 1623 12/21/21 0453 12/22/21 0855 12/23/21 0545  12/24/21 0551  WBC 4.6 4.5   < > 6.2 5.9 6.2 5.5 6.7  NEUTROABS 2.8 R 2.9  --   --  3.7  --   --   --   HGB 6.1 Repeated and verified X2.* 5.7*   < > 11.0* 9.9* 9.8* 9.7* 9.8*  HCT 20.5 Repeated and verified X2.* 19.8*   < > 33.9* 31.0* 31.1* 32.3* 31.9*  MCV 79.0 83.5   < > 82.7 83.1 85.2 87.5 86.0  PLT 104.0* 112*   < > 109* 119* 120* 116* 137*   < > = values in this interval not displayed.     Basic Metabolic Panel: Recent Labs  Lab 12/19/21 0520 12/20/21 0447 12/21/21 0453 12/22/21 0855 12/23/21 0545 12/24/21 0551  NA 139 139 135 137 136 133*  K 3.3* 3.4* 3.8 3.9 4.1 4.3  CL 107 105 101 103 103 100  CO2 '24 24 26 26 25 25  '$ GLUCOSE 95 84 117* 102* 97 107*  BUN '12 8 19 17 17 20  '$ CREATININE 0.68 0.80 1.02 0.78 0.73 0.83  CALCIUM 8.9 8.8* 8.8* 8.9 9.0 9.1  MG 1.8 2.2  --  2.2 2.2 2.2     GFR: Estimated Creatinine Clearance: 69.8 mL/min (by C-G formula based on SCr of 0.83 mg/dL).  Liver Function Tests:  Recent Labs  Lab 12/18/21 1920  AST 21  ALT 15  ALKPHOS 42  BILITOT 0.4  PROT 7.4  ALBUMIN 4.1     CBG: No results for input(s): "GLUCAP" in the last 168 hours.   No results found for this or any previous visit (from the past 240 hour(s)).       Radiology Studies: No results found.      Scheduled Meds:  aspirin EC  81 mg Oral Daily   atorvastatin  40 mg Oral Daily   losartan  50 mg Oral Daily   pantoprazole  40 mg Oral BID   sodium chloride flush  3 mL Intravenous Q12H   spironolactone  12.5 mg Oral Daily   Continuous Infusions:  sodium chloride     sodium chloride       LOS: 6 days    Time spent: 40 minutes    Irine Seal, MD [facility]   To contact the attending provider between 7A-7P or the covering provider during after hours 7P-7A, please log into the web site [email or web address] and access using universal [place] password for that web site. If you do not have the password, please call the hospital  operator.  12/24/2021, 2:39 PM

## 2021-12-24 NOTE — Progress Notes (Addendum)
Patient ID: Greg [name], male   DOB: 12-14-1950, 71 y.o.   MRN: [id number]    Brief GI note -  Patient had been seen earlier this admission by GI for iron deficiency anemia and heme positive stool. EGD on 12/20/2021 by Dr. Tarri Glenn with findings of probable Barrett's esophagus and had one 6 mm cratered nonbleeding gastric ulcer as well as patchy duodenitis. Biopsies of the esophagus showed reactive squamous mucosa mild chronic gastritis with focal intestinal metaplasia negative for dysplasia, gastric biopsies with reactive gastropathy no H. pylori or intestinal metaplasia peptic duodenitis.  Patient declined colonoscopy, and also had been noted to have a large inguinal hernia which may have made colonoscopy technically difficult.  Plan was for twice daily PPI x10 to 12 weeks, then daily PPI.  Patient was to be discharged today however developed shortness of breath and hypotension, and cardiology is to reevaluate with concerns for possible NSTEMI.  Has history of probable ischemic cardiomyopathy, EF 35 to 40%.  We have been asked to comment regarding potential need for dual antiplatelet therapy.  Patient is not having active GI bleeding. It is not certain that the source of his iron deficiency anemia is the small nonbleeding gastric ulcer. He could have another source for occult GI blood loss and had declined colonoscopy.  Would continue twice daily PPI and monitor serial hemoglobins. He will be at higher risk for GI bleeding if he requires dual antiplatelet therapy, however a nonbleeding 6 mm cratered gastric ulcer and duodenitis do not preclude the use of dual antiplatelet therapy if needed from cardiac standpoint.

## 2021-12-24 NOTE — Plan of Care (Signed)
  Problem: Education: Goal: Knowledge of General Education information will improve Description Including pain rating scale, medication(s)/side effects and non-pharmacologic comfort measures Outcome: Progressing   

## 2021-12-24 NOTE — TOC Transition Note (Signed)
Transition of Care [facility]) - CM/SW Discharge Note   Patient Details  Name: Greg [name] MRN: [id number] Date of Birth: 1950-12-14  [facility] CM/SW Contact:  Vassie Moselle, LCSW Phone Number: 12/24/2021, 1:11 PM   Clinical Narrative:   Home health PT/OT//SW has been arranged with [facility].  Met with pt who declines having RW ordered and denies having other DME needs. CSW spoke with pt's sister who shares that pt has a PCP with [place]. Pt's sister is agreeable to schedule a follow up appointment for this pt at his PCP.  Per pharmacy pt has no prescription drug coverage. Pt has Medicare A and B however, does not have part D. As pt has insurance he will not qualify for [facility] program.    Final next level of care: Home w Home Health Services Barriers to Discharge: No Barriers Identified   Patient Goals and CMS Choice Patient states their goals for this hospitalization and ongoing recovery are:: To go home CMS Medicare.gov Compare Post Acute Care list provided to:: Patient Choice offered to / list presented to : Patient, Sibling  Discharge Placement                       Discharge Plan and Services In-house Referral: NA Discharge Planning Services: CM Consult Post Acute Care Choice: Home Health          DME Arranged: N/A DME Agency: NA       HH Arranged: PT, OT, Nurse's Aide, Social Work [employer] Agency: [place] Date [place] Agency Contacted: 12/24/21 Time [place]: 5 Representative spoke with at [place]: [place] Determinants of Health ([place]) Interventions     Readmission Risk Interventions    12/24/2021    1:07 PM 12/20/2021   10:08 AM 12/20/2021   10:03 AM  Readmission Risk Prevention Plan  Post Dischage Appt Complete Complete Complete  Medication Screening Complete Complete Complete  Transportation Screening Complete Complete Complete

## 2021-12-24 NOTE — Discharge Summary (Addendum)
Physician Discharge Summary  Greg [name] OYD:[id number] DOB: 09/04/1950 DOA: 12/18/2021  PCP: Pcp, No  Admit date: 12/18/2021 Discharge date: 12/24/2021  Time spent: 60 minutes  Recommendations for Outpatient Follow-up:  Follow-up with Laurann Montana, NP cardiology on 01/07/2022 at 3:35 PM. Follow-up with PCP in 2 weeks.  On follow-up patient will need a basic metabolic profile done to follow-up on electrolytes and renal function.  Patient need a CBC done to follow-up on hemoglobin.  Patient will benefit from outpatient referral for follow-up on lung nodule.  Patient also need outpatient referral for follow-up with vascular surgery for AAA. Follow-up with Dr. Rush Landmark, gastroenterology in 4 weeks. Follow-up with vascular and vein specialist [place], in 3 weeks for follow-up on AAA.   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 Emilio Aspen Figg is a 71 y.o. male with medical history significant for iron deficiency anemia, AAA, and hypertension who presents to the emergency department for evaluation of low hemoglobin.   Patient was referred to GI for iron deficiency anemia, had blood work performed revealing hemoglobin of 6.1, and he was directed to the  ED.  He denies any abdominal pain, denies vomiting, and denies seeing any red blood in his stool but has had some intermittently dark stool.  He denies chest pain or shortness of breath.   ED Course: Upon arrival to the ED, patient is found to be afebrile and saturating well on room air with stable blood pressure.  Chest x-ray is negative for acute pulmonary disease.  Globin is 5.7 and platelets 112,000.  Fecal occult blood testing is positive.  Troponin was elevated to 112 and then 147.  Potassium was 3.2.  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   ED discussed the case with vascular surgery who reviewed the images and recommends outpatient evaluation for the vascular findings.  IV Protonix was started in the ED, 2 units of RBC were ordered for transfusion, and ED physician sent a message to GI with request for a.m. consult.  Hospital Course:  #1 acute GI bleed/acute blood loss anemia likely secondary to gastric ulcer by EGD 12/20/2021 -Patient presented from [facility] office with a hemoglobin of 6.1 being evaluated for iron deficiency anemia, patient endorsed some melanotic stools. -Repeat CBC with hemoglobin of 5.7 on admission -Patient status post 3 units packed red blood cells with posttransfusion hemoglobin stabilized at 9.8 by day of discharge.  -Due to rising troponins, concern for demand ischemia patient transfuse accordingly to keep hemoglobin in the  8-10 range. -Was on a Protonix drip and being transitioned to IV PPI twice daily.   -GI consulted recommending upper endoscopy/EGD which was done 12/20/2021 which showed esophageal mucosal changes suspicious for short term Barrett's esophagus which were biopsied, nonbleeding gastric ulcer with no stigmata of bleeding,  biopsy, mucosal changes in the duodenum which were biopsied.   -Per GI colonoscopy deferred at this time as patient does not wish to pursue a colonoscopy and felt to be technically difficult in the setting of large right inguinal hernia which contains a component of small and large bowel. -GI recommended PPI twice daily for at least 10 weeks, avoid all NSAIDs, serial H&H and transfuse as needed. -GI okay with starting patient on aspirin. -Outpatient follow-up with GI in about 4 weeks as patient will need repeat EGD done in approximately 8 weeks.   2.  Elevated troponin/history of CAD/?  Anterior/septal MI per EKG/probable non-STEMI/probable ischemic cardiomyopathy -Concern for non-STEMI/ischemic cardiomyopathy versus a demand ischemia. -Patient with lower extremity bilateral edema on admission which improved after dose of IV Lasix. -2D echo with EF of 35 to 40%, wall motion abnormalities with severe hypokinesis of the mid to apical anterior septal, inferior septal, anterior walls.  Apical hypokinesis suggesting LAD territory infarction.  Grade 2 diastolic dysfunction.  Normal right ventricular systolic function.  Mild MVR.  -Patient seen initially in consultation by cardiology who feels likely elevated troponin in the setting of profound anemia and due to anemia likely causing the peri-infarct ischemia, not a candidate for cath even if 2D echo is abnormal and recommended no aspirin/no heparin given concern for blood loss anemia, and no further work-up to be considered until GI evaluation is completed and source of blood loss managed. -GI evaluation completed at this time, however cardiology feels not safe at this time to pursue cath and potential PCI. -Patient's lisinopril has been changed to Cozaar and spironolactone added.  Patient started on statin. -Per cardiology due to low heart rates in the 60s unable to start a beta-blocker early on during the hospitalization. -Heart rate improved and patient  started on Toprol-XL per cardiology-due to nocturnal bradycardia Toprol-XL discontinued per cardiology. -Per cardiology at some point will likely need ischemic evaluation once GI bleed has been evaluated and evaluation completed by GI. -GI okay with starting patient on aspirin. -Patient started on aspirin. -Patient seen by cardiology, 12/23/2021 and per cardiology note patient declining cardiac cath during this hospitalization. -Patient seen by cardiology on day of discharge 12/24/2021 and recommending outpatient follow-up for consideration of ischemic evaluation in the outpatient setting.   3.  AAA/?  Flap/bilateral common iliac artery aneurysm -Per admitting physician, ED's note discussed with vascular surgery who recommended outpatient follow-up.   4.  Hypertension -BP controlled during the hospitalization.   -Lisinopril discontinued and patient started on Cozaar and spironolactone per cardiology.  -Patient started on Toprol-XL per cardiology, however due to nocturnal bradycardia beta-blocker was subsequently discontinued per cardiology. -Outpatient follow-up with cardiology.   5.  Hypokalemia -Repleted.  -Outpatient follow-up.   6.  Thrombocytopenia -??  Etiology. -No signs of acute infection, patient denied any alcohol use.  No mention of schistocytes on smear review. -HIV nonreactive. -Vitamin B12 at 370 -Folate at 7.2. -Copper pending at time of discharge. -Thrombocytopenia improved during the hospitalization. -Outpatient follow-up.   7.  Lung nodule -Admitting physician discussed with patient. -Outpatient follow-up with pulmonary.   8.  Nocturnal bradycardia/pauses -Noted on telemetry. -Cardiology reviewed telemetry and feel patient has a 2-1 AV  block overnight likely secondary to probable undiagnosed OSA. -Will likely need outpatient sleep study. -Beta-blockers subsequently discontinued. -Outpatient follow-up with cardiology.   9.  Hypotension -Patient was to be  discharged however just before discharge RN noticed blood pressure of 84/61 going down to 78/64 standing with some complaints of dizziness and as such discharge initially canceled. -Patient currently afebrile.  Repeat labs were ordered.  Patient received a bolus of normal saline 250 cc x 1 with repeat blood pressure of 116/69.  Patient was adamant on being discharged.  Case discussed with cardiology who recommended discontinuation of as needed Lasix as well as spironolactone on discharge.   -Outpatient follow-up with cardiology.  [place]  Procedures: CT angiogram chest abdomen and pelvis 12/18/2021 Chest x-ray 12/18/2021 2D echo 12/19/2021 Transfusion 3 units packed red blood cells Upper endoscopy: 12/20/2021 per Dr. Noel Gerold  Consultations: Cardiology: Dr. Harrell Gave 12/19/2021 Gastroenterology: Dr. Tarri Glenn 12/19/2021  Discharge Exam: Vitals:   12/24/21 1350 12/24/21 1613  BP: (!) 84/61 116/69  Pulse: 81 82  Resp:    Temp: 98 F (36.7 C)   SpO2: 100%     General: NAD Cardiovascular: Regular rate and rhythm no murmurs rubs or gallops.  No JVD.  No lower extremity edema. Respiratory: Clear to auscultation bilaterally.  No wheezes, no crackles, no rhonchi.  Fair air movement.  Speaking in full sentences.  Discharge Instructions   Discharge Instructions     Diet - low sodium heart healthy   Complete by: As directed    Increase activity slowly   Complete by: As directed       Allergies as of 12/24/2021   No Known Allergies      Medication List     STOP taking these medications    lisinopril 5 MG tablet Commonly known as: ZESTRIL   mupirocin ointment 2 % Commonly known as: BACTROBAN   polyethylene glycol powder 17 GM/SCOOP powder Commonly known as: GLYCOLAX/MIRALAX   TUSSIN PO   VITAMIN B6 PO       TAKE these medications    acetaminophen 325 MG tablet Commonly known as: TYLENOL Take 2 tablets (650 mg total) by mouth every 6 (six) hours as needed for mild  pain (or Fever >/= 101).   aspirin EC 81 MG tablet Take 1 tablet (81 mg total) by mouth daily. Swallow whole. Start taking on: December 25, 2021   atorvastatin 40 MG tablet Commonly known as: LIPITOR Take 1 tablet (40 mg total) by mouth daily. Start taking on: December 25, 2021   losartan 50 MG tablet Commonly known as: COZAAR Take 1 tablet (50 mg total) by mouth daily. Start taking on: December 25, 2021   OVER THE COUNTER MEDICATION Over the counter pain relief   pantoprazole 40 MG tablet Commonly known as: PROTONIX Take 1 tablet (40 mg total) by mouth 2 (two) times daily.       No Known Allergies  Follow-up Information     Loel Dubonnet, NP Follow up.   Specialty: Cardiology Why: Hospital follow-up with Cardiology scheduled for 01/07/2022 at 3:35pm. Please arrive 15 minutes early for check-in. If this date/time does not work for you, please call our office to reschedule. Contact information: [place] [address] [phone]         PCP. Schedule an appointment as soon as possible for a visit in 2 week(s).          Mansouraty, Telford Nab., MD. Schedule an appointment as soon as possible for a visit  in 4 week(s).   Specialties: Gastroenterology, Internal Medicine Contact information: [place] [address] [phone]         [facility] Follow up.   Why: [facility] will be providing home health services and will follow up with you at discharge. Contact information: [place] [address] [phone]         [facility]. Schedule an appointment as soon as possible for a visit in 3 week(s).   Specialty: Vascular Surgery Contact information: [address] [place] [phone]                 The results of significant diagnostics from this hospitalization (including imaging, microbiology, ancillary and laboratory) are listed below  for reference.    Significant Diagnostic Studies: ECHOCARDIOGRAM COMPLETE  Result Date: 12/19/2021    ECHOCARDIOGRAM REPORT   Patient Name:   Greg [name] Date of Exam: 12/19/2021 Medical Rec #:  [id number]      Height:       68.0 in Accession #:    [id number]     Weight:       132.0 lb Date of Birth:  26-Feb-1950      BSA:          1.713 m Patient Age:    7 years       BP:           147/89 mmHg Patient Gender: M              HR:           65 bpm. Exam Location:  Inpatient Procedure: 2D Echo, Cardiac Doppler, Color Doppler and Intracardiac            Opacification Agent Indications:    Elevated Troponin  History:        Patient has no prior history of Echocardiogram examinations.                 Risk Factors:Hypertension. AAA. Acute GI Bleed. Acute on Chronic                 Anemia.  Sonographer:    Darlina Sicilian RDCS Referring Phys: [id number] TIMOTHY S OPYD  Sonographer Comments: Unable to complete exam, patient being moved from the ED to the floor. IMPRESSIONS  1. Left ventricular ejection fraction, by estimation, is 35 to 40%. The left ventricle has moderately decreased function. The left ventricle demonstrates regional wall motion abnormalities with severe hypokinesis of the mid to apical anteroseptal, inferoseptal, and anterior walls. Apical hypokinesis. This suggests LAD territory infarction. Left ventricular diastolic parameters are consistent with Grade II diastolic dysfunction (pseudonormalization).  2. Right ventricular systolic function is normal. The right ventricular size is normal. Tricuspid regurgitation signal is inadequate for assessing PA pressure.  3. The mitral valve is normal in structure. Mild mitral valve regurgitation. No evidence of mitral stenosis.  4. The aortic valve was not well visualized. Aortic valve regurgitation is not visualized. No aortic stenosis is present.  5. The inferior vena cava is dilated in size with <50% respiratory variability, suggesting right atrial pressure of 15  mmHg. FINDINGS  Left Ventricle: Left ventricular ejection fraction, by estimation, is 35 to 40%. The left ventricle has moderately decreased function. The left ventricle demonstrates regional wall motion abnormalities. Definity contrast agent was given IV to delineate the left ventricular endocardial borders. The left ventricular internal cavity size was normal in size. There is no left ventricular  hypertrophy. Left ventricular diastolic parameters are consistent with Grade II diastolic dysfunction (pseudonormalization). Right Ventricle: The right ventricular size is normal. No increase in right ventricular wall thickness. Right ventricular systolic function is normal. Tricuspid regurgitation signal is inadequate for assessing PA pressure. Left Atrium: Left atrial size was normal in size. Right Atrium: Right atrial size was normal in size. Pericardium: There is no evidence of pericardial effusion. Mitral Valve: The mitral valve is normal in structure. Mild mitral valve regurgitation. No evidence of mitral valve stenosis. Tricuspid Valve: The tricuspid valve is normal in structure. Tricuspid valve regurgitation is trivial. Aortic Valve: The aortic valve was not well visualized. Aortic valve regurgitation is not visualized. No aortic stenosis is present. Pulmonic Valve: The pulmonic valve was not well visualized. Pulmonic valve regurgitation is not visualized. Aorta: The aortic root is normal in size and structure. Venous: The inferior vena cava is dilated in size with less than 50% respiratory variability, suggesting right atrial pressure of 15 mmHg. IAS/Shunts: No atrial level shunt detected by color flow Doppler.  LEFT VENTRICLE PLAX 2D LVOT diam:     1.90 cm      Diastology LV SV:         37           LV e' medial:    4.88 cm/s LV SV Index:   21           LV E/e' medial:  18.9 LVOT Area:     2.84 cm     LV e' lateral:   5.77 cm/s                             LV E/e' lateral: 16.0  LV Volumes (MOD) LV vol d, MOD  A2C: 132.0 ml LV vol d, MOD A4C: 134.0 ml LV vol s, MOD A2C: 69.1 ml LV vol s, MOD A4C: 66.3 ml LV SV MOD A2C:     62.9 ml LV SV MOD A4C:     134.0 ml LV SV MOD BP:      67.8 ml RIGHT VENTRICLE RV S prime:     11.90 cm/s TAPSE (M-mode): 2.3 cm LEFT ATRIUM             Index        RIGHT ATRIUM           Index LA Vol (A2C):   30.8 ml 17.98 ml/m  RA Area:     12.30 cm LA Vol (A4C):   38.0 ml 22.18 ml/m  RA Volume:   24.80 ml  14.48 ml/m LA Biplane Vol: 37.4 ml 21.83 ml/m  AORTIC VALVE LVOT Vmax:   68.50 cm/s LVOT Vmean:  43.800 cm/s LVOT VTI:    0.129 m  AORTA Ao Root diam: 3.00 cm MITRAL VALVE MV Area (PHT): 4.24 cm    SHUNTS MV Decel Time: 179 msec    Systemic VTI:  0.13 m MV E velocity: 92.10 cm/s  Systemic Diam: 1.90 cm MV A velocity: 74.10 cm/s MV E/A ratio:  1.24 Dalton McleanMD Electronically signed by Franki Monte Signature Date/Time: 12/19/2021/5:20:05 PM    Final    CT Angio Chest/Abd/Pel for Dissection W and/or Wo Contrast  Result Date: 12/18/2021 CLINICAL DATA:  Acute aortic syndrome suspected. Shortness of breath, known abdominal aortic aneurysm. Concern for dissection. EXAM: CT ANGIOGRAPHY CHEST, ABDOMEN AND PELVIS TECHNIQUE: Non-contrast CT of the chest was initially obtained. Multidetector CT imaging through the chest, abdomen  and pelvis was performed using the standard protocol during bolus administration of intravenous contrast. Multiplanar reconstructed images and MIPs were obtained and reviewed to evaluate the vascular anatomy. RADIATION DOSE REDUCTION: This exam was performed according to the departmental dose-optimization program which includes automated exposure control, adjustment of the mA and/or kV according to patient size and/or use of iterative reconstruction technique. CONTRAST:  118m OMNIPAQUE IOHEXOL 350 MG/ML SOLN COMPARISON:  09/10/2020. FINDINGS: CTA CHEST FINDINGS Cardiovascular: The heart is normal in size and there is no pericardial effusion. Multi-vessel coronary  artery calcifications are seen. There is atherosclerotic calcification of the aorta without evidence of aneurysm or dissection. The pulmonary trunk is normal in size. Mediastinum/Nodes: No mediastinal lymphadenopathy. A nonspecific prominent lymph node is present at the right hilum measuring 1 cm. No axillary lymphadenopathy. The thyroid gland, trachea, and esophagus are within normal limits. Lungs/Pleura: Advanced paraseptal and centrilobular emphysematous changes are present in the lungs. Apical pleural scarring is present bilaterally. There is a 4 mm nodule in the right middle lobe, axial image 123. No consolidation, effusion, or pneumothorax. Musculoskeletal: Degenerative changes are present in the thoracic spine. Mild compression deformities are present in the mid thoracic spine which are indeterminate in age. Review of the MIP images confirms the above findings. CTA ABDOMEN AND PELVIS FINDINGS VASCULAR Aorta: Atherosclerotic calcification and mural thrombus of the abdominal aorta with aneurysmal dilatation of the distal abdominal aorta measuring 3.3 x 3.4 cm. The aorta has a tortuous course. No dissection is seen. Celiac: There is aneurysmal dilatation of the mid celiac artery with a small dissection flap. The celiac artery and splenic artery are patent. The hepatic artery originates from the superior mesenteric artery which is a normal variant SMA: Patent without evidence of aneurysm, dissection, vasculitis or significant stenosis. Renals: Both renal arteries are patent without evidence of aneurysm, dissection, vasculitis, fibromuscular dysplasia or significant stenosis. IMA: The visualized portion of the IMA is patent. 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 There is atherosclerotic calcification with mural thrombus with dilatation of the common iliac artery on the left  measuring 1.8 cm. No dissection flap is seen. There is atherosclerotic calcification of the external and internal iliac arteries bilaterally with a mural thrombus with no significant stenosis, dissection, or aneurysm. Veins: No obvious venous abnormality within the limitations of this arterial phase study. Review of the MIP images confirms the above findings. NON-VASCULAR Hepatobiliary: Transient hepatic arterial differences are noted. No biliary ductal dilatation. The gallbladder is without stones. Pancreas: Unremarkable. No pancreatic ductal dilatation or surrounding inflammatory changes. Spleen: Normal in size without focal abnormality. Adrenals/Urinary Tract: The adrenal glands are within normal limits. The kidneys enhance symmetrically. No renal calculus or hydronephrosis. Bladder is unremarkable. Stomach/Bowel: There is mild thickening of the walls of the gastric antrum. No bowel obstruction, free air, or pneumatosis. There is a large right inguinal hernia with multiple loops of small and large bowel and mesentery extending into the scrotal sac on the right. The appendix is not visualized on exam. Lymphatic: No abdominal or pelvic lymphadenopathy. Reproductive: The prostate gland is enlarged. Other: No abdominopelvic ascites. Musculoskeletal: Mild degenerative changes in the lumbar spine. No acute osseous abnormality. Review of the MIP images confirms the above findings. IMPRESSION: 1. Aortic atherosclerosis with aneurysmal dilatation of the distal abdominal aorta measuring 3.3 x 3.4 cm. No dissection is seen. Recommend follow-up every 3 years. 2. Aneurysmal  dilatation of the common iliac artery on the right measuring 2.8 cm with a small dissection flap, increased in size from the prior exam. A vascular surgery consultation is recommended. 3. 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 4. 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 5. Advanced  emphysema. 6. 4 mm right middle lobe nodule. No follow-up needed if patient is low-risk.This recommendation follows the consensus statement: Guidelines for Management of Incidental Pulmonary Nodules Detected on CT Images: From the Fleischner Society 2017; Radiology 2017; 284:228-243. 7. Enlarged right inguinal hernia extending into the scrotal sac on the right containing nonobstructed small and large bowel and a portion of the mesentery. Electronically Signed   By: Brett Fairy M.D.   On: 12/18/2021 21:41   DG Chest 2 View  Result Date: 12/18/2021 CLINICAL DATA:  Short of breath EXAM: CHEST - 2 VIEW COMPARISON:  Chest 08/08/2020 FINDINGS: The heart size and mediastinal contours are within normal limits. Both lungs are clear. The visualized skeletal structures are unremarkable. IMPRESSION: No active cardiopulmonary disease. Electronically Signed   By: Franchot Gallo M.D.   On: 12/18/2021 18:15    Microbiology: No results found for this or any previous visit (from the past 240 hour(s)).   Labs: Basic Metabolic Panel: Recent Labs  Lab 12/19/21 0520 12/20/21 0447 12/21/21 0453 12/22/21 0855 12/23/21 0545 12/24/21 0551  NA 139 139 135 137 136 133*  K 3.3* 3.4* 3.8 3.9 4.1 4.3  CL 107 105 101 103 103 100  CO2 '24 24 26 26 25 25  '$ GLUCOSE 95 84 117* 102* 97 107*  BUN '12 8 19 17 17 20  '$ CREATININE 0.68 0.80 1.02 0.78 0.73 0.83  CALCIUM 8.9 8.8* 8.8* 8.9 9.0 9.1  MG 1.8 2.2  --  2.2 2.2 2.2   Liver Function Tests: Recent Labs  Lab 12/18/21 1920  AST 21  ALT 15  ALKPHOS 42  BILITOT 0.4  PROT 7.4  ALBUMIN 4.1   No results for input(s): "LIPASE", "AMYLASE" in the last 168 hours. No results for input(s): "AMMONIA" in the last 168 hours. CBC: Recent Labs  Lab 12/18/21 1125 12/18/21 1920 12/19/21 0731 12/21/21 0453 12/22/21 0855 12/23/21 0545 12/24/21 0551 12/24/21 1507  WBC 4.6 4.5   < > 5.9 6.2 5.5 6.7 6.5  NEUTROABS 2.8 R 2.9  --  3.7  --   --   --  4.3  HGB 6.1 Repeated and  verified X2.* 5.7*   < > 9.9* 9.8* 9.7* 9.8* 10.4*  HCT 20.5 Repeated and verified X2.* 19.8*   < > 31.0* 31.1* 32.3* 31.9* 34.8*  MCV 79.0 83.5   < > 83.1 85.2 87.5 86.0 87.9  PLT 104.0* 112*   < > 119* 120* 116* 137* 143*   < > = values in this interval not displayed.   Cardiac Enzymes: No results for input(s): "CKTOTAL", "CKMB", "CKMBINDEX", "TROPONINI" in the last 168 hours. BNP: BNP (last 3 results) Recent Labs    12/20/21 0707  BNP 502.6*    ProBNP (last 3 results) No results for input(s): "PROBNP" in the last 8760 hours.  CBG: No results for input(s): "GLUCAP" in the last 168 hours.     Signed:  Irine Seal MD.  [facility] 12/24/2021, 4:32 PM

## 2021-12-24 NOTE — Progress Notes (Addendum)
Progress Note  Patient Name: Greg [name] Date of Encounter: 12/24/2021  Primary Cardiologist: Buford Dresser, MD   Subjective   Overnight no events. Patient notes that he has a mixed up breathing that has resolved through this admission. No further bleeding.  Inpatient Medications    Scheduled Meds:  aspirin EC  81 mg Oral Daily   atorvastatin  40 mg Oral Daily   losartan  50 mg Oral Daily   pantoprazole  40 mg Oral BID   sodium chloride flush  3 mL Intravenous Q12H   spironolactone  12.5 mg Oral Daily   Continuous Infusions:  sodium chloride     PRN Meds: acetaminophen **OR** acetaminophen   Vital Signs    Vitals:   12/23/21 0505 12/23/21 1202 12/23/21 2034 12/24/21 0522  BP: (!) 141/98 107/82 104/70 119/74  Pulse: 84 71 75 83  Resp: '18 18 20 18  '$ Temp: 98.6 F (37 C) 97.8 F (36.6 C) 98.4 F (36.9 C) 98.8 F (37.1 C)  TempSrc: Oral Oral Oral Oral  SpO2: 99% 100% 100% 99%  Weight: 58.7 kg   59.6 kg  Height:        Intake/Output Summary (Last 24 hours) at 12/24/2021 0800 Last data filed at 12/23/2021 1700 Gross per 24 hour  Intake 600 ml  Output 1000 ml  Net -400 ml   Filed Weights   12/22/21 0428 12/23/21 0505 12/24/21 0522  Weight: 61.5 kg 58.7 kg 59.6 kg    Telemetry    SR- PVCs are artifact - Personally Reviewed  Physical Exam   Gen: no distress, thin male Neck: No JVD Cardiac: No Rubs or Gallops, no murmur, RRR+2  radial pulses Respiratory: Clear to auscultation bilaterally, normal effort, normal  respiratory rate GI: Soft, nontender, non-distended  MS: No  edema;  moves all extremities Integument: Skin feels warm Neuro:  At time of evaluation, alert and oriented to person/place/time/situation but does not show large understanding of health literacy Psych: Normal affect, patient feels well   Labs    Chemistry Recent Labs  Lab 12/18/21 1920 12/19/21 0520 12/22/21 0855 12/23/21 0545 12/24/21 0551  NA 141   < > 137  136 133*  K 3.2*   < > 3.9 4.1 4.3  CL 107   < > 103 103 100  CO2 24   < > '26 25 25  '$ GLUCOSE 93   < > 102* 97 107*  BUN 15   < > '17 17 20  '$ CREATININE 0.84   < > 0.78 0.73 0.83  CALCIUM 9.3   < > 8.9 9.0 9.1  PROT 7.4  --   --   --   --   ALBUMIN 4.1  --   --   --   --   AST 21  --   --   --   --   ALT 15  --   --   --   --   ALKPHOS 42  --   --   --   --   BILITOT 0.4  --   --   --   --   GFRNONAA >60   < > >60 >60 >60  ANIONGAP 10   < > '8 8 8   '$ < > = values in this interval not displayed.     Hematology Recent Labs  Lab 12/22/21 0855 12/23/21 0545 12/24/21 0551  WBC 6.2 5.5 6.7  RBC 3.65* 3.69* 3.71*  HGB 9.8* 9.7* 9.8*  HCT  31.1* 32.3* 31.9*  MCV 85.2 87.5 86.0  MCH 26.8 26.3 26.4  MCHC 31.5 30.0 30.7  RDW 18.4* 18.8* 18.5*  PLT 120* 116* 137*    Cardiac EnzymesNo results for input(s): "TROPONINI" in the last 168 hours. No results for input(s): "TROPIPOC" in the last 168 hours.   BNP Recent Labs  Lab 12/20/21 0707  BNP 502.6*     DDimer No results for input(s): "DDIMER" in the last 168 hours.   Radiology    No results found.  Cardiac Studies   Cardiac Studies & Procedures       ECHOCARDIOGRAM  ECHOCARDIOGRAM COMPLETE 12/19/2021  Narrative ECHOCARDIOGRAM REPORT    Patient Name:   Greg [name] Date of Exam: 12/19/2021 Medical Rec #:  [id number]      Height:       68.0 in Accession #:    [id number]     Weight:       132.0 lb Date of Birth:  Apr 21, 1950      BSA:          1.713 m Patient Age:    28 years       BP:           147/89 mmHg Patient Gender: M              HR:           65 bpm. Exam Location:  Inpatient  Procedure: 2D Echo, Cardiac Doppler, Color Doppler and Intracardiac Opacification Agent  Indications:    Elevated Troponin  History:        Patient has no prior history of Echocardiogram examinations. Risk Factors:Hypertension. AAA. Acute GI Bleed. Acute on Chronic Anemia.  Sonographer:    Darlina Sicilian RDCS Referring Phys:  [id number] TIMOTHY S OPYD   Sonographer Comments: Unable to complete exam, patient being moved from the ED to the floor. IMPRESSIONS   1. Left ventricular ejection fraction, by estimation, is 35 to 40%. The left ventricle has moderately decreased function. The left ventricle demonstrates regional wall motion abnormalities with severe hypokinesis of the mid to apical anteroseptal, inferoseptal, and anterior walls. Apical hypokinesis. This suggests LAD territory infarction. Left ventricular diastolic parameters are consistent with Grade II diastolic dysfunction (pseudonormalization). 2. Right ventricular systolic function is normal. The right ventricular size is normal. Tricuspid regurgitation signal is inadequate for assessing PA pressure. 3. The mitral valve is normal in structure. Mild mitral valve regurgitation. No evidence of mitral stenosis. 4. The aortic valve was not well visualized. Aortic valve regurgitation is not visualized. No aortic stenosis is present. 5. The inferior vena cava is dilated in size with <50% respiratory variability, suggesting right atrial pressure of 15 mmHg.  FINDINGS Left Ventricle: Left ventricular ejection fraction, by estimation, is 35 to 40%. The left ventricle has moderately decreased function. The left ventricle demonstrates regional wall motion abnormalities. Definity contrast agent was given IV to delineate the left ventricular endocardial borders. The left ventricular internal cavity size was normal in size. There is no left ventricular hypertrophy. Left ventricular diastolic parameters are consistent with Grade II diastolic dysfunction (pseudonormalization).  Right Ventricle: The right ventricular size is normal. No increase in right ventricular wall thickness. Right ventricular systolic function is normal. Tricuspid regurgitation signal is inadequate for assessing PA pressure.  Left Atrium: Left atrial size was normal in size.  Right Atrium: Right atrial  size was normal in size.  Pericardium: There is no evidence of pericardial effusion.  Mitral Valve: The mitral valve is normal  in structure. Mild mitral valve regurgitation. No evidence of mitral valve stenosis.  Tricuspid Valve: The tricuspid valve is normal in structure. Tricuspid valve regurgitation is trivial.  Aortic Valve: The aortic valve was not well visualized. Aortic valve regurgitation is not visualized. No aortic stenosis is present.  Pulmonic Valve: The pulmonic valve was not well visualized. Pulmonic valve regurgitation is not visualized.  Aorta: The aortic root is normal in size and structure.  Venous: The inferior vena cava is dilated in size with less than 50% respiratory variability, suggesting right atrial pressure of 15 mmHg.  IAS/Shunts: No atrial level shunt detected by color flow Doppler.   LEFT VENTRICLE PLAX 2D LVOT diam:     1.90 cm      Diastology LV SV:         37           LV e' medial:    4.88 cm/s LV SV Index:   21           LV E/e' medial:  18.9 LVOT Area:     2.84 cm     LV e' lateral:   5.77 cm/s LV E/e' lateral: 16.0  LV Volumes (MOD) LV vol d, MOD A2C: 132.0 ml LV vol d, MOD A4C: 134.0 ml LV vol s, MOD A2C: 69.1 ml LV vol s, MOD A4C: 66.3 ml LV SV MOD A2C:     62.9 ml LV SV MOD A4C:     134.0 ml LV SV MOD BP:      67.8 ml  RIGHT VENTRICLE RV S prime:     11.90 cm/s TAPSE (M-mode): 2.3 cm  LEFT ATRIUM             Index        RIGHT ATRIUM           Index LA Vol (A2C):   30.8 ml 17.98 ml/m  RA Area:     12.30 cm LA Vol (A4C):   38.0 ml 22.18 ml/m  RA Volume:   24.80 ml  14.48 ml/m LA Biplane Vol: 37.4 ml 21.83 ml/m AORTIC VALVE LVOT Vmax:   68.50 cm/s LVOT Vmean:  43.800 cm/s LVOT VTI:    0.129 m  AORTA Ao Root diam: 3.00 cm  MITRAL VALVE MV Area (PHT): 4.24 cm    SHUNTS MV Decel Time: 179 msec    Systemic VTI:  0.13 m MV E velocity: 92.10 cm/s  Systemic Diam: 1.90 cm MV A velocity: 74.10 cm/s MV E/A ratio:   1.24  Dalton McleanMD Electronically signed by Franki Monte Signature Date/Time: 12/19/2021/5:20:05 PM    Final              Patient Profile     71 y.o. male with acute GI bleed with finding of HF  Assessment & Plan     Heart Failure Reduced Ejection Fraction  HTN Sinus bradycardia - NYHA class I, Stage C, euvolemic, etiology from ischemia only - Diuretic regimen: Lasix 20 mg PO PRN Edema (planned for DC)  - continue ARB; outpatient ARNI if financially viable - SGLT2i as outpatient if financially viable - aldactone12.5 mg  - through past few days evaluation has had bradycardia and hypotension that has precluded beta blocker start.  He is nearing DC and will not start today for this reason - DC lisinopril at DC   Elevated Troponin Suspected underlying CAD Acute GI Bleed and acute on chronic anemia Tobacco abuse - troponin elevation likely demand ischemia related -  likely has underlying CAD - ASA has been started back with no further bleeding today - continue atorvastatin 40 mg - as outpatient, with resolution of bleeding can consider ischemic evaluation - he is pre-contemplative to stop smoking  ("No way man no how" in discussion of cessation.   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   Medication Recommendations:  ASA statin ARB MRA stop ACEi Other recommendations (labs, testing, etc):  NA Follow up as an outpatient:  01/07/22 APT for ARNI transition or BB add depending on BP; at that time ischemic evaluation may be considered if bleeding issues have resolved.    For questions or updates, please contact [facility] Please consult [email or web address] for contact info under Cardiology/STEMI.      Rudean Haskell, MD [place] [place], [place] [address] [phone]  8:00 AM  ADDENDUM: found to be hypotensive this AM.  DC is presently paused. Discussed with Dr. Grandville Silos: will get labs.  Given hypotension he will most assuredly have elevated biomarkers.  I suspect that this will be related to demand.  Please keep NPO at midnight: if he is cleared for DAPT, we would consider LHC based on his symptoms and BP. Will hold MRA now.  Rudean Haskell, MD [facility] [place], [address] [place], [place] [address] [phone]  3:02 PM

## 2021-12-25 ENCOUNTER — Encounter: Payer: Self-pay | Admitting: Gastroenterology

## 2021-12-29 ENCOUNTER — Other Ambulatory Visit (HOSPITAL_COMMUNITY): Payer: Self-pay

## 2021-12-29 LAB — COPPER, SERUM: Copper: 125 ug/dL (ref 69–132)

## 2022-01-07 ENCOUNTER — Ambulatory Visit (HOSPITAL_BASED_OUTPATIENT_CLINIC_OR_DEPARTMENT_OTHER): Payer: Medicare Other | Admitting: Family

## 2022-01-09 ENCOUNTER — Telehealth: Payer: Self-pay | Admitting: *Deleted

## 2022-01-09 ENCOUNTER — Other Ambulatory Visit: Payer: Self-pay

## 2022-01-09 DIAGNOSIS — K259 Gastric ulcer, unspecified as acute or chronic, without hemorrhage or perforation: Secondary | ICD-10-CM

## 2022-01-09 NOTE — Telephone Encounter (Signed)
Message from Thornton Park, MD sent at 12/20/2021  2:59 PM EST ----- Recent hospitalization for gastric ulcer.  Please arrange office visit with an APP in 3-4 weeks. EGD with Dr. Rush Landmark in 8 weeks.   KLB

## 2022-01-09 NOTE — Telephone Encounter (Signed)
Got it. Thanks. GM 

## 2022-01-09 NOTE — Telephone Encounter (Signed)
Follow up appt with Greg [name] on 01/16/22 at 10 am  EGD appt 02/08/22 at 330 pm  Left message on machine to call back

## 2022-01-09 NOTE — Telephone Encounter (Signed)
Dr. Rush Landmark,  This pt is cleared for anesthetic care at [facility].  Thanks,  Osvaldo Angst CRNA

## 2022-01-10 NOTE — Telephone Encounter (Signed)
The pt sister has been advised of the appt information.  She will bring the pt to the appt and discuss the EGD further at that time.

## 2022-01-15 ENCOUNTER — Telehealth: Payer: Self-pay | Admitting: Gastroenterology

## 2022-01-15 NOTE — Telephone Encounter (Signed)
Dr Rush Landmark this pt had an appt with Nevin Bloodgood for tomorrow but her schedule is now cancelled due to her having to cover the hospital.  See below.  He is scheduled for EGD on 12/29 but there are no appts for follow up with anyone prior to that date.  Is it ok to proceed with procedure without office visit?  "Message from Thornton Park, MD sent at 12/20/2021  2:59 PM EST ----- Recent hospitalization for gastric ulcer.  Please arrange office visit with an APP in 3-4 weeks. EGD with Dr. Rush Landmark in 8 weeks.   KLB"

## 2022-01-15 NOTE — Telephone Encounter (Signed)
Inbound call from patient sister to reschedule appt that was with Nevin Bloodgood NP tomorrow. From previous encounters it looks like patient needed to be seen with APP first before having egd. There are no appts available with APP before his scheduled procedure date. She is requesting a call to further advise.

## 2022-01-16 ENCOUNTER — Ambulatory Visit: Payer: Medicare Other | Admitting: Nurse Practitioner

## 2022-01-16 NOTE — Telephone Encounter (Signed)
Left message on machine to call back  

## 2022-01-16 NOTE — Telephone Encounter (Signed)
This will be fine for direct procedure.  Thanks. GM

## 2022-01-16 NOTE — Telephone Encounter (Signed)
The patient has been notified of this information and all questions answered.

## 2022-01-17 ENCOUNTER — Other Ambulatory Visit: Payer: Self-pay | Admitting: *Deleted

## 2022-01-17 DIAGNOSIS — M79605 Pain in left leg: Secondary | ICD-10-CM

## 2022-01-18 ENCOUNTER — Encounter (HOSPITAL_COMMUNITY): Payer: Self-pay

## 2022-01-18 ENCOUNTER — Emergency Department (HOSPITAL_COMMUNITY): Payer: Medicare Other

## 2022-01-18 ENCOUNTER — Inpatient Hospital Stay (HOSPITAL_COMMUNITY): Payer: Medicare Other

## 2022-01-18 ENCOUNTER — Inpatient Hospital Stay (HOSPITAL_COMMUNITY)
Admission: EM | Admit: 2022-01-18 | Discharge: 2022-01-26 | DRG: 377 | Disposition: A | Discharge status: Home / Self Care | Payer: Medicare Other | Attending: Internal Medicine | Admitting: Internal Medicine

## 2022-01-18 ENCOUNTER — Other Ambulatory Visit: Payer: Self-pay

## 2022-01-18 DIAGNOSIS — I714 Abdominal aortic aneurysm, without rupture, unspecified: Secondary | ICD-10-CM | POA: Diagnosis present

## 2022-01-18 DIAGNOSIS — I10 Essential (primary) hypertension: Secondary | ICD-10-CM | POA: Diagnosis present

## 2022-01-18 DIAGNOSIS — R54 Age-related physical debility: Secondary | ICD-10-CM | POA: Diagnosis present

## 2022-01-18 DIAGNOSIS — K648 Other hemorrhoids: Secondary | ICD-10-CM | POA: Diagnosis present

## 2022-01-18 DIAGNOSIS — D649 Anemia, unspecified: Principal | ICD-10-CM | POA: Diagnosis present

## 2022-01-18 DIAGNOSIS — I110 Hypertensive heart disease with heart failure: Secondary | ICD-10-CM | POA: Diagnosis present

## 2022-01-18 DIAGNOSIS — J438 Other emphysema: Secondary | ICD-10-CM | POA: Diagnosis present

## 2022-01-18 DIAGNOSIS — R195 Other fecal abnormalities: Secondary | ICD-10-CM | POA: Diagnosis not present

## 2022-01-18 DIAGNOSIS — I959 Hypotension, unspecified: Secondary | ICD-10-CM | POA: Diagnosis not present

## 2022-01-18 DIAGNOSIS — K635 Polyp of colon: Secondary | ICD-10-CM | POA: Diagnosis not present

## 2022-01-18 DIAGNOSIS — J9601 Acute respiratory failure with hypoxia: Secondary | ICD-10-CM | POA: Diagnosis present

## 2022-01-18 DIAGNOSIS — D61818 Other pancytopenia: Secondary | ICD-10-CM | POA: Diagnosis not present

## 2022-01-18 DIAGNOSIS — F17200 Nicotine dependence, unspecified, uncomplicated: Secondary | ICD-10-CM | POA: Diagnosis not present

## 2022-01-18 DIAGNOSIS — K31811 Angiodysplasia of stomach and duodenum with bleeding: Secondary | ICD-10-CM | POA: Diagnosis present

## 2022-01-18 DIAGNOSIS — Z720 Tobacco use: Secondary | ICD-10-CM

## 2022-01-18 DIAGNOSIS — I252 Old myocardial infarction: Secondary | ICD-10-CM | POA: Diagnosis not present

## 2022-01-18 DIAGNOSIS — I2510 Atherosclerotic heart disease of native coronary artery without angina pectoris: Secondary | ICD-10-CM | POA: Diagnosis present

## 2022-01-18 DIAGNOSIS — Z7982 Long term (current) use of aspirin: Secondary | ICD-10-CM

## 2022-01-18 DIAGNOSIS — Z7984 Long term (current) use of oral hypoglycemic drugs: Secondary | ICD-10-CM

## 2022-01-18 DIAGNOSIS — I5043 Acute on chronic combined systolic (congestive) and diastolic (congestive) heart failure: Secondary | ICD-10-CM

## 2022-01-18 DIAGNOSIS — J432 Centrilobular emphysema: Secondary | ICD-10-CM

## 2022-01-18 DIAGNOSIS — Z1152 Encounter for screening for COVID-19: Secondary | ICD-10-CM

## 2022-01-18 DIAGNOSIS — D126 Benign neoplasm of colon, unspecified: Secondary | ICD-10-CM

## 2022-01-18 DIAGNOSIS — F17210 Nicotine dependence, cigarettes, uncomplicated: Secondary | ICD-10-CM | POA: Diagnosis present

## 2022-01-18 DIAGNOSIS — R0603 Acute respiratory distress: Secondary | ICD-10-CM | POA: Diagnosis present

## 2022-01-18 DIAGNOSIS — E785 Hyperlipidemia, unspecified: Secondary | ICD-10-CM | POA: Diagnosis present

## 2022-01-18 DIAGNOSIS — R911 Solitary pulmonary nodule: Secondary | ICD-10-CM | POA: Diagnosis present

## 2022-01-18 DIAGNOSIS — I2489 Other forms of acute ischemic heart disease: Secondary | ICD-10-CM | POA: Diagnosis present

## 2022-01-18 DIAGNOSIS — Z635 Disruption of family by separation and divorce: Secondary | ICD-10-CM

## 2022-01-18 DIAGNOSIS — I5021 Acute systolic (congestive) heart failure: Secondary | ICD-10-CM | POA: Insufficient documentation

## 2022-01-18 DIAGNOSIS — D509 Iron deficiency anemia, unspecified: Secondary | ICD-10-CM | POA: Diagnosis not present

## 2022-01-18 DIAGNOSIS — Z6820 Body mass index (BMI) 20.0-20.9, adult: Secondary | ICD-10-CM | POA: Diagnosis not present

## 2022-01-18 DIAGNOSIS — K921 Melena: Secondary | ICD-10-CM | POA: Diagnosis not present

## 2022-01-18 DIAGNOSIS — Z79899 Other long term (current) drug therapy: Secondary | ICD-10-CM | POA: Diagnosis not present

## 2022-01-18 DIAGNOSIS — D500 Iron deficiency anemia secondary to blood loss (chronic): Secondary | ICD-10-CM | POA: Diagnosis not present

## 2022-01-18 DIAGNOSIS — R64 Cachexia: Secondary | ICD-10-CM | POA: Diagnosis present

## 2022-01-18 DIAGNOSIS — I255 Ischemic cardiomyopathy: Secondary | ICD-10-CM | POA: Diagnosis not present

## 2022-01-18 DIAGNOSIS — D62 Acute posthemorrhagic anemia: Secondary | ICD-10-CM | POA: Diagnosis present

## 2022-01-18 DIAGNOSIS — K279 Peptic ulcer, site unspecified, unspecified as acute or chronic, without hemorrhage or perforation: Secondary | ICD-10-CM | POA: Diagnosis not present

## 2022-01-18 DIAGNOSIS — K254 Chronic or unspecified gastric ulcer with hemorrhage: Principal | ICD-10-CM | POA: Diagnosis present

## 2022-01-18 DIAGNOSIS — K922 Gastrointestinal hemorrhage, unspecified: Secondary | ICD-10-CM | POA: Diagnosis present

## 2022-01-18 DIAGNOSIS — I723 Aneurysm of iliac artery: Secondary | ICD-10-CM | POA: Diagnosis present

## 2022-01-18 DIAGNOSIS — K31819 Angiodysplasia of stomach and duodenum without bleeding: Secondary | ICD-10-CM

## 2022-01-18 LAB — CBC WITH DIFFERENTIAL/PLATELET
Abs Immature Granulocytes: 0.02 10*3/uL (ref 0.00–0.07)
Basophils Absolute: 0 10*3/uL (ref 0.0–0.1)
Basophils Relative: 0 %
Eosinophils Absolute: 0 10*3/uL (ref 0.0–0.5)
Eosinophils Relative: 1 %
HCT: 14.6 % — ABNORMAL LOW (ref 39.0–52.0)
Hemoglobin: 4.4 g/dL — CL (ref 13.0–17.0)
Immature Granulocytes: 0 %
Lymphocytes Relative: 16 %
Lymphs Abs: 0.8 10*3/uL (ref 0.7–4.0)
MCH: 25.3 pg — ABNORMAL LOW (ref 26.0–34.0)
MCHC: 30.1 g/dL (ref 30.0–36.0)
MCV: 83.9 fL (ref 80.0–100.0)
Monocytes Absolute: 0.4 10*3/uL (ref 0.1–1.0)
Monocytes Relative: 9 %
Neutro Abs: 3.5 10*3/uL (ref 1.7–7.7)
Neutrophils Relative %: 74 %
Platelets: 80 10*3/uL — ABNORMAL LOW (ref 150–400)
RBC: 1.74 MIL/uL — ABNORMAL LOW (ref 4.22–5.81)
RDW: 18.5 % — ABNORMAL HIGH (ref 11.5–15.5)
WBC: 4.8 10*3/uL (ref 4.0–10.5)
nRBC: 0 % (ref 0.0–0.2)

## 2022-01-18 LAB — BASIC METABOLIC PANEL
Anion gap: 10 (ref 5–15)
Anion gap: 12 (ref 5–15)
BUN: 18 mg/dL (ref 8–23)
BUN: 18 mg/dL (ref 8–23)
CO2: 21 mmol/L — ABNORMAL LOW (ref 22–32)
CO2: 24 mmol/L (ref 22–32)
Calcium: 8.7 mg/dL — ABNORMAL LOW (ref 8.9–10.3)
Calcium: 8.9 mg/dL (ref 8.9–10.3)
Chloride: 106 mmol/L (ref 98–111)
Chloride: 102 mmol/L (ref 98–111)
Creatinine, Ser: 0.99 mg/dL (ref 0.61–1.24)
Creatinine, Ser: 1.21 mg/dL (ref 0.61–1.24)
GFR, Estimated: >60 mL/min (ref >60)
GFR, Estimated: >60 mL/min (ref >60)
Glucose, Bld: 120 mg/dL — ABNORMAL HIGH (ref 70–99)
Glucose, Bld: 172 mg/dL — ABNORMAL HIGH (ref 70–99)
Potassium: 3.9 mmol/L (ref 3.5–5.1)
Potassium: 3.6 mmol/L (ref 3.5–5.1)
Sodium: 137 mmol/L (ref 135–145)
Sodium: 138 mmol/L (ref 135–145)

## 2022-01-18 LAB — I-STAT ARTERIAL BLOOD GAS, ED
Acid-base deficit: 1 mmol/L (ref 0.0–2.0)
Bicarbonate: 23.1 mmol/L (ref 20.0–28.0)
Calcium, Ion: 1.1 mmol/L — ABNORMAL LOW (ref 1.15–1.40)
HCT: 28 % — ABNORMAL LOW (ref 39.0–52.0)
Hemoglobin: 9.5 g/dL — ABNORMAL LOW (ref 13.0–17.0)
O2 Saturation: 92 %
Potassium: 3.9 mmol/L (ref 3.5–5.1)
Sodium: 140 mmol/L (ref 135–145)
TCO2: 24 mmol/L (ref 22–32)
pCO2 arterial: 36.4 mmHg (ref 32–48)
pH, Arterial: 7.41 (ref 7.35–7.45)
pO2, Arterial: 64 mmHg — ABNORMAL LOW (ref 83–108)

## 2022-01-18 LAB — RESP PANEL BY RT-PCR (FLU A&B, COVID) ARPGX2
Influenza A by PCR: NEGATIVE
Influenza B by PCR: NEGATIVE
SARS Coronavirus 2 by RT PCR: NEGATIVE

## 2022-01-18 LAB — CBC
HCT: 25.9 % — ABNORMAL LOW (ref 39.0–52.0)
Hemoglobin: 8.3 g/dL — ABNORMAL LOW (ref 13.0–17.0)
MCH: 25.2 pg — ABNORMAL LOW (ref 26.0–34.0)
MCHC: 32 g/dL (ref 30.0–36.0)
MCV: 78.7 fL — ABNORMAL LOW (ref 80.0–100.0)
Platelets: 80 10*3/uL — ABNORMAL LOW (ref 150–400)
RBC: 3.29 MIL/uL — ABNORMAL LOW (ref 4.22–5.81)
RDW: 19.1 % — ABNORMAL HIGH (ref 11.5–15.5)
WBC: 9.3 10*3/uL (ref 4.0–10.5)
nRBC: 0.3 % — ABNORMAL HIGH (ref 0.0–0.2)

## 2022-01-18 LAB — POC OCCULT BLOOD, ED: Fecal Occult Bld: POSITIVE — AB

## 2022-01-18 LAB — HEPATIC FUNCTION PANEL
ALT: 28 U/L (ref 0–44)
AST: 37 U/L (ref 15–41)
Albumin: 3.9 g/dL (ref 3.5–5.0)
Alkaline Phosphatase: 52 U/L (ref 38–126)
Bilirubin, Direct: 0.2 mg/dL (ref 0.0–0.2)
Indirect Bilirubin: 0.6 mg/dL (ref 0.3–0.9)
Total Bilirubin: 0.8 mg/dL (ref 0.3–1.2)
Total Protein: 7.9 g/dL (ref 6.5–8.1)

## 2022-01-18 LAB — TROPONIN I (HIGH SENSITIVITY)
Troponin I (High Sensitivity): 78 ng/L — ABNORMAL HIGH (ref <18)
Troponin I (High Sensitivity): 79 ng/L — ABNORMAL HIGH (ref <18)

## 2022-01-18 LAB — BRAIN NATRIURETIC PEPTIDE
B Natriuretic Peptide: 755.6 pg/mL — ABNORMAL HIGH (ref 0.0–100.0)
B Natriuretic Peptide: 1211.3 pg/mL — ABNORMAL HIGH (ref 0.0–100.0)

## 2022-01-18 LAB — MAGNESIUM: Magnesium: 1.9 mg/dL (ref 1.7–2.4)

## 2022-01-18 LAB — STREP PNEUMONIAE URINARY ANTIGEN: Strep Pneumo Urinary Antigen: NEGATIVE

## 2022-01-18 LAB — PREPARE RBC (CROSSMATCH)

## 2022-01-18 LAB — MRSA NEXT GEN BY PCR, NASAL: MRSA by PCR Next Gen: NOT DETECTED

## 2022-01-18 LAB — PROTIME-INR
INR: 1.3 — ABNORMAL HIGH (ref 0.8–1.2)
Prothrombin Time: 16.2 seconds — ABNORMAL HIGH (ref 11.4–15.2)

## 2022-01-18 LAB — PHOSPHORUS: Phosphorus: 3.7 mg/dL (ref 2.5–4.6)

## 2022-01-18 MED ORDER — FUROSEMIDE 10 MG/ML IJ SOLN
20.0000 mg | Freq: Once | INTRAMUSCULAR | Status: AC
  Administered 2022-01-18: 20 mg via INTRAVENOUS
  Filled 2022-01-18: qty 2

## 2022-01-18 MED ORDER — METHYLPREDNISOLONE SODIUM SUCC 40 MG IJ SOLR
40.0000 mg | Freq: Every day | INTRAMUSCULAR | Status: DC
  Filled 2022-01-18: qty 1

## 2022-01-18 MED ORDER — IPRATROPIUM-ALBUTEROL 0.5-2.5 (3) MG/3ML IN SOLN
3.0000 mL | Freq: Once | RESPIRATORY_TRACT | Status: AC
  Administered 2022-01-18: 3 mL via RESPIRATORY_TRACT
  Filled 2022-01-18: qty 3

## 2022-01-18 MED ORDER — HYDRALAZINE HCL 20 MG/ML IJ SOLN: 5.0000 mg | INTRAMUSCULAR | Status: DC | PRN

## 2022-01-18 MED ORDER — PANTOPRAZOLE 80MG IVPB - SIMPLE MED
80.0000 mg | Freq: Once | INTRAVENOUS | Status: AC
  Administered 2022-01-18: 80 mg via INTRAVENOUS
  Filled 2022-01-18: qty 100

## 2022-01-18 MED ORDER — CHLORHEXIDINE GLUCONATE CLOTH 2 % EX PADS
6.0000 | MEDICATED_PAD | Freq: Every day | CUTANEOUS | Status: DC
  Administered 2022-01-18 – 2022-01-25 (×8): 6 via TOPICAL

## 2022-01-18 MED ORDER — METHYLPREDNISOLONE SODIUM SUCC 125 MG IJ SOLR
125.0000 mg | Freq: Once | INTRAMUSCULAR | Status: AC
  Administered 2022-01-18: 125 mg via INTRAVENOUS
  Filled 2022-01-18: qty 2

## 2022-01-18 MED ORDER — PANTOPRAZOLE INFUSION (NEW) - SIMPLE MED
8.0000 mg/h | INTRAVENOUS | Status: DC
  Administered 2022-01-18 – 2022-01-19 (×3): 8 mg/h via INTRAVENOUS
  Filled 2022-01-18 (×3): qty 100

## 2022-01-18 MED ORDER — DIPHENHYDRAMINE HCL 50 MG/ML IJ SOLN
25.0000 mg | Freq: Once | INTRAMUSCULAR | Status: AC
  Administered 2022-01-18: 25 mg via INTRAVENOUS
  Filled 2022-01-18: qty 1

## 2022-01-18 MED ORDER — LACTATED RINGERS IV SOLN: INTRAVENOUS | Status: DC

## 2022-01-18 MED ORDER — FUROSEMIDE 10 MG/ML IJ SOLN
20.0000 mg | Freq: Four times a day (QID) | INTRAMUSCULAR | Status: DC
  Administered 2022-01-18: 20 mg via INTRAVENOUS
  Filled 2022-01-18 (×2): qty 2

## 2022-01-18 MED ORDER — SODIUM CHLORIDE 0.9% FLUSH
3.0000 mL | Freq: Two times a day (BID) | INTRAVENOUS | Status: DC
  Administered 2022-01-18 – 2022-01-26 (×14): 3 mL via INTRAVENOUS

## 2022-01-18 MED ORDER — ONDANSETRON HCL 4 MG PO TABS: 4.0000 mg | ORAL_TABLET | Freq: Four times a day (QID) | ORAL | Status: DC | PRN

## 2022-01-18 MED ORDER — IPRATROPIUM-ALBUTEROL 0.5-2.5 (3) MG/3ML IN SOLN
3.0000 mL | Freq: Four times a day (QID) | RESPIRATORY_TRACT | Status: DC
  Administered 2022-01-18 – 2022-01-20 (×7): 3 mL via RESPIRATORY_TRACT
  Filled 2022-01-18 (×7): qty 3

## 2022-01-18 MED ORDER — BUDESONIDE 0.5 MG/2ML IN SUSP
0.5000 mg | Freq: Two times a day (BID) | RESPIRATORY_TRACT | Status: DC
  Administered 2022-01-18 – 2022-01-21 (×6): 0.5 mg via RESPIRATORY_TRACT
  Filled 2022-01-18 (×6): qty 2

## 2022-01-18 MED ORDER — PANTOPRAZOLE SODIUM 40 MG IV SOLR: 40.0000 mg | Freq: Two times a day (BID) | INTRAVENOUS | Status: DC

## 2022-01-18 MED ORDER — ALBUTEROL SULFATE (2.5 MG/3ML) 0.083% IN NEBU
2.5000 mg | INHALATION_SOLUTION | Freq: Once | RESPIRATORY_TRACT | Status: AC
  Administered 2022-01-18: 2.5 mg via RESPIRATORY_TRACT
  Filled 2022-01-18: qty 3

## 2022-01-18 MED ORDER — ALBUTEROL SULFATE (2.5 MG/3ML) 0.083% IN NEBU: 2.5000 mg | INHALATION_SOLUTION | RESPIRATORY_TRACT | Status: DC | PRN

## 2022-01-18 MED ORDER — ACETAMINOPHEN 650 MG RE SUPP: 650.0000 mg | Freq: Four times a day (QID) | RECTAL | Status: DC | PRN

## 2022-01-18 MED ORDER — ONDANSETRON HCL 4 MG/2ML IJ SOLN: 4.0000 mg | Freq: Four times a day (QID) | INTRAMUSCULAR | Status: DC | PRN

## 2022-01-18 MED ORDER — SODIUM CHLORIDE 0.9 % IV SOLN: 10.0000 mL/h | Freq: Once | INTRAVENOUS | Status: DC

## 2022-01-18 MED ORDER — IPRATROPIUM-ALBUTEROL 0.5-2.5 (3) MG/3ML IN SOLN
3.0000 mL | Freq: Once | RESPIRATORY_TRACT | Status: DC
  Administered 2022-01-18: 3 mL via RESPIRATORY_TRACT
  Filled 2022-01-18: qty 3

## 2022-01-18 MED ORDER — NICOTINE 14 MG/24HR TD PT24
14.0000 mg | MEDICATED_PATCH | Freq: Every day | TRANSDERMAL | Status: DC
  Administered 2022-01-18 – 2022-01-26 (×9): 14 mg via TRANSDERMAL
  Filled 2022-01-18 (×9): qty 1

## 2022-01-18 MED ORDER — ACETAMINOPHEN 325 MG PO TABS
650.0000 mg | ORAL_TABLET | Freq: Four times a day (QID) | ORAL | Status: DC | PRN
  Administered 2022-01-23 – 2022-01-25 (×3): 650 mg via ORAL
  Filled 2022-01-18 (×4): qty 2

## 2022-01-18 MED ORDER — ATORVASTATIN CALCIUM 40 MG PO TABS
40.0000 mg | ORAL_TABLET | Freq: Every day | ORAL | Status: DC
  Administered 2022-01-19 – 2022-01-26 (×8): 40 mg via ORAL
  Filled 2022-01-18 (×8): qty 1

## 2022-01-18 MED ORDER — MORPHINE SULFATE (PF) 2 MG/ML IV SOLN: 2.0000 mg | INTRAVENOUS | Status: DC | PRN

## 2022-01-18 NOTE — ED Notes (Signed)
Rate changed to 200 mL/hr

## 2022-01-18 NOTE — Consult Note (Addendum)
Referring Provider: Dr. Ronnald Nian, EDP Primary Care Physician:  Pcp, No Primary Gastroenterologist:  Dr. Rush Landmark  Reason for Consultation:  Anemia  HPI: Greg [name] is a 71 y.o. male with history of coronary artery disease, large left inguinal hernia for which he has declined surgery, AAA.  Was admitted in November for profound iron deficiency anemia.  Underwent EGD as below, but refused colonoscopy.  Plan was for repeat EGD in 8 weeks to assess healing of gastric ulcer.  Now presenting back here again on this occasion with complaints of shortness of breath and weakness, not able to get up to go to the bathroom.  Found to have hemoglobin of 4.4 g down from 10.4 g was 3 weeks ago.  Is Hemoccult positive.  BUN is normal.  Says that his stools have been dark again for a couple of weeks.  No red blood in stools.  No abdominal pain.  Is receiving 2 units of PRBCs.  EGD 12/2021:  - Esophageal mucosal changes suspicious for short-segment Barrett's esophagus. Biopsied. - Non-bleeding gastric ulcer with no stigmata of bleeding. Biopsied. - Mucosal changes in the duodenum. Biopsied.  A. SMALL BOWEL, BIOPSY:  Reactive duodenal mucosa with focal gastric metaplasia compatible with  peptic duodenitis   B. STOMACH, ANTRUM, BODY, FUNDUS, BIOPSY:  Reactive gastropathy with focal surface erosion  Negative for H. pylori, intestinal metaplasia, dysplasia and carcinoma   C. DISTAL ESOPHAGUS, BIOPSY:  Reactive squamous mucosa  Mild chronic gastritis with focal intestinal metaplasia  Negative for dysplasia and carcinoma (see comment)   COMMENT:  The changes seen within the distal esophageal biopsies would be  compatible with a diagnosis of Barrett's esophagus depending on the  relationship to the Z-line.  Clinical and endoscopic correlation is  recommended.    Past Medical History:  Diagnosis Date   Barrett's esophagus    Gastric ulcer     Past Surgical History:  Procedure Laterality Date    BIOPSY  12/20/2021   Procedure: BIOPSY;  Surgeon: Thornton Park, MD;  Location: [facility];  Service: Gastroenterology;;   ESOPHAGOGASTRODUODENOSCOPY (EGD) WITH PROPOFOL N/A 12/20/2021   Procedure: ESOPHAGOGASTRODUODENOSCOPY (EGD) WITH PROPOFOL;  Surgeon: Thornton Park, MD;  Location: [facility];  Service: Gastroenterology;  Laterality: N/A;    Prior to Admission medications   Medication Sig Start Date End Date Taking? Authorizing Provider  acetaminophen (TYLENOL) 325 MG tablet Take 2 tablets (650 mg total) by mouth every 6 (six) hours as needed for mild pain (or Fever >/= 101). 12/24/21  Yes Eugenie Filler, MD  aspirin EC 81 MG tablet Take 1 tablet (81 mg total) by mouth daily. Swallow whole. 12/25/21  Yes Eugenie Filler, MD  atorvastatin (LIPITOR) 40 MG tablet Take 1 tablet (40 mg total) by mouth daily. 12/25/21  Yes Eugenie Filler, MD  lisinopril (ZESTRIL) 5 MG tablet Take 5 mg by mouth daily. 01/08/22  Yes [provider]  losartan (COZAAR) 50 MG tablet Take 1 tablet (50 mg total) by mouth daily. 12/25/21  Yes Eugenie Filler, MD  OVER THE COUNTER MEDICATION Over the counter pain relief   Yes [provider]  pantoprazole (PROTONIX) 40 MG tablet Take 1 tablet (40 mg total) by mouth 2 (two) times daily. 12/24/21  Yes Eugenie Filler, MD    Current Facility-Administered Medications  Medication Dose Route Frequency Provider Last Rate Last Admin   0.9 %  sodium chloride infusion  10 mL/hr Intravenous Once Pollina, Gwenyth Allegra, MD       [  START ON 01/21/2022] pantoprazole (PROTONIX) injection 40 mg  40 mg Intravenous Q12H Pollina, Gwenyth Allegra, MD       pantoprozole (PROTONIX) 80 mg /NS 100 mL infusion  8 mg/hr Intravenous Continuous Orpah Greek, MD 10 mL/hr at 01/18/22 0844 8 mg/hr at 01/18/22 0844   Current Outpatient Medications  Medication Sig Dispense Refill   acetaminophen (TYLENOL) 325 MG tablet Take 2 tablets (650 mg total)  by mouth every 6 (six) hours as needed for mild pain (or Fever >/= 101).     aspirin EC 81 MG tablet Take 1 tablet (81 mg total) by mouth daily. Swallow whole. 30 tablet 1   atorvastatin (LIPITOR) 40 MG tablet Take 1 tablet (40 mg total) by mouth daily. 30 tablet 1   lisinopril (ZESTRIL) 5 MG tablet Take 5 mg by mouth daily.     losartan (COZAAR) 50 MG tablet Take 1 tablet (50 mg total) by mouth daily. 30 tablet 1   OVER THE COUNTER MEDICATION Over the counter pain relief     pantoprazole (PROTONIX) 40 MG tablet Take 1 tablet (40 mg total) by mouth 2 (two) times daily. 60 tablet 2    Allergies as of 01/18/2022   (No Known Allergies)    Family History  Problem Relation Age of Onset   Stomach cancer Neg Hx    Colon cancer Neg Hx    Esophageal cancer Neg Hx    Pancreatic cancer Neg Hx     Social History   Socioeconomic History   Marital status: Legally Separated    Spouse name: Not on file   Number of children: Not on file   Years of education: Not on file   Highest education level: Not on file  Occupational History   Not on file  Tobacco Use   Smoking status: Every Day    Packs/day: 1.00    Types: Cigarettes   Smokeless tobacco: Never  Vaping Use   Vaping Use: Never used  Substance and Sexual Activity   Alcohol use: Not Currently    Comment: former   Drug use: Never   Sexual activity: Not on file  Other Topics Concern   Not on file  Social History Narrative   Not on file   Social Determinants of Health   Financial Resource Strain: Not on file  Food Insecurity: Not on file  Transportation Needs: Not on file  Physical Activity: Not on file  Stress: Not on file  Social Connections: Not on file  Intimate Partner Violence: Not on file    Review of Systems: ROS is O/W negative except as mentioned in HPI.  Physical Exam: Vital signs in last 24 hours: Temp:  [97.6 F (36.4 C)] 97.6 F (36.4 C) (12/08 0553) Pulse Rate:  [98-119] 113 (12/08 0902) Resp:  [16-26]  26 (12/08 0902) BP: (97-111)/(63-77) 111/63 (12/08 0902) SpO2:  [100 %] 100 % (12/08 0902) Weight:  [61.2 kg] 61.2 kg (12/08 0554)   General:  Alert, chronically ill-appearing, pleasant and cooperative in NAD Head:  Normocephalic and atraumatic. Eyes:  Sclera clear, no icterus.  Conjunctiva pale. Ears:  Normal auditory acuity. Mouth:  Poor dentition.   Lungs:  Clear throughout to auscultation.   No wheezes, crackles, or rhonchi.  Heart:  Regular rate and rhythm; no murmurs, clicks, rubs, or gallops. Abdomen:  Soft, non-distended.  BS present.  Non-tender.   Rectal:  Deferred.  Heme positive by ED.  Msk:  Symmetrical without gross deformities. Pulses:  Normal pulses noted.  Extremities:  Some B/L LE edema. Neurologic:  Alert and  oriented x4;  grossly normal neurologically. Skin:  Bilateral lower extremities with excessive skin peeling.  Psych:  Alert and cooperative. Normal mood and affect.  Intake/Output this shift: Total I/O In: 100 [IV Piggyback:100] Out: -   Lab Results: Recent Labs    01/18/22 0647  WBC 4.8  HGB 4.4*  HCT 14.6*  PLT 80*   BMET Recent Labs    01/18/22 0606  NA 137  K 3.9  CL 106  CO2 21*  GLUCOSE 120*  BUN 18  CREATININE 0.99  CALCIUM 8.7*   Studies/Results: DG Chest Port 1 View  Result Date: 01/18/2022 CLINICAL DATA:  Shortness of breath. EXAM: PORTABLE CHEST 1 VIEW COMPARISON:  12/18/2021 FINDINGS: 0608 hours. Lungs are hyperexpanded. The lungs are clear without focal pneumonia, edema, pneumothorax or pleural effusion. Cardiopericardial silhouette is at upper limits of normal for size. The visualized bony structures of the thorax are unremarkable. Telemetry leads overlie the chest. IMPRESSION: Hyperexpansion without acute cardiopulmonary findings. Electronically Signed   By: Misty Stanley M.D.   On: 01/18/2022 06:22    IMPRESSION:  83) 71 year old male admitted to the hospital with profound iron deficiency anemia reported dark stools with  positive FOBT.   Had non-bleeding GU on EGD when see in November for the same reason.  2) History of coronary artery disease, ? past anterior/septal MI per EKG. Elevated troponin levels, NSTEMI vs demand ischemia in setting of profound anemia.  Echo abnormal.    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  Patient was previously evaluated by general surgery and he declined surgical intervention.   5) Chronic tobacco use  PLAN: -EGD 12/9.  If negative may need colonoscopy and actually seems that he may agree to it this time if needed. -PPI gtt ok for now, although this is likely a subacute bleed with dark stools for a couple of weeks. -Trend labs and transfuse further prn.   Janett Billow D. Zehr  01/18/2022, 9:28 AM   Attending physician's note  I have taken a history, reviewed the chart and examined the patient. I performed a substantive portion of this encounter, including complete performance of at least one of the key components, in conjunction with the APP. I agree with the APP's note, impression and recommendations.     71 year old male with multiple comorbidities, AAA, recent hospitalization with NSTEMI, GI bleed secondary to gastroduodenal ulcer presented with severe symptomatic anemia and melena  His presentation is concerning for recurrent upper GI bleed, will schedule for EGD for evaluation and therapeutic intervention if needed N.p.o. IV PPI gtt. IV fluids  Noted elevation in troponin, possible demand ischemia, defer to primary team and cardiology  If EGD is unrevealing for source of GI bleed, will consider colonoscopy for further evaluation  The risks and benefits as well as alternatives of endoscopic procedure(s) have been discussed and reviewed. All questions answered. The patient agrees to proceed.    The patient was provided an opportunity to ask questions  and all were answered. The patient agreed with the plan and demonstrated an understanding of the instructions.  Damaris Hippo , MD [phone]

## 2022-01-18 NOTE — Progress Notes (Signed)
   01/18/22 1620  BiPAP/CPAP/SIPAP  BiPAP/CPAP/SIPAP Pt Type Adult  Mask Type Full face mask  Mask Size Medium  Set Rate 12 breaths/min  Respiratory Rate 30 breaths/min  IPAP 12 cmH20  EPAP 6 cmH2O  FiO2 (%) (S)  40 %   Pt's RR >40 and was showing signs of accessory muscle usage. Exp. wheezing was heard in all bases, Pt received duo nebulizer with no relief. Pt was placed on BiPAP on above settings due to increased WOB. Pt states breathing is better and is resting comfortably now. MD aware.

## 2022-01-18 NOTE — Plan of Care (Signed)

## 2022-01-18 NOTE — Progress Notes (Signed)
Update:  Patient became SOB during the completion of his 2nd unit of blood.  He had been SOB on presentation and was treated with nebs, but had been feeling better.  Lungs were basically clear but he was given Lasix after the completion of the 2nd unit  the 3rd unit has not been started.  Meanwhile, he became increasingly SOB and diaphoretic with tachycardia/tachypnea.  He was given nebs but given his progressive symptoms he was placed on BIPAP.  Will change to progressive care for now.  He is a smoker and so COPD is also a consideration (in addition to volume overload as well as transfusion reaction/TRALI).  CXR with "diffuse interstitial prominence" noted.  I have also asked PCCM to evaluate the patient.   He is cachectic and frail-appearing at baseline and likely does not have a lot of reserve.   Carlyon Shadow, M.D.

## 2022-01-18 NOTE — H&P (View-Only) (Signed)
Referring Provider: Dr. Ronnald Nian, EDP Primary Care Physician:  Pcp, No Primary Gastroenterologist:  Dr. Rush Landmark  Reason for Consultation:  Anemia  HPI: Greg [name] is a 71 y.o. male with history of coronary artery disease, large left inguinal hernia for which he has declined surgery, AAA.  Was admitted in November for profound iron deficiency anemia.  Underwent EGD as below, but refused colonoscopy.  Plan was for repeat EGD in 8 weeks to assess healing of gastric ulcer.  Now presenting back here again on this occasion with complaints of shortness of breath and weakness, not able to get up to go to the bathroom.  Found to have hemoglobin of 4.4 g down from 10.4 g was 3 weeks ago.  Is Hemoccult positive.  BUN is normal.  Says that his stools have been dark again for a couple of weeks.  No red blood in stools.  No abdominal pain.  Is receiving 2 units of PRBCs.  EGD 12/2021:  - Esophageal mucosal changes suspicious for short-segment Barrett's esophagus. Biopsied. - Non-bleeding gastric ulcer with no stigmata of bleeding. Biopsied. - Mucosal changes in the duodenum. Biopsied.  A. SMALL BOWEL, BIOPSY:  Reactive duodenal mucosa with focal gastric metaplasia compatible with  peptic duodenitis   B. STOMACH, ANTRUM, BODY, FUNDUS, BIOPSY:  Reactive gastropathy with focal surface erosion  Negative for H. pylori, intestinal metaplasia, dysplasia and carcinoma   C. DISTAL ESOPHAGUS, BIOPSY:  Reactive squamous mucosa  Mild chronic gastritis with focal intestinal metaplasia  Negative for dysplasia and carcinoma (see comment)   COMMENT:  The changes seen within the distal esophageal biopsies would be  compatible with a diagnosis of Barrett's esophagus depending on the  relationship to the Z-line.  Clinical and endoscopic correlation is  recommended.    Past Medical History:  Diagnosis Date   Barrett's esophagus    Gastric ulcer     Past Surgical History:  Procedure Laterality Date    BIOPSY  12/20/2021   Procedure: BIOPSY;  Surgeon: Thornton Park, MD;  Location: [facility];  Service: Gastroenterology;;   ESOPHAGOGASTRODUODENOSCOPY (EGD) WITH PROPOFOL N/A 12/20/2021   Procedure: ESOPHAGOGASTRODUODENOSCOPY (EGD) WITH PROPOFOL;  Surgeon: Thornton Park, MD;  Location: [facility];  Service: Gastroenterology;  Laterality: N/A;    Prior to Admission medications   Medication Sig Start Date End Date Taking? Authorizing Provider  acetaminophen (TYLENOL) 325 MG tablet Take 2 tablets (650 mg total) by mouth every 6 (six) hours as needed for mild pain (or Fever >/= 101). 12/24/21  Yes Eugenie Filler, MD  aspirin EC 81 MG tablet Take 1 tablet (81 mg total) by mouth daily. Swallow whole. 12/25/21  Yes Eugenie Filler, MD  atorvastatin (LIPITOR) 40 MG tablet Take 1 tablet (40 mg total) by mouth daily. 12/25/21  Yes Eugenie Filler, MD  lisinopril (ZESTRIL) 5 MG tablet Take 5 mg by mouth daily. 01/08/22  Yes [provider]  losartan (COZAAR) 50 MG tablet Take 1 tablet (50 mg total) by mouth daily. 12/25/21  Yes Eugenie Filler, MD  OVER THE COUNTER MEDICATION Over the counter pain relief   Yes [provider]  pantoprazole (PROTONIX) 40 MG tablet Take 1 tablet (40 mg total) by mouth 2 (two) times daily. 12/24/21  Yes Eugenie Filler, MD    Current Facility-Administered Medications  Medication Dose Route Frequency Provider Last Rate Last Admin   0.9 %  sodium chloride infusion  10 mL/hr Intravenous Once Pollina, Gwenyth Allegra, MD       [  START ON 01/21/2022] pantoprazole (PROTONIX) injection 40 mg  40 mg Intravenous Q12H Pollina, Gwenyth Allegra, MD       pantoprozole (PROTONIX) 80 mg /NS 100 mL infusion  8 mg/hr Intravenous Continuous Orpah Greek, MD 10 mL/hr at 01/18/22 0844 8 mg/hr at 01/18/22 0844   Current Outpatient Medications  Medication Sig Dispense Refill   acetaminophen (TYLENOL) 325 MG tablet Take 2 tablets (650 mg total)  by mouth every 6 (six) hours as needed for mild pain (or Fever >/= 101).     aspirin EC 81 MG tablet Take 1 tablet (81 mg total) by mouth daily. Swallow whole. 30 tablet 1   atorvastatin (LIPITOR) 40 MG tablet Take 1 tablet (40 mg total) by mouth daily. 30 tablet 1   lisinopril (ZESTRIL) 5 MG tablet Take 5 mg by mouth daily.     losartan (COZAAR) 50 MG tablet Take 1 tablet (50 mg total) by mouth daily. 30 tablet 1   OVER THE COUNTER MEDICATION Over the counter pain relief     pantoprazole (PROTONIX) 40 MG tablet Take 1 tablet (40 mg total) by mouth 2 (two) times daily. 60 tablet 2    Allergies as of 01/18/2022   (No Known Allergies)    Family History  Problem Relation Age of Onset   Stomach cancer Neg Hx    Colon cancer Neg Hx    Esophageal cancer Neg Hx    Pancreatic cancer Neg Hx     Social History   Socioeconomic History   Marital status: Legally Separated    Spouse name: Not on file   Number of children: Not on file   Years of education: Not on file   Highest education level: Not on file  Occupational History   Not on file  Tobacco Use   Smoking status: Every Day    Packs/day: 1.00    Types: Cigarettes   Smokeless tobacco: Never  Vaping Use   Vaping Use: Never used  Substance and Sexual Activity   Alcohol use: Not Currently    Comment: former   Drug use: Never   Sexual activity: Not on file  Other Topics Concern   Not on file  Social History Narrative   Not on file   Social Determinants of Health   Financial Resource Strain: Not on file  Food Insecurity: Not on file  Transportation Needs: Not on file  Physical Activity: Not on file  Stress: Not on file  Social Connections: Not on file  Intimate Partner Violence: Not on file    Review of Systems: ROS is O/W negative except as mentioned in HPI.  Physical Exam: Vital signs in last 24 hours: Temp:  [97.6 F (36.4 C)] 97.6 F (36.4 C) (12/08 0553) Pulse Rate:  [98-119] 113 (12/08 0902) Resp:  [16-26]  26 (12/08 0902) BP: (97-111)/(63-77) 111/63 (12/08 0902) SpO2:  [100 %] 100 % (12/08 0902) Weight:  [61.2 kg] 61.2 kg (12/08 0554)   General:  Alert, chronically ill-appearing, pleasant and cooperative in NAD Head:  Normocephalic and atraumatic. Eyes:  Sclera clear, no icterus.  Conjunctiva pale. Ears:  Normal auditory acuity. Mouth:  Poor dentition.   Lungs:  Clear throughout to auscultation.   No wheezes, crackles, or rhonchi.  Heart:  Regular rate and rhythm; no murmurs, clicks, rubs, or gallops. Abdomen:  Soft, non-distended.  BS present.  Non-tender.   Rectal:  Deferred.  Heme positive by ED.  Msk:  Symmetrical without gross deformities. Pulses:  Normal pulses noted.  Extremities:  Some B/L LE edema. Neurologic:  Alert and  oriented x4;  grossly normal neurologically. Skin:  Bilateral lower extremities with excessive skin peeling.  Psych:  Alert and cooperative. Normal mood and affect.  Intake/Output this shift: Total I/O In: 100 [IV Piggyback:100] Out: -   Lab Results: Recent Labs    01/18/22 0647  WBC 4.8  HGB 4.4*  HCT 14.6*  PLT 80*   BMET Recent Labs    01/18/22 0606  NA 137  K 3.9  CL 106  CO2 21*  GLUCOSE 120*  BUN 18  CREATININE 0.99  CALCIUM 8.7*   Studies/Results: DG Chest Port 1 View  Result Date: 01/18/2022 CLINICAL DATA:  Shortness of breath. EXAM: PORTABLE CHEST 1 VIEW COMPARISON:  12/18/2021 FINDINGS: 0608 hours. Lungs are hyperexpanded. The lungs are clear without focal pneumonia, edema, pneumothorax or pleural effusion. Cardiopericardial silhouette is at upper limits of normal for size. The visualized bony structures of the thorax are unremarkable. Telemetry leads overlie the chest. IMPRESSION: Hyperexpansion without acute cardiopulmonary findings. Electronically Signed   By: Misty Stanley M.D.   On: 01/18/2022 06:22    IMPRESSION:  35) 71 year old male admitted to the hospital with profound iron deficiency anemia reported dark stools with  positive FOBT.   Had non-bleeding GU on EGD when see in November for the same reason.  2) History of coronary artery disease, ? past anterior/septal MI per EKG. Elevated troponin levels, NSTEMI vs demand ischemia in setting of profound anemia.  Echo abnormal.    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  Patient was previously evaluated by general surgery and he declined surgical intervention.   5) Chronic tobacco use  PLAN: -EGD 12/9.  If negative may need colonoscopy and actually seems that he may agree to it this time if needed. -PPI gtt ok for now, although this is likely a subacute bleed with dark stools for a couple of weeks. -Trend labs and transfuse further prn.   Janett Billow D. Zehr  01/18/2022, 9:28 AM   Attending physician's note  I have taken a history, reviewed the chart and examined the patient. I performed a substantive portion of this encounter, including complete performance of at least one of the key components, in conjunction with the APP. I agree with the APP's note, impression and recommendations.     71 year old male with multiple comorbidities, AAA, recent hospitalization with NSTEMI, GI bleed secondary to gastroduodenal ulcer presented with severe symptomatic anemia and melena  His presentation is concerning for recurrent upper GI bleed, will schedule for EGD for evaluation and therapeutic intervention if needed N.p.o. IV PPI gtt. IV fluids  Noted elevation in troponin, possible demand ischemia, defer to primary team and cardiology  If EGD is unrevealing for source of GI bleed, will consider colonoscopy for further evaluation  The risks and benefits as well as alternatives of endoscopic procedure(s) have been discussed and reviewed. All questions answered. The patient agrees to proceed.    The patient was provided an opportunity to ask questions  and all were answered. The patient agreed with the plan and demonstrated an understanding of the instructions.  Damaris Hippo , MD [phone]

## 2022-01-18 NOTE — ED Provider Notes (Signed)
[facility] EMERGENCY DEPARTMENT Provider Note   CSN: [id number] Arrival date & time: 01/18/22  0546     History  Chief Complaint  Patient presents with   Shortness of Breath    Patient BIB [facility] from home with complaint of SOB and chest pain. Patient reports having chest pain x 14 years but had SOB tonight.     Greg [name] is a 71 y.o. male.  Presents to the emergency department for evaluation of shortness of breath.  Patient reports that he is chronically short of breath but tonight symptoms worsen.  He has had a nonproductive cough.  He does smoke cigarettes.  He denies any previous diagnosis of COPD.  Patient has not had associated chest pain.  He reports that he was having trouble getting around his home today because movement made him more short of breath.       Home Medications Prior to Admission medications   Medication Sig Start Date End Date Taking? Authorizing Provider  acetaminophen (TYLENOL) 325 MG tablet Take 2 tablets (650 mg total) by mouth every 6 (six) hours as needed for mild pain (or Fever >/= 101). 12/24/21   Eugenie Filler, MD  aspirin EC 81 MG tablet Take 1 tablet (81 mg total) by mouth daily. Swallow whole. 12/25/21   Eugenie Filler, MD  atorvastatin (LIPITOR) 40 MG tablet Take 1 tablet (40 mg total) by mouth daily. 12/25/21   Eugenie Filler, MD  losartan (COZAAR) 50 MG tablet Take 1 tablet (50 mg total) by mouth daily. 12/25/21   Eugenie Filler, MD  OVER THE COUNTER MEDICATION Over the counter pain relief    [provider]  pantoprazole (PROTONIX) 40 MG tablet Take 1 tablet (40 mg total) by mouth 2 (two) times daily. 12/24/21   Eugenie Filler, MD      Allergies    Patient has no known allergies.    Review of Systems   Review of Systems  Physical Exam Updated Vital Signs BP 103/73 (BP Location: Right Arm)   Pulse (!) 104   Temp 97.6 F (36.4 C) (Oral)   Resp (!) 22   Ht '5\' 8"'$  (1.727 m)   Wt 61.2  kg   SpO2 100%   BMI 20.53 kg/m  Physical Exam Vitals and nursing note reviewed. Exam conducted with a chaperone present.  Constitutional:      General: He is not in acute distress.    Appearance: He is well-developed.  HENT:     Head: Normocephalic and atraumatic.     Mouth/Throat:     Mouth: Mucous membranes are moist.  Eyes:     General: Vision grossly intact. Gaze aligned appropriately.     Extraocular Movements: Extraocular movements intact.     Conjunctiva/sclera: Conjunctivae normal.  Cardiovascular:     Rate and Rhythm: Normal rate and regular rhythm.     Pulses: Normal pulses.     Heart sounds: Normal heart sounds, S1 normal and S2 normal. No murmur heard.    No friction rub. No gallop.  Pulmonary:     Effort: Tachypnea and accessory muscle usage present. No respiratory distress.     Breath sounds: Decreased breath sounds present.  Abdominal:     Palpations: Abdomen is soft.     Tenderness: There is no abdominal tenderness. There is no guarding or rebound.     Hernia: No hernia is present.  Genitourinary:    Comments: Massive scrotum, likely hernia, possibly hydrocele Musculoskeletal:  General: No swelling.     Cervical back: Full passive range of motion without pain, normal range of motion and neck supple. No pain with movement, spinous process tenderness or muscular tenderness. Normal range of motion.     Right lower leg: No edema.     Left lower leg: No edema.  Skin:    General: Skin is warm and dry.     Capillary Refill: Capillary refill takes less than 2 seconds.     Findings: No ecchymosis, erythema, lesion or wound.  Neurological:     Mental Status: He is alert and oriented to person, place, and time.     GCS: GCS eye subscore is 4. GCS verbal subscore is 5. GCS motor subscore is 6.     Cranial Nerves: Cranial nerves 2-12 are intact.     Sensory: Sensation is intact.     Motor: Motor function is intact. No weakness or abnormal muscle tone.      Coordination: Coordination is intact.  Psychiatric:        Mood and Affect: Mood normal.        Speech: Speech normal.        Behavior: Behavior normal.     ED Results / Procedures / Treatments   Labs (all labs ordered are listed, but only abnormal results are displayed) Labs Reviewed  BASIC METABOLIC PANEL - Abnormal; Notable for the following components:      Result Value   CO2 21 (*)    Glucose, Bld 120 (*)    Calcium 8.7 (*)    All other components within normal limits  TROPONIN I (HIGH SENSITIVITY) - Abnormal; Notable for the following components:   Troponin I (High Sensitivity) 78 (*)    All other components within normal limits  RESP PANEL BY RT-PCR (FLU A&B, COVID) ARPGX2  CBC WITH DIFFERENTIAL/PLATELET  BRAIN NATRIURETIC PEPTIDE  CBC WITH DIFFERENTIAL/PLATELET  POC OCCULT BLOOD, ED    EKG EKG Interpretation  Date/Time:  Friday January 18 2022 05:53:53 EST Ventricular Rate:  105 PR Interval:  120 QRS Duration: 85 QT Interval:  368 QTC Calculation: 487 R Axis:   80 Text Interpretation: Sinus tachycardia Low voltage, precordial leads Anteroseptal infarct, old Minimal ST depression, inferior leads Confirmed by Orpah Greek [phone]) on 01/18/2022 6:35:52 AM  Radiology DG Chest Port 1 View  Result Date: 01/18/2022 CLINICAL DATA:  Shortness of breath. EXAM: PORTABLE CHEST 1 VIEW COMPARISON:  12/18/2021 FINDINGS: 0608 hours. Lungs are hyperexpanded. The lungs are clear without focal pneumonia, edema, pneumothorax or pleural effusion. Cardiopericardial silhouette is at upper limits of normal for size. The visualized bony structures of the thorax are unremarkable. Telemetry leads overlie the chest. IMPRESSION: Hyperexpansion without acute cardiopulmonary findings. Electronically Signed   By: Misty Stanley M.D.   On: 01/18/2022 06:22    Procedures Procedures    Medications Ordered in ED Medications  pantoprazole (PROTONIX) 80 mg /NS 100 mL IVPB (has no  administration in time range)  pantoprozole (PROTONIX) 80 mg /NS 100 mL infusion (has no administration in time range)  pantoprazole (PROTONIX) injection 40 mg (has no administration in time range)  ipratropium-albuterol (DUONEB) 0.5-2.5 (3) MG/3ML nebulizer solution 3 mL (3 mLs Nebulization Given 01/18/22 0619)  albuterol (PROVENTIL) (2.5 MG/3ML) 0.083% nebulizer solution 2.5 mg (2.5 mg Nebulization Given 01/18/22 2563)    ED Course/ Medical Decision Making/ A&P  Medical Decision Making Amount and/or Complexity of Data Reviewed External Data Reviewed: labs, radiology, ECG and notes. Labs: ordered. Decision-making details documented in ED Course. Radiology: ordered and independent interpretation performed. Decision-making details documented in ED Course. ECG/medicine tests: ordered and independent interpretation performed. Decision-making details documented in ED Course.  Risk Prescription drug management.   Patient presents with complaints of shortness of breath.  Symptoms worsening over period of several days.  Patient experiencing significant dyspnea on exertion.  He has not had any associated chest pain.  Patient's lung examination revealed significantly diminished breath sounds at arrival.  He has a significant smoking history.  He was given a DuoNeb and reports that he feels somewhat better.  There is no wheezing.  Chest x-ray without pneumonia.  Lab work returning now, patient profoundly anemic.  He was hospitalized this past month for GI bleed.  Upper endoscopy revealed gastric ulcer and duodenal changes.  Rectal examination revealed dark brown stool.  No melena.  Will initiate Protonix bolus and drip.  Patient will require transfusion and repeat hospitalization.  Will sign out to oncoming ER physician with remainder of labs pending.  CRITICAL CARE Performed by: Orpah Greek   Total critical care time: 30 minutes  Critical care time was  exclusive of separately billable procedures and treating other patients.  Critical care was necessary to treat or prevent imminent or life-threatening deterioration.  Critical care was time spent personally by me on the following activities: development of treatment plan with patient and/or surrogate as well as nursing, discussions with consultants, evaluation of patient's response to treatment, examination of patient, obtaining history from patient or surrogate, ordering and performing treatments and interventions, ordering and review of laboratory studies, ordering and review of radiographic studies, pulse oximetry and re-evaluation of patient's condition.         Final Clinical Impression(s) / ED Diagnoses Final diagnoses:  Symptomatic anemia    Rx / DC Orders ED Discharge Orders     None         Pieper Kasik, Gwenyth Allegra, MD 01/18/22 [phone]

## 2022-01-18 NOTE — ED Provider Notes (Signed)
Patient here with hemoglobin of 4.  To be admitted to medicine for concern for GI bleed.  Has been written for 3 units of packed red blood cells and started on IV Protonix.  History of ulcers.  Hemodynamically patient stable.  I have made [facility] team aware.  To be admitted to medicine for further care.  This chart was dictated using voice recognition software.  Despite best efforts to proofread,  errors can occur which can change the documentation meaning.    Lennice Sites, DO 01/18/22 [phone]

## 2022-01-18 NOTE — ED Notes (Signed)
At approx. 1515 I was notified by chloe-RN that while I was off the floor this pt called out complaining of SOB and was satting 91%.  Chloe placed him on 02 and got him comfortable around 4L.  At this time he was satting 100% on 4L.  I have titrated back to 2L and he is maintaining 100% at this time.

## 2022-01-18 NOTE — H&P (Signed)
History and Physical    Patient: Greg [name] ZOX:[id number] DOB: [birth year] DOA: 01/18/2022 DOS: the patient was seen and examined on 01/18/2022 PCP: Pcp, No  Patient coming from: Home - lives alone; NOK: Baldom[place] Lamy, [phone]   Chief Complaint: SOB  HPI: Greg [name] is a 71 y.o. male with medical history significant of AAA, HTN, ulcer and Barrett's esophagus presenting with SOB. He was last admitted from 11/7-13 for UGI bleed; he was transfused 3 units PRBC and EGD showed a nonbleeding ulcer.  He was discharged on BID PPI.  There was also concern for NSTEMI vs. Demand ischemia during the hospitalization and echo showed EF 35-40% with WMA, grade 2 diastolic dysfunction.  Cardiology did not feel that it was safe to pursue cath/PCI and then patient later declined cath.  He reports that he left a day early during his last hospitalization because he had some things he needed to do. He was due for another EGD in about 8 weeks. He has had mild fatigue and SOB but it wasn't that bad. Yesterday, it was so bad he couldn't get up to the bathroom. He had abdominal pain, chest pain, feet/leg pain. He smokes tobacco but doesn't drink. He "partially" smokes marijuana.     ER Course:  GI bleed, heme +.  Here for SOB.  Prior recent bleed with scope.  Hgb 4.4, transfusing.  GI will consult.  On Protonix drip.     Review of Systems: As mentioned in the history of present illness. All other systems reviewed and are negative. Past Medical History:  Diagnosis Date   Barrett's esophagus    Gastric ulcer    Past Surgical History:  Procedure Laterality Date   BIOPSY  12/20/2021   Procedure: BIOPSY;  Surgeon: Thornton Park, MD;  Location: [facility];  Service: Gastroenterology;;   ESOPHAGOGASTRODUODENOSCOPY (EGD) WITH PROPOFOL N/A 12/20/2021   Procedure: ESOPHAGOGASTRODUODENOSCOPY (EGD) WITH PROPOFOL;  Surgeon: Thornton Park, MD;  Location: [facility];  Service: Gastroenterology;   Laterality: N/A;   Social History:  reports that he has been smoking cigarettes. He has been smoking an average of 1 pack per day. He has never used smokeless tobacco. He reports that he does not currently use alcohol. He reports current drug use. Drug: Marijuana.  No Known Allergies  Family History  Problem Relation Age of Onset   Stomach cancer Neg Hx    Colon cancer Neg Hx    Esophageal cancer Neg Hx    Pancreatic cancer Neg Hx     Prior to Admission medications   Medication Sig Start Date End Date Taking? Authorizing Provider  acetaminophen (TYLENOL) 325 MG tablet Take 2 tablets (650 mg total) by mouth every 6 (six) hours as needed for mild pain (or Fever >/= 101). 12/24/21  Yes Eugenie Filler, MD  aspirin EC 81 MG tablet Take 1 tablet (81 mg total) by mouth daily. Swallow whole. 12/25/21  Yes Eugenie Filler, MD  atorvastatin (LIPITOR) 40 MG tablet Take 1 tablet (40 mg total) by mouth daily. 12/25/21  Yes Eugenie Filler, MD  lisinopril (ZESTRIL) 5 MG tablet Take 5 mg by mouth daily. 01/08/22  Yes [provider]  losartan (COZAAR) 50 MG tablet Take 1 tablet (50 mg total) by mouth daily. 12/25/21  Yes Eugenie Filler, MD  OVER THE COUNTER MEDICATION Over the counter pain relief   Yes [provider]  pantoprazole (PROTONIX) 40 MG tablet Take 1 tablet (40 mg total) by mouth 2 (two) times  daily. 12/24/21  Yes Eugenie Filler, MD    Physical Exam: Vitals:   01/18/22 1030 01/18/22 1200 01/18/22 1211 01/18/22 1230  BP: 1'10/61 99/72 99/72 '$ 106/65  Pulse: 93 84 98 100  Resp: '14 19 18 '$ (!) 25  Temp:  98.6 F (37 C) 98.6 F (37 C) 98.2 F (36.8 C)  TempSrc:  Oral  Oral  SpO2: 100% 100% 100% 99%  Weight:      Height:       General:  Appears calm and comfortable and is in NAD, getting blood transfusion now; thin, appears malnourished, disheveled Eyes:  EOMI, normal lids, iris ENT:  grossly normal hearing, lips & tongue, mmm; poor/absent Neck:  no  LAD, masses or thyromegaly Cardiovascular:  RRR, no m/r/g. No LE edema.  Respiratory:   CTA bilaterally with no wheezes/rales/rhonchi.  Normal respiratory effort. Abdomen:  soft, NT, ND Skin:  no rash or induration seen on limited exam; socks were adhered to his feet and appear to have not been removed since last hospitalization Musculoskeletal:  grossly normal tone BUE/BLE, good ROM, no bony abnormality Psychiatric:  blunted mood and affect, speech fluent and appropriate, AOx3, poor historian Neurologic:  CN 2-12 grossly intact, moves all extremities in coordinated fashion   Radiological Exams on Admission: Independently reviewed - see discussion in A/P where applicable  DG Chest Port 1 View  Result Date: 01/18/2022 CLINICAL DATA:  Shortness of breath. EXAM: PORTABLE CHEST 1 VIEW COMPARISON:  12/18/2021 FINDINGS: 0608 hours. Lungs are hyperexpanded. The lungs are clear without focal pneumonia, edema, pneumothorax or pleural effusion. Cardiopericardial silhouette is at upper limits of normal for size. The visualized bony structures of the thorax are unremarkable. Telemetry leads overlie the chest. IMPRESSION: Hyperexpansion without acute cardiopulmonary findings. Electronically Signed   By: Misty Stanley M.D.   On: 01/18/2022 06:22    EKG: Independently reviewed.  Sinus tachycardia with rate 105; nonspecific ST changes with no evidence of acute ischemia   Labs on Admission: I have personally reviewed the available labs and imaging studies at the time of the admission.  Pertinent labs:    Glucose 120 BNP 755.6 HS troponin 78, 79 WBC 4.8 Hgb 4.4; 10.4 on 11/13 Platelets 80 COVID/flu negative Heme positive   Assessment and Plan: Principal Problem:   Acute upper GI bleeding Active Problems:   AAA (abdominal aortic aneurysm) (HCC)   Lung nodule   Essential hypertension   ABLA (acute blood loss anemia)   Tobacco dependence   Dyslipidemia    Upper GI Bleed -Patient is  presenting with recurrent melena, suggestive of upper GI bleeding. -He was previously hospitalized for this recently and EGD on 11/9 showed concern for Barrett's as well as a nonbleeding ulcer -Patient has had SOB and was found to have Hgb 4.4, presumed to be secondary to upper GI bleeding.  -Will admit to telemetry -GI consulted by ED, will follow up recommendations -NPO after MN for EGD tomorrow -NS at 100 mL/hr initially ordered but patient was reporting SOB at the end of the 2nd unit (clear lungs) so will give 20 mg IV Lasix and hold IVF at this time -Start IV pantoprazole bolus and infusion given frank bleeding with concern for hemodynamic instability -Zofran IV for nausea -Avoid NSAIDs and SQ heparin -Maintain IV access (2 large bore IVs if possible). -Hold ASA for now  ABLA -Patient's SOB is most likely caused by anemia secondary to upper GI bleeding.  -His Hgb decreased from 10.4 on 11/13 to 4.4 today.  -  Type and screen were done in ED.  -Three units of blood were ordered by ED.  -Monitor closely and follow cbc q12h, transfuse as necessary for Hbg <7.  Prior concern for CAD, elevated troponin -During his last hospitalization, his troponin peaked at 589 -It is currently elevated but with a negative delta -This is likely associated with demand ischemia -He reports diffuse pain -For now, will continue it monitor on telemetry without current plan for cardiology consultation -He was recommended for outpatient cards f/u for ischemic testing during last hospitalization -Should new/worsening symptoms develop, inpatient cardiology consult should be considered  AAA/Common iliac artery aneurysm -Discussed with vascular surgery during last admission -He needs outpatient f/u  HTN -Marginal BP in the ER -Will hold BP medications -Of note, he appears to be taking both ACE (lisinopril) and ARB (losartan) - which is contraindicated; per last note, he was supposed to change from lisinopril  to losartan  Lung nodule, ongoing tobacco dependence -4 mm nodule -Needs outpatient f/u -Tobacco Dependence: encourage cessation; this was discussed with the patient and should be reviewed on an ongoing basis.   -Patch ordered  HLD -Continue Atorvastatin       Advance Care Planning:   Code Status: Full Code   Consults: GI  DVT Prophylaxis: SCDs  Family Communication: None present; he declined to have me call family at the time of admission  Severity of Illness: The appropriate patient status for this patient is INPATIENT. Inpatient status is judged to be reasonable and necessary in order to provide the required intensity of service to ensure the patient's safety. The patient's presenting symptoms, physical exam findings, and initial radiographic and laboratory data in the context of their chronic comorbidities is felt to place them at high risk for further clinical deterioration. Furthermore, it is not anticipated that the patient will be medically stable for discharge from the hospital within 2 midnights of admission.   * I certify that at the point of admission it is my clinical judgment that the patient will require inpatient hospital care spanning beyond 2 midnights from the point of admission due to high intensity of service, high risk for further deterioration and high frequency of surveillance required.*  Author: Karmen Bongo, MD 01/18/2022 3:10 PM  For on call review [email or web address].

## 2022-01-18 NOTE — Anesthesia Preprocedure Evaluation (Signed)
Anesthesia Evaluation  Patient identified by MRN, date of birth, ID band Patient awake    Reviewed: Allergy & Precautions, NPO status , Patient's Chart, lab work & pertinent test results  History of Anesthesia Complications Negative for: history of anesthetic complications  Airway Mallampati: I  TM Distance: >3 FB Neck ROM: Full    Dental  (+) Dental Advisory Given, Poor Dentition, Loose, Missing, Chipped   Pulmonary Current Smoker and Patient abstained from smoking. Neb this am   breath sounds clear to auscultation       Cardiovascular hypertension, Pt. on medications (-) angina + Past MI and + Peripheral Vascular Disease   Rhythm:Regular Rate:Normal  12/19/2021 ECHO: EF 35-40%, mod reduced LVF, severe hypokinesis of the mid to apical anteroseptal, inferoseptal, anterior walls, Grade 2 DD, normal RVF, no significant valvular abnormalities   Neuro/Psych negative neurological ROS     GI/Hepatic Neg liver ROS,GERD  Medicated and Controlled,,Bleeding gastric ulcer inguinal hernia extending into the scrotal sac, containing nonobstructed small and large bowe   Endo/Other  negative endocrine ROS    Renal/GU negative Renal ROS     Musculoskeletal   Abdominal   Peds  Hematology Hb 4.4, plt 80k: received 2u PRBCs: 7.2 this am with 60k    Anesthesia Other Findings   Reproductive/Obstetrics                              Anesthesia Physical Anesthesia Plan  ASA: 4  Anesthesia Plan: MAC   Post-op Pain Management: Minimal or no pain anticipated   Induction:   PONV Risk Score and Plan: 0 and Treatment may vary due to age or medical condition  Airway Management Planned: Natural Airway and Nasal Cannula  Additional Equipment: None  Intra-op Plan:   Post-operative Plan:   Informed Consent: I have reviewed the patients History and Physical, chart, labs and discussed the procedure including the  risks, benefits and alternatives for the proposed anesthesia with the patient or authorized representative who has indicated his/her understanding and acceptance.     Dental advisory given  Plan Discussed with: CRNA and Surgeon  Anesthesia Plan Comments:          Anesthesia Quick Evaluation

## 2022-01-18 NOTE — ED Notes (Signed)
Pt called out complaining of SOB.  I notified Dr. Lorin Mercy who evaluated the pt and thought lung sound were clear but decided we would give 20 mg of Lasix just in case.

## 2022-01-18 NOTE — ED Notes (Signed)
ED TO INPATIENT HANDOFF REPORT  ED Nurse Name and Phone #: Iona Coach Name/Age/Gender Avon Gully 71 y.o. male Room/Bed: 027C/027C  Code Status   Code Status: Full Code  Home/SNF/Other Home Patient oriented to: self, place, time, and situation Is this baseline? Yes   Triage Complete: Triage complete  Chief Complaint Acute upper GI bleeding [K92.2] Respiratory distress [R06.03]  Triage Note No notes on file   Allergies No Known Allergies  Level of Care/Admitting Diagnosis ED Disposition     ED Disposition  Admit   Condition  --   [facility]: [place] [[id number]]  Level of Care: ICU [6]  May admit patient to [facility] or [facility] if equivalent level of care is available:: No  Covid Evaluation: Confirmed COVID Negative  Diagnosis: Respiratory distress [938182]  Admitting Physician: Brand Males [phone]  Attending Physician: Brand Males [[id number]]  Certification:: I certify this patient will need inpatient services for at least 2 midnights          B Medical/Surgery History Past Medical History:  Diagnosis Date   Barrett's esophagus    Gastric ulcer    Past Surgical History:  Procedure Laterality Date   BIOPSY  12/20/2021   Procedure: BIOPSY;  Surgeon: Thornton Park, MD;  Location: [facility];  Service: Gastroenterology;;   ESOPHAGOGASTRODUODENOSCOPY (EGD) WITH PROPOFOL N/A 12/20/2021   Procedure: ESOPHAGOGASTRODUODENOSCOPY (EGD) WITH PROPOFOL;  Surgeon: Thornton Park, MD;  Location: [facility];  Service: Gastroenterology;  Laterality: N/A;     A IV Location/Drains/Wounds Patient Lines/Drains/Airways Status     Active Line/Drains/Airways     Name Placement date Placement time Site Days   Peripheral IV 01/18/22 20 G Left Antecubital 01/18/22  0605  Antecubital  less than 1   Peripheral IV 01/18/22 20 G Posterior;Right Forearm 01/18/22  0846  Forearm  less than 1   Urethral Catheter Katalina  Eubanks-CCCNP Double-lumen 16 Fr. 01/18/22  1650  Double-lumen  less than 1   Wound / Incision (Open or Dehisced) 08/08/20 Scrotum Right 08/08/20  0038  Scrotum  528            Intake/Output Last 24 hours  Intake/Output Summary (Last 24 hours) at 01/18/2022 1813 Last data filed at 01/18/2022 1543 Gross per 24 hour  Intake 730 ml  Output 450 ml  Net 280 ml    Labs/Imaging Results for orders placed or performed during the hospital encounter of 01/18/22 (from the past 48 hour(s))  Resp Panel by RT-PCR (Flu A&B, Covid) Anterior Nasal Swab     Status: None   Collection Time: 01/18/22  6:06 AM   Specimen: Anterior Nasal Swab  Result Value Ref Range   SARS Coronavirus 2 by RT PCR NEGATIVE NEGATIVE    Comment: (NOTE) SARS-CoV-2 target nucleic acids are NOT DETECTED.  The SARS-CoV-2 RNA is generally detectable in upper respiratory specimens during the acute phase of infection. The lowest concentration of SARS-CoV-2 viral copies this assay can detect is 138 copies/mL. A negative result does not preclude SARS-Cov-2 infection and should not be used as the sole basis for treatment or other patient management decisions. A negative result may occur with  improper specimen collection/handling, submission of specimen other than nasopharyngeal swab, presence of viral mutation(s) within the areas targeted by this assay, and inadequate number of viral copies(<138 copies/mL). A negative result must be combined with clinical observations, patient history, and epidemiological information. The expected result is Negative.  Fact Sheet for Patients:  EntrepreneurPulse.com.au  Fact Sheet for Healthcare Providers:  IncredibleEmployment.be  This test is no t yet approved or cleared by the [place] FDA and  has been authorized for detection and/or diagnosis of SARS-CoV-2 by FDA under an Emergency Use Authorization (EUA). This EUA will remain  in effect  (meaning this test can be used) for the duration of the COVID-19 declaration under Section 564(b)(1) of the Act, 21 U.S.C.section 360bbb-3(b)(1), unless the authorization is terminated  or revoked sooner.       Influenza A by PCR NEGATIVE NEGATIVE   Influenza B by PCR NEGATIVE NEGATIVE    Comment: (NOTE) The Xpert Xpress SARS-CoV-2/FLU/RSV plus assay is intended as an aid in the diagnosis of influenza from Nasopharyngeal swab specimens and should not be used as a sole basis for treatment. Nasal washings and aspirates are unacceptable for Xpert Xpress SARS-CoV-2/FLU/RSV testing.  Fact Sheet for Patients: EntrepreneurPulse.com.au  Fact Sheet for Healthcare Providers: IncredibleEmployment.be  This test is not yet approved or cleared by the [place] FDA and has been authorized for detection and/or diagnosis of SARS-CoV-2 by FDA under an Emergency Use Authorization (EUA). This EUA will remain in effect (meaning this test can be used) for the duration of the COVID-19 declaration under Section 564(b)(1) of the Act, 21 U.S.C. section 360bbb-3(b)(1), unless the authorization is terminated or revoked.  Performed at [facility], [place] [address], [place], [place] [address]   Basic metabolic panel     Status: Abnormal   Collection Time: 01/18/22  6:06 AM  Result Value Ref Range   Sodium 137 135 - 145 mmol/L   Potassium 3.9 3.5 - 5.1 mmol/L   Chloride 106 98 - 111 mmol/L   CO2 21 (L) 22 - 32 mmol/L   Glucose, Bld 120 (H) 70 - 99 mg/dL    Comment: Glucose reference range applies only to samples taken after fasting for at least 8 hours.   BUN 18 8 - 23 mg/dL   Creatinine, Ser 0.99 0.61 - 1.24 mg/dL   Calcium 8.7 (L) 8.9 - 10.3 mg/dL   GFR, Estimated >60 >60 mL/min    Comment: (NOTE) Calculated using the CKD-EPI Creatinine Equation (2021)    Anion gap 10 5 - 15    Comment: Performed at [facility] [address], [place],  [place] [address]  Troponin I (High Sensitivity)     Status: Abnormal   Collection Time: 01/18/22  6:06 AM  Result Value Ref Range   Troponin I (High Sensitivity) 78 (H) <18 ng/L    Comment: (NOTE) Elevated high sensitivity troponin I (hsTnI) values and significant  changes across serial measurements may suggest ACS but many other  chronic and acute conditions are known to elevate hsTnI results.  Refer to the "Links" section for chest pain algorithms and additional  guidance. Performed at [facility], [place] [address] [place], [place] [address]   Brain natriuretic peptide     Status: Abnormal   Collection Time: 01/18/22  6:06 AM  Result Value Ref Range   B Natriuretic Peptide 755.6 (H) 0.0 - 100.0 pg/mL    Comment: Performed at [facility] [address] [place], [place] [address]  CBC with Differential/Platelet     Status: Abnormal   Collection Time: 01/18/22  6:47 AM  Result Value Ref Range   WBC 4.8 4.0 - 10.5 K/uL   RBC 1.74 (L) 4.22 - 5.81 MIL/uL   Hemoglobin 4.4 (LL) 13.0 - 17.0 g/dL  Comment: REPEATED TO VERIFY CRITICAL RESULT CALLED TO, READ BACK BY AND VERIFIED WITH: M. Gilford Rile RN 01/18/2022 0713 JULIE MACEDA DEL ANGEL    HCT 14.6 (L) 39.0 - 52.0 %   MCV 83.9 80.0 - 100.0 fL   MCH 25.3 (L) 26.0 - 34.0 pg   MCHC 30.1 30.0 - 36.0 g/dL   RDW 18.5 (H) 11.5 - 15.5 %   Platelets 80 (L) 150 - 400 K/uL    Comment: Immature Platelet Fraction may be clinically indicated, consider ordering this additional test [id number] REPEATED TO VERIFY PLATELET COUNT CONFIRMED BY SMEAR    nRBC 0.0 0.0 - 0.2 %   Neutrophils Relative % 74 %   Neutro Abs 3.5 1.7 - 7.7 K/uL   Lymphocytes Relative 16 %   Lymphs Abs 0.8 0.7 - 4.0 K/uL   Monocytes Relative 9 %   Monocytes Absolute 0.4 0.1 - 1.0 K/uL   Eosinophils Relative 1 %   Eosinophils Absolute 0.0 0.0 - 0.5 K/uL   Basophils Relative 0 %   Basophils Absolute 0.0 0.0 - 0.1 K/uL   Immature Granulocytes 0 %   Abs Immature  Granulocytes 0.02 0.00 - 0.07 K/uL    Comment: Performed at [facility], [address], [place], [place] [address]  POC occult blood, ED     Status: Abnormal   Collection Time: 01/18/22  7:01 AM  Result Value Ref Range   Fecal Occult Bld POSITIVE (A) NEGATIVE  Prepare RBC (crossmatch)     Status: None   Collection Time: 01/18/22  8:17 AM  Result Value Ref Range   Order Confirmation      ORDER PROCESSED BY BLOOD BANK Performed at [facility], [place] [address] [place], [place] [address]   Type and screen     Status: None (Preliminary result)   Collection Time: 01/18/22  8:17 AM  Result Value Ref Range   ABO/RH(D) B POS    Antibody Screen NEG    Sample Expiration 01/21/2022,2359    Unit Number [id number]    Blood Component Type RED CELLS,LR    Unit division 00    Status of Unit ISSUED    Transfusion Status OK TO TRANSFUSE    Crossmatch Result Compatible    Unit Number [id number]    Blood Component Type RED CELLS,LR    Unit division 00    Status of Unit ISSUED    Transfusion Status OK TO TRANSFUSE    Crossmatch Result      Compatible Performed at [facility], [place] [address], [place], [place] [address]    Unit Number [id number]    Blood Component Type RED CELLS,LR    Unit division 00    Status of Unit ALLOCATED    Transfusion Status OK TO TRANSFUSE    Crossmatch Result Compatible   Troponin I (High Sensitivity)     Status: Abnormal   Collection Time: 01/18/22  8:17 AM  Result Value Ref Range   Troponin I (High Sensitivity) 79 (H) <18 ng/L    Comment: (NOTE) Elevated high sensitivity troponin I (hsTnI) values and significant  changes across serial measurements may suggest ACS but many other  chronic and acute conditions are known to elevate hsTnI results.  Refer to the "Links" section for chest pain algorithms and additional  guidance. Performed at [facility], [place] [address], [place], [place] [address]    DG Chest Portable 1  View  Result Date: 01/18/2022 CLINICAL DATA:  Shortness of breath. EXAM: PORTABLE CHEST 1 VIEW COMPARISON:  January 18, 2022 FINDINGS: EKG leads project over the chest. Cardiomediastinal contours and hilar structures are stable. Diffuse interstitial prominence has developed since the study of the same date at 6:08 a.m. No signs of pleural effusion or pneumothorax. No lobar level consolidative process. Areas most pronounced at the medial lung bases with respect to interstitial and alveolar changes. On limited assessment no acute skeletal findings. IMPRESSION: Diffuse interstitial prominence has developed since the study of the same date at 6:08 AM. Would consider the possibility of pulmonary edema, aspiration changes or atypical infection. Electronically Signed   By: Zetta Bills M.D.   On: 01/18/2022 16:12   DG Chest Port 1 View  Result Date: 01/18/2022 CLINICAL DATA:  Shortness of breath. EXAM: PORTABLE CHEST 1 VIEW COMPARISON:  12/18/2021 FINDINGS: 0608 hours. Lungs are hyperexpanded. The lungs are clear without focal pneumonia, edema, pneumothorax or pleural effusion. Cardiopericardial silhouette is at upper limits of normal for size. The visualized bony structures of the thorax are unremarkable. Telemetry leads overlie the chest. IMPRESSION: Hyperexpansion without acute cardiopulmonary findings. Electronically Signed   By: Misty Stanley M.D.   On: 01/18/2022 06:22    Pending Labs Unresulted Labs (From admission, onward)     Start     Ordered   01/19/22 0623  Basic metabolic panel  Tomorrow morning,   R        01/18/22 1511   01/19/22 0500  Magnesium  Daily,   R      01/18/22 1658   01/19/22 0500  Phosphorus  Daily,   R      01/18/22 1658   01/19/22 0500  Procalcitonin  Daily,   R      01/18/22 1718   01/18/22 1719  Procalcitonin - Baseline  ONCE - URGENT,   URGENT        01/18/22 1718   01/18/22 1718  Respiratory (~20 pathogens) panel by PCR  (Respiratory panel by PCR (~20 pathogens, ~24  hr TAT)  w precautions)  Once,   R        01/18/22 1717   01/18/22 1718  Strep pneumoniae urinary antigen  Once,   R        01/18/22 1717   01/18/22 1718  Legionella Pneumophila Serogp 1 Ur Ag  Once,   R        01/18/22 1717   01/18/22 1704  Hepatic function panel  Once,   R        01/18/22 1703   01/18/22 1704  Protime-INR  Once,   R        01/18/22 1703   01/18/22 1703  Phosphorus  Once,   R        01/18/22 1702   01/18/22 1702  Magnesium  Once,   R        01/18/22 1702   01/18/22 1659  CBC  Every 6 hours (unscheduled),   R (with TIMED occurrences)      01/18/22 1658   01/18/22 1658  Brain natriuretic peptide  Once,   R        01/18/22 1657   01/18/22 1658  Lactic acid, plasma  ONCE - STAT,   STAT        01/18/22 1657   01/18/22 0554  CBC with Differential/Platelet  ONCE - STAT,   STAT        01/18/22 0553  Vitals/Pain Today's Vitals   01/18/22 1515 01/18/22 1530 01/18/22 1537 01/18/22 1620  BP:  127/86  (!) 152/110  Pulse: 97 98 100 (!) 126  Resp: 20 (!) 23 19 (!) 30  Temp:  98 F (36.7 C)    TempSrc:  Oral    SpO2: 99% 100% 100% 100%  Weight:      Height:      PainSc:        Isolation Precautions Droplet precaution  Medications Medications  pantoprozole (PROTONIX) 80 mg /NS 100 mL infusion (8 mg/hr Intravenous New Bag/Given 01/18/22 0844)  pantoprazole (PROTONIX) injection 40 mg (has no administration in time range)  0.9 %  sodium chloride infusion (has no administration in time range)  atorvastatin (LIPITOR) tablet 40 mg (has no administration in time range)  acetaminophen (TYLENOL) tablet 650 mg (has no administration in time range)    Or  acetaminophen (TYLENOL) suppository 650 mg (has no administration in time range)  ondansetron (ZOFRAN) tablet 4 mg (has no administration in time range)    Or  ondansetron (ZOFRAN) injection 4 mg (has no administration in time range)  hydrALAZINE (APRESOLINE) injection 5 mg (has no administration in time  range)  sodium chloride flush (NS) 0.9 % injection 3 mL (has no administration in time range)  nicotine (NICODERM CQ - dosed in mg/24 hours) patch 14 mg (has no administration in time range)  albuterol (PROVENTIL) (2.5 MG/3ML) 0.083% nebulizer solution 2.5 mg (has no administration in time range)  budesonide (PULMICORT) nebulizer solution 0.5 mg (0.5 mg Nebulization Given 01/18/22 1725)  ipratropium-albuterol (DUONEB) 0.5-2.5 (3) MG/3ML nebulizer solution 3 mL (has no administration in time range)  methylPREDNISolone sodium succinate (SOLU-MEDROL) 40 mg/mL injection 40 mg (has no administration in time range)  ipratropium-albuterol (DUONEB) 0.5-2.5 (3) MG/3ML nebulizer solution 3 mL (3 mLs Nebulization Given 01/18/22 0619)  albuterol (PROVENTIL) (2.5 MG/3ML) 0.083% nebulizer solution 2.5 mg (2.5 mg Nebulization Given 01/18/22 0619)  pantoprazole (PROTONIX) 80 mg /NS 100 mL IVPB (0 mg Intravenous Stopped 01/18/22 0841)  furosemide (LASIX) injection 20 mg (20 mg Intravenous Given 01/18/22 1531)  furosemide (LASIX) injection 20 mg (20 mg Intravenous Given 01/18/22 1722)  methylPREDNISolone sodium succinate (SOLU-MEDROL) 125 mg/2 mL injection 125 mg (125 mg Intravenous Given 01/18/22 1721)  diphenhydrAMINE (BENADRYL) injection 25 mg (25 mg Intravenous Given 01/18/22 1722)    Mobility walks with device Low fall risk   Focused Assessments See Respiratory assessments     R Recommendations: See Admitting Provider Note  Report given to:   Additional Notes:

## 2022-01-18 NOTE — Consult Note (Addendum)
NAME:  Greg [name], MRN:  [id number], DOB:  10-07-1950, LOS: 0 ADMISSION DATE:  01/18/2022, CONSULTATION DATE:  01/18/2022 REFERRING MD:  Dr. Lorin Mercy, CHIEF COMPLAINT: Shortness of breath    History of Present Illness:  71 year old male with recent admission 11/7-11/13 with upper GI bleed. Underwent EGD which showed a non-bleeding ulcer. Discharged on PPI. Stay complicated with concern for NSTEMI, ECHO with EF 35-40% and grade 2 DD.   Presents to ED 12/8 with reported shortness of breath. States that fatigue and shortness of breath progressed and became worse yesterday where he couldn't even get up to go to bathroom. On arrival to ED patient with tachypnea, tachycardia, and accessory muscle use with diminished breath sounds. CXR with no acute. Found to have hemoglobin of 4.4 (10.4 on discharge 3 weeks prior). Hemoccult positive. Reports dark stools for a couple weeks, denies abdominal pain. GI consulted with plans for EGD.   12/8 afternoon patient completed 2nd unit of RBC when he was complaining of increased shortness of breath. Given 20 mg lasix. Increased supplemental oxygen to 4L. Progressive distress with accessory muscle use. Given nebs and placed on BiPAP.  Critical care consulted.   Pertinent  Medical History  AAA, HTN, Gastric Ulcer, Barrett's esophagus   Significant Hospital Events: Including procedures, antibiotic start and stop dates in addition to other pertinent events   12/8 > Presents to ED   Interim History / Subjective:  As above.   Objective   Blood pressure (!) 152/110, pulse (!) 126, temperature 98 F (36.7 C), temperature source Oral, resp. rate (!) 30, height '5\' 8"'$  (1.727 m), weight 61.2 kg, SpO2 100 %.    FiO2 (%):  [40 %] 40 %   Intake/Output Summary (Last 24 hours) at 01/18/2022 1705 Last data filed at 01/18/2022 1543 Gross per 24 hour  Intake 730 ml  Output 450 ml  Net 280 ml   Filed Weights   01/18/22 0554  Weight: 61.2 kg    Examination: General:  Critically ill appearing adult male, lying in bed, mild accessory muscle use  HENT: BiPAP in place  Lungs: Exp wheeze, diminished to bases Cardiovascular: Tachy, no mRG Abdomen: soft, non-tender, active bowel sounds  Extremities: +2 BLE edema Neuro: alert, follows commands, anxious  GU: with enlarged scrotum   Resolved Hospital Problem list     Assessment & Plan:   Acute Hypoxic Respiratory Distress in setting of AECOPD +/- pulmonary edema - CT chest 11/7 with advanced emphysema and 4 mm nodule in right middle lobe   - Low concern for TRALI given patient presented with similar appearance, however always a possibility  - COVID/Flu negative  Ongoing Tobacco Abuse Plan - Continue BiPAP  - Titrate Supplemental oxygen for saturation >92  - Give additional 20 mg lasix now  - Give 125 mg solu-medrol now, followed by  - Start scheduled nebs  - Send RVP, strep P and Legionalle   Decompensated Heart Failure - ECHO 11/8 EF 35-40% and grade 2 DD H/O HTN, HLD  Plan - Give additional 20 mg lasix now  - Diuresis PRN  - Obtain repeat Troponin  - EKG with no acute ST changes  - Hold home lisinopril and losartan at this time  - Continue statin   Blood loss anemia, suspect patient with slow ongoing bleed - H/O Gastric Ulcer  Plan - GI consulted  - If stable with plans for EGD in AM. NPO at [facility]  - Continue PPI  - Send INR  Enlarged right inguinal hernia extending into scrotal sac  Plan - Patient refused surgery on last admission  - Should re-address when stable   Best Practice (right click and "Reselect all SmartList Selections" daily)   Diet/type: clear liquids DVT prophylaxis: SCD GI prophylaxis: PPI Lines: N/A Foley:  Yes, and it is still needed Code Status:  full code Last date of multidisciplinary goals of care discussion [discussed code status with patient. Wishes to remain Full Code at this time.]  Will admit to ICU given ongoing respiratory compromise   Labs    CBC: Recent Labs  Lab 01/18/22 0647  WBC 4.8  NEUTROABS 3.5  HGB 4.4*  HCT 14.6*  MCV 83.9  PLT 80*    Basic Metabolic Panel: Recent Labs  Lab 01/18/22 0606  NA 137  K 3.9  CL 106  CO2 21*  GLUCOSE 120*  BUN 18  CREATININE 0.99  CALCIUM 8.7*   GFR: Estimated Creatinine Clearance: 59.2 mL/min (by C-G formula based on SCr of 0.99 mg/dL). Recent Labs  Lab 01/18/22 0647  WBC 4.8    Liver Function Tests: No results for input(s): "AST", "ALT", "ALKPHOS", "BILITOT", "PROT", "ALBUMIN" in the last 168 hours. No results for input(s): "LIPASE", "AMYLASE" in the last 168 hours. No results for input(s): "AMMONIA" in the last 168 hours.  ABG No results found for: "PHART", "PCO2ART", "PO2ART", "HCO3", "TCO2", "ACIDBASEDEF", "O2SAT"   Coagulation Profile: No results for input(s): "INR", "PROTIME" in the last 168 hours.  Cardiac Enzymes: No results for input(s): "CKTOTAL", "CKMB", "CKMBINDEX", "TROPONINI" in the last 168 hours.  HbA1C: No results found for: "HGBA1C"  CBG: No results for input(s): "GLUCAP" in the last 168 hours.  Review of Systems:   +SOB, denies cough, fever   Past Medical History:  He,  has a past medical history of Barrett's esophagus and Gastric ulcer.   Surgical History:   Past Surgical History:  Procedure Laterality Date   BIOPSY  12/20/2021   Procedure: BIOPSY;  Surgeon: Thornton Park, MD;  Location: [facility];  Service: Gastroenterology;;   ESOPHAGOGASTRODUODENOSCOPY (EGD) WITH PROPOFOL N/A 12/20/2021   Procedure: ESOPHAGOGASTRODUODENOSCOPY (EGD) WITH PROPOFOL;  Surgeon: Thornton Park, MD;  Location: [facility];  Service: Gastroenterology;  Laterality: N/A;     Social History:   reports that he has been smoking cigarettes. He has been smoking an average of 1 pack per day. He has never used smokeless tobacco. He reports that he does not currently use alcohol. He reports current drug use. Drug: Marijuana.   Family History:   His family history is negative for Stomach cancer, Colon cancer, Esophageal cancer, and Pancreatic cancer.   Allergies No Known Allergies   Home Medications  Prior to Admission medications   Medication Sig Start Date End Date Taking? Authorizing Provider  acetaminophen (TYLENOL) 325 MG tablet Take 2 tablets (650 mg total) by mouth every 6 (six) hours as needed for mild pain (or Fever >/= 101). 12/24/21  Yes Eugenie Filler, MD  aspirin EC 81 MG tablet Take 1 tablet (81 mg total) by mouth daily. Swallow whole. 12/25/21  Yes Eugenie Filler, MD  atorvastatin (LIPITOR) 40 MG tablet Take 1 tablet (40 mg total) by mouth daily. 12/25/21  Yes Eugenie Filler, MD  lisinopril (ZESTRIL) 5 MG tablet Take 5 mg by mouth daily. 01/08/22  Yes [provider]  losartan (COZAAR) 50 MG tablet Take 1 tablet (50 mg total) by mouth daily. 12/25/21  Yes Eugenie Filler, MD  OVER THE  COUNTER MEDICATION Over the counter pain relief   Yes [provider]  pantoprazole (PROTONIX) 40 MG tablet Take 1 tablet (40 mg total) by mouth 2 (two) times daily. 12/24/21  Yes Eugenie Filler, MD     Critical care time: 55 minutes     CRITICAL CARE Performed by: Omar Person   Total critical care time: 55 minutes  Critical care time was exclusive of separately billable procedures and treating other patients.  Critical care was necessary to treat or prevent imminent or life-threatening deterioration.  Critical care was time spent personally by me on the following activities: development of treatment plan with patient and/or surrogate as well as nursing, discussions with consultants, evaluation of patient's response to treatment, examination of patient, obtaining history from patient or surrogate, ordering and performing treatments and interventions, ordering and review of laboratory studies, ordering and review of radiographic studies, pulse oximetry and re-evaluation of patient's  condition.  Hayden Pedro, AGACNP-BC [facility]  PCCM Pgr: [phone]

## 2022-01-19 ENCOUNTER — Inpatient Hospital Stay (HOSPITAL_COMMUNITY): Payer: Medicare Other | Admitting: Anesthesiology

## 2022-01-19 ENCOUNTER — Encounter (HOSPITAL_COMMUNITY): Payer: Self-pay | Admitting: Internal Medicine

## 2022-01-19 ENCOUNTER — Encounter (HOSPITAL_COMMUNITY)
Admission: EM | Disposition: A | Discharge status: Home / Self Care | Payer: Self-pay | Source: Home / Self Care | Attending: Internal Medicine

## 2022-01-19 DIAGNOSIS — K31811 Angiodysplasia of stomach and duodenum with bleeding: Secondary | ICD-10-CM

## 2022-01-19 DIAGNOSIS — J9601 Acute respiratory failure with hypoxia: Secondary | ICD-10-CM | POA: Diagnosis not present

## 2022-01-19 DIAGNOSIS — I10 Essential (primary) hypertension: Secondary | ICD-10-CM

## 2022-01-19 DIAGNOSIS — D509 Iron deficiency anemia, unspecified: Secondary | ICD-10-CM

## 2022-01-19 DIAGNOSIS — I252 Old myocardial infarction: Secondary | ICD-10-CM | POA: Diagnosis not present

## 2022-01-19 DIAGNOSIS — F17210 Nicotine dependence, cigarettes, uncomplicated: Secondary | ICD-10-CM

## 2022-01-19 HISTORY — PX: ESOPHAGOGASTRODUODENOSCOPY (EGD) WITH PROPOFOL: SHX5813

## 2022-01-19 HISTORY — PX: HOT HEMOSTASIS: SHX5433

## 2022-01-19 LAB — CBC WITH DIFFERENTIAL/PLATELET
Abs Immature Granulocytes: 0.04 10*3/uL (ref 0.00–0.07)
Basophils Absolute: 0 10*3/uL (ref 0.0–0.1)
Basophils Relative: 0 %
Eosinophils Absolute: 0 10*3/uL (ref 0.0–0.5)
Eosinophils Relative: 0 %
HCT: 22.4 % — ABNORMAL LOW (ref 39.0–52.0)
Hemoglobin: 6.9 g/dL — CL (ref 13.0–17.0)
Immature Granulocytes: 1 %
Lymphocytes Relative: 13 %
Lymphs Abs: 0.8 10*3/uL (ref 0.7–4.0)
MCH: 24.8 pg — ABNORMAL LOW (ref 26.0–34.0)
MCHC: 30.8 g/dL (ref 30.0–36.0)
MCV: 80.6 fL (ref 80.0–100.0)
Monocytes Absolute: 1.1 10*3/uL — ABNORMAL HIGH (ref 0.1–1.0)
Monocytes Relative: 16 %
Neutro Abs: 4.7 10*3/uL (ref 1.7–7.7)
Neutrophils Relative %: 70 %
Platelets: 68 10*3/uL — ABNORMAL LOW (ref 150–400)
RBC: 2.78 MIL/uL — ABNORMAL LOW (ref 4.22–5.81)
RDW: 18.8 % — ABNORMAL HIGH (ref 11.5–15.5)
WBC: 6.6 10*3/uL (ref 4.0–10.5)
nRBC: 0.6 % — ABNORMAL HIGH (ref 0.0–0.2)

## 2022-01-19 LAB — CBC
HCT: 23 % — ABNORMAL LOW (ref 39.0–52.0)
Hemoglobin: 7.2 g/dL — ABNORMAL LOW (ref 13.0–17.0)
MCH: 24.7 pg — ABNORMAL LOW (ref 26.0–34.0)
MCHC: 31.3 g/dL (ref 30.0–36.0)
MCV: 79 fL — ABNORMAL LOW (ref 80.0–100.0)
Platelets: 60 10*3/uL — ABNORMAL LOW (ref 150–400)
RBC: 2.91 MIL/uL — ABNORMAL LOW (ref 4.22–5.81)
RDW: 18.9 % — ABNORMAL HIGH (ref 11.5–15.5)
WBC: 4.4 10*3/uL (ref 4.0–10.5)
nRBC: 1.1 % — ABNORMAL HIGH (ref 0.0–0.2)

## 2022-01-19 LAB — RESPIRATORY PANEL BY PCR

## 2022-01-19 LAB — LACTIC ACID, PLASMA
Lactic Acid, Venous: 4 mmol/L (ref 0.5–1.9)
Lactic Acid, Venous: 1.1 mmol/L (ref 0.5–1.9)

## 2022-01-19 LAB — BASIC METABOLIC PANEL WITH GFR
Anion gap: 11 (ref 5–15)
BUN: 13 mg/dL (ref 8–23)
CO2: 25 mmol/L (ref 22–32)
Calcium: 8.7 mg/dL — ABNORMAL LOW (ref 8.9–10.3)
Chloride: 103 mmol/L (ref 98–111)
Creatinine, Ser: 1.06 mg/dL (ref 0.61–1.24)
GFR, Estimated: >60 mL/min (ref >60)
Glucose, Bld: 127 mg/dL — ABNORMAL HIGH (ref 70–99)
Potassium: 3.7 mmol/L (ref 3.5–5.1)
Sodium: 139 mmol/L (ref 135–145)

## 2022-01-19 LAB — PROCALCITONIN
Procalcitonin: 0.21 ng/mL
Procalcitonin: 0.41 ng/mL

## 2022-01-19 LAB — MAGNESIUM: Magnesium: 2 mg/dL (ref 1.7–2.4)

## 2022-01-19 LAB — PREPARE RBC (CROSSMATCH)

## 2022-01-19 LAB — BRAIN NATRIURETIC PEPTIDE: B Natriuretic Peptide: 1129.8 pg/mL — ABNORMAL HIGH (ref 0.0–100.0)

## 2022-01-19 LAB — TROPONIN I (HIGH SENSITIVITY)
Troponin I (High Sensitivity): 345 ng/L (ref <18)
Troponin I (High Sensitivity): 296 ng/L (ref <18)

## 2022-01-19 LAB — PHOSPHORUS: Phosphorus: 4.2 mg/dL (ref 2.5–4.6)

## 2022-01-19 SURGERY — ESOPHAGOGASTRODUODENOSCOPY (EGD) WITH PROPOFOL
Anesthesia: Monitor Anesthesia Care

## 2022-01-19 MED ORDER — FUROSEMIDE 10 MG/ML IJ SOLN: 40.0000 mg | Freq: Once | INTRAMUSCULAR | Status: DC

## 2022-01-19 MED ORDER — LIDOCAINE 2% (20 MG/ML) 5 ML SYRINGE
INTRAMUSCULAR | Status: DC | PRN
  Administered 2022-01-19: 40 mg via INTRAVENOUS

## 2022-01-19 MED ORDER — SODIUM CHLORIDE 0.9 % IV SOLN: INTRAVENOUS | Status: DC

## 2022-01-19 MED ORDER — SODIUM CHLORIDE 0.9% IV SOLUTION: Freq: Once | INTRAVENOUS | Status: DC

## 2022-01-19 MED ORDER — PROPOFOL 500 MG/50ML IV EMUL
INTRAVENOUS | Status: DC | PRN
  Administered 2022-01-19: 100 ug/kg/min via INTRAVENOUS
  Administered 2022-01-19: 20 mg via INTRAVENOUS

## 2022-01-19 MED ORDER — PHENYLEPHRINE HCL-NACL 20-0.9 MG/250ML-% IV SOLN
INTRAVENOUS | Status: DC | PRN
  Administered 2022-01-19: 25 ug/min via INTRAVENOUS

## 2022-01-19 MED ORDER — LACTATED RINGERS IV SOLN
INTRAVENOUS | Status: AC | PRN
  Administered 2022-01-19: 1000 mL via INTRAVENOUS

## 2022-01-19 MED ORDER — CYANOCOBALAMIN 1000 MCG/ML IJ SOLN
1000.0000 ug | Freq: Once | INTRAMUSCULAR | Status: AC
  Administered 2022-01-19: 1000 ug via INTRAMUSCULAR
  Filled 2022-01-19: qty 1

## 2022-01-19 MED ORDER — ONDANSETRON HCL 4 MG/2ML IJ SOLN
INTRAMUSCULAR | Status: DC | PRN
  Administered 2022-01-19: 4 mg via INTRAVENOUS

## 2022-01-19 MED ORDER — SODIUM CHLORIDE 0.9 % IV SOLN
250.0000 mg | Freq: Every day | INTRAVENOUS | Status: AC
  Administered 2022-01-19 – 2022-01-22 (×4): 250 mg via INTRAVENOUS
  Filled 2022-01-19 (×4): qty 20

## 2022-01-19 SURGICAL SUPPLY — 15 items

## 2022-01-19 NOTE — Interval H&P Note (Signed)
History and Physical Interval Note:  01/19/2022 8:24 AM  Greg [name]  has presented today for surgery, with the diagnosis of Anemia; gastric ulcer.  The various methods of treatment have been discussed with the patient and family. After consideration of risks, benefits and other options for treatment, the patient has consented to  Procedure(s): ESOPHAGOGASTRODUODENOSCOPY (EGD) WITH PROPOFOL (N/A) as a surgical intervention.  The patient's history has been reviewed, patient examined, no change in status, stable for surgery.  I have reviewed the patient's chart and labs.  Questions were answered to the patient's satisfaction.     [name]

## 2022-01-19 NOTE — Op Note (Signed)
[facility] Patient Name: Greg [name] Procedure Date : 01/19/2022 MRN: [id number] Attending MD: Mauri Pole , MD, [id number] Date of Birth: [birth year] CSN: [id number] Age: 71 Admit Type: Inpatient Procedure:                Upper GI endoscopy Indications:              Suspected upper gastrointestinal bleeding,                            Suspected upper gastrointestinal bleeding in                            patient with unexplained iron deficiency anemia Providers:                Mauri Pole, MD, Brien Mates,                            Technician, William Dalton, Technician, Jamison Neighbor RN, RN, Benay Pillow, RN Referring MD:              Medicines:                Monitored Anesthesia Care Complications:            No immediate complications. Estimated Blood Loss:     Estimated blood loss was minimal. Procedure:                After obtaining informed consent, the endoscope was                            passed under direct vision. Throughout the                            procedure, the patient's blood pressure, pulse, and                            oxygen saturations were monitored continuously. The                            GIF-H190 ([id number] Olympus endoscope was introduced                            through the mouth, and advanced to the second part                            of duodenum. The upper GI endoscopy was                            accomplished without difficulty. The patient                            tolerated the procedure well. Scope In: Scope Out: Findings:      The Z-line was regular and was found 36 cm from the incisors.      No  gross lesions were noted in the entire esophagus.      A single less than 1 mm angioectasia with bleeding was found in the       prepyloric region of the stomach. Coagulation for hemostasis using argon       plasma was successful.      The exam of the stomach was  otherwise normal.      The cardia and gastric fundus were normal on retroflexion.      A single less than 1 mm angioectasia with typical arborization was found       in the duodenal bulb. Coagulation for hemostasis using argon plasma was       successful.      A single 2 mm angioectasia with typical arborization was found in the       second portion of the duodenum. Coagulation for hemostasis using argon       plasma was successful. Impression:               - Z-line regular, 36 cm from the incisors.                           - No gross lesions in the entire esophagus.                           - A single bleeding angioectasia in the stomach.                            Treated with argon plasma coagulation (APC).                           - A single angioectasia in the duodenum. Treated                            with argon plasma coagulation (APC).                           - A single angioectasia in the duodenum. Treated                            with argon plasma coagulation (APC).                           - No specimens collected. Recommendation:           - Mechanical soft diet.                           - Continue present medications.                           - Pantoprazole '40mg'$  BID                           - IV Ferraheme X 2 for severe iron deficiency anemia                           - Vit B 12 injection 1000 mcg                           -  Please consult hematology for pancytopenia,                            severe thrombocytopenia (No evidenc of cirrhosis or                            portal HTN based on imaging or EGD) Procedure Code(s):        --- Professional ---                           [id number], Esophagogastroduodenoscopy, flexible,                            transoral; with control of bleeding, any method Diagnosis Code(s):        --- Professional ---                           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 All rights reserved. The codes documented in this report are preliminary and upon coder review may  be revised to meet current compliance requirements. Mauri Pole, MD 01/19/2022 9:23:08 AM This report has been signed electronically. Number of Addenda: 0

## 2022-01-19 NOTE — Progress Notes (Signed)
eLink Physician-Brief Progress Note Patient Name: Vyron Fronczak DOB: Nov 13, 1950 MRN: [id number]   Date of Service  01/19/2022  HPI/Events of Note  Patient diuresed 3600 mL post Lasix 20 mg IV X 1. BP = 118/85. Sat - 98% and RR = 22. Nursing concerned about BP drop with another 20 mg Lasix dose ordered for now.  eICU Interventions  Plan: Hold scheduled Lasix 20 mg IV dose.     Intervention Category Major Interventions: Other:  Lysle Dingwall 01/19/2022, 3:40 AM

## 2022-01-19 NOTE — Progress Notes (Signed)
NAME:  Greg [name], MRN:  [id number], DOB:  03-15-1950, LOS: 1 ADMISSION DATE:  01/18/2022, CONSULTATION DATE:  01/18/2022 REFERRING MD:  Dr. Ophelia Charter, CHIEF COMPLAINT: Shortness of breath    History of Present Illness:  71 year old male with recent admission 11/7-11/13 with upper GI bleed. Underwent EGD which showed a non-bleeding ulcer. Discharged on PPI. Stay complicated with concern for NSTEMI, ECHO with EF 35-40% and grade 2 DD.   Presents to ED 12/8 with reported shortness of breath. States that fatigue and shortness of breath progressed and became worse yesterday where he couldn't even get up to go to bathroom. On arrival to ED patient with tachypnea, tachycardia, and accessory muscle use with diminished breath sounds. CXR with no acute. Found to have hemoglobin of 4.4 (10.4 on discharge 3 weeks prior). Hemoccult positive. Reports dark stools for a couple weeks, denies abdominal pain. GI consulted with plans for EGD.   12/8 afternoon patient completed 2nd unit of RBC when he was complaining of increased shortness of breath. Given 20 mg lasix. Increased supplemental oxygen to 4L. Progressive distress with accessory muscle use. Given nebs and placed on BiPAP.  Critical care consulted.   Pertinent  Medical History  AAA, HTN, Gastric Ulcer, Barrett's esophagus   Significant Hospital Events: Including procedures, antibiotic start and stop dates in addition to other pertinent events   12/8 > Presents to ED  12/9 EGD shows 2 areas of bleeding AVM which were cauterized by laser.  Interim History / Subjective:  Denies abdominal pain.  Feels better.  No nausea.  No shortness of breath.  Objective   Blood pressure (!) 95/58, pulse (!) 113, temperature 98.2 F (36.8 C), temperature source Oral, resp. rate 18, height 5\' 8"  (1.727 m), weight 61.2 kg, SpO2 97 %.    FiO2 (%):  [28 %-40 %] 28 %   Intake/Output Summary (Last 24 hours) at 01/19/2022 1720 Last data filed at 01/19/2022 0845 Gross per  24 hour  Intake 742.28 ml  Output 4200 ml  Net -3457.72 ml    Filed Weights   01/18/22 0554  Weight: 61.2 kg    Examination: General: Lying in bed in no distress.   HENT: Oropharynx intact.  No pressure injuries Lungs: Crackles at both bases Cardiovascular: Normal heart sounds Abdomen: soft, non-tender, active bowel sounds  Extremities: +2 BLE edema Neuro: alert, follows commands, anxious  GU: with enlarged scrotum copious urine production  Ancillary tests personally reviewed  Normal electrolytes, normal renal function Elevated BNP at 1129 Troponin peak 345 Lactic acid has cleared 1.1 Hemoglobin 6.9 Platelet count 68  Assessment & Plan:   Acute Hypoxic Respiratory Distress in setting of AECOPD +/- pulmonary edema - CT chest 11/7 with advanced emphysema and 4 mm nodule in right middle lobe   -Has improved with diuresis. -Further diuretic as patient diuresing spontaneously  Ongoing Tobacco Abuse Plan -Transition to maintenance COPD therapy  Decompensated Heart Failure - ECHO 11/8 EF 35-40% and grade 2 DD H/O HTN, HLD  -Continues to diurese  Blood loss anemia, suspect patient with slow ongoing bleed,  Enlarged right inguinal hernia extending into scrotal sac  Plan - Patient refused surgery on last admission  - Should re-address when stable   Best Practice (right click and "Reselect all SmartList Selections" daily)   Diet/type: clear liquids DVT prophylaxis: SCD GI prophylaxis: PPI Lines: N/A Foley:  Yes, and it is still needed Code Status:  full code Last date of multidisciplinary goals of care discussion [discussed  code status with patient. Wishes to remain Full Code at this time.]  Lynnell Catalan, MD [facility] ICU Physician [facility]  Pager: [phone] Or Epic Secure Chat After hours: [phone].  01/20/2022, 11:39 AM

## 2022-01-19 NOTE — Transfer of Care (Signed)
Immediate Anesthesia Transfer of Care Note  Patient: Greg [name]  Procedure(s) Performed: ESOPHAGOGASTRODUODENOSCOPY (EGD) WITH PROPOFOL HOT HEMOSTASIS (ARGON PLASMA COAGULATION/BICAP)  Patient Location: Endoscopy Unit  Anesthesia Type:MAC  Level of Consciousness: drowsy and patient cooperative  Airway & Oxygen Therapy: Patient Spontanous Breathing and Patient connected to nasal cannula oxygen  Post-op Assessment: Report given to RN and Post -op Vital signs reviewed and stable  Post vital signs: Reviewed and stable  Last Vitals:  Vitals Value Taken Time  BP    Temp    Pulse 89 01/19/22 0851  Resp 15 01/19/22 0851  SpO2 100 % 01/19/22 0851  Vitals shown include unvalidated device data.  Last Pain:  Vitals:   01/19/22 0752  TempSrc: Temporal  PainSc: 0-No pain         Complications: No notable events documented.

## 2022-01-19 NOTE — Anesthesia Postprocedure Evaluation (Signed)
Anesthesia Post Note  Patient: Greg [name]  Procedure(s) Performed: ESOPHAGOGASTRODUODENOSCOPY (EGD) WITH PROPOFOL HOT HEMOSTASIS (ARGON PLASMA COAGULATION/BICAP)     Patient location during evaluation: Endoscopy Anesthesia Type: MAC Level of consciousness: awake and alert, patient cooperative and oriented Pain management: pain level controlled Vital Signs Assessment: post-procedure vital signs reviewed and stable Respiratory status: nonlabored ventilation, spontaneous breathing, respiratory function stable and patient connected to nasal cannula oxygen Cardiovascular status: blood pressure returned to baseline and stable Postop Assessment: no apparent nausea or vomiting Anesthetic complications: no   No notable events documented.  Last Vitals:  Vitals:   01/19/22 0925 01/19/22 0945  BP: 94/67 101/72  Pulse: 94 88  Resp: 18 16  Temp:    SpO2: 100% 100%    Last Pain:  Vitals:   01/19/22 0925  TempSrc:   PainSc: 0-No pain                 Berda Shelvin,E. Kerilyn Cortner

## 2022-01-20 DIAGNOSIS — J9601 Acute respiratory failure with hypoxia: Secondary | ICD-10-CM | POA: Diagnosis not present

## 2022-01-20 DIAGNOSIS — I255 Ischemic cardiomyopathy: Secondary | ICD-10-CM | POA: Diagnosis not present

## 2022-01-20 DIAGNOSIS — K921 Melena: Secondary | ICD-10-CM

## 2022-01-20 DIAGNOSIS — I5021 Acute systolic (congestive) heart failure: Secondary | ICD-10-CM | POA: Diagnosis not present

## 2022-01-20 DIAGNOSIS — D500 Iron deficiency anemia secondary to blood loss (chronic): Secondary | ICD-10-CM

## 2022-01-20 DIAGNOSIS — K31819 Angiodysplasia of stomach and duodenum without bleeding: Secondary | ICD-10-CM

## 2022-01-20 LAB — BPAM RBC
Blood Product Expiration Date: 202401012359
Blood Product Expiration Date: 202401012359
Blood Product Expiration Date: 202401012359
ISSUE DATE / TIME: 202312080934
ISSUE DATE / TIME: 202312081150
ISSUE DATE / TIME: 202312091616
Unit Type and Rh: 7300
Unit Type and Rh: 7300
Unit Type and Rh: 7300

## 2022-01-20 LAB — TYPE AND SCREEN
ABO/RH(D): B POS
Antibody Screen: NEGATIVE
Unit division: 0
Unit division: 0
Unit division: 0

## 2022-01-20 LAB — BASIC METABOLIC PANEL
Anion gap: 11 (ref 5–15)
BUN: 14 mg/dL (ref 8–23)
CO2: 26 mmol/L (ref 22–32)
Calcium: 8.6 mg/dL — ABNORMAL LOW (ref 8.9–10.3)
Chloride: 97 mmol/L — ABNORMAL LOW (ref 98–111)
Creatinine, Ser: 1.13 mg/dL (ref 0.61–1.24)
GFR, Estimated: >60 mL/min (ref >60)
Glucose, Bld: 103 mg/dL — ABNORMAL HIGH (ref 70–99)
Potassium: 4.2 mmol/L (ref 3.5–5.1)
Sodium: 134 mmol/L — ABNORMAL LOW (ref 135–145)

## 2022-01-20 LAB — PHOSPHORUS: Phosphorus: 3.4 mg/dL (ref 2.5–4.6)

## 2022-01-20 LAB — MAGNESIUM: Magnesium: 2.2 mg/dL (ref 1.7–2.4)

## 2022-01-20 LAB — PROCALCITONIN: Procalcitonin: 0.33 ng/mL

## 2022-01-20 LAB — HEMOGLOBIN AND HEMATOCRIT, BLOOD
HCT: 26.2 % — ABNORMAL LOW (ref 39.0–52.0)
Hemoglobin: 8.3 g/dL — ABNORMAL LOW (ref 13.0–17.0)

## 2022-01-20 MED ORDER — IPRATROPIUM-ALBUTEROL 0.5-2.5 (3) MG/3ML IN SOLN
3.0000 mL | Freq: Two times a day (BID) | RESPIRATORY_TRACT | Status: DC
  Administered 2022-01-20 – 2022-01-21 (×2): 3 mL via RESPIRATORY_TRACT
  Filled 2022-01-20 (×2): qty 3

## 2022-01-20 MED ORDER — POLYETHYLENE GLYCOL 3350 17 GM/SCOOP PO POWD
0.5000 | Freq: Once | ORAL | Status: AC
  Administered 2022-01-20: 127.5 g via ORAL
  Filled 2022-01-20 (×2): qty 255

## 2022-01-20 MED ORDER — LOSARTAN POTASSIUM 25 MG PO TABS
12.5000 mg | ORAL_TABLET | Freq: Every day | ORAL | Status: DC
  Administered 2022-01-20: 12.5 mg via ORAL
  Filled 2022-01-20: qty 1

## 2022-01-20 MED ORDER — FUROSEMIDE 10 MG/ML IJ SOLN
40.0000 mg | Freq: Two times a day (BID) | INTRAMUSCULAR | Status: DC
  Administered 2022-01-20: 40 mg via INTRAVENOUS
  Filled 2022-01-20: qty 4

## 2022-01-20 MED ORDER — SPIRONOLACTONE 12.5 MG HALF TABLET
12.5000 mg | ORAL_TABLET | Freq: Every day | ORAL | Status: DC
  Administered 2022-01-20: 12.5 mg via ORAL
  Filled 2022-01-20: qty 1

## 2022-01-20 MED ORDER — EMPAGLIFLOZIN 10 MG PO TABS
10.0000 mg | ORAL_TABLET | Freq: Every day | ORAL | Status: DC
  Administered 2022-01-20 – 2022-01-26 (×7): 10 mg via ORAL
  Filled 2022-01-20 (×8): qty 1

## 2022-01-20 NOTE — Progress Notes (Addendum)
NAME:  Greg [name], MRN:  [id number], DOB:  [birth year], LOS: 2 ADMISSION DATE:  01/18/2022, CONSULTATION DATE:  01/18/2022 REFERRING MD:  Dr. Lorin Mercy, CHIEF COMPLAINT: Shortness of breath    History of Present Illness:  71 year old male with recent admission 11/7-11/13 with upper GI bleed. Underwent EGD which showed a non-bleeding ulcer. Discharged on PPI. Stay complicated with concern for NSTEMI, ECHO with EF 35-40% and grade 2 DD.   Presents to ED 12/8 with reported shortness of breath. States that fatigue and shortness of breath progressed and became worse yesterday where he couldn't even get up to go to bathroom. On arrival to ED patient with tachypnea, tachycardia, and accessory muscle use with diminished breath sounds. CXR with no acute. Found to have hemoglobin of 4.4 (10.4 on discharge 3 weeks prior). Hemoccult positive. Reports dark stools for a couple weeks, denies abdominal pain. GI consulted with plans for EGD.   12/8 afternoon patient completed 2nd unit of RBC when he was complaining of increased shortness of breath. Given 20 mg lasix. Increased supplemental oxygen to 4L. Progressive distress with accessory muscle use. Given nebs and placed on BiPAP.  Critical care consulted.   Pertinent  Medical History  AAA, HTN, Gastric Ulcer, Barrett's esophagus   Significant Hospital Events: Including procedures, antibiotic start and stop dates in addition to other pertinent events   12/8 > Presents to ED  12/9 EGD shows 2 areas of bleeding AVM which were cauterized by laser. 12/8 echocardiogram shows reduced EF at 35 to 40%.  Interim History / Subjective:  Denies abdominal pain.  Feels better.  No nausea.  No shortness of breath. Not passed stool.  Objective   Blood pressure 115/82, pulse 91, temperature 98 F (36.7 C), resp. rate 19, height '5\' 8"'$  (1.727 m), weight 61.2 kg, SpO2 100 %.    FiO2 (%):  [28 %-40 %] 28 %   Intake/Output Summary (Last 24 hours) at 01/20/2022 1552 Last  data filed at 01/20/2022 0000 Gross per 24 hour  Intake 625.18 ml  Output 1050 ml  Net -424.82 ml    Filed Weights   01/18/22 0554  Weight: 61.2 kg   Examination: General: Lying in bed in no distress.   HENT: Oropharynx intact.  No pressure injuries Lungs: Crackles at both bases Cardiovascular: Normal heart sounds Abdomen: soft, non-tender, active bowel sounds  Extremities: +2 BLE edema Neuro: alert, follows commands, anxious  GU: with enlarged scrotum copious urine production  Ancillary tests personally reviewed  Normal electrolytes, normal renal function Elevated BNP at 1129 Troponin peak 345 Lactic acid has cleared 1.1 Hemoglobin 8.3 posttransfusion. Platelet count 68  Assessment & Plan:   Acute Hypoxic Respiratory Distress in setting of AECOPD +/- pulmonary edema - CT chest 11/7 with advanced emphysema and 4 mm nodule in right middle lobe   -Has improved with diuresis. -Incentive spirometry and progressive ambulation.  Ongoing Tobacco Abuse Plan -Transition to maintenance COPD therapy  Decompensated Heart Failure - ECHO 11/8 EF 35-40% and grade 2 DD H/O HTN, HLD  -Continues to diurese -Neurology consultation needs initiation of guideline directed heart failure therapy.  Blood loss anemia, post AVM cauterization -No signs of active bleeding.  Transfuse for hemoglobin less than 7. -For colonoscopy tomorrow.  Enlarged right inguinal hernia extending into scrotal sac  Plan - Patient refused surgery on last admission  - Should re-address when stable   Ready for transfer hospitalist notified.  Best Practice (right click and "Reselect all SmartList Selections" daily)  Diet/type: Full diet. DVT prophylaxis: SCD GI prophylaxis: PPI Lines: N/A Foley:  Yes, and it is still needed Code Status:  full code Last date of multidisciplinary goals of care discussion [discussed code status with patient. Wishes to remain Full Code at this time.]  Kipp Brood, MD  [facility] ICU Physician [place]  Pager: [phone] Or Epic Secure Chat After hours: [phone].  01/20/2022, 3:52 PM

## 2022-01-20 NOTE — Consult Note (Signed)
Cardiology Consultation   Patient ID: Greg [name] MRN: [id number]; DOB: [birth year]  Admit date: 01/18/2022 Date of Consult: 01/20/2022  PCP:  Merryl Hacker No   Greg [name] Cardiologist:  Buford Dresser, MD        Patient Profile:   Greg [name] is a 71 y.o. male with a hx of AAA, hypertension, cognitive impairment, and iron deficiency anemia, who is being seen 01/20/2022 for the evaluation of CHF at the request of Dr Lynetta Mare.  History of Present Illness:   Greg [name] had not seen Cards here before. We saw him in consult during his 11/07-11/13 admit for GIB w/ initial Hgb 6.1.  Trop at that time 589, EF 35-40% +WMA, no cath due to unable to use DAPT 2nd GIB. F/u as outpt  He has appt 12/12.  CAD was listed as a diagnosis 11/06/2021 PCP visit, but pt denies this and there is no supporting documentation.  He was admitted 12/09 with SOB, Hgb 4.4, heme +. After 2 U PRBCs, increased SOB, Lasix 20 mg IV given, O2 increased >> BiPAP & nebs.   He got 2 more doses of Lasix 20 mg and diuresed to the point that he is now on room air.  He is resting comfortably.  He put out almost 4 L of urine.  Greg [name] is not able to provide much information.  He says that he gets chest pain.  He cannot tell me when his last episode was.  He cannot describe the pain.  He cannot rate the pain.  When I ask about chest pain, he points to his abdomen, but when redirected to his chest, he still says he has chest pain.  He is not able to get to the store on his own, he gets someone to drive him.  His sister is present and says that she takes him to the store.  She also says that he will not allow her to give him additional help such as cleaning the house or making sure his clothes are clean.  According to his sister, he has not had a drink in a long time, but continues to smoke    Past Medical History:  Diagnosis Date   Greg [name]'s esophagus    Gastric ulcer     Past Surgical  History:  Procedure Laterality Date   BIOPSY  12/20/2021   Procedure: BIOPSY;  Surgeon: Thornton Park, MD;  Location: [facility];  Service: Gastroenterology;;   ESOPHAGOGASTRODUODENOSCOPY (EGD) WITH PROPOFOL N/A 12/20/2021   Procedure: ESOPHAGOGASTRODUODENOSCOPY (EGD) WITH PROPOFOL;  Surgeon: Thornton Park, MD;  Location: [facility];  Service: Gastroenterology;  Laterality: N/A;     Home Medications:  Prior to Admission medications   Medication Sig Start Date End Date Taking? Authorizing Provider  acetaminophen (TYLENOL) 325 MG tablet Take 2 tablets (650 mg total) by mouth every 6 (six) hours as needed for mild pain (or Fever >/= 101). 12/24/21  Yes Eugenie Filler, MD  aspirin EC 81 MG tablet Take 1 tablet (81 mg total) by mouth daily. Swallow whole. 12/25/21  Yes Eugenie Filler, MD  atorvastatin (LIPITOR) 40 MG tablet Take 1 tablet (40 mg total) by mouth daily. 12/25/21  Yes Eugenie Filler, MD  lisinopril (ZESTRIL) 5 MG tablet Take 5 mg by mouth daily. 01/08/22  Yes [provider]  losartan (COZAAR) 50 MG tablet Take 1 tablet (50 mg total) by mouth daily. 12/25/21  Yes Eugenie Filler, MD  OVER THE COUNTER MEDICATION Over the counter  pain relief   Yes [provider]  pantoprazole (PROTONIX) 40 MG tablet Take 1 tablet (40 mg total) by mouth 2 (two) times daily. 12/24/21  Yes Eugenie Filler, MD    Inpatient Medications: Scheduled Meds:  sodium chloride   Intravenous Once   atorvastatin  40 mg Oral Daily   budesonide (PULMICORT) nebulizer solution  0.5 mg Nebulization BID   Chlorhexidine Gluconate Cloth  6 each Topical Daily   empagliflozin  10 mg Oral Daily   ipratropium-albuterol  3 mL Nebulization BID   losartan  12.5 mg Oral Daily   nicotine  14 mg Transdermal Daily   [START ON 01/21/2022] pantoprazole  40 mg Intravenous Q12H   sodium chloride flush  3 mL Intravenous Q12H   spironolactone  12.5 mg Oral Daily   Continuous  Infusions:  sodium chloride     ferric gluconate (FERRLECIT) IVPB 250 mg (01/20/22 1144)   PRN Meds: acetaminophen **OR** acetaminophen, albuterol, hydrALAZINE, ondansetron **OR** ondansetron (ZOFRAN) IV  Allergies:   No Known Allergies  Social History:   Social History   Socioeconomic History   Marital status: Legally Separated    Spouse name: Not on file   Number of children: Not on file   Years of education: Not on file   Highest education level: Not on file  Occupational History   Occupation: retired  Tobacco Use   Smoking status: Every Day    Packs/day: 1.00    Types: Cigarettes   Smokeless tobacco: Never  Vaping Use   Vaping Use: Never used  Substance and Sexual Activity   Alcohol use: Not Currently    Comment: former   Drug use: Yes    Types: Marijuana    Comment: "partially"   Sexual activity: Not on file  Other Topics Concern   Not on file  Social History Narrative   Not on file   Social Determinants of Health   Financial Resource Strain: Not on file  Food Insecurity: Not on file  Transportation Needs: Not on file  Physical Activity: Not on file  Stress: Not on file  Social Connections: Not on file  Intimate Partner Violence: Not on file    Family History:   Family History  Problem Relation Age of Onset   Stomach cancer Neg Hx    Colon cancer Neg Hx    Esophageal cancer Neg Hx    Pancreatic cancer Neg Hx      ROS:  Please see the history of present illness.  All other ROS reviewed and negative.     Physical Exam/Data:   Vitals:   01/20/22 0900 01/20/22 1000 01/20/22 1100 01/20/22 1218  BP: 103/67 117/81 115/82   Pulse: 89 95 91   Resp: '12 17 19   '$ Temp:    98 F (36.7 C)  TempSrc:      SpO2: 99% 100% 100%   Weight:      Height:        Intake/Output Summary (Last 24 hours) at 01/20/2022 1354 Last data filed at 01/20/2022 0000 Gross per 24 hour  Intake 625.18 ml  Output 1050 ml  Net -424.82 ml      01/18/2022    5:54 AM  12/24/2021    5:22 AM 12/23/2021    5:05 AM  Last 3 Weights  Weight (lbs) 135 lb 131 lb 6.3 oz 129 lb 6.6 oz  Weight (kg) 61.236 kg 59.6 kg 58.7 kg     Body mass index is 20.53 kg/m.  General: Slender, poorly nourished, chronically ill-appearing male, in no acute distress HEENT: normal for age with extremely poor dentition Neck: JVD to jaw Vascular: No carotid bruits; Distal pulses 2+ bilaterally Cardiac:  normal S1, S2; RRR; no murmur  Lungs:  clear to auscultation bilaterally, no wheezing, rhonchi or rales  Abd: soft, nontender, no hepatomegaly  Ext: no edema Musculoskeletal:  No deformities, BUE and BLE strength normal and equal Skin: warm and dry  Neuro:  CNs 2-12 intact, no focal abnormalities noted Psych:  Normal affect   EKG:  The EKG was personally reviewed and demonstrates: Sinus tachycardia, heart rate 105, inferolateral Q waves Telemetry:  Telemetry was personally reviewed and demonstrates: Sinus rhythm, sinus tach  Relevant CV Studies:  EGD: 01/18/2022 - Z-line regular, 36 cm from the incisors. - No gross lesions in the entire esophagus. - A single bleeding angioectasia in the stomach. Treated with argon plasma coagulation - A single angioectasia in the duodenum. Treated with argon plasma coagulation (APC). - A single angioectasia in the duodenum. Treated with argon plasma coagulation (APC). - No specimens collected. Recommendation: - Mechanical soft diet. - Continue present medications. - Pantoprazole '40mg'$  BID - IV Ferraheme X 2 for severe iron deficiency anemia - Vit B 12 injection 1000 mcg - Please consult hematology for pancytopenia, severe thrombocytopenia (No evidence of cirrhosis or portal HTN based on imaging or EGD)   ECHO: 12/19/2021 1. Left ventricular ejection fraction, by estimation, is 35 to 40%. The left ventricle has moderately decreased function. The left ventricle demonstrates regional wall motion abnormalities with severe hypokinesis of the  mid to apical anteroseptal, inferoseptal, and anterior walls. Apical hypokinesis. This suggests LAD territory infarction. Left ventricular diastolic parameters are consistent with Grade II diastolic dysfunction (pseudonormalization). 2. Right ventricular systolic function is normal. The right ventricular size is normal. Tricuspid regurgitation signal is inadequate for assessing PA pressure. 3. The mitral valve is normal in structure. Mild mitral valve regurgitation. No evidence of mitral stenosis. 4. The aortic valve was not well visualized. Aortic valve regurgitation is not visualized. No aortic stenosis is present. 5. The inferior vena cava is dilated in size with <50% respiratory variability, suggesting right atrial pressure of 15 mmHg.  Laboratory Data:  High Sensitivity Troponin:   Recent Labs  Lab 12/24/21 1507 01/18/22 0606 01/18/22 0817 01/18/22 2126 01/19/22 0602  TROPONINIHS 11 78* 79* 345* 296*     Chemistry Recent Labs  Lab 01/18/22 0606 01/18/22 1854 01/18/22 2126 01/19/22 0602 01/20/22 0033  NA 137 140 138 139  --   K 3.9 3.9 3.6 3.7  --   CL 106  --  102 103  --   CO2 21*  --  24 25  --   GLUCOSE 120*  --  172* 127*  --   BUN 18  --  18 13  --   CREATININE 0.99  --  1.21 1.06  --   CALCIUM 8.7*  --  8.9 8.7*  --   MG  --   --  1.9 2.0 2.2  GFRNONAA >60  --  >60 >60  --   ANIONGAP 10  --  12 11  --     Recent Labs  Lab 01/18/22 2126  PROT 7.9  ALBUMIN 3.9  AST 37  ALT 28  ALKPHOS 52  BILITOT 0.8   Lipids No results for input(s): "CHOL", "TRIG", "HDL", "LABVLDL", "LDLCALC", "CHOLHDL" in the last 168 hours.  Hematology Recent Labs  Lab 01/18/22 2126 01/19/22 0602 01/19/22  1245 01/20/22 0033  WBC 9.3 4.4 6.6  --   RBC 3.29* 2.91* 2.78*  --   HGB 8.3* 7.2* 6.9* 8.3*  HCT 25.9* 23.0* 22.4* 26.2*  MCV 78.7* 79.0* 80.6  --   MCH 25.2* 24.7* 24.8*  --   MCHC 32.0 31.3 30.8  --   RDW 19.1* 18.9* 18.8*  --   PLT 80* 60* 68*  --    Thyroid No  results for input(s): "TSH", "FREET4" in the last 168 hours.  BNP Recent Labs  Lab 01/18/22 0606 01/18/22 2126 01/19/22 0602  BNP 755.6* 1,211.3* 1,129.8*    DDimer No results for input(s): "DDIMER" in the last 168 hours. Iron/TIBC/Ferritin/ %Sat    Component Value Date/Time   IRON 14 (L) 12/18/2021 1125   TIBC 575.4 (H) 12/18/2021 1125   FERRITIN 6.7 (L) 12/18/2021 1125   IRONPCTSAT 2.4 (L) 12/18/2021 1125     Radiology/Studies:  DG Chest Portable 1 View  Result Date: 01/18/2022 CLINICAL DATA:  Shortness of breath. EXAM: PORTABLE CHEST 1 VIEW COMPARISON:  January 18, 2022 FINDINGS: EKG leads project over the chest. Cardiomediastinal contours and hilar structures are stable. Diffuse interstitial prominence has developed since the study of the same date at 6:08 a.m. No signs of pleural effusion or pneumothorax. No lobar level consolidative process. Areas most pronounced at the medial lung bases with respect to interstitial and alveolar changes. On limited assessment no acute skeletal findings. IMPRESSION: Diffuse interstitial prominence has developed since the study of the same date at 6:08 AM. Would consider the possibility of pulmonary edema, aspiration changes or atypical infection. Electronically Signed   By: Zetta Bills M.D.   On: 01/18/2022 16:12   DG Chest Port 1 View  Result Date: 01/18/2022 CLINICAL DATA:  Shortness of breath. EXAM: PORTABLE CHEST 1 VIEW COMPARISON:  12/18/2021 FINDINGS: 0608 hours. Lungs are hyperexpanded. The lungs are clear without focal pneumonia, edema, pneumothorax or pleural effusion. Cardiopericardial silhouette is at upper limits of normal for size. The visualized bony structures of the thorax are unremarkable. Telemetry leads overlie the chest. IMPRESSION: Hyperexpansion without acute cardiopulmonary findings. Electronically Signed   By: Misty Stanley M.D.   On: 01/18/2022 06:22     Assessment and Plan:   Acute on chronic combined CHF -Needs  additional diuresis, give Lasix Lasix 40 mg IV twice daily, may be able to change to oral meds in a day or 2 -Add losartan and spironolactone as well as Jardiance for GDMT -If he tolerates his medications, tomorrow had a beta-blocker -Encourage compliance with medications, he will need help with this -He needs to keep his follow-up on December 12  2.  CAD -Based on his echo, he has almost certainly had an LAD infarct sometime in the past -However, based on his troponins during this admission and previous admission, it happened prior to that -At this time, based on multiple issues, he is not a candidate for cath or intervention, medical therapy for CAD and monitor for chest pain  3. 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  New York Heart Association (NYHA) Functional Class NYHA Class IV   For questions  or updates, please contact [facility] Please consult [email or web address] for contact info under    Signed, Rosaria Ferries, PA-C  01/20/2022 1:54 PM

## 2022-01-20 NOTE — Progress Notes (Addendum)
[facility] Progress Note  CC:  Anemia  Subjective:  Feels fine.  No new complaints.  Objective:  Vital signs in last 24 hours: Temp:  [98.1 F (36.7 C)-98.9 F (37.2 C)] 98.9 F (37.2 C) (12/10 0000) Pulse Rate:  [82-116] 98 (12/10 0814) Resp:  [12-25] 22 (12/10 0814) BP: (87-132)/(56-86) 107/69 (12/10 0715) SpO2:  [95 %-100 %] 98 % (12/10 0814) FiO2 (%):  [28 %-40 %] 28 % (12/10 0814) Last BM Date : 01/18/22 General:  Alert, chronically ill-appearing, in NAD Heart:  Regular rate and rhythm; no murmurs Pulm:  CTAB.  No W/R/R. Abdomen:  Soft, non-distended.  BS present.  Non-tender. Extremities:  Bilateral lower extremities with excessive skin peeling.    Intake/Output from previous day: 12/09 0701 - 12/10 0700 In: 825.2 [I.V.:207.2; Blood:347.5; IV Piggyback:270.5] Out: 1050 [Urine:1050]  Lab Results: Recent Labs    01/18/22 2126 01/19/22 0602 01/19/22 1245 01/20/22 0033  WBC 9.3 4.4 6.6  --   HGB 8.3* 7.2* 6.9* 8.3*  HCT 25.9* 23.0* 22.4* 26.2*  PLT 80* 60* 68*  --    BMET Recent Labs    01/18/22 0606 01/18/22 1854 01/18/22 2126 01/19/22 0602  NA 137 140 138 139  K 3.9 3.9 3.6 3.7  CL 106  --  102 103  CO2 21*  --  24 25  GLUCOSE 120*  --  172* 127*  BUN 18  --  18 13  CREATININE 0.99  --  1.21 1.06  CALCIUM 8.7*  --  8.9 8.7*   LFT Recent Labs    01/18/22 2126  PROT 7.9  ALBUMIN 3.9  AST 37  ALT 28  ALKPHOS 52  BILITOT 0.8  BILIDIR 0.2  IBILI 0.6   PT/INR Recent Labs    01/18/22 2126  LABPROT 16.2*  INR 1.3*   DG Chest Portable 1 View  Result Date: 01/18/2022 CLINICAL DATA:  Shortness of breath. EXAM: PORTABLE CHEST 1 VIEW COMPARISON:  January 18, 2022 FINDINGS: EKG leads project over the chest. Cardiomediastinal contours and hilar structures are stable. Diffuse interstitial prominence has developed since the study of the same date at 6:08 a.m. No signs of pleural effusion or pneumothorax. No lobar level  consolidative process. Areas most pronounced at the medial lung bases with respect to interstitial and alveolar changes. On limited assessment no acute skeletal findings. IMPRESSION: Diffuse interstitial prominence has developed since the study of the same date at 6:08 AM. Would consider the possibility of pulmonary edema, aspiration changes or atypical infection. Electronically Signed   By: Zetta Bills M.D.   On: 01/18/2022 16:12    Assessment / Plan: 59) 71 year old male admitted to the hospital with profound iron deficiency anemia reported dark stools with positive FOBT.   Had non-bleeding GU on EGD when seen in November for the same reason.  EGD 12/9 with a single gastric and a couple of duodenal AVMs that were APC.  He likely has others in the rest of the small bowel and maybe colon.  Hgb 8.3 grams this AM after 3 units PRBCs this admission.   2) History of coronary artery disease, ? past anterior/septal MI per EKG. Elevated troponin levels, NSTEMI vs demand ischemia in setting of profound anemia.  Echo abnormal.    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  Patient was previously evaluated by general surgery and he declined surgical  intervention.   5) Acute hypoxic respiratory distress in setting of COPD   -He is agreeable to colonoscopy and VCE for Tuesday. -Trend Hgb and transfuse further prn. -IV iron and consider heme consult.    LOS: 2 days   Laban Emperor. Zehr  01/20/2022, 9:48 AM   Attending physician's note   I have taken a history, reviewed the chart and examined the patient. I performed a substantive portion of this encounter, including complete performance of at least one of the key components, in conjunction with the APP. I agree with the APP's note, impression and recommendations.    Admitted with recurrent severe iron deficiency anemia  Status post EGD  yesterday with single gastric and 2 small diminutive duodenal AVMs treated with APC  Noted further decline in hemoglobin, he was transfused PRBC overnight and he was also placed on BiPAP  Patient is agreeable to undergo colonoscopy, will tentatively plan for Tuesday.  Slow bowel prep with MiraLAX for 2 days Monitor hemoglobin and transfuse as needed If colonoscopy unrevealing, will need small bowel video capsule study   The patient was provided an opportunity to ask questions and all were answered. The patient agreed with the plan and demonstrated an understanding of the instructions.   Damaris Hippo , MD [phone]

## 2022-01-21 ENCOUNTER — Encounter (HOSPITAL_COMMUNITY): Payer: Self-pay | Admitting: Gastroenterology

## 2022-01-21 ENCOUNTER — Other Ambulatory Visit (HOSPITAL_COMMUNITY): Payer: Self-pay

## 2022-01-21 ENCOUNTER — Telehealth: Payer: Self-pay | Admitting: Pulmonary Disease

## 2022-01-21 DIAGNOSIS — I5043 Acute on chronic combined systolic (congestive) and diastolic (congestive) heart failure: Secondary | ICD-10-CM

## 2022-01-21 DIAGNOSIS — J432 Centrilobular emphysema: Secondary | ICD-10-CM

## 2022-01-21 DIAGNOSIS — Z720 Tobacco use: Secondary | ICD-10-CM | POA: Diagnosis not present

## 2022-01-21 DIAGNOSIS — R911 Solitary pulmonary nodule: Secondary | ICD-10-CM | POA: Diagnosis not present

## 2022-01-21 DIAGNOSIS — R195 Other fecal abnormalities: Secondary | ICD-10-CM

## 2022-01-21 DIAGNOSIS — D509 Iron deficiency anemia, unspecified: Secondary | ICD-10-CM

## 2022-01-21 DIAGNOSIS — K922 Gastrointestinal hemorrhage, unspecified: Secondary | ICD-10-CM | POA: Diagnosis not present

## 2022-01-21 LAB — CBC WITH DIFFERENTIAL/PLATELET
Abs Immature Granulocytes: 0.04 10*3/uL (ref 0.00–0.07)
Basophils Absolute: 0 10*3/uL (ref 0.0–0.1)
Basophils Relative: 1 %
Eosinophils Absolute: 0.2 10*3/uL (ref 0.0–0.5)
Eosinophils Relative: 3 %
HCT: 27.7 % — ABNORMAL LOW (ref 39.0–52.0)
Hemoglobin: 9.1 g/dL — ABNORMAL LOW (ref 13.0–17.0)
Immature Granulocytes: 1 %
Lymphocytes Relative: 19 %
Lymphs Abs: 1.3 10*3/uL (ref 0.7–4.0)
MCH: 26.8 pg (ref 26.0–34.0)
MCHC: 32.9 g/dL (ref 30.0–36.0)
MCV: 81.7 fL (ref 80.0–100.0)
Monocytes Absolute: 0.8 10*3/uL (ref 0.1–1.0)
Monocytes Relative: 12 %
Neutro Abs: 4.5 10*3/uL (ref 1.7–7.7)
Neutrophils Relative %: 64 %
Platelets: 60 10*3/uL — ABNORMAL LOW (ref 150–400)
RBC: 3.39 MIL/uL — ABNORMAL LOW (ref 4.22–5.81)
RDW: 18.2 % — ABNORMAL HIGH (ref 11.5–15.5)
WBC: 6.9 10*3/uL (ref 4.0–10.5)
nRBC: 0 % (ref 0.0–0.2)

## 2022-01-21 LAB — LIPID PANEL
Cholesterol: 88 mg/dL (ref 0–200)
HDL: 32 mg/dL — ABNORMAL LOW (ref >40)
LDL Cholesterol: 44 mg/dL (ref 0–99)
Total CHOL/HDL Ratio: 2.8 RATIO
Triglycerides: 58 mg/dL (ref <150)
VLDL: 12 mg/dL (ref 0–40)

## 2022-01-21 LAB — BASIC METABOLIC PANEL
Anion gap: 11 (ref 5–15)
BUN: 12 mg/dL (ref 8–23)
CO2: 25 mmol/L (ref 22–32)
Calcium: 8.6 mg/dL — ABNORMAL LOW (ref 8.9–10.3)
Chloride: 98 mmol/L (ref 98–111)
Creatinine, Ser: 1.02 mg/dL (ref 0.61–1.24)
GFR, Estimated: >60 mL/min (ref >60)
Glucose, Bld: 101 mg/dL — ABNORMAL HIGH (ref 70–99)
Potassium: 3.9 mmol/L (ref 3.5–5.1)
Sodium: 134 mmol/L — ABNORMAL LOW (ref 135–145)

## 2022-01-21 LAB — HEMOGLOBIN A1C
Hgb A1c MFr Bld: 5.4 % (ref 4.8–5.6)
Mean Plasma Glucose: 108 mg/dL

## 2022-01-21 LAB — LACTIC ACID, PLASMA: Lactic Acid, Venous: 1 mmol/L (ref 0.5–1.9)

## 2022-01-21 LAB — TSH: TSH: 0.945 u[IU]/mL (ref 0.350–4.500)

## 2022-01-21 LAB — BRAIN NATRIURETIC PEPTIDE: B Natriuretic Peptide: 560 pg/mL — ABNORMAL HIGH (ref 0.0–100.0)

## 2022-01-21 MED ORDER — LACTATED RINGERS IV BOLUS
500.0000 mL | Freq: Once | INTRAVENOUS | Status: AC
  Administered 2022-01-21: 500 mL via INTRAVENOUS

## 2022-01-21 MED ORDER — PEG-KCL-NACL-NASULF-NA ASC-C 100 G PO SOLR: 1.0000 | Freq: Once | ORAL | Status: DC

## 2022-01-21 MED ORDER — BISACODYL 5 MG PO TBEC
20.0000 mg | DELAYED_RELEASE_TABLET | Freq: Once | ORAL | Status: AC
  Administered 2022-01-21: 20 mg via ORAL
  Filled 2022-01-21: qty 4

## 2022-01-21 MED ORDER — PEG-KCL-NACL-NASULF-NA ASC-C 100 G PO SOLR
0.5000 | Freq: Once | ORAL | Status: AC
  Administered 2022-01-21: 100 g via ORAL
  Filled 2022-01-21: qty 1

## 2022-01-21 MED ORDER — UMECLIDINIUM BROMIDE 62.5 MCG/ACT IN AEPB
1.0000 | INHALATION_SPRAY | Freq: Every day | RESPIRATORY_TRACT | Status: DC
  Administered 2022-01-21 – 2022-01-25 (×5): 1 via RESPIRATORY_TRACT
  Filled 2022-01-21: qty 7

## 2022-01-21 MED ORDER — METOCLOPRAMIDE HCL 5 MG/ML IJ SOLN
10.0000 mg | Freq: Four times a day (QID) | INTRAMUSCULAR | Status: AC
  Administered 2022-01-21 (×2): 10 mg via INTRAVENOUS
  Filled 2022-01-21 (×2): qty 2

## 2022-01-21 MED ORDER — FUROSEMIDE 40 MG PO TABS
40.0000 mg | ORAL_TABLET | Freq: Every day | ORAL | Status: DC
  Administered 2022-01-21 – 2022-01-24 (×4): 40 mg via ORAL
  Filled 2022-01-21 (×4): qty 1

## 2022-01-21 MED ORDER — ALBUTEROL SULFATE (2.5 MG/3ML) 0.083% IN NEBU: 2.5000 mg | INHALATION_SOLUTION | RESPIRATORY_TRACT | Status: DC | PRN

## 2022-01-21 MED ORDER — FLUTICASONE FUROATE-VILANTEROL 100-25 MCG/ACT IN AEPB
1.0000 | INHALATION_SPRAY | Freq: Every day | RESPIRATORY_TRACT | Status: DC
  Administered 2022-01-21 – 2022-01-25 (×5): 1 via RESPIRATORY_TRACT
  Filled 2022-01-21: qty 28

## 2022-01-21 MED ORDER — PANTOPRAZOLE SODIUM 40 MG PO TBEC
40.0000 mg | DELAYED_RELEASE_TABLET | Freq: Every day | ORAL | Status: DC
  Administered 2022-01-22 – 2022-01-26 (×5): 40 mg via ORAL
  Filled 2022-01-21 (×5): qty 1

## 2022-01-21 NOTE — Progress Notes (Signed)
PROGRESS NOTE    Greg [name]  LOV:[id number] DOB: 07-06-1950 DOA: 01/18/2022 PCP: Pcp, No   Brief Narrative:  71 year old male with recent admission 11/7-11/13 with upper GI bleed. Underwent EGD which showed a non-bleeding ulcer. Discharged on PPI.    Presents to ED 12/8 with reported shortness of breath. On arrival to ED patient with tachypnea, tachycardia, and accessory muscle use with diminished breath sounds. CXR with no acute. Found to have hemoglobin of 4.4 (10.4 on discharge 3 weeks prior). Hemoccult positive. Reports dark stools for a couple weeks, denies abdominal pain.  Patient admitted under hospital service and GI consulted.    12/8 afternoon patient completed 2nd unit of RBC when he was complaining of increased shortness of breath. Given 20 mg lasix. Increased supplemental oxygen to 4L. Progressive distress with accessory muscle use. Given nebs and placed on BiPAP.  Critical care consulted and patient was transferred to ICU. Significant Hospital Events: Including procedures, antibiotic start and stop dates in addition to other pertinent events   12/8 > Presents to ED  12/9 EGD shows 2 areas of bleeding AVM which were cauterized by laser. 12/8 echocardiogram shows reduced EF at 35 to 40%. 12/11.  Transferred under [facility] Assessment & Plan:   Principal Problem:   Acute upper GI bleeding Active Problems:   AAA (abdominal aortic aneurysm) (HCC)   Lung nodule   Symptomatic anemia   Essential hypertension   ABLA (acute blood loss anemia)   Tobacco dependence   Dyslipidemia   Respiratory distress   AVM (arteriovenous malformation) of duodenum, acquired  Acute hypoxic respiratory failure in the setting of acute pulmonary edema and acute exacerbation of COPD: CT chest 11 7 with advanced emphysema and 4 mm nodule in the right middle lobe.  Patient has improved with diuresis.  He is on room air and denies any shortness of breath.  Acute blood loss anemia/duodenal AVM: Presented  with hemoglobin of 4.4, received 3 units of PRBC transfusion this admission, hemoglobin over 8 today.  Underwent EGD on 01/19/2022 and was found to have single gastric and couple of duodenal AVMs that were APC.  Plan for colonoscopy tomorrow.  If colonoscopy unremarkable, will need capsule study.  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  He is asymptomatic.  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  Likely ischemic etiology.  In the past, catheterization was deferred due to concerns for cognitive impairment as well as bleeding.  He was sent home on medical therapy.  Once again, seen by cardiology and due to the above issues, they recommend medical management.  He has been started on Aldactone 12.5 mg daily, losartan 12.5 mg daily, Jardiance 10 mg.  Cardiology to likely start on beta-blocker today.  May consider Entresto.  Elevated troponin/demand ischemia/CAD with likely underlying LAD infarct:: Cardiology on board.  No plan for intervention as mentioned above.  Hyperlipidemia: Continue atorvastatin.  History of hypertension: Was taking lisinopril at home.  Currently low blood pressure.  He is asymptomatic.  Monitor closely.  DVT prophylaxis: SCDs Start: 01/18/22 1021   Code Status: Full Code  Family Communication:  None present at bedside.  Plan of care discussed with patient in length and he/she verbalized understanding and agreed with it.  Status is: Inpatient Remains inpatient appropriate because: Scheduled for colonoscopy in the morning.  Estimated body mass index is 20.53 kg/m as calculated from the following:   Height as of this encounter: '5\' 8"'$  (1.727 m).   Weight as of this encounter: 61.2 kg.    Nutritional Assessment: Body mass index is 20.53 kg/m.Marland Kitchen Seen by  dietician.  I agree with the assessment and plan as outlined below: Nutrition Status:        . Skin Assessment: I have examined the patient's skin and I agree with the wound assessment as performed by the wound care RN as outlined below:    Consultants:  Cardiology and GI  Procedures:  As above  Antimicrobials:  Anti-infectives (From admission, onward)    None         Subjective: Seen and examined.  He has no complaints.  He was alert and oriented.  Objective: Vitals:   01/21/22 0240 01/21/22 0404 01/21/22 0731 01/21/22 0818  BP: 96/71 109/72 93/62   Pulse: 83 81 81   Resp: 16 16    Temp: 98 F (36.7 C) 97.7 F (36.5 C) 98.2 F (36.8 C)   TempSrc: Axillary Oral Oral   SpO2: 98% 97% 95% 96%  Weight:      Height:        Intake/Output Summary (Last 24 hours) at 01/21/2022 1017 Last data filed at 01/21/2022 0410 Gross per 24 hour  Intake 624.01 ml  Output 3425 ml  Net -2800.99 ml   Filed Weights   01/18/22 0554  Weight: 61.2 kg    Examination:  General exam: Appears calm and comfortable  Respiratory system: Clear to auscultation. Respiratory effort normal. Cardiovascular system: S1 & S2 heard, RRR. No JVD, murmurs, rubs, gallops or clicks. No pedal edema. Gastrointestinal system: Abdomen is nondistended, soft and nontender. No organomegaly or masses felt. Normal bowel sounds heard. Central nervous system: Alert and oriented. No focal neurological deficits. Extremities: Symmetric 5 x 5 power. Skin: No rashes, lesions or ulcers  Data Reviewed: I have personally reviewed following labs and imaging studies  CBC: Recent Labs  Lab 01/18/22 0647 01/18/22 1854 01/18/22 2126 01/19/22 0602 01/19/22 1245 01/20/22 0033 01/21/22 0407  WBC 4.8  --  9.3 4.4 6.6  --  6.9  NEUTROABS 3.5  --   --   --  4.7  --  4.5  HGB 4.4*   < > 8.3* 7.2* 6.9* 8.3* 9.1*  HCT 14.6*   < > 25.9* 23.0* 22.4* 26.2* 27.7*  MCV 83.9  --  78.7* 79.0* 80.6  --  81.7  PLT 80*  --   80* 60* 68*  --  60*   < > = values in this interval not displayed.   Basic Metabolic Panel: Recent Labs  Lab 01/18/22 0606 01/18/22 1854 01/18/22 2126 01/19/22 0602 01/20/22 0033 01/20/22 1714 01/21/22 0407  NA 137 140 138 139  --  134* 134*  K 3.9 3.9 3.6 3.7  --  4.2 3.9  CL 106  --  102 103  --  97* 98  CO2 21*  --  24 25  --  26 25  GLUCOSE 120*  --  172* 127*  --  103* 101*  BUN 18  --  18 13  --  14 12  CREATININE 0.99  --  1.21 1.06  --  1.13 1.02  CALCIUM 8.7*  --  8.9 8.7*  --  8.6* 8.6*  MG  --   --  1.9 2.0 2.2  --   --   PHOS  --   --  3.7 4.2 3.4  --   --    GFR: Estimated Creatinine Clearance: 57.5 mL/min (by C-G formula based on SCr of 1.02 mg/dL). Liver Function Tests: Recent Labs  Lab 01/18/22 2126  AST 37  ALT 28  ALKPHOS 52  BILITOT 0.8  PROT 7.9  ALBUMIN 3.9   No results for input(s): "LIPASE", "AMYLASE" in the last 168 hours. No results for input(s): "AMMONIA" in the last 168 hours. Coagulation Profile: Recent Labs  Lab 01/18/22 2126  INR 1.3*   Cardiac Enzymes: No results for input(s): "CKTOTAL", "CKMB", "CKMBINDEX", "TROPONINI" in the last 168 hours. BNP (last 3 results) No results for input(s): "PROBNP" in the last 8760 hours. HbA1C: No results for input(s): "HGBA1C" in the last 72 hours. CBG: No results for input(s): "GLUCAP" in the last 168 hours. Lipid Profile: Recent Labs    01/21/22 0407  CHOL 88  HDL 32*  LDLCALC 44  TRIG 58  CHOLHDL 2.8   Thyroid Function Tests: Recent Labs    01/21/22 0407  TSH 0.945   Anemia Panel: No results for input(s): "VITAMINB12", "FOLATE", "FERRITIN", "TIBC", "IRON", "RETICCTPCT" in the last 72 hours. Sepsis Labs: Recent Labs  Lab 01/18/22 2126 01/19/22 0602 01/20/22 0033 01/21/22 0823  PROCALCITON 0.21 0.41 0.33  --   LATICACIDVEN 4.0* 1.1  --  1.0    Recent Results (from the past 240 hour(s))  Resp Panel by RT-PCR (Flu A&B, Covid) Anterior Nasal Swab     Status: None    Collection Time: 01/18/22  6:06 AM   Specimen: Anterior Nasal Swab  Result Value Ref Range Status   SARS Coronavirus 2 by RT PCR NEGATIVE NEGATIVE Final    Comment: (NOTE) SARS-CoV-2 target nucleic acids are NOT DETECTED.  The SARS-CoV-2 RNA is generally detectable in upper respiratory specimens during the acute phase of infection. The lowest concentration of SARS-CoV-2 viral copies this assay can detect is 138 copies/mL. A negative result does not preclude SARS-Cov-2 infection and should not be used as the sole basis for treatment or other patient management decisions. A negative result may occur with  improper specimen collection/handling, submission of specimen other than nasopharyngeal swab, presence of viral mutation(s) within the areas targeted by this assay, and inadequate number of viral copies(<138 copies/mL). A negative result must be combined with clinical observations, patient history, and epidemiological information. The expected result is Negative.  Fact Sheet for Patients:  EntrepreneurPulse.com.au  Fact Sheet for Healthcare Providers:  IncredibleEmployment.be  This test is no t yet approved or cleared by the [place] FDA and  has been authorized for detection and/or diagnosis of SARS-CoV-2 by FDA under an Emergency Use Authorization (EUA). This EUA will remain  in effect (meaning this test can be used) for the duration of the COVID-19 declaration under Section 564(b)(1) of the Act, 21 U.S.C.section 360bbb-3(b)(1), unless the authorization is terminated  or revoked sooner.       Influenza A by PCR NEGATIVE NEGATIVE Final   Influenza B by PCR NEGATIVE NEGATIVE Final    Comment: (NOTE) The Xpert Xpress SARS-CoV-2/FLU/RSV plus assay is intended as an aid in the diagnosis of influenza from Nasopharyngeal swab specimens and should not be used as a sole basis for treatment. Nasal washings and aspirates are unacceptable for  Xpert Xpress SARS-CoV-2/FLU/RSV testing.  Fact Sheet for Patients: EntrepreneurPulse.com.au  Fact Sheet for Healthcare Providers: IncredibleEmployment.be  This test is not yet approved or cleared by the [place] FDA and has been authorized for detection and/or diagnosis  of SARS-CoV-2 by FDA under an Emergency Use Authorization (EUA). This EUA will remain in effect (meaning this test can be used) for the duration of the COVID-19 declaration under Section 564(b)(1) of the Act, 21 U.S.C. section 360bbb-3(b)(1), unless the authorization is terminated or revoked.  Performed at [facility], [place] [address] [place], [place] [address]   MRSA Next Gen by PCR, Nasal     Status: None   Collection Time: 01/18/22  8:01 PM   Specimen: Nasal Mucosa; Nasal Swab  Result Value Ref Range Status   MRSA by PCR Next Gen NOT DETECTED NOT DETECTED Final    Comment: (NOTE) The GeneXpert MRSA Assay (FDA approved for NASAL specimens only), is one component of a comprehensive MRSA colonization surveillance program. It is not intended to diagnose MRSA infection nor to guide or monitor treatment for MRSA infections. Test performance is not FDA approved in patients less than 67 years old. Performed at [facility], [place] [address] [place], [place] [address]   Respiratory (~20 pathogens) panel by PCR     Status: None   Collection Time: 01/18/22 10:08 PM   Specimen: Nasopharyngeal Swab; Respiratory  Result Value Ref Range Status   Adenovirus NOT DETECTED NOT DETECTED Final   Coronavirus 229E NOT DETECTED NOT DETECTED Final    Comment: (NOTE) The Coronavirus on the Respiratory Panel, DOES NOT test for the novel  Coronavirus (2019 nCoV)    Coronavirus HKU1 NOT DETECTED NOT DETECTED Final   Coronavirus NL63 NOT DETECTED NOT DETECTED Final   Coronavirus OC43 NOT DETECTED NOT DETECTED Final   Metapneumovirus NOT DETECTED NOT DETECTED Final   Rhinovirus /  Enterovirus NOT DETECTED NOT DETECTED Final   Influenza A NOT DETECTED NOT DETECTED Final   Influenza B NOT DETECTED NOT DETECTED Final   Parainfluenza Virus 1 NOT DETECTED NOT DETECTED Final   Parainfluenza Virus 2 NOT DETECTED NOT DETECTED Final   Parainfluenza Virus 3 NOT DETECTED NOT DETECTED Final   Parainfluenza Virus 4 NOT DETECTED NOT DETECTED Final   Respiratory Syncytial Virus NOT DETECTED NOT DETECTED Final   Bordetella pertussis NOT DETECTED NOT DETECTED Final   Bordetella Parapertussis NOT DETECTED NOT DETECTED Final   Chlamydophila pneumoniae NOT DETECTED NOT DETECTED Final   Mycoplasma pneumoniae NOT DETECTED NOT DETECTED Final    Comment: Performed at [facility], [place]. [address], [place], [place] [address]     Radiology Studies: No results found.  Scheduled Meds:  sodium chloride   Intravenous Once   atorvastatin  40 mg Oral Daily   budesonide (PULMICORT) nebulizer solution  0.5 mg Nebulization BID   Chlorhexidine Gluconate Cloth  6 each Topical Daily   empagliflozin  10 mg Oral Daily   ipratropium-albuterol  3 mL Nebulization BID   nicotine  14 mg Transdermal Daily   pantoprazole  40 mg Intravenous Q12H   sodium chloride flush  3 mL Intravenous Q12H   Continuous Infusions:  sodium chloride     ferric gluconate (FERRLECIT) IVPB 250 mg (01/20/22 1144)     LOS: 3 days   Darliss Cheney, MD [facility]  01/21/2022, 10:17 AM   *Please note that this is a verbal dictation therefore any spelling or grammatical errors are due to the "[place] [employer]" system interpretation.  Please page via [place] and do not message via secure chat for urgent patient care matters. Secure chat can be used for non urgent patient care matters.  How to contact the [facility]  Attending or Consulting provider [place] or covering provider during after hours [place], for this patient?  Check the care team in [facility] and look for a) attending/consulting [facility] provider listed and b) the [facility]  team listed. Page or secure chat [phone]-[phone]. Log into [email or web address] and use 's universal password to access. If you do not have the password, please contact the hospital operator. Locate the [facility] provider you are looking for under [facility] and page to a number that you can be directly reached. If you still have difficulty reaching the provider, please page the [facility] (Director on Call) for the Hospitalists listed on amion for assistance.

## 2022-01-21 NOTE — Progress Notes (Signed)
Heart Failure Navigator Progress Note  Assessed for Heart & Vascular TOC clinic readiness.  Patient EF 35-40%, poor historian with cognitive impairments. .   Navigator will sign off at this time.    Earnestine Leys, BSN, Clinical cytogeneticist Only

## 2022-01-21 NOTE — Progress Notes (Signed)
NAME:  Greg [name], MRN:  [id number], DOB:  Jan 07, 1951, LOS: 3 ADMISSION DATE:  01/18/2022, CONSULTATION DATE:  01/18/2022 REFERRING MD:  Dr. Lorin Mercy, CHIEF COMPLAINT: Shortness of breath    History of Present Illness:  71 year old male with recent admission 11/7-11/13 with upper GI bleed. Underwent EGD which showed a non-bleeding ulcer. Discharged on PPI. Stay complicated with concern for NSTEMI, ECHO with EF 35-40% and grade 2 DD.   Presents to ED 12/8 with reported shortness of breath. States that fatigue and shortness of breath progressed and became worse yesterday where he couldn't even get up to go to bathroom. On arrival to ED patient with tachypnea, tachycardia, and accessory muscle use with diminished breath sounds. CXR with no acute. Found to have hemoglobin of 4.4 (10.4 on discharge 3 weeks prior). Hemoccult positive. Reports dark stools for a couple weeks, denies abdominal pain. GI consulted with plans for EGD.   12/8 afternoon patient completed 2nd unit of RBC when he was complaining of increased shortness of breath. Given 20 mg lasix. Increased supplemental oxygen to 4L. Progressive distress with accessory muscle use. Given nebs and placed on BiPAP.  Critical care consulted.   Pertinent  Medical History  AAA, HTN, Gastric Ulcer, Barrett's esophagus   Significant Hospital Events: Including procedures, antibiotic start and stop dates in addition to other pertinent events   12/8  Admit with SOB, Hgb 4.4 12/9 EGD shows 2 areas of bleeding AVM which were cauterized by laser. 12/11 Called for hypotension, on diuretics, ARB. LR 500 ml bolus ordered.   Interim History / Subjective:  Patient reports feeling better overall.  Denies bleeding Afebrile BP stable  Objective   Blood pressure 93/62, pulse 81, temperature 98.2 F (36.8 C), temperature source Oral, resp. rate 16, height '5\' 8"'$  (1.727 m), weight 61.2 kg, SpO2 96 %.        Intake/Output Summary (Last 24 hours) at 01/21/2022  0904 Last data filed at 01/21/2022 0410 Gross per 24 hour  Intake 624.01 ml  Output 3425 ml  Net -2800.99 ml   Filed Weights   01/18/22 0554  Weight: 61.2 kg    Examination: General: Thin adult male sitting up in bed in no acute distress HEENT: MM pink/moist, poor dentition, anicteric, pupils equal and reactive Neuro: AAOx4, speech clear, MAEE CV: s1s2 RRR, no m/r/g PULM: Nonlabored at rest, lungs bilaterally with good air entry anterior, no wheezing, diminished bases GI: soft, bsx4 active, tolerating p.o. Extremities: warm/dry, no edema, BLE SCDs  Skin: no rashes or lesions   Assessment & Plan:   Acute Hypoxic Respiratory Distress in setting of AECOPD +/- Pulmonary Edema RML Pulmonary Nodule CT chest 11/7 with advanced emphysema and 4 mm nodule in right middle lobe   -no longer requiring oxygen -diuresis held with soft BP overnight, oral lasix resumed per Cardiology 12/11  -pulmonary hygiene -IS, mobilize -will need outpatient pulmonary follow up at discharge for nodule   Tobacco Abuse -cessation counseling  -nicotine patch  -continue pulmicort + duoneb   Decompensated Heart Failure Hx HTN, HLD, AAA ECHO 11/8 EF 35-40% and grade 2 DD -lasix per Cardiology   -hold ARB for now  Blood Loss Anemia Acute UGIB / AVM of Duodenum Non-bleeding ulcer on EGD in November.  12/9 with single gastric and couple of duodenal AVM's.   -trend CBC  -transfuse for Hgb <7% or active bleeding   Enlarged Right Inguinal Hernia extending into scrotal sac  -pt refused surgical intervention last admission  -re-address  when more stable from bleeding / per [place] (right click and "Reselect all SmartList Selections" daily)  Per [facility]      PCCM will sign off. Please call back if new needs arise.   Noe Gens, MSN, APRN, NP-C, AGACNP-BC [facility] 01/21/2022, 10:43 AM   Please see [email or web address] for pager details.   From 7A-7P if no response, please  call [phone] After hours, please call ELink [phone]

## 2022-01-21 NOTE — Progress Notes (Signed)
Patient requested to wear BIPAP for the night

## 2022-01-21 NOTE — Significant Event (Signed)
Contacted for hypotension.  SBP's running in the 80s.  MAP greater than 65.  Most systolics are down 19-47 points from baseline during the day and last night.  Looks like it has been getting Lasix.  Admitted for GI bleed.  Stat CBC, Lasix and spironolactone stopped, ARB stopped for history of hypertension.  500 cc LR bolus ordered.  Reportedly asymptomatic.

## 2022-01-21 NOTE — Progress Notes (Signed)
DAILY PROGRESS NOTE   Patient Name: Greg [name] Date of Encounter: 01/21/2022 Cardiologist: Buford Dresser, MD  Chief Complaint   Hypotension overnight  Patient Profile   Greg [name] is a 71 y.o. male with a hx of AAA, hypertension, cognitive impairment, and iron deficiency anemia, who is being seen 01/20/2022 for the evaluation of CHF at the request of Dr Lynetta Mare.   Subjective   Noted to be hypotensive overnight- CCM notified. Lasix, aldactone and ARB stopped. Given 500 cc bolus. Was 2.8L negative yesterday - 6.4L Negative overall.  N{ improved, but remains soft this am. Hemoglobin at 9.1, HCT 27. BNP 560 (was 1200).   Objective   Vitals:   01/21/22 0240 01/21/22 0404 01/21/22 0731 01/21/22 0818  BP: 96/71 109/72 93/62   Pulse: 83 81 81   Resp: 16 16    Temp: 98 F (36.7 C) 97.7 F (36.5 C) 98.2 F (36.8 C)   TempSrc: Axillary Oral Oral   SpO2: 98% 97% 95% 96%  Weight:      Height:        Intake/Output Summary (Last 24 hours) at 01/21/2022 1012 Last data filed at 01/21/2022 0410 Gross per 24 hour  Intake 624.01 ml  Output 3425 ml  Net -2800.99 ml   Filed Weights   01/18/22 0554  Weight: 61.2 kg    Physical Exam   General appearance: alert, no distress, and unkempt Lungs: diminished breath sounds bibasilar Heart: regular rate and rhythm Extremities: extremities normal, atraumatic, no cyanosis or edema Neurologic: Grossly normal  Inpatient Medications    Scheduled Meds:  sodium chloride   Intravenous Once   atorvastatin  40 mg Oral Daily   budesonide (PULMICORT) nebulizer solution  0.5 mg Nebulization BID   Chlorhexidine Gluconate Cloth  6 each Topical Daily   empagliflozin  10 mg Oral Daily   ipratropium-albuterol  3 mL Nebulization BID   nicotine  14 mg Transdermal Daily   pantoprazole  40 mg Intravenous Q12H   sodium chloride flush  3 mL Intravenous Q12H    Continuous Infusions:  sodium chloride     ferric gluconate  (FERRLECIT) IVPB 250 mg (01/20/22 1144)    PRN Meds: acetaminophen **OR** acetaminophen, albuterol, hydrALAZINE, ondansetron **OR** ondansetron (ZOFRAN) IV   Labs   Results for orders placed or performed during the hospital encounter of 01/18/22 (from the past 48 hour(s))  CBC with Differential/Platelet     Status: Abnormal   Collection Time: 01/19/22 12:45 PM  Result Value Ref Range   WBC 6.6 4.0 - 10.5 K/uL   RBC 2.78 (L) 4.22 - 5.81 MIL/uL   Hemoglobin 6.9 (LL) 13.0 - 17.0 g/dL    Comment: REPEATED TO VERIFY THIS CRITICAL RESULT HAS VERIFIED AND BEEN CALLED TO J SELICIANO RN BY MELISSA BOOSTEDT ON 12 09 2023 AT 1323, AND HAS BEEN READ BACK.     HCT 22.4 (L) 39.0 - 52.0 %   MCV 80.6 80.0 - 100.0 fL   MCH 24.8 (L) 26.0 - 34.0 pg   MCHC 30.8 30.0 - 36.0 g/dL   RDW 18.8 (H) 11.5 - 15.5 %   Platelets 68 (L) 150 - 400 K/uL    Comment: Immature Platelet Fraction may be clinically indicated, consider ordering this additional test [id number] REPEATED TO VERIFY    nRBC 0.6 (H) 0.0 - 0.2 %   Neutrophils Relative % 70 %   Neutro Abs 4.7 1.7 - 7.7 K/uL   Lymphocytes Relative 13 %   Lymphs Abs  0.8 0.7 - 4.0 K/uL   Monocytes Relative 16 %   Monocytes Absolute 1.1 (H) 0.1 - 1.0 K/uL   Eosinophils Relative 0 %   Eosinophils Absolute 0.0 0.0 - 0.5 K/uL   Basophils Relative 0 %   Basophils Absolute 0.0 0.0 - 0.1 K/uL   Immature Granulocytes 1 %   Abs Immature Granulocytes 0.04 0.00 - 0.07 K/uL    Comment: Performed at [facility] [address] [place], [place] [address]  Prepare RBC (crossmatch)     Status: None   Collection Time: 01/19/22  3:55 PM  Result Value Ref Range   Order Confirmation      BB SAMPLE OR UNITS ALREADY AVAILABLE Performed at [facility], [place] [address], [place], [place] [address]   Magnesium     Status: None   Collection Time: 01/20/22 12:33 AM  Result Value Ref Range   Magnesium 2.2 1.7 - 2.4 mg/dL    Comment: Performed at [facility] [address] [place], [place] [address]  Phosphorus     Status: None   Collection Time: 01/20/22 12:33 AM  Result Value Ref Range   Phosphorus 3.4 2.5 - 4.6 mg/dL    Comment: Performed at [facility], [place] [address] [place], [place] [address]  Procalcitonin     Status: None   Collection Time: 01/20/22 12:33 AM  Result Value Ref Range   Procalcitonin 0.33 ng/mL    Comment:        Interpretation: PCT (Procalcitonin) <= 0.5 ng/mL: Systemic infection (sepsis) is not likely. Local bacterial infection is possible. (NOTE)       Sepsis PCT Algorithm           Lower Respiratory Tract                                      Infection PCT Algorithm    ----------------------------     ----------------------------         PCT < 0.25 ng/mL                PCT < 0.10 ng/mL          Strongly encourage             Strongly discourage   discontinuation of antibiotics    initiation of antibiotics    ----------------------------     -----------------------------       PCT 0.25 - 0.50 ng/mL            PCT 0.10 - 0.25 ng/mL               OR       >80% decrease in PCT            Discourage initiation of                                            antibiotics      Encourage discontinuation           of antibiotics    ----------------------------     -----------------------------         PCT >= 0.50 ng/mL              PCT 0.26 - 0.50 ng/mL  AND        <80% decrease in PCT             Encourage initiation of                                             antibiotics       Encourage continuation           of antibiotics    ----------------------------     -----------------------------        PCT >= 0.50 ng/mL                  PCT > 0.50 ng/mL               AND         increase in PCT                  Strongly encourage                                      initiation of antibiotics    Strongly encourage escalation           of antibiotics                                      -----------------------------                                           PCT <= 0.25 ng/mL                                                 OR                                        > 80% decrease in PCT                                      Discontinue / Do not initiate                                             antibiotics  Performed at [facility], [place] [address], [place], [place] [address]   Hemoglobin and hematocrit, blood     Status: Abnormal   Collection Time: 01/20/22 12:33 AM  Result Value Ref Range   Hemoglobin 8.3 (L) 13.0 - 17.0 g/dL   HCT 26.2 (L) 39.0 - 52.0 %    Comment: Performed at [facility], [place] [address] [place], [place] [address]  Basic metabolic panel     Status: Abnormal   Collection Time: 01/20/22  5:14 PM  Result Value Ref Range   Sodium 134 (  L) 135 - 145 mmol/L   Potassium 4.2 3.5 - 5.1 mmol/L   Chloride 97 (L) 98 - 111 mmol/L   CO2 26 22 - 32 mmol/L   Glucose, Bld 103 (H) 70 - 99 mg/dL    Comment: Glucose reference range applies only to samples taken after fasting for at least 8 hours.   BUN 14 8 - 23 mg/dL   Creatinine, Ser 1.13 0.61 - 1.24 mg/dL   Calcium 8.6 (L) 8.9 - 10.3 mg/dL   GFR, Estimated >60 >60 mL/min    Comment: (NOTE) Calculated using the CKD-EPI Creatinine Equation (2021)    Anion gap 11 5 - 15    Comment: Performed at [facility] [address] [place], [place] [address]  CBC with Differential/Platelet     Status: Abnormal   Collection Time: 01/21/22  4:07 AM  Result Value Ref Range   WBC 6.9 4.0 - 10.5 K/uL   RBC 3.39 (L) 4.22 - 5.81 MIL/uL   Hemoglobin 9.1 (L) 13.0 - 17.0 g/dL   HCT 27.7 (L) 39.0 - 52.0 %   MCV 81.7 80.0 - 100.0 fL   MCH 26.8 26.0 - 34.0 pg   MCHC 32.9 30.0 - 36.0 g/dL   RDW 18.2 (H) 11.5 - 15.5 %   Platelets 60 (L) 150 - 400 K/uL    Comment: Immature Platelet Fraction may be clinically indicated, consider ordering this additional test [id number] REPEATED TO VERIFY PLATELET COUNT  CONFIRMED BY SMEAR    nRBC 0.0 0.0 - 0.2 %   Neutrophils Relative % 64 %   Neutro Abs 4.5 1.7 - 7.7 K/uL   Lymphocytes Relative 19 %   Lymphs Abs 1.3 0.7 - 4.0 K/uL   Monocytes Relative 12 %   Monocytes Absolute 0.8 0.1 - 1.0 K/uL   Eosinophils Relative 3 %   Eosinophils Absolute 0.2 0.0 - 0.5 K/uL   Basophils Relative 1 %   Basophils Absolute 0.0 0.0 - 0.1 K/uL   WBC Morphology MORPHOLOGY UNREMARKABLE    RBC Morphology MORPHOLOGY UNREMARKABLE    Smear Review See Note    Immature Granulocytes 1 %   Abs Immature Granulocytes 0.04 0.00 - 0.07 K/uL    Comment: Performed at [facility], [place] [address] [place], [place] [address]  Basic metabolic panel     Status: Abnormal   Collection Time: 01/21/22  4:07 AM  Result Value Ref Range   Sodium 134 (L) 135 - 145 mmol/L   Potassium 3.9 3.5 - 5.1 mmol/L   Chloride 98 98 - 111 mmol/L   CO2 25 22 - 32 mmol/L   Glucose, Bld 101 (H) 70 - 99 mg/dL    Comment: Glucose reference range applies only to samples taken after fasting for at least 8 hours.   BUN 12 8 - 23 mg/dL   Creatinine, Ser 1.02 0.61 - 1.24 mg/dL   Calcium 8.6 (L) 8.9 - 10.3 mg/dL   GFR, Estimated >60 >60 mL/min    Comment: (NOTE) Calculated using the CKD-EPI Creatinine Equation (2021)    Anion gap 11 5 - 15    Comment: Performed at [facility] [address] [place],  [address]  Brain natriuretic peptide     Status: Abnormal   Collection Time: 01/21/22  4:07 AM  Result Value Ref Range   B Natriuretic Peptide 560.0 (H) 0.0 - 100.0 pg/mL    Comment: Performed at [facility] [place],  [place] [address]  Lipid panel     Status: Abnormal   Collection Time: 01/21/22  4:07 AM  Result Value Ref Range   Cholesterol 88 0 - 200 mg/dL   Triglycerides 58 <150 mg/dL   HDL 32 (L) >40 mg/dL   Total CHOL/HDL Ratio 2.8 RATIO   VLDL 12 0 - 40 mg/dL   LDL Cholesterol 44 0 - 99 mg/dL    Comment:        Total Cholesterol/HDL:CHD  Risk Coronary Heart Disease Risk Table                     Men   Women  1/2 Average Risk   3.4   3.3  Average Risk       5.0   4.4  2 X Average Risk   9.6   7.1  3 X Average Risk  23.4   11.0        Use the calculated Patient Ratio above and the CHD Risk Table to determine the patient's CHD Risk.        ATP III CLASSIFICATION (LDL):  <100     mg/dL   Optimal  100-129  mg/dL   Near or Above                    Optimal  130-159  mg/dL   Borderline  160-189  mg/dL   High  >190     mg/dL   Very High Performed at [facility] [address], [place], [place] [address]   TSH     Status: None   Collection Time: 01/21/22  4:07 AM  Result Value Ref Range   TSH 0.945 0.350 - 4.500 uIU/mL    Comment: Performed by a 3rd Generation assay with a functional sensitivity of <=0.01 uIU/mL. Performed at [facility], [place] [address] [place], [place] [address]   Lactic acid, plasma     Status: None   Collection Time: 01/21/22  8:23 AM  Result Value Ref Range   Lactic Acid, Venous 1.0 0.5 - 1.9 mmol/L    Comment: Performed at [facility] [address], [place],  [address]    ECG   Sinus tachycardia at 105 (12/8) - Personally Reviewed  Telemetry   N/A - Personally Reviewed  Radiology    No results found.  Cardiac Studies   N/A  Assessment   Principal Problem:   Acute upper GI bleeding Active Problems:   AAA (abdominal aortic aneurysm) (HCC)   Lung nodule   Symptomatic anemia   Essential hypertension   ABLA (acute blood loss anemia)   Tobacco dependence   Dyslipidemia   Respiratory distress   AVM (arteriovenous malformation) of duodenum, acquired   Plan   Noted to be hypotensive overnight- diuretics, ARB stopped. BP somewhat improved today. Net negative, but remains volume overloaded. Was given 500 cc volume overnight, despite LVEF 35%. Will switch to oral lasix 40 mg daily today.  Time Spent Directly with Patient:  I have spent a total of  25 minutes with the patient reviewing hospital notes, telemetry, EKGs, labs and examining the patient as well as establishing an assessment and plan that was discussed personally with the patient.  > 50% of time was spent in direct patient care.  Length of Stay:  LOS: 3 days   Pixie Casino, MD, [facility], [place] Director of the Advanced Lipid  Disorders &  Cardiovascular Risk Reduction Clinic Diplomate of the American Board of Clinical Lipidology Attending Cardiologist  Direct Dial: [phone]  Fax: [phone]  Website:  [email or web address] Sheehan 01/21/2022, 10:12 AM

## 2022-01-21 NOTE — H&P (View-Only) (Signed)
          Daily Rounding Note  01/21/2022, 2:12 PM  LOS: 3 days   SUBJECTIVE:   Chief complaint:    FOBT+ dark stools.  Blood loss anemia.    Pt has no complaints.  BM yesterday, none yet today.  No n/v.  No abd pain.    OBJECTIVE:         Vital signs in last 24 hours:    Temp:  [97.7 F (36.5 C)-98.5 F (36.9 C)] 98.2 F (36.8 C) (12/11 0731) Pulse Rate:  [81-95] 81 (12/11 0731) Resp:  [16-19] 16 (12/11 0404) BP: (79-121)/(51-87) 93/62 (12/11 0731) SpO2:  [95 %-98 %] 96 % (12/11 0818) Last BM Date : 01/20/22 Filed Weights   01/18/22 0554  Weight: 61.2 kg   General: Slightly frail, pleasant, comfortable.  Does not look acutely ill. Heart: RRR. Chest: Slightly diminished breath sounds but clear bilaterally.  No labored breathing or cough Abdomen: Nontender, nondistended.  Active bowel sounds.  Extremities: No CCE. Neuro/Psych: Pleasant, fully alert and oriented.  Laconic but speech is clear, fluid.  Intake/Output from previous day: 12/10 0701 - 12/11 0700 In: 624 [P.O.:120; IV Piggyback:504] Out: 7412 [Urine:3425]  Intake/Output this shift: Total I/O In: -  Out: 1400 [Urine:1400]  Lab Results: Recent Labs    01/19/22 0602 01/19/22 1245 01/20/22 0033 01/21/22 0407  WBC 4.4 6.6  --  6.9  HGB 7.2* 6.9* 8.3* 9.1*  HCT 23.0* 22.4* 26.2* 27.7*  PLT 60* 68*  --  60*   BMET Recent Labs    01/19/22 0602 01/20/22 1714 01/21/22 0407  NA 139 134* 134*  K 3.7 4.2 3.9  CL 103 97* 98  CO2 '25 26 25  '$ GLUCOSE 127* 103* 101*  BUN '13 14 12  '$ CREATININE 1.06 1.13 1.02  CALCIUM 8.7* 8.6* 8.6*   LFT Recent Labs    01/18/22 2126  PROT 7.9  ALBUMIN 3.9  AST 37  ALT 28  ALKPHOS 52  BILITOT 0.8  BILIDIR 0.2  IBILI 0.6   PT/INR Recent Labs    01/18/22 2126  LABPROT 16.2*  INR 1.3*   Hepatitis Panel No results for input(s): "HEPBSAG", "HCVAB", "HEPAIGM", "HEPBIGM" in the last 72  hours.  Studies/Results: No results found.  ASSESMENT:     IDA.  FOBT + dark stools.   11/ 10/2021 EGD: Short segment Barrett's.  Nonbleeding gastric ulcer.  Duodenitis. 01/19/22 EGD: APC ablation of nonbleeding, solitary gastric and a couple of duodenal AVMs.  Suspect additional AVMs deeper into the small bowel and possibly in the colon. 3 PRBC thus far 12/8-12/9.  Hgb 4.4.. 9.1.   Ferrlecit x 4 doses stated 12/9.   Protonix 40 mg IV bid in place.    Elevated troponins, non-STEMI vs demand ischemia in setting of profound anemia.  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  Large, nonobstructive right inguinal hernia containing small and large bowel.  Patient has previously declined surgical intervention.  Acute hypoxic respiratory failure in setting of acute pulmonary edema and COPD flare.  Imaging shows advanced emphysema and 4 mm RLL lung nodule.   PLAN     Colonoscopy and VCE tomorrow.  Pt agreeable.  See prep orders.      Azucena Freed  01/21/2022, 2:12 PM Phone [phone]

## 2022-01-21 NOTE — Care Management Important Message (Signed)
Important Message  Patient Details  Name: Greg [name] MRN: [id number] Date of Birth: [birth year]   Medicare Important Message Given:  Yes     Hannah Beat 01/21/2022, 12:00 PM

## 2022-01-21 NOTE — Progress Notes (Signed)
Contacted MD about SBP running in the 70-80's. 500 bolus LR ordered by MD

## 2022-01-21 NOTE — Plan of Care (Signed)
  Problem: Education: Goal: Knowledge of General Education information will improve Description: Including pain rating scale, medication(s)/side effects and non-pharmacologic comfort measures Outcome: Progressing   Problem: Nutrition: Goal: Adequate nutrition will be maintained Outcome: Progressing   

## 2022-01-21 NOTE — Telephone Encounter (Signed)
Please schedule hospital follow up visit with Dr. Lamonte Sakai, Dr. Valeta Harms, or Dr. Verlee Monte for lung nodule and emphysema.  Can be set up in 4 to 6 weeks.

## 2022-01-21 NOTE — Progress Notes (Signed)
          Daily Rounding Note  01/21/2022, 2:12 PM  LOS: 3 days   SUBJECTIVE:   Chief complaint:    FOBT+ dark stools.  Blood loss anemia.    Pt has no complaints.  BM yesterday, none yet today.  No n/v.  No abd pain.    OBJECTIVE:         Vital signs in last 24 hours:    Temp:  [97.7 F (36.5 C)-98.5 F (36.9 C)] 98.2 F (36.8 C) (12/11 0731) Pulse Rate:  [81-95] 81 (12/11 0731) Resp:  [16-19] 16 (12/11 0404) BP: (79-121)/(51-87) 93/62 (12/11 0731) SpO2:  [95 %-98 %] 96 % (12/11 0818) Last BM Date : 01/20/22 Filed Weights   01/18/22 0554  Weight: 61.2 kg   General: Slightly frail, pleasant, comfortable.  Does not look acutely ill. Heart: RRR. Chest: Slightly diminished breath sounds but clear bilaterally.  No labored breathing or cough Abdomen: Nontender, nondistended.  Active bowel sounds.  Extremities: No CCE. Neuro/Psych: Pleasant, fully alert and oriented.  Laconic but speech is clear, fluid.  Intake/Output from previous day: 12/10 0701 - 12/11 0700 In: 624 [P.O.:120; IV Piggyback:504] Out: 6333 [Urine:3425]  Intake/Output this shift: Total I/O In: -  Out: 1400 [Urine:1400]  Lab Results: Recent Labs    01/19/22 0602 01/19/22 1245 01/20/22 0033 01/21/22 0407  WBC 4.4 6.6  --  6.9  HGB 7.2* 6.9* 8.3* 9.1*  HCT 23.0* 22.4* 26.2* 27.7*  PLT 60* 68*  --  60*   BMET Recent Labs    01/19/22 0602 01/20/22 1714 01/21/22 0407  NA 139 134* 134*  K 3.7 4.2 3.9  CL 103 97* 98  CO2 '25 26 25  '$ GLUCOSE 127* 103* 101*  BUN '13 14 12  '$ CREATININE 1.06 1.13 1.02  CALCIUM 8.7* 8.6* 8.6*   LFT Recent Labs    01/18/22 2126  PROT 7.9  ALBUMIN 3.9  AST 37  ALT 28  ALKPHOS 52  BILITOT 0.8  BILIDIR 0.2  IBILI 0.6   PT/INR Recent Labs    01/18/22 2126  LABPROT 16.2*  INR 1.3*   Hepatitis Panel No results for input(s): "HEPBSAG", "HCVAB", "HEPAIGM", "HEPBIGM" in the last 72  hours.  Studies/Results: No results found.  ASSESMENT:     IDA.  FOBT + dark stools.   11/ 10/2021 EGD: Short segment Barrett's.  Nonbleeding gastric ulcer.  Duodenitis. 01/19/22 EGD: APC ablation of nonbleeding, solitary gastric and a couple of duodenal AVMs.  Suspect additional AVMs deeper into the small bowel and possibly in the colon. 3 PRBC thus far 12/8-12/9.  Hgb 4.4.. 9.1.   Ferrlecit x 4 doses stated 12/9.   Protonix 40 mg IV bid in place.    Elevated troponins, non-STEMI vs demand ischemia in setting of profound anemia.  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  Large, nonobstructive right inguinal hernia containing small and large bowel.  Patient has previously declined surgical intervention.  Acute hypoxic respiratory failure in setting of acute pulmonary edema and COPD flare.  Imaging shows advanced emphysema and 4 mm RLL lung nodule.   PLAN     Colonoscopy and VCE tomorrow.  Pt agreeable.  See prep orders.      Azucena Freed  01/21/2022, 2:12 PM Phone [phone]

## 2022-01-22 ENCOUNTER — Encounter (HOSPITAL_COMMUNITY)
Admission: EM | Disposition: A | Discharge status: Home / Self Care | Payer: Self-pay | Source: Home / Self Care | Attending: Internal Medicine

## 2022-01-22 ENCOUNTER — Inpatient Hospital Stay (HOSPITAL_COMMUNITY): Payer: Medicare Other | Admitting: Certified Registered Nurse Anesthetist

## 2022-01-22 ENCOUNTER — Ambulatory Visit (HOSPITAL_BASED_OUTPATIENT_CLINIC_OR_DEPARTMENT_OTHER): Payer: Medicare Other | Admitting: Family

## 2022-01-22 ENCOUNTER — Encounter (HOSPITAL_COMMUNITY): Payer: Self-pay | Admitting: Internal Medicine

## 2022-01-22 ENCOUNTER — Other Ambulatory Visit (HOSPITAL_COMMUNITY): Payer: Self-pay

## 2022-01-22 DIAGNOSIS — K648 Other hemorrhoids: Secondary | ICD-10-CM | POA: Diagnosis not present

## 2022-01-22 DIAGNOSIS — K922 Gastrointestinal hemorrhage, unspecified: Secondary | ICD-10-CM | POA: Diagnosis not present

## 2022-01-22 DIAGNOSIS — I252 Old myocardial infarction: Secondary | ICD-10-CM

## 2022-01-22 DIAGNOSIS — I10 Essential (primary) hypertension: Secondary | ICD-10-CM

## 2022-01-22 DIAGNOSIS — D509 Iron deficiency anemia, unspecified: Secondary | ICD-10-CM | POA: Diagnosis not present

## 2022-01-22 DIAGNOSIS — K635 Polyp of colon: Secondary | ICD-10-CM

## 2022-01-22 DIAGNOSIS — I5021 Acute systolic (congestive) heart failure: Secondary | ICD-10-CM | POA: Diagnosis not present

## 2022-01-22 DIAGNOSIS — D126 Benign neoplasm of colon, unspecified: Secondary | ICD-10-CM

## 2022-01-22 DIAGNOSIS — F17200 Nicotine dependence, unspecified, uncomplicated: Secondary | ICD-10-CM

## 2022-01-22 HISTORY — PX: GIVENS CAPSULE STUDY: SHX5432

## 2022-01-22 HISTORY — PX: COLONOSCOPY WITH PROPOFOL: SHX5780

## 2022-01-22 HISTORY — PX: POLYPECTOMY: SHX5525

## 2022-01-22 LAB — CBC WITH DIFFERENTIAL/PLATELET
Abs Immature Granulocytes: 0.02 10*3/uL (ref 0.00–0.07)
Basophils Absolute: 0.1 10*3/uL (ref 0.0–0.1)
Basophils Relative: 1 %
Eosinophils Absolute: 0.2 10*3/uL (ref 0.0–0.5)
Eosinophils Relative: 4 %
HCT: 29.3 % — ABNORMAL LOW (ref 39.0–52.0)
Hemoglobin: 9.1 g/dL — ABNORMAL LOW (ref 13.0–17.0)
Immature Granulocytes: 0 %
Lymphocytes Relative: 22 %
Lymphs Abs: 1.2 10*3/uL (ref 0.7–4.0)
MCH: 26.3 pg (ref 26.0–34.0)
MCHC: 31.1 g/dL (ref 30.0–36.0)
MCV: 84.7 fL (ref 80.0–100.0)
Monocytes Absolute: 0.7 10*3/uL (ref 0.1–1.0)
Monocytes Relative: 13 %
Neutro Abs: 3.2 10*3/uL (ref 1.7–7.7)
Neutrophils Relative %: 60 %
Platelets: 66 10*3/uL — ABNORMAL LOW (ref 150–400)
RBC: 3.46 MIL/uL — ABNORMAL LOW (ref 4.22–5.81)
RDW: 18.2 % — ABNORMAL HIGH (ref 11.5–15.5)
WBC: 5.3 10*3/uL (ref 4.0–10.5)
nRBC: 0 % (ref 0.0–0.2)

## 2022-01-22 LAB — BASIC METABOLIC PANEL
Anion gap: 9 (ref 5–15)
BUN: 6 mg/dL — ABNORMAL LOW (ref 8–23)
CO2: 28 mmol/L (ref 22–32)
Calcium: 8.5 mg/dL — ABNORMAL LOW (ref 8.9–10.3)
Chloride: 102 mmol/L (ref 98–111)
Creatinine, Ser: 1.06 mg/dL (ref 0.61–1.24)
GFR, Estimated: >60 mL/min (ref >60)
Glucose, Bld: 91 mg/dL (ref 70–99)
Potassium: 3.7 mmol/L (ref 3.5–5.1)
Sodium: 139 mmol/L (ref 135–145)

## 2022-01-22 LAB — LEGIONELLA PNEUMOPHILA SEROGP 1 UR AG: L. pneumophila Serogp 1 Ur Ag: NEGATIVE

## 2022-01-22 SURGERY — COLONOSCOPY WITH PROPOFOL
Anesthesia: Monitor Anesthesia Care

## 2022-01-22 MED ORDER — PROPOFOL 500 MG/50ML IV EMUL
INTRAVENOUS | Status: DC | PRN
  Administered 2022-01-22: 75 ug/kg/min via INTRAVENOUS

## 2022-01-22 MED ORDER — LOSARTAN POTASSIUM 25 MG PO TABS
12.5000 mg | ORAL_TABLET | Freq: Every day | ORAL | Status: DC
  Administered 2022-01-23 – 2022-01-24 (×2): 12.5 mg via ORAL
  Filled 2022-01-22 (×2): qty 0.5

## 2022-01-22 MED ORDER — LACTATED RINGERS IV SOLN
INTRAVENOUS | Status: AC | PRN
  Administered 2022-01-22: 20 mL/h via INTRAVENOUS

## 2022-01-22 MED ORDER — PROPOFOL 10 MG/ML IV BOLUS
INTRAVENOUS | Status: DC | PRN
  Administered 2022-01-22: 25 mg via INTRAVENOUS

## 2022-01-22 MED ORDER — LIDOCAINE 2% (20 MG/ML) 5 ML SYRINGE
INTRAMUSCULAR | Status: DC | PRN
  Administered 2022-01-22: 40 mg via INTRAVENOUS

## 2022-01-22 SURGICAL SUPPLY — 23 items
ELECT REM PT RETURN 9FT ADLT (ELECTROSURGICAL)
ELECTRODE REM PT RTRN 9FT ADLT (ELECTROSURGICAL) IMPLANT
FCP BXJMBJMB 240X2.8X (CUTTING FORCEPS)
FLOOR PAD 36X40 (MISCELLANEOUS) ×2
FORCEPS BIOP RAD 4 LRG CAP 4 (CUTTING FORCEPS) IMPLANT
FORCEPS BIOP RJ4 240 W/NDL (CUTTING FORCEPS)
FORCEPS BXJMBJMB 240X2.8X (CUTTING FORCEPS) IMPLANT
INJECTOR/SNARE I SNARE (MISCELLANEOUS) IMPLANT
LUBRICANT JELLY 4.5OZ STERILE (MISCELLANEOUS) IMPLANT
MANIFOLD NEPTUNE II (INSTRUMENTS) IMPLANT
NDL SCLEROTHERAPY 25GX240 (NEEDLE) IMPLANT
NEEDLE SCLEROTHERAPY 25GX240 (NEEDLE) IMPLANT
PAD FLOOR 36X40 (MISCELLANEOUS) ×3 IMPLANT
PROBE APC STR FIRE (PROBE) IMPLANT
PROBE INJECTION GOLD (MISCELLANEOUS)
PROBE INJECTION GOLD 7FR (MISCELLANEOUS) IMPLANT
SNARE ROTATE MED OVAL 20MM (MISCELLANEOUS) IMPLANT
SYR 50ML LL SCALE MARK (SYRINGE) IMPLANT
TOWEL COTTON PACK 4EA (MISCELLANEOUS) ×6 IMPLANT
TRAP SPECIMEN MUCOUS 40CC (MISCELLANEOUS) IMPLANT
TUBING ENDO SMARTCAP PENTAX (MISCELLANEOUS) IMPLANT
TUBING IRRIGATION ENDOGATOR (MISCELLANEOUS) ×3 IMPLANT
WATER STERILE IRR 1000ML POUR (IV SOLUTION) IMPLANT

## 2022-01-22 NOTE — Anesthesia Postprocedure Evaluation (Deleted)
Anesthesia Post Note  Patient: Greg [name]  Procedure(s) Performed: COLONOSCOPY WITH PROPOFOL POLYPECTOMY GIVENS CAPSULE STUDY     Patient location during evaluation: PACU Anesthesia Type: MAC Level of consciousness: awake Pain management: pain level controlled Vital Signs Assessment: post-procedure vital signs reviewed and stable Respiratory status: spontaneous breathing Cardiovascular status: stable Postop Assessment: no apparent nausea or vomiting Anesthetic complications: no   No notable events documented.  Last Vitals:  Vitals:   01/22/22 1145 01/22/22 1232  BP: 103/72 106/75  Pulse: 83 85  Resp: 13   Temp: 36.8 C 36.8 C  SpO2: 99% 99%    Last Pain:  Vitals:   01/22/22 1232  TempSrc: Oral  PainSc:                  Yuli Lanigan

## 2022-01-22 NOTE — Progress Notes (Signed)
DAILY PROGRESS NOTE   Patient Name: Greg [name] Date of Encounter: 01/22/2022 Cardiologist: Buford Dresser, MD  Chief Complaint   Hypotension improved  Patient Profile   Greg [name] is a 71 y.o. male with a hx of AAA, hypertension, cognitive impairment, and iron deficiency anemia, who is being seen 01/20/2022 for the evaluation of CHF at the request of Dr Lynetta Mare.   Subjective   BP improved to low normal today - off meds except jardiance and lasix. Switched to oral lasix yesterday. Was 2.1L negative yesterday. Creatinine stable.   Objective   Vitals:   01/21/22 2124 01/22/22 0408 01/22/22 0726 01/22/22 0800  BP: 93/65 100/73 101/75   Pulse: 85 72 84   Resp: '17 20 18   '$ Temp: 98.5 F (36.9 C)  98.6 F (37 C)   TempSrc: Oral  Oral   SpO2: 98% 97% 98% 98%  Weight:      Height:        Intake/Output Summary (Last 24 hours) at 01/22/2022 1001 Last data filed at 01/22/2022 2353 Gross per 24 hour  Intake --  Output 2125 ml  Net -2125 ml   Filed Weights   01/18/22 0554  Weight: 61.2 kg    Physical Exam   General appearance: alert, no distress, and unkempt Lungs: diminished breath sounds bibasilar Heart: regular rate and rhythm Extremities: extremities normal, atraumatic, no cyanosis or edema Neurologic: Grossly normal  Inpatient Medications    Scheduled Meds:  atorvastatin  40 mg Oral Daily   Chlorhexidine Gluconate Cloth  6 each Topical Daily   empagliflozin  10 mg Oral Daily   fluticasone furoate-vilanterol  1 puff Inhalation Daily   furosemide  40 mg Oral Daily   nicotine  14 mg Transdermal Daily   pantoprazole  40 mg Oral Q0600   sodium chloride flush  3 mL Intravenous Q12H   umeclidinium bromide  1 puff Inhalation Daily    Continuous Infusions:  ferric gluconate (FERRLECIT) IVPB 250 mg (01/21/22 1118)    PRN Meds: acetaminophen **OR** acetaminophen, albuterol, hydrALAZINE, ondansetron **OR** ondansetron (ZOFRAN) IV   Labs    Results for orders placed or performed during the hospital encounter of 01/18/22 (from the past 48 hour(s))  Basic metabolic panel     Status: Abnormal   Collection Time: 01/20/22  5:14 PM  Result Value Ref Range   Sodium 134 (L) 135 - 145 mmol/L   Potassium 4.2 3.5 - 5.1 mmol/L   Chloride 97 (L) 98 - 111 mmol/L   CO2 26 22 - 32 mmol/L   Glucose, Bld 103 (H) 70 - 99 mg/dL    Comment: Glucose reference range applies only to samples taken after fasting for at least 8 hours.   BUN 14 8 - 23 mg/dL   Creatinine, Ser 1.13 0.61 - 1.24 mg/dL   Calcium 8.6 (L) 8.9 - 10.3 mg/dL   GFR, Estimated >60 >60 mL/min    Comment: (NOTE) Calculated using the CKD-EPI Creatinine Equation (2021)    Anion gap 11 5 - 15    Comment: Performed at [facility] [address], [place], [place] [address]  CBC with Differential/Platelet     Status: Abnormal   Collection Time: 01/21/22  4:07 AM  Result Value Ref Range   WBC 6.9 4.0 - 10.5 K/uL   RBC 3.39 (L) 4.22 - 5.81 MIL/uL   Hemoglobin 9.1 (L) 13.0 - 17.0 g/dL   HCT 27.7 (L) 39.0 - 52.0 %   MCV 81.7  80.0 - 100.0 fL   MCH 26.8 26.0 - 34.0 pg   MCHC 32.9 30.0 - 36.0 g/dL   RDW 18.2 (H) 11.5 - 15.5 %   Platelets 60 (L) 150 - 400 K/uL    Comment: Immature Platelet Fraction may be clinically indicated, consider ordering this additional test [id number] REPEATED TO VERIFY PLATELET COUNT CONFIRMED BY SMEAR    nRBC 0.0 0.0 - 0.2 %   Neutrophils Relative % 64 %   Neutro Abs 4.5 1.7 - 7.7 K/uL   Lymphocytes Relative 19 %   Lymphs Abs 1.3 0.7 - 4.0 K/uL   Monocytes Relative 12 %   Monocytes Absolute 0.8 0.1 - 1.0 K/uL   Eosinophils Relative 3 %   Eosinophils Absolute 0.2 0.0 - 0.5 K/uL   Basophils Relative 1 %   Basophils Absolute 0.0 0.0 - 0.1 K/uL   WBC Morphology MORPHOLOGY UNREMARKABLE    RBC Morphology MORPHOLOGY UNREMARKABLE    Smear Review See Note    Immature Granulocytes 1 %   Abs Immature Granulocytes 0.04 0.00 - 0.07 K/uL     Comment: Performed at [facility], [address], [place], [place] [address]  Basic metabolic panel     Status: Abnormal   Collection Time: 01/21/22  4:07 AM  Result Value Ref Range   Sodium 134 (L) 135 - 145 mmol/L   Potassium 3.9 3.5 - 5.1 mmol/L   Chloride 98 98 - 111 mmol/L   CO2 25 22 - 32 mmol/L   Glucose, Bld 101 (H) 70 - 99 mg/dL    Comment: Glucose reference range applies only to samples taken after fasting for at least 8 hours.   BUN 12 8 - 23 mg/dL   Creatinine, Ser 1.02 0.61 - 1.24 mg/dL   Calcium 8.6 (L) 8.9 - 10.3 mg/dL   GFR, Estimated >60 >60 mL/min    Comment: (NOTE) Calculated using the CKD-EPI Creatinine Equation (2021)    Anion gap 11 5 - 15    Comment: Performed at [facility] [address], [place], [place] [address]  Brain natriuretic peptide     Status: Abnormal   Collection Time: 01/21/22  4:07 AM  Result Value Ref Range   B Natriuretic Peptide 560.0 (H) 0.0 - 100.0 pg/mL    Comment: Performed at [facility] [address], [place], [place] [address]  Lipid panel     Status: Abnormal   Collection Time: 01/21/22  4:07 AM  Result Value Ref Range   Cholesterol 88 0 - 200 mg/dL   Triglycerides 58 <150 mg/dL   HDL 32 (L) >40 mg/dL   Total CHOL/HDL Ratio 2.8 RATIO   VLDL 12 0 - 40 mg/dL   LDL Cholesterol 44 0 - 99 mg/dL    Comment:        Total Cholesterol/HDL:CHD Risk Coronary Heart Disease Risk Table                     Men   Women  1/2 Average Risk   3.4   3.3  Average Risk       5.0   4.4  2 X Average Risk   9.6   7.1  3 X Average Risk  23.4   11.0        Use the calculated Patient Ratio above and the CHD Risk Table to determine the patient's CHD Risk.        ATP III CLASSIFICATION (LDL):  <  100     mg/dL   Optimal  100-129  mg/dL   Near or Above                    Optimal  130-159  mg/dL   Borderline  160-189  mg/dL   High  >190     mg/dL   Very High Performed at [facility] [address] [place],  [place] [address]   TSH     Status: None   Collection Time: 01/21/22  4:07 AM  Result Value Ref Range   TSH 0.945 0.350 - 4.500 uIU/mL    Comment: Performed by a 3rd Generation assay with a functional sensitivity of <=0.01 uIU/mL. Performed at [facility], [place] [address], [place], [place] [address]   Hemoglobin A1c     Status: None   Collection Time: 01/21/22  4:07 AM  Result Value Ref Range   Hgb A1c MFr Bld 5.4 4.8 - 5.6 %    Comment: (NOTE)         Prediabetes: 5.7 - 6.4         Diabetes: >6.4         Glycemic control for adults with diabetes: <7.0    Mean Plasma Glucose 108 mg/dL    Comment: (NOTE) Performed At: [facility] [address] [place], [place] [id number] Rush Farmer MD [id number]   Lactic acid, plasma     Status: None   Collection Time: 01/21/22  8:23 AM  Result Value Ref Range   Lactic Acid, Venous 1.0 0.5 - 1.9 mmol/L    Comment: Performed at [facility], [place] [address], [place], [place] [address]  CBC with Differential/Platelet     Status: Abnormal   Collection Time: 01/22/22  2:49 AM  Result Value Ref Range   WBC 5.3 4.0 - 10.5 K/uL   RBC 3.46 (L) 4.22 - 5.81 MIL/uL   Hemoglobin 9.1 (L) 13.0 - 17.0 g/dL   HCT 29.3 (L) 39.0 - 52.0 %   MCV 84.7 80.0 - 100.0 fL   MCH 26.3 26.0 - 34.0 pg   MCHC 31.1 30.0 - 36.0 g/dL   RDW 18.2 (H) 11.5 - 15.5 %   Platelets 66 (L) 150 - 400 K/uL    Comment: Immature Platelet Fraction may be clinically indicated, consider ordering this additional test [id number] REPEATED TO VERIFY    nRBC 0.0 0.0 - 0.2 %   Neutrophils Relative % 60 %   Neutro Abs 3.2 1.7 - 7.7 K/uL   Lymphocytes Relative 22 %   Lymphs Abs 1.2 0.7 - 4.0 K/uL   Monocytes Relative 13 %   Monocytes Absolute 0.7 0.1 - 1.0 K/uL   Eosinophils Relative 4 %   Eosinophils Absolute 0.2 0.0 - 0.5 K/uL   Basophils Relative 1 %   Basophils Absolute 0.1 0.0 - 0.1 K/uL   Immature Granulocytes 0 %   Abs Immature Granulocytes 0.02 0.00 - 0.07  K/uL    Comment: Performed at [facility], [place] [address], [place], [place] [address]  Basic metabolic panel     Status: Abnormal   Collection Time: 01/22/22  2:49 AM  Result Value Ref Range   Sodium 139 135 - 145 mmol/L   Potassium 3.7 3.5 - 5.1 mmol/L   Chloride 102 98 - 111 mmol/L   CO2 28 22 - 32 mmol/L   Glucose, Bld 91 70 - 99 mg/dL    Comment: Glucose  reference range applies only to samples taken after fasting for at least 8 hours.   BUN 6 (L) 8 - 23 mg/dL   Creatinine, Ser 1.06 0.61 - 1.24 mg/dL   Calcium 8.5 (L) 8.9 - 10.3 mg/dL   GFR, Estimated >60 >60 mL/min    Comment: (NOTE) Calculated using the CKD-EPI Creatinine Equation (2021)    Anion gap 9 5 - 15    Comment: Performed at [facility] [address] [place], [place] [address]    ECG   N/A  Telemetry   N/A - Personally Reviewed  Radiology    No results found.  Cardiac Studies   N/A  Assessment   Principal Problem:   Acute upper GI bleeding Active Problems:   AAA (abdominal aortic aneurysm) (HCC)   Lung nodule   Symptomatic anemia   Essential hypertension   ABLA (acute blood loss anemia)   Tobacco dependence   Dyslipidemia   Respiratory distress   AVM (arteriovenous malformation) of duodenum, acquired   Centrilobular emphysema (HCC)   Tobacco abuse   Heme positive stool   Plan   Hypotension is improved- hemoglobin is stable. Now on oral lasix and jardiance. Would restart low dose ARB as BP tolerates. No room to add BB, ARB/ARNI and other meds at this time. Should not be on ACE-I/ARB, as was listed PTI, this was likely a mistake. NPO today for colonoscopy. Since he is NPO and has had forced diarrhea overnight, would hold off on additional HF meds today.  Time Spent Directly with Patient:  I have spent a total of 25 minutes with the patient reviewing hospital notes, telemetry, EKGs, labs and examining the patient as well as establishing an assessment and plan that was  discussed personally with the patient.  > 50% of time was spent in direct patient care.  Length of Stay:  LOS: 4 days   Pixie Casino, MD, [facility], [place] Director of the [facility] Lipid [facility] &  Card[facility] Diplomate of the American Board of Clinical Lipidology Attending Cardiologist  Direct Dial: [phone]  Fax: [phone]  Website:  [email or web address] [email or web address] Evrett Hakim 01/22/2022, 10:01 AM

## 2022-01-22 NOTE — Progress Notes (Signed)
PROGRESS NOTE    Greg [name]  CBS:[id number] DOB: [birth year] DOA: 01/18/2022 PCP: Pcp, No   Brief Narrative:  71 year old male with recent admission 11/7-11/13 with upper GI bleed. Underwent EGD which showed a non-bleeding ulcer. Discharged on PPI.    Presents to ED 12/8 with reported shortness of breath. On arrival to ED patient with tachypnea, tachycardia, and accessory muscle use with diminished breath sounds. CXR with no acute. Found to have hemoglobin of 4.4 (10.4 on discharge 3 weeks prior). Hemoccult positive. Reports dark stools for a couple weeks, denies abdominal pain.  Patient admitted under hospital service and GI consulted.    12/8 afternoon patient completed 2nd unit of RBC when he was complaining of increased shortness of breath. Given 20 mg lasix. Increased supplemental oxygen to 4L. Progressive distress with accessory muscle use. Given nebs and placed on BiPAP.  Critical care consulted and patient was transferred to ICU. Significant Hospital Events: Including procedures, antibiotic start and stop dates in addition to other pertinent events   12/8 > Presents to ED  12/9 EGD shows 2 areas of bleeding AVM which were cauterized by laser. 12/8 echocardiogram shows reduced EF at 35 to 40%. 12/11.  Transferred under [employer] Assessment & Plan:   Principal Problem:   Acute upper GI bleeding Active Problems:   AAA (abdominal aortic aneurysm) (HCC)   Lung nodule   Symptomatic anemia   Essential hypertension   ABLA (acute blood loss anemia)   Tobacco dependence   Dyslipidemia   Respiratory distress   AVM (arteriovenous malformation) of duodenum, acquired   Centrilobular emphysema (HCC)   Tobacco abuse   Heme positive stool  Acute hypoxic respiratory failure in the setting of acute pulmonary edema and acute exacerbation of COPD: CT chest 11 7 with advanced emphysema and 4 mm nodule in the right middle lobe.  Patient has improved with diuresis.  He is on room air and denies any  shortness of breath.  Acute blood loss anemia/duodenal AVM: Presented with hemoglobin of 4.4, received 3 units of PRBC transfusion this admission, hemoglobin over 9 today.  Underwent EGD on 01/19/2022 and was found to have single gastric and couple of duodenal AVMs that were APC.  Plan for colonoscopy today.  If colonoscopy unremarkable, will need capsule study.  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  He is asymptomatic.  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  Likely ischemic etiology.  In the past, catheterization was deferred due to concerns for cognitive impairment as well as bleeding.  He was sent home on medical therapy.  Once again, seen by cardiology and due to the above issues, they recommend medical management.  He was started on Aldactone 12.5 mg daily, losartan 12.5 mg daily, Jardiance 10 mg.  However he had hypotension the night before so diuretic as well as losartan was stopped.  BP somewhat improved after he received some IV fluids.  He was restarted on Lasix.  Cardiology to likely start on beta-blocker and may consider Entresto when patient improved.  Elevated troponin/demand ischemia/CAD with likely underlying LAD infarct:: Cardiology on board.  No plan for intervention as mentioned above.  Hyperlipidemia: Continue atorvastatin.  History of hypertension: Was taking lisinopril at home.  Currently low blood pressure.  He is asymptomatic.  Monitor closely.  DVT prophylaxis: SCDs Start: 01/18/22  1021   Code Status: Full Code  Family Communication:  None present at bedside.  Plan of care discussed with patient in length and he/she verbalized understanding and agreed with it.  Status is: Inpatient Remains inpatient appropriate because: Scheduled for colonoscopy  today.   Estimated body mass index is 20.53 kg/m as calculated from the following:   Height as of this encounter: '5\' 8"'$  (1.727 m).   Weight as of this encounter: 61.2 kg.    Nutritional Assessment: Body mass index is 20.53 kg/m.Marland Kitchen Seen by dietician.  I agree with the assessment and plan as outlined below: Nutrition Status:        . Skin Assessment: I have examined the patient's skin and I agree with the wound assessment as performed by the wound care RN as outlined below:    Consultants:  Cardiology and GI  Procedures:  As above  Antimicrobials:  Anti-infectives (From admission, onward)    None         Subjective:  Patient seen and examined.  He has no complaints.  Objective: Vitals:   01/21/22 2124 01/22/22 0408 01/22/22 0726 01/22/22 0800  BP: 93/65 100/73 101/75   Pulse: 85 72 84   Resp: '17 20 18   '$ Temp: 98.5 F (36.9 C)  98.6 F (37 C)   TempSrc: Oral  Oral   SpO2: 98% 97% 98% 98%  Weight:      Height:        Intake/Output Summary (Last 24 hours) at 01/22/2022 0942 Last data filed at 01/22/2022 4627 Gross per 24 hour  Intake --  Output 2125 ml  Net -2125 ml    Filed Weights   01/18/22 0554  Weight: 61.2 kg    Examination:  General exam: Appears calm and comfortable  Respiratory system: Clear to auscultation. Respiratory effort normal. Cardiovascular system: S1 & S2 heard, RRR. No JVD, murmurs, rubs, gallops or clicks. No pedal edema. Gastrointestinal system: Abdomen is nondistended, soft and nontender. No organomegaly or masses felt. Normal bowel sounds heard. Central nervous system: Alert and oriented. No focal neurological deficits. Extremities: Symmetric 5 x 5 power. Skin: No rashes, lesions or ulcers.   Data Reviewed: I have personally reviewed following labs and imaging studies  CBC: Recent Labs  Lab 01/18/22 0647 01/18/22 1854 01/18/22 2126 01/19/22 0602 01/19/22 1245 01/20/22 0033 01/21/22 0407 01/22/22 0249  WBC  4.8  --  9.3 4.4 6.6  --  6.9 5.3  NEUTROABS 3.5  --   --   --  4.7  --  4.5 3.2  HGB 4.4*   < > 8.3* 7.2* 6.9* 8.3* 9.1* 9.1*  HCT 14.6*   < > 25.9* 23.0* 22.4* 26.2* 27.7* 29.3*  MCV 83.9  --  78.7* 79.0* 80.6  --  81.7 84.7  PLT 80*  --  80* 60* 68*  --  60* 66*   < > = values in this interval not displayed.    Basic Metabolic Panel: Recent Labs  Lab 01/18/22 2126 01/19/22 0602 01/20/22 0033 01/20/22 1714 01/21/22 0407 01/22/22 0249  NA 138 139  --  134* 134* 139  K 3.6 3.7  --  4.2 3.9 3.7  CL 102 103  --  97* 98 102  CO2 24 25  --  '26 25 28  '$ GLUCOSE 172* 127*  --  103* 101* 91  BUN 18 13  --  14 12 6*  CREATININE 1.21 1.06  --  1.13 1.02 1.06  CALCIUM 8.9 8.7*  --  8.6* 8.6* 8.5*  MG 1.9 2.0 2.2  --   --   --   PHOS 3.7 4.2 3.4  --   --   --     GFR: Estimated Creatinine Clearance: 55.3 mL/min (by C-G formula based on SCr of 1.06 mg/dL). Liver Function Tests: Recent Labs  Lab 01/18/22 2126  AST 37  ALT 28  ALKPHOS 52  BILITOT 0.8  PROT 7.9  ALBUMIN 3.9    No results for input(s): "LIPASE", "AMYLASE" in the last 168 hours. No results for input(s): "AMMONIA" in the last 168 hours. Coagulation Profile: Recent Labs  Lab 01/18/22 2126  INR 1.3*    Cardiac Enzymes: No results for input(s): "CKTOTAL", "CKMB", "CKMBINDEX", "TROPONINI" in the last 168 hours. BNP (last 3 results) No results for input(s): "PROBNP" in the last 8760 hours. HbA1C: Recent Labs    01/21/22 0407  HGBA1C 5.4   CBG: No results for input(s): "GLUCAP" in the last 168 hours. Lipid Profile: Recent Labs    01/21/22 0407  CHOL 88  HDL 32*  LDLCALC 44  TRIG 58  CHOLHDL 2.8    Thyroid Function Tests: Recent Labs    01/21/22 0407  TSH 0.945    Anemia Panel: No results for input(s): "VITAMINB12", "FOLATE", "FERRITIN", "TIBC", "IRON", "RETICCTPCT" in the last 72 hours. Sepsis Labs: Recent Labs  Lab 01/18/22 2126 01/19/22 0602 01/20/22 0033 01/21/22 0823  PROCALCITON  0.21 0.41 0.33  --   LATICACIDVEN 4.0* 1.1  --  1.0     Recent Results (from the past 240 hour(s))  Resp Panel by RT-PCR (Flu A&B, Covid) Anterior Nasal Swab     Status: None   Collection Time: 01/18/22  6:06 AM   Specimen: Anterior Nasal Swab  Result Value Ref Range Status   SARS Coronavirus 2 by RT PCR NEGATIVE NEGATIVE Final    Comment: (NOTE) SARS-CoV-2 target nucleic acids are NOT DETECTED.  The SARS-CoV-2 RNA is generally detectable in upper respiratory specimens during the acute phase of infection. The lowest concentration of SARS-CoV-2 viral copies this assay can detect is 138 copies/mL. A negative result does not preclude SARS-Cov-2 infection and should not be used as the sole basis for treatment or other patient management decisions. A negative result may occur with  improper specimen collection/handling, submission of specimen other than nasopharyngeal swab, presence of viral mutation(s) within the areas targeted by this assay, and inadequate number of viral copies(<138 copies/mL). A negative result must be combined with clinical observations, patient history, and epidemiological information. The expected result is Negative.  Fact Sheet for Patients:  EntrepreneurPulse.com.au  Fact Sheet for Healthcare Providers:  IncredibleEmployment.be  This test is no t yet approved or cleared by the [place] FDA and  has been authorized for detection and/or diagnosis of SARS-CoV-2 by FDA under an Emergency Use Authorization (EUA). This EUA will remain  in effect (meaning this test can be used) for the duration of the COVID-19 declaration under Section 564(b)(1) of the Act, 21 U.S.C.section 360bbb-3(b)(1), unless the authorization is terminated  or revoked sooner.       Influenza A by PCR NEGATIVE NEGATIVE Final   Influenza B by PCR NEGATIVE NEGATIVE Final    Comment: (NOTE) The Xpert Xpress SARS-CoV-2/FLU/RSV plus assay is intended  as an aid in the diagnosis of influenza from Nasopharyngeal swab specimens and should not be used as a sole basis for treatment. Nasal washings and aspirates are unacceptable for Xpert Xpress SARS-CoV-2/FLU/RSV testing.  Fact Sheet for Patients:  EntrepreneurPulse.com.au  Fact Sheet for Healthcare Providers: IncredibleEmployment.be  This test is not yet approved or cleared by the [place] FDA and has been authorized for detection and/or diagnosis of SARS-CoV-2 by FDA under an Emergency Use Authorization (EUA). This EUA will remain in effect (meaning this test can be used) for the duration of the COVID-19 declaration under Section 564(b)(1) of the Act, 21 U.S.C. section 360bbb-3(b)(1), unless the authorization is terminated or revoked.  Performed at [facility], [place] [address] [place], [place] [address]   MRSA Next Gen by PCR, Nasal     Status: None   Collection Time: 01/18/22  8:01 PM   Specimen: Nasal Mucosa; Nasal Swab  Result Value Ref Range Status   MRSA by PCR Next Gen NOT DETECTED NOT DETECTED Final    Comment: (NOTE) The GeneXpert MRSA Assay (FDA approved for NASAL specimens only), is one component of a comprehensive MRSA colonization surveillance program. It is not intended to diagnose MRSA infection nor to guide or monitor treatment for MRSA infections. Test performance is not FDA approved in patients less than 83 years old. Performed at [facility], [place] [address] [place], [place] [address]   Respiratory (~20 pathogens) panel by PCR     Status: None   Collection Time: 01/18/22 10:08 PM   Specimen: Nasopharyngeal Swab; Respiratory  Result Value Ref Range Status   Adenovirus NOT DETECTED NOT DETECTED Final   Coronavirus 229E NOT DETECTED NOT DETECTED Final    Comment: (NOTE) The Coronavirus on the Respiratory Panel, DOES NOT test for the novel  Coronavirus (2019 nCoV)    Coronavirus HKU1 NOT DETECTED NOT  DETECTED Final   Coronavirus NL63 NOT DETECTED NOT DETECTED Final   Coronavirus OC43 NOT DETECTED NOT DETECTED Final   Metapneumovirus NOT DETECTED NOT DETECTED Final   Rhinovirus / Enterovirus NOT DETECTED NOT DETECTED Final   Influenza A NOT DETECTED NOT DETECTED Final   Influenza B NOT DETECTED NOT DETECTED Final   Parainfluenza Virus 1 NOT DETECTED NOT DETECTED Final   Parainfluenza Virus 2 NOT DETECTED NOT DETECTED Final   Parainfluenza Virus 3 NOT DETECTED NOT DETECTED Final   Parainfluenza Virus 4 NOT DETECTED NOT DETECTED Final   Respiratory Syncytial Virus NOT DETECTED NOT DETECTED Final   Bordetella pertussis NOT DETECTED NOT DETECTED Final   Bordetella Parapertussis NOT DETECTED NOT DETECTED Final   Chlamydophila pneumoniae NOT DETECTED NOT DETECTED Final   Mycoplasma pneumoniae NOT DETECTED NOT DETECTED Final    Comment: Performed at [facility], [place] [address], [place], [place] [address]     Radiology Studies: No results found.  Scheduled Meds:  atorvastatin  40 mg Oral Daily   Chlorhexidine Gluconate Cloth  6 each Topical Daily   empagliflozin  10 mg Oral Daily   fluticasone furoate-vilanterol  1 puff Inhalation Daily   furosemide  40 mg Oral Daily   nicotine  14 mg Transdermal Daily   pantoprazole  40 mg Oral Q0600   sodium chloride flush  3 mL Intravenous Q12H   umeclidinium bromide  1 puff Inhalation Daily   Continuous Infusions:  ferric gluconate (FERRLECIT) IVPB 250 mg (01/21/22 1118)     LOS: 4 days   Darliss Cheney, MD [facility]  01/22/2022, 9:42 AM   *Please note that this is a verbal dictation therefore any spelling or grammatical errors are due to the "[employer]" system interpretation.  Please page via [place] and do not message via secure  chat for urgent patient care matters. Secure chat can be used for non urgent patient care matters.  How to contact the [facility] Attending or Consulting provider [place] or covering provider  during after hours [place], for this patient?  Check the care team in [facility] and look for a) attending/consulting TR[facility] provider listed and b) the [facility] team listed. Page or secure chat [phone]. Log into [email or web address] and use 's universal password to access. If you do not have the password, please contact the hospital operator. Locate the [facility] provider you are looking for under [facility] and page to a number that you can be directly reached. If you still have difficulty reaching the provider, please page the [facility] (Director on Call) for the Hospitalists listed on amion for assistance.

## 2022-01-22 NOTE — Anesthesia Postprocedure Evaluation (Signed)
Anesthesia Post Note  Patient: Herve Haug  Procedure(s) Performed: COLONOSCOPY WITH PROPOFOL POLYPECTOMY GIVENS CAPSULE STUDY     Patient location during evaluation: Endoscopy Anesthesia Type: MAC Level of consciousness: awake Pain management: pain level controlled Vital Signs Assessment: post-procedure vital signs reviewed and stable Respiratory status: spontaneous breathing Cardiovascular status: stable Postop Assessment: no apparent nausea or vomiting Anesthetic complications: no   No notable events documented.  Last Vitals:  Vitals:   01/22/22 1145 01/22/22 1232  BP: 103/72 106/75  Pulse: 83 85  Resp: 13   Temp: 36.8 C 36.8 C  SpO2: 99% 99%    Last Pain:  Vitals:   01/22/22 1232  TempSrc: Oral  PainSc:                  Chisom Muntean

## 2022-01-22 NOTE — Op Note (Signed)
[facility] Patient Name: Greg [name] Procedure Date : 01/22/2022 MRN: [id number] Attending MD: Carlota Raspberry. Havery Moros , MD, [id number] Date of Birth: December 25, 1950 CSN: [id number] Age: 71 Admit Type: Inpatient Procedure:                Colonoscopy Indications:              Dark heme positive stools, prior EGD showed AVMs                            that were ablated, Iron deficiency anemia, no prior                            colonoscopy. Large R inguinal hernia that contains                            part of the colon on CT Scan Providers:                Remo Lipps P. Havery Moros, MD, Orvil Feil, RN, Brien Mates, Technician Referring MD:              Medicines:                Monitored Anesthesia Care Complications:            No immediate complications. Estimated blood loss:                            Minimal. Estimated Blood Loss:     Estimated blood loss was minimal. Procedure:                Pre-Anesthesia Assessment:                           - Prior to the procedure, a History and Physical                            was performed, and patient medications and                            allergies were reviewed. The patient's tolerance of                            previous anesthesia was also reviewed. The risks                            and benefits of the procedure and the sedation                            options and risks were discussed with the patient.                            All questions were answered, and informed consent  was obtained. Prior Anticoagulants: The patient has                            taken no anticoagulant or antiplatelet agents. ASA                            Grade Assessment: IV - A patient with severe                            systemic disease that is a constant threat to life.                            After reviewing the risks and benefits, the patient                             was deemed in satisfactory condition to undergo the                            procedure.                           After obtaining informed consent, the colonoscope                            was passed under direct vision. Throughout the                            procedure, the patient's blood pressure, pulse, and                            oxygen saturations were monitored continuously. The                            [id number]-HQ190L ([id number] Olympus peds colonscope was                            introduced through the anus with the intention of                            advancing to the cecum. The scope was advanced to                            the ascending colon before the procedure was                            aborted. Medications were given. The colonoscopy                            was technically difficult and complex due to                            unsatisfactory bowel prep and restricted mobility  of the colon. The patient tolerated the procedure                            well. The quality of the bowel preparation was                            fair. The rectum was photographed. Scope In: 11:03:38 AM Scope Out: 11:21:55 AM Total Procedure Duration: 0 hours 18 minutes 17 seconds  Findings:      The perianal and digital rectal examinations were normal.      A large amount of semi-liquid stool and residual corn was found in the       entire colon, making visualization difficult. Lavage of the colon was       performed using copious amounts of sterile water, resulting in       incomplete clearance with fair visualization in some parts, adequate       visualization in other parts. No blood noted in the colon      A 4 mm polyp was found in the transverse colon. The polyp was sessile.       The polyp was removed with a cold snare. Resection and retrieval were       complete.      Internal hemorrhoids were found during retroflexion. The hemorrhoids        were small.      There was a markedly angulated entrance to the right colon with a       stenosis there due to the hernia sac in the R inguinal area (could see       the light of the colonoscope in the inguinal hernia at this area). No       frank ulcerations noted. I could see through the hernia sac into the       right colon which was filled with stool. Seeing this, and that most of       the right colon is within the hernia / scrotum, I did not attempt to       achieve cecal intubation, right colon would not have been able to be       cleared and concern for inducing trauma within the hernia sac. The exam       was otherwise without abnormality. No high risk lesions for bleeding. No       large mass lesions or large polyps, smaller or flat polyps may not have       been visualized in certain areas. Portion of the rectum could not be       cleared due to residual corn. Impression:               - Preparation of the colon was fair - extensive                            lavage performed as above                           - One 4 mm polyp in the transverse colon, removed                            with a cold snare. Resected and retrieved.                           -  Internal hemorrhoids.                           - Right colon is within a hernia sac in the scrotum                            - retained stool there and could not clear that                            area, cecal intubation not attempted. Rather                            narrowed lumen at entrance to the hernia sac.                           - The examination was otherwise normal.                           This was a limited exam - right colon could not be                            cleared due to the fact that it is within a large                            hernia sac in the scrotum, and prep was poor in                            that area. Stool throughout the rest of the colon                            as  described - no blood anywhere, no high risk                            lesions noted that could cause bleeding, however                            not all areas well visualized following extensive                            lavage. I do not think he has any obvious mass                            lesions or large polyps distal to the hernia sac.                           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                            Will discuss options with the patient and family in                            regards to pursuing capsule study or not. Recommendation:           - Return patient to hospital ward for ongoing care.                           - Advance diet as tolerated.                           - Continue present medications.                           - Await pathology results.                           - Will discuss with family / patient if they want                            to pursue capsule study or not - at risk for                            retention given narrowed colonic lumen within the                            hernia sac.                           - Monitor Hgb - patient has had no further bleeding                            since EGD Procedure Code(s):        --- Professional ---                           [phone], 52, Colonoscopy, flexible; with removal of                            tumor(s), polyp(s), or other lesion(s) by snare                            technique Diagnosis Code(s):        --- Professional ---                           D22.0, Other  hemorrhoids                           D12.3, Benign neoplasm of transverse colon (hepatic                             flexure or splenic flexure)                           K92.2, Gastrointestinal hemorrhage, unspecified                           D50.9, Iron deficiency anemia, unspecified CPT copyright 2022 American Medical Association. All rights reserved. The codes documented in this report are preliminary and upon coder review may  be revised to meet current compliance requirements. Remo Lipps P. Rameses Ou, MD 01/22/2022 11:38:47 AM This report has been signed electronically. Number of Addenda: 0

## 2022-01-22 NOTE — Transfer of Care (Signed)
Immediate Anesthesia Transfer of Care Note  Patient: Greg [name]  Procedure(s) Performed: COLONOSCOPY WITH PROPOFOL POLYPECTOMY GIVENS CAPSULE STUDY  Patient Location: PACU  Anesthesia Type:MAC  Level of Consciousness: drowsy  Airway & Oxygen Therapy: Patient Spontanous Breathing and Patient connected to nasal cannula oxygen  Post-op Assessment: Report given to RN and Post -op Vital signs reviewed and stable  Post vital signs: Reviewed and stable  Last Vitals:  Vitals Value Taken Time  BP 100/69 01/22/22 1127  Temp    Pulse 84 01/22/22 1130  Resp 17 01/22/22 1130  SpO2 96 % 01/22/22 1130  Vitals shown include unvalidated device data.  Last Pain:  Vitals:   01/22/22 1042  TempSrc: Temporal  PainSc: 3          Complications: No notable events documented.

## 2022-01-22 NOTE — Progress Notes (Addendum)
   Heart Failure Stewardship Pharmacist Progress Note   PCP: Pcp, No PCP-Cardiologist: Buford Dresser, MD    HPI:  71 yo M with PMH of AAA, HTN, cognitive impairment, ulcers, and Barrett's esophagus.   Recently admitted from 11/7-11/13 with GI bleed. ECHO during that admission showed LVEF 35-40%, regional wall motion abnormalities, G2DD, and RV normal. Planned for outpatient cath (unable to use DAPT with GIB).  He presented to the ED on 12/8 with shortness of breath and recurrent GI bleed. CXR with possible edema. Given profound GIB, invasive cath deferred again. Plan medical management only.   Current HF Medications: Diuretic: furosemide 40 mg PO daily SGLT2i: Jardiance 10 mg daily Other: Ferrlecit 250 mg IV daily x 4 doses  Prior to admission HF Medications: ACE/ARB/ARNI: lisinopril 5 mg daily AND losartan 50 mg daily  Pertinent Lab Values: Serum creatinine 1.06, BUN 6, Potassium 3.7, Sodium 139, BNP 560, Magnesium 2.2, A1c 5.4  Vital Signs: Weight: 135 lbs  Blood pressure: 100/70s  Heart rate: 70-80s  I/O: -1.8L yesterday; net -8.6L  Medication Assistance / Insurance Benefits Check: Does the patient have prescription insurance?  Pending Type of insurance plan: Medicare listed on profile, cannot find Rx details  Outpatient Pharmacy:  Prior to admission outpatient pharmacy: [facility] outpatient pharmacy Is the patient willing to use [facility] at discharge? Yes   Assessment: 1. 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 NYHA class II symptoms. - Continue furosemide 40 mg daily, hypotension improved - Consider adding BB once closer to discharge - BP soft today, consider adding ARB/ARNI and MRA once BP improves - Continue Jardiance 10 mg daily - Continue Ferrlecit 250 mg IV daily x 4 doses    Plan: 1) Medication changes recommended at this time: - Need to discontinue duplicate ACE/ARB on discharge  2) Patient assistance: - Pending  insurance benefits investigation - Need clarification for disposition on discharge (may not need assistance if going to SNF)  3)  Education  - Initial education provided - Full HF education to be completed prior to discharge (will need to have family involved with patients cognitive impairments)  Kerby Nora, PharmD, BCPS Heart Failure Cytogeneticist Phone [phone]

## 2022-01-22 NOTE — Interval H&P Note (Signed)
History and Physical Interval Note: No interval changes from yesterday. He completed prep, states he is ready for colonoscopy. Hgb stable. I have counseled him on risks / benefits of the exam and anesthesia and he wishes to proceed. Of note, he has a very large right inguinal hernia which does contain part of his colon. I explained to him we will be very cautious but possible his exam may not be able to be completed due to this, if it prevents safe passage of the colonoscope. Using pediatric colonoscope for his exam. Further recommendations pending the results.  01/22/2022 10:51 AM  Greg [name]  has presented today for surgery, with the diagnosis of Blood loss anemia.  FOBT positive stool.  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  The various methods of treatment have been discussed with the patient and family. After consideration of risks, benefits and other options for treatment, the patient has consented to  Procedure(s): COLONOSCOPY WITH PROPOFOL (N/A) GIVENS CAPSULE STUDY (N/A) as a surgical intervention.  The patient's history has been reviewed, patient examined, no change in status, stable for surgery.  I have reviewed the patient's chart and labs.  Questions were answered to the patient's satisfaction.     [place]

## 2022-01-22 NOTE — Anesthesia Procedure Notes (Signed)
Procedure Name: MAC Date/Time: 01/22/2022 10:59 AM  Performed by: Colin Jakubek, CRNAPre-anesthesia Checklist: Patient identified, Emergency Drugs available, Suction available and Patient being monitored Patient Re-evaluated:Patient Re-evaluated prior to induction Oxygen Delivery Method: Nasal cannula Induction Type: IV induction Placement Confirmation: positive ETCO2 Dental Injury: Teeth and Oropharynx as per pre-operative assessment

## 2022-01-22 NOTE — Anesthesia Preprocedure Evaluation (Addendum)
Anesthesia Evaluation  Patient identified by MRN, date of birth, ID band Patient awake    Reviewed: Allergy & Precautions, NPO status , Patient's Chart, lab work & pertinent test results  Airway Mallampati: II       Dental   Pulmonary COPD, Current Smoker and Patient abstained from smoking.   breath sounds clear to auscultation       Cardiovascular hypertension, + Past MI   Rhythm:Regular Rate:Normal     Neuro/Psych    GI/Hepatic Neg liver ROS, PUD,,,History noted Dr. Nyoka Cowden   Endo/Other  negative endocrine ROS    Renal/GU negative Renal ROS     Musculoskeletal   Abdominal   Peds  Hematology   Anesthesia Other Findings   Reproductive/Obstetrics                             Anesthesia Physical Anesthesia Plan  ASA: 3  Anesthesia Plan: MAC   Post-op Pain Management:    Induction: Intravenous  PONV Risk Score and Plan: Treatment may vary due to age or medical condition and Propofol infusion  Airway Management Planned: Nasal Cannula and Simple Face Mask  Additional Equipment:   Intra-op Plan:   Post-operative Plan:   Informed Consent: I have reviewed the patients History and Physical, chart, labs and discussed the procedure including the risks, benefits and alternatives for the proposed anesthesia with the patient or authorized representative who has indicated his/her understanding and acceptance.     Dental advisory given  Plan Discussed with: CRNA and Anesthesiologist  Anesthesia Plan Comments:        Anesthesia Quick Evaluation

## 2022-01-23 DIAGNOSIS — K922 Gastrointestinal hemorrhage, unspecified: Secondary | ICD-10-CM | POA: Diagnosis not present

## 2022-01-23 DIAGNOSIS — I5043 Acute on chronic combined systolic (congestive) and diastolic (congestive) heart failure: Secondary | ICD-10-CM | POA: Diagnosis not present

## 2022-01-23 DIAGNOSIS — D62 Acute posthemorrhagic anemia: Secondary | ICD-10-CM

## 2022-01-23 NOTE — Progress Notes (Signed)
   Heart Failure Stewardship Pharmacist Progress Note   PCP: Pcp, No PCP-Cardiologist: Buford Dresser, MD    HPI:  71 yo M with PMH of AAA, HTN, cognitive impairment, ulcers, and Barrett's esophagus.   Recently admitted from 11/7-11/13 with GI bleed. ECHO during that admission showed LVEF 35-40%, regional wall motion abnormalities, G2DD, and RV normal. Planned for outpatient cath (unable to use DAPT with GIB).  He presented to the ED on 12/8 with shortness of breath and recurrent GI bleed. CXR with possible edema. Given profound GIB, invasive cath deferred again. Plan medical management only.   Current HF Medications: Diuretic: furosemide 40 mg PO daily ACE/ARB/ARNI: losartan 12.5 mg daily SGLT2i: Jardiance 10 mg daily Other: Ferrlecit 250 mg IV daily x 4 doses  Prior to admission HF Medications: ACE/ARB/ARNI: lisinopril 5 mg daily AND losartan 50 mg daily  Pertinent Lab Values: Serum creatinine 1.06, BUN 6, Potassium 3.7, Sodium 139, BNP 560, Magnesium 2.2, A1c 5.4  Vital Signs: Weight: 135 lbs  Blood pressure: 100/60s  Heart rate: 90s  I/O: -0.2L yesterday; net -9.7L  Medication Assistance / Insurance Benefits Check: Does the patient have prescription insurance?  Pending Type of insurance plan: Medicare listed on profile, cannot find Rx details  Outpatient Pharmacy:  Prior to admission outpatient pharmacy: [facility] outpatient pharmacy Is the patient willing to use [facility] at discharge? Yes   Assessment: 1. Acute on chronic systolic CHF (LVEF 64-15%), due to presumed ICM. NYHA class II symptoms. - Continue furosemide 40 mg daily, hypotension improved - Consider adding BB once closer to discharge, could use metoprolol XL (less BP impact compared to coreg) - Agree with adding losartan 12.5 mg daily - Continue Jardiance 10 mg daily - Continue Ferrlecit 250 mg IV daily x 4 doses    Plan: 1) Medication changes recommended at this time: - Agree with changes -  Consider low dose metoprolol XL tomorrow - Need to discontinue duplicate ACE/ARB on discharge  2) Patient assistance: - Pending insurance benefits investigation - Need clarification for disposition on discharge (may not need assistance if going to SNF)  3)  Education  - Initial education provided - Full HF education to be completed prior to discharge (will need to have family involved with patients cognitive impairments)  Kerby Nora, PharmD, BCPS Heart Failure Cytogeneticist Phone [phone]

## 2022-01-23 NOTE — Progress Notes (Addendum)
PROGRESS NOTE    Greg [name]  KDT:[id number] DOB: 09/01/1950 DOA: 01/18/2022 PCP: Pcp, No   Brief Narrative:  71 year old male with history of hypertension, hyperlipidemia, recent hospitalization from 12/18/2021-12/24/2021 for upper GI bleeding secondary to nonbleeding ulcer presented with worsening shortness of breath.  On presentation, chest x-ray showed no acute abnormality.  Hemoglobin was 4.4 (10.4 on discharge 3 weeks prior).  GI consulted.  Patient was transfused packed red cells, subsequently became more hypoxic requiring BiPAP placement.  He was transferred to ICU under PCCM service.  He underwent EGD on 01/19/2022 which showed 2 areas of bleeding AVMs which were treated with APC.  Echo showed EF of 35 to 40%.  Cardiology was consulted.  He was transferred to [facility] service from 01/21/2022 onwards.  Assessment & Plan:   Acute blood loss anemia Gastric and duodenal AVMs -Presented with hemoglobin of 4.4.  Received 3 units of transfusion this admission -Hemoglobin pending this morning.  Hemoglobin 9.1 on 01/30/2022 -Underwent EGD on 01/19/2022 and was found to have single gastric and couple of duodenal AVMs that were treated with APC.  -Status post colonoscopy on 01/22/2022.  GI to decide regarding need for capsule study after discussion with patient -Monitor H&H.  Continue Protonix.  Acute hypoxic respiratory failure COPD exacerbation Tobacco abuse -Possibly from pulmonary edema and acute COPD exacerbation -Respiratory status has improved and currently on room air. -Continue current inhaled regimen.  Outpatient follow-up with pulmonary -Continue nicotine patch  Acute systolic heart failure History of hypertension Elevated troponin/demand ischemia -Cardiology following.  Continue oral Lasix, losartan and Jardiance.  Blood pressure still on the lower side.  Strict input and output.  Daily weights.  Fluid restriction.  Thrombocytopenia -Questionable cause.   Monitor  Hyperlipidemia -Continue statin  Physical deconditioning -PT eval   DVT prophylaxis: SCDs Code Status: Full Family Communication: None at bedside Disposition Plan: Status is: Inpatient Remains inpatient appropriate because: Of severity of illness  Consultants: GI/cardiology  Procedures: As above  Antimicrobials: None   Subjective: Patient seen and examined at bedside.  No overnight fever, vomiting, chest pain or worsening shortness of breath reported. Objective: Vitals:   01/22/22 1145 01/22/22 1232 01/22/22 1954 01/23/22 0834  BP: 103/72 106/75 91/63   Pulse: 83 85 89   Resp: '13 16 17   '$ Temp: 98.2 F (36.8 C) 98.3 F (36.8 C) 98.7 F (37.1 C)   TempSrc:  Oral Oral   SpO2: 99% 99% 98% 98%  Weight:      Height:        Intake/Output Summary (Last 24 hours) at 01/23/2022 1051 Last data filed at 01/23/2022 0700 Gross per 24 hour  Intake 930 ml  Output 1500 ml  Net -570 ml   Filed Weights   01/18/22 0554  Weight: 61.2 kg    Examination:  General exam: Appears calm and comfortable.  Currently on room air. Respiratory system: Bilateral decreased breath sounds at bases with some scattered crackles Cardiovascular system: S1 & S2 heard, Rate controlled Gastrointestinal system: Abdomen is nondistended, soft and nontender. Normal bowel sounds heard. Extremities: No cyanosis, clubbing; trace lower extremity edema Central nervous system: Alert and oriented.  Slow to respond.  No focal neurological deficits. Moving extremities Skin: No rashes, lesions or ulcers Psychiatry: Flat affect.  Not agitated    Data Reviewed: I have personally reviewed following labs and imaging studies  CBC: Recent Labs  Lab 01/18/22 0647 01/18/22 1854 01/18/22 2126 01/19/22 0602 01/19/22 1245 01/20/22 0033 01/21/22 0407 01/22/22 0249  WBC  4.8  --  9.3 4.4 6.6  --  6.9 5.3  NEUTROABS 3.5  --   --   --  4.7  --  4.5 3.2  HGB 4.4*   < > 8.3* 7.2* 6.9* 8.3* 9.1* 9.1*   HCT 14.6*   < > 25.9* 23.0* 22.4* 26.2* 27.7* 29.3*  MCV 83.9  --  78.7* 79.0* 80.6  --  81.7 84.7  PLT 80*  --  80* 60* 68*  --  60* 66*   < > = values in this interval not displayed.   Basic Metabolic Panel: Recent Labs  Lab 01/18/22 2126 01/19/22 0602 01/20/22 0033 01/20/22 1714 01/21/22 0407 01/22/22 0249  NA 138 139  --  134* 134* 139  K 3.6 3.7  --  4.2 3.9 3.7  CL 102 103  --  97* 98 102  CO2 24 25  --  '26 25 28  '$ GLUCOSE 172* 127*  --  103* 101* 91  BUN 18 13  --  14 12 6*  CREATININE 1.21 1.06  --  1.13 1.02 1.06  CALCIUM 8.9 8.7*  --  8.6* 8.6* 8.5*  MG 1.9 2.0 2.2  --   --   --   PHOS 3.7 4.2 3.4  --   --   --    GFR: Estimated Creatinine Clearance: 55.3 mL/min (by C-G formula based on SCr of 1.06 mg/dL). Liver Function Tests: Recent Labs  Lab 01/18/22 2126  AST 37  ALT 28  ALKPHOS 52  BILITOT 0.8  PROT 7.9  ALBUMIN 3.9   No results for input(s): "LIPASE", "AMYLASE" in the last 168 hours. No results for input(s): "AMMONIA" in the last 168 hours. Coagulation Profile: Recent Labs  Lab 01/18/22 2126  INR 1.3*   Cardiac Enzymes: No results for input(s): "CKTOTAL", "CKMB", "CKMBINDEX", "TROPONINI" in the last 168 hours. BNP (last 3 results) No results for input(s): "PROBNP" in the last 8760 hours. HbA1C: Recent Labs    01/21/22 0407  HGBA1C 5.4   CBG: No results for input(s): "GLUCAP" in the last 168 hours. Lipid Profile: Recent Labs    01/21/22 0407  CHOL 88  HDL 32*  LDLCALC 44  TRIG 58  CHOLHDL 2.8   Thyroid Function Tests: Recent Labs    01/21/22 0407  TSH 0.945   Anemia Panel: No results for input(s): "VITAMINB12", "FOLATE", "FERRITIN", "TIBC", "IRON", "RETICCTPCT" in the last 72 hours. Sepsis Labs: Recent Labs  Lab 01/18/22 2126 01/19/22 0602 01/20/22 0033 01/21/22 0823  PROCALCITON 0.21 0.41 0.33  --   LATICACIDVEN 4.0* 1.1  --  1.0    Recent Results (from the past 240 hour(s))  Resp Panel by RT-PCR (Flu A&B, Covid)  Anterior Nasal Swab     Status: None   Collection Time: 01/18/22  6:06 AM   Specimen: Anterior Nasal Swab  Result Value Ref Range Status   SARS Coronavirus 2 by RT PCR NEGATIVE NEGATIVE Final    Comment: (NOTE) SARS-CoV-2 target nucleic acids are NOT DETECTED.  The SARS-CoV-2 RNA is generally detectable in upper respiratory specimens during the acute phase of infection. The lowest concentration of SARS-CoV-2 viral copies this assay can detect is 138 copies/mL. A negative result does not preclude SARS-Cov-2 infection and should not be used as the sole basis for treatment or other patient management decisions. A negative result may occur with  improper specimen collection/handling, submission of specimen other than nasopharyngeal swab, presence of viral mutation(s) within the areas targeted by this assay,  and inadequate number of viral copies(<138 copies/mL). A negative result must be combined with clinical observations, patient history, and epidemiological information. The expected result is Negative.  Fact Sheet for Patients:  EntrepreneurPulse.com.au  Fact Sheet for Healthcare Providers:  IncredibleEmployment.be  This test is no t yet approved or cleared by the [place] FDA and  has been authorized for detection and/or diagnosis of SARS-CoV-2 by FDA under an Emergency Use Authorization (EUA). This EUA will remain  in effect (meaning this test can be used) for the duration of the COVID-19 declaration under Section 564(b)(1) of the Act, 21 U.S.C.section 360bbb-3(b)(1), unless the authorization is terminated  or revoked sooner.       Influenza A by PCR NEGATIVE NEGATIVE Final   Influenza B by PCR NEGATIVE NEGATIVE Final    Comment: (NOTE) The Xpert Xpress SARS-CoV-2/FLU/RSV plus assay is intended as an aid in the diagnosis of influenza from Nasopharyngeal swab specimens and should not be used as a sole basis for treatment. Nasal washings  and aspirates are unacceptable for Xpert Xpress SARS-CoV-2/FLU/RSV testing.  Fact Sheet for Patients: EntrepreneurPulse.com.au  Fact Sheet for Healthcare Providers: IncredibleEmployment.be  This test is not yet approved or cleared by the [place] FDA and has been authorized for detection and/or diagnosis of SARS-CoV-2 by FDA under an Emergency Use Authorization (EUA). This EUA will remain in effect (meaning this test can be used) for the duration of the COVID-19 declaration under Section 564(b)(1) of the Act, 21 U.S.C. section 360bbb-3(b)(1), unless the authorization is terminated or revoked.  Performed at [facility], [place] [address] [place], [place] [address]   MRSA Next Gen by PCR, Nasal     Status: None   Collection Time: 01/18/22  8:01 PM   Specimen: Nasal Mucosa; Nasal Swab  Result Value Ref Range Status   MRSA by PCR Next Gen NOT DETECTED NOT DETECTED Final    Comment: (NOTE) The GeneXpert MRSA Assay (FDA approved for NASAL specimens only), is one component of a comprehensive MRSA colonization surveillance program. It is not intended to diagnose MRSA infection nor to guide or monitor treatment for MRSA infections. Test performance is not FDA approved in patients less than 19 years old. Performed at [facility], [place] [address], [place], [place] [address]   Respiratory (~20 pathogens) panel by PCR     Status: None   Collection Time: 01/18/22 10:08 PM   Specimen: Nasopharyngeal Swab; Respiratory  Result Value Ref Range Status   Adenovirus NOT DETECTED NOT DETECTED Final   Coronavirus 229E NOT DETECTED NOT DETECTED Final    Comment: (NOTE) The Coronavirus on the Respiratory Panel, DOES NOT test for the novel  Coronavirus (2019 nCoV)    Coronavirus HKU1 NOT DETECTED NOT DETECTED Final   Coronavirus NL63 NOT DETECTED NOT DETECTED Final   Coronavirus OC43 NOT DETECTED NOT DETECTED Final   Metapneumovirus NOT  DETECTED NOT DETECTED Final   Rhinovirus / Enterovirus NOT DETECTED NOT DETECTED Final   Influenza A NOT DETECTED NOT DETECTED Final   Influenza B NOT DETECTED NOT DETECTED Final   Parainfluenza Virus 1 NOT DETECTED NOT DETECTED Final   Parainfluenza Virus 2 NOT DETECTED NOT DETECTED Final   Parainfluenza Virus 3 NOT DETECTED NOT DETECTED Final   Parainfluenza Virus 4 NOT DETECTED NOT DETECTED Final   Respiratory Syncytial Virus NOT DETECTED NOT DETECTED Final   Bordetella pertussis NOT DETECTED NOT DETECTED Final   Bordetella Parapertussis NOT DETECTED NOT DETECTED Final   Chlamydophila  pneumoniae NOT DETECTED NOT DETECTED Final   Mycoplasma pneumoniae NOT DETECTED NOT DETECTED Final    Comment: Performed at [facility], [place] [address] [place], [place] [address]         Radiology Studies: No results found.      Scheduled Meds:  atorvastatin  40 mg Oral Daily   Chlorhexidine Gluconate Cloth  6 each Topical Daily   empagliflozin  10 mg Oral Daily   fluticasone furoate-vilanterol  1 puff Inhalation Daily   furosemide  40 mg Oral Daily   losartan  12.5 mg Oral Daily   nicotine  14 mg Transdermal Daily   pantoprazole  40 mg Oral Q0600   sodium chloride flush  3 mL Intravenous Q12H   umeclidinium bromide  1 puff Inhalation Daily   Continuous Infusions:        Aline August, MD [facility] 01/23/2022, 10:51 AM

## 2022-01-23 NOTE — Progress Notes (Signed)
Pt placed on BIPAP for night rest.  Tolerating well. 

## 2022-01-23 NOTE — Progress Notes (Signed)
DAILY PROGRESS NOTE   Patient Name: Greg [name] Date of Encounter: 01/23/2022 Cardiologist: Buford Dresser, MD  Chief Complaint   No complaints  Patient Profile   Greg [name] is a 71 y.o. male with a hx of AAA, hypertension, cognitive impairment, and iron deficiency anemia, who is being seen 01/20/2022 for the evaluation of CHF at the request of Dr Lynetta Mare.   Subjective   Net negative 570 cc overnight - on oral lasix. BP remains in the 93'G-182'X systolic. Had colonoscopy yesterday.  Objective   Vitals:   01/22/22 1145 01/22/22 1232 01/22/22 1954 01/23/22 0834  BP: 103/72 106/75 91/63   Pulse: 83 85 89   Resp: '13 16 17   '$ Temp: 98.2 F (36.8 C) 98.3 F (36.8 C) 98.7 F (37.1 C)   TempSrc:  Oral Oral   SpO2: 99% 99% 98% 98%  Weight:      Height:        Intake/Output Summary (Last 24 hours) at 01/23/2022 1019 Last data filed at 01/23/2022 0700 Gross per 24 hour  Intake 930 ml  Output 1500 ml  Net -570 ml   Filed Weights   01/18/22 0554  Weight: 61.2 kg    Physical Exam   General appearance: alert, no distress, and unkempt Lungs: diminished breath sounds bibasilar Heart: regular rate and rhythm Extremities: extremities normal, atraumatic, no cyanosis or edema Neurologic: Grossly normal  Inpatient Medications    Scheduled Meds:  atorvastatin  40 mg Oral Daily   Chlorhexidine Gluconate Cloth  6 each Topical Daily   empagliflozin  10 mg Oral Daily   fluticasone furoate-vilanterol  1 puff Inhalation Daily   furosemide  40 mg Oral Daily   losartan  12.5 mg Oral Daily   nicotine  14 mg Transdermal Daily   pantoprazole  40 mg Oral Q0600   sodium chloride flush  3 mL Intravenous Q12H   umeclidinium bromide  1 puff Inhalation Daily    Continuous Infusions:    PRN Meds: acetaminophen **OR** acetaminophen, albuterol, hydrALAZINE, ondansetron **OR** ondansetron (ZOFRAN) IV   Labs   Results for orders placed or performed during the  hospital encounter of 01/18/22 (from the past 48 hour(s))  CBC with Differential/Platelet     Status: Abnormal   Collection Time: 01/22/22  2:49 AM  Result Value Ref Range   WBC 5.3 4.0 - 10.5 K/uL   RBC 3.46 (L) 4.22 - 5.81 MIL/uL   Hemoglobin 9.1 (L) 13.0 - 17.0 g/dL   HCT 29.3 (L) 39.0 - 52.0 %   MCV 84.7 80.0 - 100.0 fL   MCH 26.3 26.0 - 34.0 pg   MCHC 31.1 30.0 - 36.0 g/dL   RDW 18.2 (H) 11.5 - 15.5 %   Platelets 66 (L) 150 - 400 K/uL    Comment: Immature Platelet Fraction may be clinically indicated, consider ordering this additional test [id number] REPEATED TO VERIFY    nRBC 0.0 0.0 - 0.2 %   Neutrophils Relative % 60 %   Neutro Abs 3.2 1.7 - 7.7 K/uL   Lymphocytes Relative 22 %   Lymphs Abs 1.2 0.7 - 4.0 K/uL   Monocytes Relative 13 %   Monocytes Absolute 0.7 0.1 - 1.0 K/uL   Eosinophils Relative 4 %   Eosinophils Absolute 0.2 0.0 - 0.5 K/uL   Basophils Relative 1 %   Basophils Absolute 0.1 0.0 - 0.1 K/uL   Immature Granulocytes 0 %   Abs Immature Granulocytes 0.02 0.00 - 0.07 K/uL  Comment: Performed at [facility], [place] [address], [place], [place] [address]  Basic metabolic panel     Status: Abnormal   Collection Time: 01/22/22  2:49 AM  Result Value Ref Range   Sodium 139 135 - 145 mmol/L   Potassium 3.7 3.5 - 5.1 mmol/L   Chloride 102 98 - 111 mmol/L   CO2 28 22 - 32 mmol/L   Glucose, Bld 91 70 - 99 mg/dL    Comment: Glucose reference range applies only to samples taken after fasting for at least 8 hours.   BUN 6 (L) 8 - 23 mg/dL   Creatinine, Ser 1.06 0.61 - 1.24 mg/dL   Calcium 8.5 (L) 8.9 - 10.3 mg/dL   GFR, Estimated >60 >60 mL/min    Comment: (NOTE) Calculated using the CKD-EPI Creatinine Equation (2021)    Anion gap 9 5 - 15    Comment: Performed at [facility] [address], [place],  [address]    ECG   N/A  Telemetry   N/A - Personally Reviewed  Radiology    No results found.  Cardiac Studies    N/A  Assessment   Principal Problem:   Acute upper GI bleeding Active Problems:   AAA (abdominal aortic aneurysm) (HCC)   Lung nodule   Symptomatic anemia   Essential hypertension   ABLA (acute blood loss anemia)   Tobacco dependence   Dyslipidemia   Respiratory distress   AVM (arteriovenous malformation) of duodenum, acquired   Centrilobular emphysema (HCC)   Tobacco abuse   Heme positive stool   Acute systolic heart failure (HCC)   Benign neoplasm of colon   Plan   Remains hypotensive - no room to add additional HF meds at this time. Would continue current dose of oral lasix, Jardiance and low dose losartan 12.5 mg daily. Monitor BP on this regimen.   Time Spent Directly with Patient:  I have spent a total of 25 minutes with the patient reviewing hospital notes, telemetry, EKGs, labs and examining the patient as well as establishing an assessment and plan that was discussed personally with the patient.  > 50% of time was spent in direct patient care.  Length of Stay:  LOS: 5 days   Pixie Casino, MD, [facility], [place] Director of the Advanced Lipid Disorders &  Cardiovascular Risk Reduction Clinic Diplomate of the American Board of Clinical Lipidology Attending Cardiologist  Direct Dial: [phone]  Fax: [phone]  Website:  [email or web address] [email or web address] Ojo 01/23/2022, 10:19 AM

## 2022-01-23 NOTE — Progress Notes (Signed)
Mobility Specialist Progress Note   01/23/22 1100  Mobility  Activity Ambulated with assistance in hallway  Level of Assistance Contact guard assist, steadying assist  Assistive Device Other (Comment);None  Distance Ambulated (ft) 120 ft (x2)  Activity Response Tolerated well  $Mobility charge 1 Mobility   Post Mobility: 109 HR, 98% SpO2 on RA  Received in bed c/o pain in Bil feet(7/10) but agreeable to mobility. Pt expressed they used a cane at home but required no physical assistance throughout session. Gait belt used for safety w/ pt guarding railing just in case. No LOB during ambulation returned back to bed w/ no new complaints, call bell placed in reach.    Holland Falling Mobility Specialist Please contact via SecureChat or  Rehab office at [phone]

## 2022-01-23 NOTE — Progress Notes (Addendum)
Daily Rounding Note  01/23/2022, 1:36 PM  LOS: 5 days   SUBJECTIVE:   Chief complaint:     Denies BM's today.  Feeling OK.  Eatng well  OBJECTIVE:         Vital signs in last 24 hours:    Temp:  [98.7 F (37.1 C)] 98.7 F (37.1 C) (12/12 1954) Pulse Rate:  [89] 89 (12/12 1954) Resp:  [17] 17 (12/12 1954) BP: (91)/(63) 91/63 (12/12 1954) SpO2:  [98 %] 98 % (12/13 0834) Last BM Date : 01/22/22 Filed Weights   01/18/22 0554  Weight: 61.2 kg   General: looks frail and chronically ill.     Heart: RRR Chest: greatly diminished BS bil in all but upper lungs.  No adventitious sounds.  No labored breathing or cough.  Significantly weak vocal volume.   Abdomen: soft,NT, ND.  Active BS  Extremities: no CCE Neuro/Psych:  appropriate.  Pleasant.    Intake/Output from previous day: 12/12 0701 - 12/13 0700 In: 930 [P.O.:480; I.V.:200; IV Piggyback:250] Out: 1500 [Urine:1500]  Intake/Output this shift: No intake/output data recorded.  Lab Results: Recent Labs    01/21/22 0407 01/22/22 0249  WBC 6.9 5.3  HGB 9.1* 9.1*  HCT 27.7* 29.3*  PLT 60* 66*   BMET Recent Labs    01/20/22 1714 01/21/22 0407 01/22/22 0249  NA 134* 134* 139  K 4.2 3.9 3.7  CL 97* 98 102  CO2 '26 25 28  '$ GLUCOSE 103* 101* 91  BUN 14 12 6*  CREATININE 1.13 1.02 1.06  CALCIUM 8.6* 8.6* 8.5*   LFT No results for input(s): "PROT", "ALBUMIN", "AST", "ALT", "ALKPHOS", "BILITOT", "BILIDIR", "IBILI" in the last 72 hours. PT/INR No results for input(s): "LABPROT", "INR" in the last 72 hours. Hepatitis Panel No results for input(s): "HEPBSAG", "HCVAB", "HEPAIGM", "HEPBIGM" in the last 72 hours.  Studies/Results: No results found.  Scheduled Meds:  atorvastatin  40 mg Oral Daily   Chlorhexidine Gluconate Cloth  6 each Topical Daily   empagliflozin  10 mg Oral Daily   fluticasone furoate-vilanterol  1 puff Inhalation Daily   furosemide   40 mg Oral Daily   losartan  12.5 mg Oral Daily   nicotine  14 mg Transdermal Daily   pantoprazole  40 mg Oral Q0600   sodium chloride flush  3 mL Intravenous Q12H   umeclidinium bromide  1 puff Inhalation Daily   Continuous Infusions: PRN Meds:.acetaminophen **OR** acetaminophen, albuterol, ondansetron **OR** ondansetron (ZOFRAN) IV   ASSESMENT:        IDA.  Blood loss anemia.    FOBT + dark stools.  Hgb nadir 4.4.   11/ 10/2021 EGD: Short segment Barrett's.  Nonbleeding gastric ulcer.  Duodenitis. 01/19/22 EGD: APC ablation of nonbleeding, solitary gastric and a couple of duodenal AVMs.  Suspect additional AVMs deeper into the small bowel and possibly in the colon. 01/22/2022 colonoscopy: Fair prep but extensive lavage was required.  A 4 mm transverse polyp was removed.  Nonbleeding internal hemorrhoids.  Right colon resides within hernia sac at the scrotum.  The lumen at the entrance to the hernia sac was narrow.  MD unable to remove retained stool in this area.  MD did not attempt cecal intubation.  There was no blood seen at any point.  Plans to proceed with capsule/VCA were canceled for fear that the capsule may become retained in the hernia. Suspect SB AVMs in non-visualized portion of SB.   3  PRBC thus far 12/8-12/9.  Hgb 4.4.. 9.1.   Ferrlecit x 4 doses stated 12/9.      Thrombocytopenia.  Platelets 66 K.       Resp failure, hypoxia.  Emphysema.  Still smoking 1 PPD cigarettes at home.  Systolic heart failure. Per Dr Debara Pickett: due to hypotension "no room to add additional HF meds at this time"     PLAN     Per Dr Havery Moros.      Greg [name]  01/23/2022, 1:36 PM Phone [phone]     ATTENDING ATTESTATION: Patient has no had any overt bleeding, Hgb stable. Recall his EGD was remarkable for some AVMs which were ablated. Colonoscopy was fair prep, saw no high risk pathology in his colon but a portion of the ascending colon / cecum was not seen as it is within the hernia  sac in his groin. Portion of the small bowel is in the hernia sac as well. At risk for ulcerations there. We discussed doing a capsule study to further evaluate his small bowel, but he is at risk for capsule retention in his colon (severely angulated / difficult entrance into the hernia sac on colonoscopy) and also potentially retention within his small bowel in this area. We discussed risks of capsule vs. risks of not doing the capsule. He really wants to avoid surgery if at all possible on the inguinal hernia given his comorbidities. After discussion he does not wish to pursue capsule endoscopy at this time. Would observe for now and see how he does. Hopefully ablation of small bowel AVMs has provided some benefit. Continue daily CBC. Once he is out of the hospital he can follow up with our office for reassessment.   We will sign off for now, please call with questions moving forward.  Jolly Mango, MD [facility]

## 2022-01-24 ENCOUNTER — Encounter (HOSPITAL_COMMUNITY): Payer: Medicare Other

## 2022-01-24 ENCOUNTER — Ambulatory Visit: Payer: Medicare Other | Admitting: Vascular Surgery

## 2022-01-24 ENCOUNTER — Telehealth: Payer: Self-pay

## 2022-01-24 DIAGNOSIS — K922 Gastrointestinal hemorrhage, unspecified: Secondary | ICD-10-CM | POA: Diagnosis not present

## 2022-01-24 DIAGNOSIS — I5043 Acute on chronic combined systolic (congestive) and diastolic (congestive) heart failure: Secondary | ICD-10-CM | POA: Diagnosis not present

## 2022-01-24 LAB — BASIC METABOLIC PANEL
Anion gap: 10 (ref 5–15)
BUN: 14 mg/dL (ref 8–23)
CO2: 25 mmol/L (ref 22–32)
Calcium: 9.1 mg/dL (ref 8.9–10.3)
Chloride: 101 mmol/L (ref 98–111)
Creatinine, Ser: 1.3 mg/dL — ABNORMAL HIGH (ref 0.61–1.24)
GFR, Estimated: 59 mL/min — ABNORMAL LOW (ref >60)
Glucose, Bld: 111 mg/dL — ABNORMAL HIGH (ref 70–99)
Potassium: 3.8 mmol/L (ref 3.5–5.1)
Sodium: 136 mmol/L (ref 135–145)

## 2022-01-24 LAB — MAGNESIUM: Magnesium: 2.3 mg/dL (ref 1.7–2.4)

## 2022-01-24 LAB — CBC WITH DIFFERENTIAL/PLATELET
Abs Immature Granulocytes: 0.04 10*3/uL (ref 0.00–0.07)
Basophils Absolute: 0.1 10*3/uL (ref 0.0–0.1)
Basophils Relative: 1 %
Eosinophils Absolute: 0.2 10*3/uL (ref 0.0–0.5)
Eosinophils Relative: 4 %
HCT: 30.9 % — ABNORMAL LOW (ref 39.0–52.0)
Hemoglobin: 9.7 g/dL — ABNORMAL LOW (ref 13.0–17.0)
Immature Granulocytes: 1 %
Lymphocytes Relative: 14 %
Lymphs Abs: 0.8 10*3/uL (ref 0.7–4.0)
MCH: 27.2 pg (ref 26.0–34.0)
MCHC: 31.4 g/dL (ref 30.0–36.0)
MCV: 86.8 fL (ref 80.0–100.0)
Monocytes Absolute: 0.8 10*3/uL (ref 0.1–1.0)
Monocytes Relative: 13 %
Neutro Abs: 3.9 10*3/uL (ref 1.7–7.7)
Neutrophils Relative %: 67 %
Platelets: 83 10*3/uL — ABNORMAL LOW (ref 150–400)
RBC: 3.56 MIL/uL — ABNORMAL LOW (ref 4.22–5.81)
RDW: 21.2 % — ABNORMAL HIGH (ref 11.5–15.5)
WBC: 5.7 10*3/uL (ref 4.0–10.5)
nRBC: 0 % (ref 0.0–0.2)

## 2022-01-24 LAB — SURGICAL PATHOLOGY

## 2022-01-24 MED ORDER — FUROSEMIDE 20 MG PO TABS
20.0000 mg | ORAL_TABLET | Freq: Every day | ORAL | Status: DC
  Administered 2022-01-25 – 2022-01-26 (×2): 20 mg via ORAL
  Filled 2022-01-24 (×2): qty 1

## 2022-01-24 NOTE — Telephone Encounter (Signed)
The pt has EGD scheduled with [facility] on 12/29.  The pt has been advised

## 2022-01-24 NOTE — Telephone Encounter (Signed)
-----   Message from Yetta Flock, MD sent at 01/23/2022  6:00 PM EST ----- Regarding: outpatient follow up This patient will need follow up with [facility] or APP in 1 month or so post hospital follow up. Thanks

## 2022-01-24 NOTE — Progress Notes (Signed)
PROGRESS NOTE    Greg [name]  ZWC:[id number] DOB: 1950-10-15 DOA: 01/18/2022 PCP: Pcp, No   Brief Narrative:  70 year old male with history of hypertension, hyperlipidemia, recent hospitalization from 12/18/2021-12/24/2021 for upper GI bleeding secondary to nonbleeding ulcer presented with worsening shortness of breath.  On presentation, chest x-ray showed no acute abnormality.  Hemoglobin was 4.4 (10.4 on discharge 3 weeks prior).  GI consulted.  Patient was transfused packed red cells, subsequently became more hypoxic requiring BiPAP placement.  He was transferred to ICU under PCCM service.  He underwent EGD on 01/19/2022 which showed 2 areas of bleeding AVMs which were treated with APC.  Echo showed EF of 35 to 40%.  Cardiology was consulted.  He was transferred to [facility] service from 01/21/2022 onwards.  Assessment & Plan:   Acute blood loss anemia Gastric and duodenal AVMs -Presented with hemoglobin of 4.4.  Received 3 units of transfusion this admission -Hemoglobin pending this morning.  Hemoglobin 9.1 on 01/30/2022 -Underwent EGD on 01/19/2022 and was found to have single gastric and couple of duodenal AVMs that were treated with APC.  -Status post colonoscopy on 01/22/2022.  After discussion with GI, patient decided to not to pursue capsule endoscopy.  GI signed off on 01/23/2022. -Monitor H&H; hemoglobin 9.7 this morning.  Continue Protonix.  Acute hypoxic respiratory failure COPD exacerbation Tobacco abuse -Possibly from pulmonary edema and acute COPD exacerbation -Respiratory status has improved and currently on room air. -Continue current inhaled regimen.  Outpatient follow-up with pulmonary -Continue nicotine patch  Acute systolic heart failure History of hypertension Elevated troponin/demand ischemia -Cardiology following.  Continue oral Lasix, losartan and Jardiance.  Blood pressure still on the lower side.  Strict input and output.  Daily weights.  Fluid  restriction. -Creatinine has bumped up slightly today.  Thrombocytopenia -Questionable cause.  Monitor  Hyperlipidemia -Continue statin  Physical deconditioning -PT eval pending.   DVT prophylaxis: SCDs Code Status: Full Family Communication: None at bedside Disposition Plan: Status is: Inpatient Remains inpatient appropriate because: Of severity of illness.  PT eval pending.  Repeat a.m. creatinine.  Consultants: GI/cardiology  Procedures: As above  Antimicrobials: None   Subjective: Patient seen and examined at bedside.  Still feels weak and short of breath.  Denies chest pain, fever, black or bloody bowel movements.   Objective: Vitals:   01/23/22 2100 01/23/22 2340 01/24/22 0549 01/24/22 0834  BP: (!) 95/54 (!) 95/54 (!) 89/66 94/68  Pulse: 89 92 90 94  Resp: 19 (!) '22 19 18  '$ Temp: 97.8 F (36.6 C)  98.1 F (36.7 C) 98.6 F (37 C)  TempSrc: Oral  Axillary Oral  SpO2: 100% 99% 100% 100%  Weight:      Height:        Intake/Output Summary (Last 24 hours) at 01/24/2022 0950 Last data filed at 01/24/2022 0825 Gross per 24 hour  Intake 600 ml  Output 2300 ml  Net -1700 ml    Filed Weights   01/18/22 0554  Weight: 61.2 kg    Examination:  General: On 2 L oxygen via nasal cannula.  No distress ENT/neck: No thyromegaly.  JVD is not elevated  respiratory: Decreased breath sounds at bases bilaterally with some crackles; no wheezing, intermittent tachypnea  CVS: S1-S2 heard, rate controlled Abdominal: Soft, nontender, slightly distended; no organomegaly, normal bowel sounds are heard Extremities: Trace lower extremity edema; no cyanosis  CNS: Awake and alert.  Poor historian.  No focal neurologic deficit.  Moves extremities Lymph: No obvious lymphadenopathy Skin: No  obvious ecchymosis/lesions  psych: Intermittently gets agitated and angry.   Musculoskeletal: No obvious joint swelling/deformity     Data Reviewed: I have personally reviewed following  labs and imaging studies  CBC: Recent Labs  Lab 01/18/22 0647 01/18/22 1854 01/19/22 0602 01/19/22 1245 01/20/22 0033 01/21/22 0407 01/22/22 0249 01/24/22 0316  WBC 4.8   < > 4.4 6.6  --  6.9 5.3 5.7  NEUTROABS 3.5  --   --  4.7  --  4.5 3.2 3.9  HGB 4.4*   < > 7.2* 6.9* 8.3* 9.1* 9.1* 9.7*  HCT 14.6*   < > 23.0* 22.4* 26.2* 27.7* 29.3* 30.9*  MCV 83.9   < > 79.0* 80.6  --  81.7 84.7 86.8  PLT 80*   < > 60* 68*  --  60* 66* 83*   < > = values in this interval not displayed.    Basic Metabolic Panel: Recent Labs  Lab 01/18/22 2126 01/19/22 0602 01/20/22 0033 01/20/22 1714 01/21/22 0407 01/22/22 0249 01/24/22 0316  NA 138 139  --  134* 134* 139 136  K 3.6 3.7  --  4.2 3.9 3.7 3.8  CL 102 103  --  97* 98 102 101  CO2 24 25  --  '26 25 28 25  '$ GLUCOSE 172* 127*  --  103* 101* 91 111*  BUN 18 13  --  14 12 6* 14  CREATININE 1.21 1.06  --  1.13 1.02 1.06 1.30*  CALCIUM 8.9 8.7*  --  8.6* 8.6* 8.5* 9.1  MG 1.9 2.0 2.2  --   --   --  2.3  PHOS 3.7 4.2 3.4  --   --   --   --     GFR: Estimated Creatinine Clearance: 45.1 mL/min (A) (by C-G formula based on SCr of 1.3 mg/dL (H)). Liver Function Tests: Recent Labs  Lab 01/18/22 2126  AST 37  ALT 28  ALKPHOS 52  BILITOT 0.8  PROT 7.9  ALBUMIN 3.9    No results for input(s): "LIPASE", "AMYLASE" in the last 168 hours. No results for input(s): "AMMONIA" in the last 168 hours. Coagulation Profile: Recent Labs  Lab 01/18/22 2126  INR 1.3*    Cardiac Enzymes: No results for input(s): "CKTOTAL", "CKMB", "CKMBINDEX", "TROPONINI" in the last 168 hours. BNP (last 3 results) No results for input(s): "PROBNP" in the last 8760 hours. HbA1C: No results for input(s): "HGBA1C" in the last 72 hours.  CBG: No results for input(s): "GLUCAP" in the last 168 hours. Lipid Profile: No results for input(s): "CHOL", "HDL", "LDLCALC", "TRIG", "CHOLHDL", "LDLDIRECT" in the last 72 hours.  Thyroid Function Tests: No results for  input(s): "TSH", "T4TOTAL", "FREET4", "T3FREE", "THYROIDAB" in the last 72 hours.  Anemia Panel: No results for input(s): "VITAMINB12", "FOLATE", "FERRITIN", "TIBC", "IRON", "RETICCTPCT" in the last 72 hours. Sepsis Labs: Recent Labs  Lab 01/18/22 2126 01/19/22 0602 01/20/22 0033 01/21/22 0823  PROCALCITON 0.21 0.41 0.33  --   LATICACIDVEN 4.0* 1.1  --  1.0     Recent Results (from the past 240 hour(s))  Resp Panel by RT-PCR (Flu A&B, Covid) Anterior Nasal Swab     Status: None   Collection Time: 01/18/22  6:06 AM   Specimen: Anterior Nasal Swab  Result Value Ref Range Status   SARS Coronavirus 2 by RT PCR NEGATIVE NEGATIVE Final    Comment: (NOTE) SARS-CoV-2 target nucleic acids are NOT DETECTED.  The SARS-CoV-2 RNA is generally detectable in upper respiratory specimens during  the acute phase of infection. The lowest concentration of SARS-CoV-2 viral copies this assay can detect is 138 copies/mL. A negative result does not preclude SARS-Cov-2 infection and should not be used as the sole basis for treatment or other patient management decisions. A negative result may occur with  improper specimen collection/handling, submission of specimen other than nasopharyngeal swab, presence of viral mutation(s) within the areas targeted by this assay, and inadequate number of viral copies(<138 copies/mL). A negative result must be combined with clinical observations, patient history, and epidemiological information. The expected result is Negative.  Fact Sheet for Patients:  EntrepreneurPulse.com.au  Fact Sheet for Healthcare Providers:  IncredibleEmployment.be  This test is no t yet approved or cleared by the [place] FDA and  has been authorized for detection and/or diagnosis of SARS-CoV-2 by FDA under an Emergency Use Authorization (EUA). This EUA will remain  in effect (meaning this test can be used) for the duration of the COVID-19  declaration under Section 564(b)(1) of the Act, 21 U.S.C.section 360bbb-3(b)(1), unless the authorization is terminated  or revoked sooner.       Influenza A by PCR NEGATIVE NEGATIVE Final   Influenza B by PCR NEGATIVE NEGATIVE Final    Comment: (NOTE) The Xpert Xpress SARS-CoV-2/FLU/RSV plus assay is intended as an aid in the diagnosis of influenza from Nasopharyngeal swab specimens and should not be used as a sole basis for treatment. Nasal washings and aspirates are unacceptable for Xpert Xpress SARS-CoV-2/FLU/RSV testing.  Fact Sheet for Patients: EntrepreneurPulse.com.au  Fact Sheet for Healthcare Providers: IncredibleEmployment.be  This test is not yet approved or cleared by the [place] FDA and has been authorized for detection and/or diagnosis of SARS-CoV-2 by FDA under an Emergency Use Authorization (EUA). This EUA will remain in effect (meaning this test can be used) for the duration of the COVID-19 declaration under Section 564(b)(1) of the Act, 21 U.S.C. section 360bbb-3(b)(1), unless the authorization is terminated or revoked.  Performed at [facility], [place] [address], [place], [place] [address]   MRSA Next Gen by PCR, Nasal     Status: None   Collection Time: 01/18/22  8:01 PM   Specimen: Nasal Mucosa; Nasal Swab  Result Value Ref Range Status   MRSA by PCR Next Gen NOT DETECTED NOT DETECTED Final    Comment: (NOTE) The GeneXpert MRSA Assay (FDA approved for NASAL specimens only), is one component of a comprehensive MRSA colonization surveillance program. It is not intended to diagnose MRSA infection nor to guide or monitor treatment for MRSA infections. Test performance is not FDA approved in patients less than 5 years old. Performed at [facility], [place] [address], [place], [place] [address]   Respiratory (~20 pathogens) panel by PCR     Status: None   Collection Time: 01/18/22 10:08 PM   Specimen:  Nasopharyngeal Swab; Respiratory  Result Value Ref Range Status   Adenovirus NOT DETECTED NOT DETECTED Final   Coronavirus 229E NOT DETECTED NOT DETECTED Final    Comment: (NOTE) The Coronavirus on the Respiratory Panel, DOES NOT test for the novel  Coronavirus (2019 nCoV)    Coronavirus HKU1 NOT DETECTED NOT DETECTED Final   Coronavirus NL63 NOT DETECTED NOT DETECTED Final   Coronavirus OC43 NOT DETECTED NOT DETECTED Final   Metapneumovirus NOT DETECTED NOT DETECTED Final   Rhinovirus / Enterovirus NOT DETECTED NOT DETECTED Final   Influenza A NOT DETECTED NOT DETECTED Final   Influenza B NOT DETECTED NOT DETECTED Final  Parainfluenza Virus 1 NOT DETECTED NOT DETECTED Final   Parainfluenza Virus 2 NOT DETECTED NOT DETECTED Final   Parainfluenza Virus 3 NOT DETECTED NOT DETECTED Final   Parainfluenza Virus 4 NOT DETECTED NOT DETECTED Final   Respiratory Syncytial Virus NOT DETECTED NOT DETECTED Final   Bordetella pertussis NOT DETECTED NOT DETECTED Final   Bordetella Parapertussis NOT DETECTED NOT DETECTED Final   Chlamydophila pneumoniae NOT DETECTED NOT DETECTED Final   Mycoplasma pneumoniae NOT DETECTED NOT DETECTED Final    Comment: Performed at [facility], [place] [address] [place], [place] [address]         Radiology Studies: No results found.      Scheduled Meds:  atorvastatin  40 mg Oral Daily   Chlorhexidine Gluconate Cloth  6 each Topical Daily   empagliflozin  10 mg Oral Daily   fluticasone furoate-vilanterol  1 puff Inhalation Daily   furosemide  40 mg Oral Daily   losartan  12.5 mg Oral Daily   nicotine  14 mg Transdermal Daily   pantoprazole  40 mg Oral Q0600   sodium chloride flush  3 mL Intravenous Q12H   umeclidinium bromide  1 puff Inhalation Daily   Continuous Infusions:        Aline August, MD [facility] 01/24/2022, 9:50 AM

## 2022-01-24 NOTE — Progress Notes (Signed)
   Heart Failure Stewardship Pharmacist Progress Note   PCP: Pcp, No PCP-Cardiologist: Buford Dresser, MD    HPI:  71 yo M with PMH of AAA, HTN, cognitive impairment, ulcers, and Barrett's esophagus.   Recently admitted from 11/7-11/13 with GI bleed. ECHO during that admission showed LVEF 35-40%, regional wall motion abnormalities, G2DD, and RV normal. Planned for outpatient cath (unable to use DAPT with GIB).  He presented to the ED on 12/8 with shortness of breath and recurrent GI bleed. CXR with possible edema. Given profound GIB, invasive cath deferred again. Plan medical management only.   Current HF Medications: Diuretic: furosemide 40 mg PO daily ACE/ARB/ARNI: losartan 12.5 mg daily SGLT2i: Jardiance 10 mg daily Other: Ferrlecit 250 mg IV daily x 4 doses  Prior to admission HF Medications: ACE/ARB/ARNI: lisinopril 5 mg daily AND losartan 50 mg daily  Pertinent Lab Values: Serum creatinine 1.30, BUN 14, Potassium 3.8, Sodium 136, BNP 560, Magnesium 2.3, A1c 5.4  Vital Signs: Weight: 135 lbs  Blood pressure: 90/60s  Heart rate: 90s  I/O: -2.4L yesterday; net -10.8L  Medication Assistance / Insurance Benefits Check: Does the patient have prescription insurance?  Pending Type of insurance plan: Medicare listed on profile, cannot find Rx details  Outpatient Pharmacy:  Prior to admission outpatient pharmacy: [facility] outpatient pharmacy Is the patient willing to use [facility] at discharge? Yes   Assessment: 1. 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 NYHA class II symptoms. - Continue furosemide 40 mg daily - Consider adding BB once closer to discharge, could use metoprolol XL (less BP impact compared to coreg) - Continue losartan 12.5 mg daily - Continue Jardiance 10 mg daily - s/p Ferrlecit 250 mg IV x 4 doses    Plan: 1) Medication changes recommended at this time: - Consider low dose metoprolol XL  - Need to discontinue  duplicate ACE/ARB on discharge  2) Patient assistance: - Pending insurance benefits investigation - Need clarification for disposition on discharge (may not need assistance if going to SNF)  3)  Education  - Initial education provided - Full HF education to be completed prior to discharge (will need to have family involved with patients cognitive impairments)  Kerby Nora, PharmD, BCPS Heart Failure Cytogeneticist Phone [phone]

## 2022-01-24 NOTE — Progress Notes (Signed)
Rounding Note    Patient Name: Greg [name] Date of Encounter: 01/24/2022  [place] Cardiologist: Buford Dresser, MD   Subjective   Patient sitting up in bed reports no acute chest pain. He says that breathing seems to be better today. Patient does endorse feeling lightheaded/dizzy with positional changes.   Inpatient Medications    Scheduled Meds:  atorvastatin  40 mg Oral Daily   Chlorhexidine Gluconate Cloth  6 each Topical Daily   empagliflozin  10 mg Oral Daily   fluticasone furoate-vilanterol  1 puff Inhalation Daily   furosemide  40 mg Oral Daily   losartan  12.5 mg Oral Daily   nicotine  14 mg Transdermal Daily   pantoprazole  40 mg Oral Q0600   sodium chloride flush  3 mL Intravenous Q12H   umeclidinium bromide  1 puff Inhalation Daily   Continuous Infusions:  PRN Meds: acetaminophen **OR** acetaminophen, albuterol, ondansetron **OR** ondansetron (ZOFRAN) IV   Vital Signs    Vitals:   01/23/22 2100 01/23/22 2340 01/24/22 0549 01/24/22 0834  BP: (!) 95/54 (!) 95/54 (!) 89/66 94/68  Pulse: 89 92 90 94  Resp: 19 (!) '22 19 18  '$ Temp: 97.8 F (36.6 C)  98.1 F (36.7 C) 98.6 F (37 C)  TempSrc: Oral  Axillary Oral  SpO2: 100% 99% 100% 100%  Weight:      Height:        Intake/Output Summary (Last 24 hours) at 01/24/2022 1020 Last data filed at 01/24/2022 0825 Gross per 24 hour  Intake 600 ml  Output 2300 ml  Net -1700 ml      01/18/2022    5:54 AM 12/24/2021    5:22 AM 12/23/2021    5:05 AM  Last 3 Weights  Weight (lbs) 135 lb 131 lb 6.3 oz 129 lb 6.6 oz  Weight (kg) 61.236 kg 59.6 kg 58.7 kg      Telemetry    Not on telemetry currently.  ECG    No new tracing  Physical Exam   GEN: No acute distress.   Neck: No JVD Cardiac: RRR, no murmurs, rubs, or gallops.  Respiratory: Clear to auscultation bilaterally, slightly diminished in bases. GI: Soft, nontender, non-distended  MS: No edema; No deformity. Neuro:   Nonfocal  Psych: Normal affect   Labs    High Sensitivity Troponin:   Recent Labs  Lab 01/18/22 0606 01/18/22 0817 01/18/22 2126 01/19/22 0602  TROPONINIHS 78* 79* 345* 296*     Chemistry Recent Labs  Lab 01/18/22 2126 01/19/22 0602 01/20/22 0033 01/20/22 1714 01/21/22 0407 01/22/22 0249 01/24/22 0316  NA 138 139  --    < > 134* 139 136  K 3.6 3.7  --    < > 3.9 3.7 3.8  CL 102 103  --    < > 98 102 101  CO2 24 25  --    < > '25 28 25  '$ GLUCOSE 172* 127*  --    < > 101* 91 111*  BUN 18 13  --    < > 12 6* 14  CREATININE 1.21 1.06  --    < > 1.02 1.06 1.30*  CALCIUM 8.9 8.7*  --    < > 8.6* 8.5* 9.1  MG 1.9 2.0 2.2  --   --   --  2.3  PROT 7.9  --   --   --   --   --   --   ALBUMIN 3.9  --   --   --   --   --   --  AST 37  --   --   --   --   --   --   ALT 28  --   --   --   --   --   --   ALKPHOS 52  --   --   --   --   --   --   BILITOT 0.8  --   --   --   --   --   --   GFRNONAA >60 >60  --    < > >60 >60 59*  ANIONGAP 12 11  --    < > '11 9 10   '$ < > = values in this interval not displayed.    Lipids  Recent Labs  Lab 01/21/22 0407  CHOL 88  TRIG 58  HDL 32*  LDLCALC 44  CHOLHDL 2.8    Hematology Recent Labs  Lab 01/21/22 0407 01/22/22 0249 01/24/22 0316  WBC 6.9 5.3 5.7  RBC 3.39* 3.46* 3.56*  HGB 9.1* 9.1* 9.7*  HCT 27.7* 29.3* 30.9*  MCV 81.7 84.7 86.8  MCH 26.8 26.3 27.2  MCHC 32.9 31.1 31.4  RDW 18.2* 18.2* 21.2*  PLT 60* 66* 83*   Thyroid  Recent Labs  Lab 01/21/22 0407  TSH 0.945    BNP Recent Labs  Lab 01/18/22 2126 01/19/22 0602 01/21/22 0407  BNP 1,211.3* 1,129.8* 560.0*    DDimer No results for input(s): "DDIMER" in the last 168 hours.   Radiology    No results found.  Cardiac Studies   ECHO: 12/19/2021 1. Left ventricular ejection fraction, by estimation, is 35 to 40%. The left ventricle has moderately decreased function. The left ventricle demonstrates regional wall motion abnormalities with severe hypokinesis of  the mid to apical anteroseptal, inferoseptal, and anterior walls. Apical hypokinesis. This suggests LAD territory infarction. Left ventricular diastolic parameters are consistent with Grade II diastolic dysfunction (pseudonormalization). 2. Right ventricular systolic function is normal. The right ventricular size is normal. Tricuspid regurgitation signal is inadequate for assessing PA pressure. 3. The mitral valve is normal in structure. Mild mitral valve regurgitation. No evidence of mitral stenosis. 4. The aortic valve was not well visualized. Aortic valve regurgitation is not visualized. No aortic stenosis is present. 5. The inferior vena cava is dilated in size with <50% respiratory variability, suggesting right atrial pressure of 15 mmHg.  Patient Profile     Greg [name] is a 71 y.o. male with a hx of AAA, hypertension, cognitive impairment, and iron deficiency anemia, who is being seen 01/20/2022 for the evaluation of CHF at the request of Dr Lynetta Mare.   Assessment & Plan    Acute on chronic combined CHF  Patient with recent upper GI bleed admission, presented with worsening shortness of breath, BNP 1211.3. 12/19/21 TTE with LVEF 35-40%, severe hypokinesis of mid to apical anteroseptal, inferoseptal, anterior walls. Patient IV diuresed, now net negative -10.798L this admission with BNP improved to 560 by 12/11. Now on oral Lasix.   Patient remains hypotensive. He does appear euvolemic today. With hypotension/creatinine bump to 1.3, will decrease Lasix to '20mg'$  daily. Patient is endorsing orthostatic symptoms, if BP remains low and he is symptomatic tomorrow, would consider stopping Losartan. Continue Jardiance  CAD Hyperlipidemia  Patient's 12/19/21 TTE WMA indicative of prior LAD infarct. This also correlates with ECG tracings. Not currently a candidate for ischemic evaluation or cardiac intervention. There is also concern for medication compliance. Medical management is recommended  strategy at this time.  Patient s/p ablation for gastric/duodenal AVMs. Capsule study not completed by GI due to concern for possible retention, not fully clear whether all sources of bleeding identified. Would not resume ASA at this time.  Continue statin therapy      For questions or updates, please contact [place] Please consult [email or web address] for contact info under        Signed, Lily Kocher, PA-C  01/24/2022, 10:20 AM

## 2022-01-24 NOTE — Progress Notes (Signed)
Mobility Specialist Progress Note   01/24/22 1605  Mobility  Activity Ambulated with assistance in hallway  Level of Assistance Contact guard assist, steadying assist  Assistive Device Cane  Distance Ambulated (ft) 120 ft (x2)  Activity Response Tolerated well  $Mobility charge 1 Mobility   Received pt in bed c/o foot pain but agreeable. Ambulated w/o fault but x1 seated rest break d/t SOB, SpO2 99% on portable pulse ox. Returned back to bed w/o fault, call bell in reach.  Holland Falling Mobility Specialist Please contact via SecureChat or  Rehab office at [phone]

## 2022-01-24 NOTE — Evaluation (Signed)
Physical Therapy Evaluation Patient Details Name: Greg [name] MRN: [id number] DOB: 05/28/1950 Today's Date: 01/24/2022  History of Present Illness  71 y.o. male presents to [facility] hospital on 01/18/2022 with worsening SOB after recent hospitalization 11/7-11/13 with upper GIB. Hgb found to be 4.4 on admission. 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 Colonoscopy on 12/12. PMH includes HTN, HLD, GIB, AAA, COPD.  Clinical Impression  Pt presents to PT with mild deficits in strength, although he does not appear far from his baseline. Pt is able to ambulate limited household distances without support of DME, and ambulates in the hallway with use of a single crutch (similar to recent baseline). Pt will benefit from continued aggressive mobilization in an effort to improve strength and endurance. Pt declines HHPT at this time, PT provides HEP to LE strengthening.       Recommendations for follow up therapy are one component of a multi-disciplinary discharge planning process, led by the attending physician.  Recommendations may be updated based on patient status, additional functional criteria and insurance authorization.  Follow Up Recommendations  (pt declining HHPT)      Assistance Recommended at Discharge PRN (continued assistance for groceries)  Patient can return home with the following  Assist for transportation    Equipment Recommendations None recommended by PT (owns crutches)  Recommendations for Other Services       Functional Status Assessment Patient has had a recent decline in their functional status and demonstrates the ability to make significant improvements in function in a reasonable and predictable amount of time.     Precautions / Restrictions Precautions Precautions: Fall Restrictions Weight Bearing Restrictions: No      Mobility  Bed Mobility Overal bed mobility: Modified Independent             General bed mobility comments: HOB  elevated    Transfers Overall transfer level: Independent                      Ambulation/Gait Ambulation/Gait assistance: Modified independent (Device/Increase time) Gait Distance (Feet): 200 Feet Assistive device: Crutches Gait Pattern/deviations: Step-through pattern Gait velocity: functional Gait velocity interpretation: 1.31 - 2.62 ft/sec, indicative of limited community ambulator   General Gait Details: mild lateral drift but able to ambulate for household distances with support of crutch  Stairs            Wheelchair Mobility    Modified Rankin (Stroke Patients Only)       Balance Overall balance assessment: Needs assistance Sitting-balance support: No upper extremity supported, Feet supported Sitting balance-Leahy Scale: Good     Standing balance support: No upper extremity supported, During functional activity Standing balance-Leahy Scale: Good                               Pertinent Vitals/Pain Pain Assessment Pain Assessment: No/denies pain    Home Living Family/patient expects to be discharged to:: Private residence Living Arrangements: Alone Available Help at Discharge: Other (Comment) (case manager PRN with groceries) Type of Home: Apartment Home Access: Level entry       Home Layout: One level Home Equipment: Crutches;Cane - single point Additional Comments: Pt states his case manager gets groceries for him but he has no other assist    Prior Function Prior Level of Function : Independent/Modified Independent             Mobility Comments: ambulates  with support of a single crutch       Hand Dominance        Extremity/Trunk Assessment   Upper Extremity Assessment Upper Extremity Assessment: Overall WFL for tasks assessed    Lower Extremity Assessment Lower Extremity Assessment: Generalized weakness    Cervical / Trunk Assessment Cervical / Trunk Assessment: Normal  Communication   Communication:  No difficulties  Cognition Arousal/Alertness: Awake/alert Behavior During Therapy: WFL for tasks assessed/performed Overall Cognitive Status: No family/caregiver present to determine baseline cognitive functioning                                 General Comments: pt is alert and oriented, PT has a difficult time understanding pt's wants/needs at times during session. Pt declines HHPT but then is upset that PT does not have exercises. PT then provides exercise handout and pt appears upset that no other therapy options were provided.        General Comments General comments (skin integrity, edema, etc.): pt on 2L [place] upon PT arrival, PT weans to room air with stable sats in mid 90s. Pt requests return to 2L [place] at end of session despite stable sats on room air    Exercises Other Exercises Other Exercises: standing squats, hip extension and abduction at counter   Assessment/Plan    PT Assessment Patient needs continued PT services  PT Problem List Decreased strength;Decreased activity tolerance;Decreased balance;Decreased mobility;Cardiopulmonary status limiting activity       PT Treatment Interventions DME instruction;Gait training;Functional mobility training;Balance training;Neuromuscular re-education;Patient/family education    PT Goals (Current goals can be found in the Care Plan section)  Acute Rehab PT Goals Patient Stated Goal: to improve strength PT Goal Formulation: With patient Time For Goal Achievement: 02/07/22 Potential to Achieve Goals: Good    Frequency Min 2X/week     Co-evaluation               AM-PAC PT "6 Clicks" Mobility  Outcome Measure Help needed turning from your back to your side while in a flat bed without using bedrails?: None Help needed moving from lying on your back to sitting on the side of a flat bed without using bedrails?: None Help needed moving to and from a bed to a chair (including a wheelchair)?: None Help needed  standing up from a chair using your arms (e.g., wheelchair or bedside chair)?: None Help needed to walk in hospital room?: None Help needed climbing 3-5 steps with a railing? : A Little 6 Click Score: 23    End of Session   Activity Tolerance: Patient tolerated treatment well Patient left: in bed;with call bell/phone within reach;with bed alarm set Nurse Communication: Mobility status PT Visit Diagnosis: Other abnormalities of gait and mobility (R26.89);Muscle weakness (generalized) (M62.81)    Time: 1105-1130 PT Time Calculation (min) (ACUTE ONLY): 25 min   Charges:   PT Evaluation $PT Eval Low Complexity: [place], PT, DPT Acute Rehabilitation Office [phone]   Zenaida Niece 01/24/2022, 11:47 AM

## 2022-01-25 ENCOUNTER — Encounter: Payer: Self-pay | Admitting: Gastroenterology

## 2022-01-25 ENCOUNTER — Other Ambulatory Visit (HOSPITAL_COMMUNITY): Payer: Self-pay

## 2022-01-25 ENCOUNTER — Other Ambulatory Visit: Payer: Self-pay

## 2022-01-25 DIAGNOSIS — K922 Gastrointestinal hemorrhage, unspecified: Secondary | ICD-10-CM | POA: Diagnosis not present

## 2022-01-25 DIAGNOSIS — I5021 Acute systolic (congestive) heart failure: Secondary | ICD-10-CM | POA: Diagnosis not present

## 2022-01-25 DIAGNOSIS — D649 Anemia, unspecified: Secondary | ICD-10-CM

## 2022-01-25 DIAGNOSIS — K259 Gastric ulcer, unspecified as acute or chronic, without hemorrhage or perforation: Secondary | ICD-10-CM

## 2022-01-25 LAB — CBC WITH DIFFERENTIAL/PLATELET
Abs Immature Granulocytes: 0.04 10*3/uL (ref 0.00–0.07)
Basophils Absolute: 0.1 10*3/uL (ref 0.0–0.1)
Basophils Relative: 1 %
Eosinophils Absolute: 0.2 10*3/uL (ref 0.0–0.5)
Eosinophils Relative: 4 %
HCT: 33.1 % — ABNORMAL LOW (ref 39.0–52.0)
Hemoglobin: 10.1 g/dL — ABNORMAL LOW (ref 13.0–17.0)
Immature Granulocytes: 1 %
Lymphocytes Relative: 22 %
Lymphs Abs: 1.2 10*3/uL (ref 0.7–4.0)
MCH: 27.4 pg (ref 26.0–34.0)
MCHC: 30.5 g/dL (ref 30.0–36.0)
MCV: 89.7 fL (ref 80.0–100.0)
Monocytes Absolute: 0.8 10*3/uL (ref 0.1–1.0)
Monocytes Relative: 14 %
Neutro Abs: 3.2 10*3/uL (ref 1.7–7.7)
Neutrophils Relative %: 58 %
Platelets: 93 10*3/uL — ABNORMAL LOW (ref 150–400)
RBC: 3.69 MIL/uL — ABNORMAL LOW (ref 4.22–5.81)
RDW: 23.1 % — ABNORMAL HIGH (ref 11.5–15.5)
WBC: 5.5 10*3/uL (ref 4.0–10.5)
nRBC: 0 % (ref 0.0–0.2)

## 2022-01-25 LAB — BASIC METABOLIC PANEL
Anion gap: 9 (ref 5–15)
BUN: 16 mg/dL (ref 8–23)
CO2: 26 mmol/L (ref 22–32)
Calcium: 9.3 mg/dL (ref 8.9–10.3)
Chloride: 103 mmol/L (ref 98–111)
Creatinine, Ser: 1.03 mg/dL (ref 0.61–1.24)
GFR, Estimated: >60 mL/min (ref >60)
Glucose, Bld: 101 mg/dL — ABNORMAL HIGH (ref 70–99)
Potassium: 3.8 mmol/L (ref 3.5–5.1)
Sodium: 138 mmol/L (ref 135–145)

## 2022-01-25 LAB — MAGNESIUM: Magnesium: 2.3 mg/dL (ref 1.7–2.4)

## 2022-01-25 MED ORDER — PREDNISONE 20 MG PO TABS
40.0000 mg | ORAL_TABLET | Freq: Every day | ORAL | 0 refills | Status: AC
  Filled 2022-01-25: qty 14, 7d supply, fill #0

## 2022-01-25 MED ORDER — FLUTICASONE FUROATE-VILANTEROL 100-25 MCG/ACT IN AEPB
1.0000 | INHALATION_SPRAY | Freq: Every day | RESPIRATORY_TRACT | 0 refills | Status: DC
  Filled 2022-01-25: qty 60, 30d supply, fill #0

## 2022-01-25 MED ORDER — EMPAGLIFLOZIN 10 MG PO TABS
10.0000 mg | ORAL_TABLET | Freq: Every day | ORAL | 0 refills | Status: DC
  Filled 2022-01-25: qty 30, 30d supply, fill #0

## 2022-01-25 MED ORDER — METHYLPREDNISOLONE SODIUM SUCC 40 MG IJ SOLR
40.0000 mg | Freq: Every day | INTRAMUSCULAR | Status: DC
  Administered 2022-01-25 – 2022-01-26 (×2): 40 mg via INTRAVENOUS
  Filled 2022-01-25 (×2): qty 1

## 2022-01-25 MED ORDER — UMECLIDINIUM BROMIDE 62.5 MCG/ACT IN AEPB
1.0000 | INHALATION_SPRAY | Freq: Every day | RESPIRATORY_TRACT | 0 refills | Status: DC
  Filled 2022-01-25: qty 30, 30d supply, fill #0

## 2022-01-25 MED ORDER — PANTOPRAZOLE SODIUM 40 MG PO TBEC: 40.0000 mg | DELAYED_RELEASE_TABLET | Freq: Every day | ORAL | Status: DC

## 2022-01-25 MED ORDER — BUDESONIDE-FORMOTEROL FUMARATE 160-4.5 MCG/ACT IN AERO
2.0000 | INHALATION_SPRAY | Freq: Two times a day (BID) | RESPIRATORY_TRACT | 0 refills | Status: DC
  Filled 2022-01-25: qty 1, fill #0

## 2022-01-25 MED ORDER — FLUTICASONE-SALMETEROL 250-50 MCG/ACT IN AEPB
1.0000 | INHALATION_SPRAY | Freq: Two times a day (BID) | RESPIRATORY_TRACT | 0 refills | Status: AC
  Filled 2022-01-25: qty 60, 30d supply, fill #0

## 2022-01-25 MED ORDER — FUROSEMIDE 20 MG PO TABS
20.0000 mg | ORAL_TABLET | Freq: Every day | ORAL | 0 refills | Status: DC
  Filled 2022-01-25: qty 30, 30d supply, fill #0

## 2022-01-25 MED ORDER — ALBUTEROL SULFATE HFA 108 (90 BASE) MCG/ACT IN AERS
2.0000 | INHALATION_SPRAY | RESPIRATORY_TRACT | 0 refills | Status: AC | PRN
  Filled 2022-01-25: qty 6.7, 17d supply, fill #0

## 2022-01-25 NOTE — Progress Notes (Signed)
Mobility Specialist Progress Note   01/25/22 1152  Mobility  Activity Ambulated with assistance in hallway  Level of Assistance Standby assist, set-up cues, supervision of patient - no hands on  Assistive Device Cane  Distance Ambulated (ft) 350 ft  Activity Response Tolerated well  $Mobility charge 1 Mobility   Pt received in bed w/ c/o minor pain in toes but agreeable. No faults during ambulation returned back to bed w/ all needs met.  Holland Falling Mobility Specialist Please contact via SecureChat or  Rehab office at [phone]

## 2022-01-25 NOTE — Care Management Important Message (Signed)
Important Message  Patient Details  Name: Greg [name] MRN: [id number] Date of Birth: [birth year]   Medicare Important Message Given:  Yes     Hannah Beat 01/25/2022, 12:30 PM

## 2022-01-25 NOTE — Progress Notes (Signed)
Rounding Note    Patient Name: Greg [name] Date of Encounter: 01/25/2022  [place] Cardiologist: Buford Dresser, MD   Subjective   Patient comfortable appearing in bed. No acute distress. Denies chest pain, palpitations, shortness of breath. He endorses decreased sensation of lightheadedness/orthostasis today.   Inpatient Medications    Scheduled Meds:  atorvastatin  40 mg Oral Daily   Chlorhexidine Gluconate Cloth  6 each Topical Daily   empagliflozin  10 mg Oral Daily   fluticasone furoate-vilanterol  1 puff Inhalation Daily   furosemide  20 mg Oral Daily   nicotine  14 mg Transdermal Daily   pantoprazole  40 mg Oral Q0600   sodium chloride flush  3 mL Intravenous Q12H   umeclidinium bromide  1 puff Inhalation Daily   Continuous Infusions:  PRN Meds: acetaminophen **OR** acetaminophen, albuterol, ondansetron **OR** ondansetron (ZOFRAN) IV   Vital Signs    Vitals:   01/24/22 0834 01/24/22 1644 01/24/22 1958 01/25/22 0945  BP: '94/68 90/63 95/69 '$ 105/73  Pulse: 94 95 91 92  Resp: '18  18 16  '$ Temp: 98.6 F (37 C) 98.1 F (36.7 C) 98 F (36.7 C) (!) 97.3 F (36.3 C)  TempSrc: Oral Oral Axillary Oral  SpO2: 100% 100% 99% 100%  Weight:      Height:        Intake/Output Summary (Last 24 hours) at 01/25/2022 1031 Last data filed at 01/25/2022 0600 Gross per 24 hour  Intake --  Output 150 ml  Net -150 ml      01/18/2022    5:54 AM 12/24/2021    5:22 AM 12/23/2021    5:05 AM  Last 3 Weights  Weight (lbs) 135 lb 131 lb 6.3 oz 129 lb 6.6 oz  Weight (kg) 61.236 kg 59.6 kg 58.7 kg      Telemetry    Not on telemetry  ECG    No new tracing  Physical Exam   GEN: No acute distress.   Neck: No JVD Cardiac: RRR, no murmurs, rubs, or gallops.  Respiratory: Clear to auscultation bilaterally. GI: Soft, nontender, non-distended  MS: No edema; No deformity. Neuro:  Nonfocal  Psych: Normal affect   Labs    High Sensitivity  Troponin:   Recent Labs  Lab 01/18/22 0606 01/18/22 0817 01/18/22 2126 01/19/22 0602  TROPONINIHS 78* 79* 345* 296*     Chemistry Recent Labs  Lab 01/18/22 2126 01/19/22 0602 01/20/22 0033 01/20/22 1714 01/22/22 0249 01/24/22 0316 01/25/22 0432  NA 138   < >  --    < > 139 136 138  K 3.6   < >  --    < > 3.7 3.8 3.8  CL 102   < >  --    < > 102 101 103  CO2 24   < >  --    < > '28 25 26  '$ GLUCOSE 172*   < >  --    < > 91 111* 101*  BUN 18   < >  --    < > 6* 14 16  CREATININE 1.21   < >  --    < > 1.06 1.30* 1.03  CALCIUM 8.9   < >  --    < > 8.5* 9.1 9.3  MG 1.9   < > 2.2  --   --  2.3 2.3  PROT 7.9  --   --   --   --   --   --  ALBUMIN 3.9  --   --   --   --   --   --   AST 37  --   --   --   --   --   --   ALT 28  --   --   --   --   --   --   ALKPHOS 52  --   --   --   --   --   --   BILITOT 0.8  --   --   --   --   --   --   GFRNONAA >60   < >  --    < > >60 59* >60  ANIONGAP 12   < >  --    < > '9 10 9   '$ < > = values in this interval not displayed.    Lipids  Recent Labs  Lab 01/21/22 0407  CHOL 88  TRIG 58  HDL 32*  LDLCALC 44  CHOLHDL 2.8    Hematology Recent Labs  Lab 01/22/22 0249 01/24/22 0316 01/25/22 0432  WBC 5.3 5.7 5.5  RBC 3.46* 3.56* 3.69*  HGB 9.1* 9.7* 10.1*  HCT 29.3* 30.9* 33.1*  MCV 84.7 86.8 89.7  MCH 26.3 27.2 27.4  MCHC 31.1 31.4 30.5  RDW 18.2* 21.2* 23.1*  PLT 66* 83* 93*   Thyroid  Recent Labs  Lab 01/21/22 0407  TSH 0.945    BNP Recent Labs  Lab 01/18/22 2126 01/19/22 0602 01/21/22 0407  BNP 1,211.3* 1,129.8* 560.0*    DDimer No results for input(s): "DDIMER" in the last 168 hours.   Radiology    No results found.  Cardiac Studies   ECHO: 12/19/2021 1. Left ventricular ejection fraction, by estimation, is 35 to 40%. The left ventricle has moderately decreased function. The left ventricle demonstrates regional wall motion abnormalities with severe hypokinesis of the mid to apical  anteroseptal, inferoseptal, and anterior walls. Apical hypokinesis. This suggests LAD territory infarction. Left ventricular diastolic parameters are consistent with Grade II diastolic dysfunction (pseudonormalization). 2. Right ventricular systolic function is normal. The right ventricular size is normal. Tricuspid regurgitation signal is inadequate for assessing PA pressure. 3. The mitral valve is normal in structure. Mild mitral valve regurgitation. No evidence of mitral stenosis. 4. The aortic valve was not well visualized. Aortic valve regurgitation is not visualized. No aortic stenosis is present. 5. The inferior vena cava is dilated in size with <50% respiratory variability, suggesting right atrial pressure of 15 mmHg.  Patient Profile     [name] is a 71 y.o. male with a hx of AAA, hypertension, cognitive impairment, and iron deficiency anemia, who is being seen 01/20/2022 for the evaluation of CHF at the request of Dr Lynetta Mare.    Assessment & Plan    Acute on chronic combined CHF   Patient with recent upper GI bleed admission, presented with worsening shortness of breath, BNP 1211.3. 12/19/21 TTE with LVEF 35-40%, severe hypokinesis of mid to apical anteroseptal, inferoseptal, anterior walls. Patient IV diuresed, now net negative -10.798L this admission with BNP improved to 560 by 12/11. Now on oral Lasix.    Patient remains euvolemic on exam today. Continue Lasix to '20mg'$  daily.  Patient hypotensive yesterday, improved after stopping Losartan. At this time would continue to hold all BP reducing agents. Continue Jardiance Maintain K>4, Mg>2   CAD Hyperlipidemia   Patient's 12/19/21 TTE WMA indicative of prior LAD infarct. This also correlates with ECG tracings.  Not currently a candidate for ischemic evaluation or cardiac intervention. There is also concern for medication compliance. Medical management is recommended strategy at this time.    Patient s/p ablation for  gastric/duodenal AVMs. Capsule study not completed by GI due to concern for possible retention, not fully clear whether all sources of bleeding identified. Would not resume ASA at this time.  Continue statin therapy      For questions or updates, please contact [place] Please consult [email or web address] for contact info under        Signed, Lily Kocher, PA-C  01/25/2022, 10:31 AM

## 2022-01-25 NOTE — Progress Notes (Signed)
SATURATION QUALIFICATIONS: (This note is used to comply with regulatory documentation for home oxygen)  Patient Saturations on Room Air at Rest = 100%  Patient Saturations on Room Air while Ambulating = 100%  

## 2022-01-25 NOTE — TOC Progression Note (Signed)
Discharge medications (5) are being stored in the main pharmacy on the ground floor until patient is ready for discharge.   

## 2022-01-25 NOTE — Progress Notes (Signed)
PROGRESS NOTE    Greg [name]  LPF:[id number] DOB: [birth year] DOA: 01/18/2022 PCP: Pcp, No   Brief Narrative:  71 year old male with history of hypertension, hyperlipidemia, recent hospitalization from 12/18/2021-12/24/2021 for upper GI bleeding secondary to nonbleeding ulcer presented with worsening shortness of breath.  On presentation, chest x-ray showed no acute abnormality.  Hemoglobin was 4.4 (10.4 on discharge 3 weeks prior).  GI consulted.  Patient was transfused packed red cells, subsequently became more hypoxic requiring BiPAP placement.  He was transferred to ICU under PCCM service.  He underwent EGD on 01/19/2022 which showed 2 areas of bleeding AVMs which were treated with APC.  Echo showed EF of 35 to 40%.  Cardiology was consulted.  He was transferred to [facility] service from 01/21/2022 onwards.  Assessment & Plan:   Acute blood loss anemia Gastric and duodenal AVMs -Presented with hemoglobin of 4.4.  Received 3 units of transfusion this admission -Hemoglobin pending this morning.  Hemoglobin 9.1 on 01/30/2022 -Underwent EGD on 01/19/2022 and was found to have single gastric and couple of duodenal AVMs that were treated with APC.  -Status post colonoscopy on 01/22/2022.  After discussion with GI, patient decided to not to pursue capsule endoscopy.  GI signed off on 01/23/2022. -Monitor H&H; hemoglobin 10.1 this morning.  Continue Protonix.  Acute hypoxic respiratory failure COPD exacerbation Tobacco abuse -Possibly from pulmonary edema and acute COPD exacerbation -Respiratory status has improved but still intermittently requiring 2 L oxygen via nasal cannula.  Patient does not feel ready to go home yet.  Will add Solu-Medrol 40 mg IV daily. -Continue current inhaled regimen.  Outpatient follow-up with pulmonary -Continue nicotine patch  Acute systolic heart failure History of hypertension Elevated troponin/demand ischemia -Cardiology following.  Continue Jardiance and low-dose  of oral Lasix.  Losartan discontinued by cardiology because of hypotension.  Blood pressure still on the lower side.  Strict input and output.  Daily weights.  Fluid restriction. -Creatinine has improved today.  Repeat a.m. creatinine.  Thrombocytopenia -Questionable cause.  Improving.  Monitor  Hyperlipidemia -Continue statin  Physical deconditioning -Patient declining home health PT  DVT prophylaxis: SCDs Code Status: Full Family Communication: None at bedside Disposition Plan: Status is: Inpatient Remains inpatient appropriate because: Of severity of illness.  Possible discharge in a.m. if patient feels better and if cleared by cardiology  Consultants: GI/cardiology  Procedures: As above  Antimicrobials: None   Subjective: Patient seen and examined at bedside.  Still feels short of breath and weak and does not feel ready to go home today.  Does not elaborate on symptoms.  No fever, seizures, vomiting reported.   Objective: Vitals:   01/24/22 0834 01/24/22 1644 01/24/22 1958 01/25/22 0945  BP: '94/68 90/63 95/69 '$ 105/73  Pulse: 94 95 91 92  Resp: '18  18 16  '$ Temp: 98.6 F (37 C) 98.1 F (36.7 C) 98 F (36.7 C) (!) 97.3 F (36.3 C)  TempSrc: Oral Oral Axillary Oral  SpO2: 100% 100% 99% 100%  Weight:      Height:        Intake/Output Summary (Last 24 hours) at 01/25/2022 1059 Last data filed at 01/25/2022 0600 Gross per 24 hour  Intake --  Output 150 ml  Net -150 ml    Filed Weights   01/18/22 0554  Weight: 61.2 kg    Examination:  General: No acute distress.  Still on 2 L oxygen via nasal cannula.  Chronically ill and deconditioned.   ENT/neck: No obvious neck masses or JVD elevation  noted  respiratory: Bilateral decreased breath sounds at bases with some scattered crackles  CVS: Rate mostly controlled; S1 and S2 are heard  abdominal: Soft, nontender, still distended; no organomegaly, bowel sounds heard normally  extremities: No clubbing; mild lower  extremity edema present CNS: Poor historian.  Alert and awake.  Slow to respond.  No focal neurologic deficit.  Able to move extremities Lymph: No obvious palpable lymphadenopathy  skin: No obvious rashes/petechiae psych: Extremely flat affect.  Does not participate in conversation much..   Musculoskeletal: No obvious joint tenderness/erythema     Data Reviewed: I have personally reviewed following labs and imaging studies  CBC: Recent Labs  Lab 01/19/22 1245 01/20/22 0033 01/21/22 0407 01/22/22 0249 01/24/22 0316 01/25/22 0432  WBC 6.6  --  6.9 5.3 5.7 5.5  NEUTROABS 4.7  --  4.5 3.2 3.9 3.2  HGB 6.9* 8.3* 9.1* 9.1* 9.7* 10.1*  HCT 22.4* 26.2* 27.7* 29.3* 30.9* 33.1*  MCV 80.6  --  81.7 84.7 86.8 89.7  PLT 68*  --  60* 66* 83* 93*    Basic Metabolic Panel: Recent Labs  Lab 01/18/22 2126 01/19/22 0602 01/20/22 0033 01/20/22 1714 01/21/22 0407 01/22/22 0249 01/24/22 0316 01/25/22 0432  NA 138 139  --  134* 134* 139 136 138  K 3.6 3.7  --  4.2 3.9 3.7 3.8 3.8  CL 102 103  --  97* 98 102 101 103  CO2 24 25  --  '26 25 28 25 26  '$ GLUCOSE 172* 127*  --  103* 101* 91 111* 101*  BUN 18 13  --  14 12 6* 14 16  CREATININE 1.21 1.06  --  1.13 1.02 1.06 1.30* 1.03  CALCIUM 8.9 8.7*  --  8.6* 8.6* 8.5* 9.1 9.3  MG 1.9 2.0 2.2  --   --   --  2.3 2.3  PHOS 3.7 4.2 3.4  --   --   --   --   --     GFR: Estimated Creatinine Clearance: 56.9 mL/min (by C-G formula based on SCr of 1.03 mg/dL). Liver Function Tests: Recent Labs  Lab 01/18/22 2126  AST 37  ALT 28  ALKPHOS 52  BILITOT 0.8  PROT 7.9  ALBUMIN 3.9    No results for input(s): "LIPASE", "AMYLASE" in the last 168 hours. No results for input(s): "AMMONIA" in the last 168 hours. Coagulation Profile: Recent Labs  Lab 01/18/22 2126  INR 1.3*    Cardiac Enzymes: No results for input(s): "CKTOTAL", "CKMB", "CKMBINDEX", "TROPONINI" in the last 168 hours. BNP (last 3 results) No results for input(s): "PROBNP" in  the last 8760 hours. HbA1C: No results for input(s): "HGBA1C" in the last 72 hours.  CBG: No results for input(s): "GLUCAP" in the last 168 hours. Lipid Profile: No results for input(s): "CHOL", "HDL", "LDLCALC", "TRIG", "CHOLHDL", "LDLDIRECT" in the last 72 hours.  Thyroid Function Tests: No results for input(s): "TSH", "T4TOTAL", "FREET4", "T3FREE", "THYROIDAB" in the last 72 hours.  Anemia Panel: No results for input(s): "VITAMINB12", "FOLATE", "FERRITIN", "TIBC", "IRON", "RETICCTPCT" in the last 72 hours. Sepsis Labs: Recent Labs  Lab 01/18/22 2126 01/19/22 0602 01/20/22 0033 01/21/22 0823  PROCALCITON 0.21 0.41 0.33  --   LATICACIDVEN 4.0* 1.1  --  1.0     Recent Results (from the past 240 hour(s))  Resp Panel by RT-PCR (Flu A&B, Covid) Anterior Nasal Swab     Status: None   Collection Time: 01/18/22  6:06 AM   Specimen: Anterior  Nasal Swab  Result Value Ref Range Status   SARS Coronavirus 2 by RT PCR NEGATIVE NEGATIVE Final    Comment: (NOTE) SARS-CoV-2 target nucleic acids are NOT DETECTED.  The SARS-CoV-2 RNA is generally detectable in upper respiratory specimens during the acute phase of infection. The lowest concentration of SARS-CoV-2 viral copies this assay can detect is 138 copies/mL. A negative result does not preclude SARS-Cov-2 infection and should not be used as the sole basis for treatment or other patient management decisions. A negative result may occur with  improper specimen collection/handling, submission of specimen other than nasopharyngeal swab, presence of viral mutation(s) within the areas targeted by this assay, and inadequate number of viral copies(<138 copies/mL). A negative result must be combined with clinical observations, patient history, and epidemiological information. The expected result is Negative.  Fact Sheet for Patients:  EntrepreneurPulse.com.au  Fact Sheet for Healthcare Providers:   IncredibleEmployment.be  This test is no t yet approved or cleared by the [place] FDA and  has been authorized for detection and/or diagnosis of SARS-CoV-2 by FDA under an Emergency Use Authorization (EUA). This EUA will remain  in effect (meaning this test can be used) for the duration of the COVID-19 declaration under Section 564(b)(1) of the Act, 21 U.S.C.section 360bbb-3(b)(1), unless the authorization is terminated  or revoked sooner.       Influenza A by PCR NEGATIVE NEGATIVE Final   Influenza B by PCR NEGATIVE NEGATIVE Final    Comment: (NOTE) The Xpert Xpress SARS-CoV-2/FLU/RSV plus assay is intended as an aid in the diagnosis of influenza from Nasopharyngeal swab specimens and should not be used as a sole basis for treatment. Nasal washings and aspirates are unacceptable for Xpert Xpress SARS-CoV-2/FLU/RSV testing.  Fact Sheet for Patients: EntrepreneurPulse.com.au  Fact Sheet for Healthcare Providers: IncredibleEmployment.be  This test is not yet approved or cleared by the [place] FDA and has been authorized for detection and/or diagnosis of SARS-CoV-2 by FDA under an Emergency Use Authorization (EUA). This EUA will remain in effect (meaning this test can be used) for the duration of the COVID-19 declaration under Section 564(b)(1) of the Act, 21 U.S.C. section 360bbb-3(b)(1), unless the authorization is terminated or revoked.  Performed at [facility], [place] [address] [place], [place] [address]   MRSA Next Gen by PCR, Nasal     Status: None   Collection Time: 01/18/22  8:01 PM   Specimen: Nasal Mucosa; Nasal Swab  Result Value Ref Range Status   MRSA by PCR Next Gen NOT DETECTED NOT DETECTED Final    Comment: (NOTE) The GeneXpert MRSA Assay (FDA approved for NASAL specimens only), is one component of a comprehensive MRSA colonization surveillance program. It is not intended to diagnose  MRSA infection nor to guide or monitor treatment for MRSA infections. Test performance is not FDA approved in patients less than 78 years old. Performed at [facility], [place] [address] [place], [place] [address]   Respiratory (~20 pathogens) panel by PCR     Status: None   Collection Time: 01/18/22 10:08 PM   Specimen: Nasopharyngeal Swab; Respiratory  Result Value Ref Range Status   Adenovirus NOT DETECTED NOT DETECTED Final   Coronavirus 229E NOT DETECTED NOT DETECTED Final    Comment: (NOTE) The Coronavirus on the Respiratory Panel, DOES NOT test for the novel  Coronavirus (2019 nCoV)    Coronavirus HKU1 NOT DETECTED NOT DETECTED Final   Coronavirus NL63 NOT DETECTED NOT DETECTED Final  Coronavirus OC43 NOT DETECTED NOT DETECTED Final   Metapneumovirus NOT DETECTED NOT DETECTED Final   Rhinovirus / Enterovirus NOT DETECTED NOT DETECTED Final   Influenza A NOT DETECTED NOT DETECTED Final   Influenza B NOT DETECTED NOT DETECTED Final   Parainfluenza Virus 1 NOT DETECTED NOT DETECTED Final   Parainfluenza Virus 2 NOT DETECTED NOT DETECTED Final   Parainfluenza Virus 3 NOT DETECTED NOT DETECTED Final   Parainfluenza Virus 4 NOT DETECTED NOT DETECTED Final   Respiratory Syncytial Virus NOT DETECTED NOT DETECTED Final   Bordetella pertussis NOT DETECTED NOT DETECTED Final   Bordetella Parapertussis NOT DETECTED NOT DETECTED Final   Chlamydophila pneumoniae NOT DETECTED NOT DETECTED Final   Mycoplasma pneumoniae NOT DETECTED NOT DETECTED Final    Comment: Performed at [facility], [place] [address] [place], [place] [address]         Radiology Studies: No results found.      Scheduled Meds:  atorvastatin  40 mg Oral Daily   Chlorhexidine Gluconate Cloth  6 each Topical Daily   empagliflozin  10 mg Oral Daily   fluticasone furoate-vilanterol  1 puff Inhalation Daily   furosemide  20 mg Oral Daily   nicotine  14 mg Transdermal Daily   pantoprazole  40 mg  Oral Q0600   sodium chloride flush  3 mL Intravenous Q12H   umeclidinium bromide  1 puff Inhalation Daily   Continuous Infusions:        Aline August, MD [facility] 01/25/2022, 10:59 AM

## 2022-01-26 DIAGNOSIS — R0603 Acute respiratory distress: Secondary | ICD-10-CM

## 2022-01-26 DIAGNOSIS — I10 Essential (primary) hypertension: Secondary | ICD-10-CM

## 2022-01-26 LAB — BASIC METABOLIC PANEL
Anion gap: 10 (ref 5–15)
BUN: 17 mg/dL (ref 8–23)
CO2: 23 mmol/L (ref 22–32)
Calcium: 9.4 mg/dL (ref 8.9–10.3)
Chloride: 103 mmol/L (ref 98–111)
Creatinine, Ser: 0.9 mg/dL (ref 0.61–1.24)
GFR, Estimated: >60 mL/min (ref >60)
Glucose, Bld: 116 mg/dL — ABNORMAL HIGH (ref 70–99)
Potassium: 4.2 mmol/L (ref 3.5–5.1)
Sodium: 136 mmol/L (ref 135–145)

## 2022-01-26 LAB — CBC WITH DIFFERENTIAL/PLATELET
Abs Immature Granulocytes: 0.1 10*3/uL — ABNORMAL HIGH (ref 0.00–0.07)
Basophils Absolute: 0 10*3/uL (ref 0.0–0.1)
Basophils Relative: 0 %
Eosinophils Absolute: 0 10*3/uL (ref 0.0–0.5)
Eosinophils Relative: 0 %
HCT: 34.3 % — ABNORMAL LOW (ref 39.0–52.0)
Hemoglobin: 10.4 g/dL — ABNORMAL LOW (ref 13.0–17.0)
Lymphocytes Relative: 15 %
Lymphs Abs: 0.9 10*3/uL (ref 0.7–4.0)
MCH: 27.5 pg (ref 26.0–34.0)
MCHC: 30.3 g/dL (ref 30.0–36.0)
MCV: 90.7 fL (ref 80.0–100.0)
Monocytes Absolute: 0.3 10*3/uL (ref 0.1–1.0)
Monocytes Relative: 5 %
Myelocytes: 1 %
Neutro Abs: 4.7 10*3/uL (ref 1.7–7.7)
Neutrophils Relative %: 79 %
Platelets: 108 10*3/uL — ABNORMAL LOW (ref 150–400)
RBC: 3.78 MIL/uL — ABNORMAL LOW (ref 4.22–5.81)
RDW: 23.9 % — ABNORMAL HIGH (ref 11.5–15.5)
WBC: 6 10*3/uL (ref 4.0–10.5)
nRBC: 0 % (ref 0.0–0.2)
nRBC: 0 /100 WBC

## 2022-01-26 LAB — MAGNESIUM: Magnesium: 2.3 mg/dL (ref 1.7–2.4)

## 2022-01-26 NOTE — Progress Notes (Signed)
Chart reviewed. Patient is scheduled for discharge today.   Patient admitted with GI bleed.   TTE showed reduced EF with wall motion abnormalities. From a cardiac perspective, therapy for CHF is limited by hypotension. We would not prescribe antiplatelet medications for possible CAD due to GI bleeding.  - continue atorvastatin, jardiance, and lasix - follow-up with cardiology as an outpatient

## 2022-01-26 NOTE — Progress Notes (Signed)
[facility] filled prescriptions for  Albuterol inhaler, Advair Diskus inhaler, empagliflozin, lasix, and prednisone.  As previously noted by RN, patient refused these medications on discharge and left without them.  These prescriptions will be returned to the pharmacy and placed on profile to be filled or transferred to pharmacy of choice in the event the patient changes his mind and wants to obtain these medications.  [employer], Pharm.D., BCPS Clinical Pharmacist 01/26/2022 1:47 PM      albuterol (VENTOLIN HFA) 108 (90 Base) MCG/ACT inhaler  Sig: Inhale 2 puffs into the lungs every 4 (four) hours as needed for wheezing or shortness of breath.    Start: 01/25/22    Quantity: 6.7 g Refills: 0        empagliflozin (JARDIANCE) 10 MG TABS tablet  Sig: Take 1 tablet (10 mg total) by mouth daily.    Start: 01/26/22    Quantity: 30 tablet Refills: 0        fluticasone-salmeterol (ADVAIR DISKUS) 250-50 MCG/ACT AEPB  Sig: Inhale 1 puff into the lungs in the morning and at bedtime.    Start: 01/25/22    Quantity: 60 each Refills: 0        furosemide (LASIX) 20 MG tablet  Sig: Take 1 tablet (20 mg total) by mouth daily.    Start: 01/26/22    Quantity: 30 tablet Refills: 0        predniSONE (DELTASONE) 20 MG tablet  Sig: Take 2 tablets (40 mg total) by mouth daily with breakfast for 7 days.    Start: 01/26/22

## 2022-01-26 NOTE — Discharge Summary (Signed)
Physician Discharge Summary  Greg [name] BOF:[id number] DOB: 26-Apr-1950 DOA: 01/18/2022  PCP: Pcp, No  Admit date: 01/18/2022 Discharge date: 01/26/2022  Admitted From: Home Disposition: Home  Recommendations for Outpatient Follow-up:  Follow up with PCP in 1 week with repeat CBC/BMP Outpatient follow-up with cardiology and pulmonary Follow up in ED if symptoms worsen or new appear   Home Health: Refused home health PT. Equipment/Devices: None  Discharge Condition: Stable CODE STATUS: Full Diet recommendation: Heart healthy/fluid restriction of up to 1200 cc a day  Brief/Interim Summary: 71 year old male with history of hypertension, hyperlipidemia, recent hospitalization from 12/18/2021-12/24/2021 for upper GI bleeding secondary to nonbleeding ulcer presented with worsening shortness of breath.  On presentation, chest x-ray showed no acute abnormality.  Hemoglobin was 4.4 (10.4 on discharge 3 weeks prior).  GI consulted.  Patient was transfused packed red cells, subsequently became more hypoxic requiring BiPAP placement.  He was transferred to ICU under PCCM service.  He underwent EGD on 01/19/2022 which showed 2 areas of bleeding AVMs which were treated with APC.  Echo showed EF of 35 to 40%.  Cardiology was consulted.  He was transferred to [facility] service from 01/21/2022 onwards.  Subsequently, his condition has improved.  His respiratory status has improved as well.  He is currently on oral Lasix.  GI has signed off and hemoglobin is stable.  Cardiology has cleared him for discharge.  Discharge him home today.  He refused home health PT.  Discharge Diagnoses:   Acute blood loss anemia Gastric and duodenal AVMs -Presented with hemoglobin of 4.4.  Received 3 units of transfusion this admission -Hemoglobin pending this morning.  Hemoglobin 9.1 on 01/30/2022 -Underwent EGD on 01/19/2022 and was found to have single gastric and couple of duodenal AVMs that were treated with APC.  -Status  post colonoscopy on 01/22/2022.  After discussion with GI, patient decided to not to pursue capsule endoscopy.  GI signed off on 01/23/2022. -hemoglobin 10.4 this morning.  Continue Protonix.  Outpatient follow-up with GI. -Outpatient follow-up of CBC. -Discharge patient home today.  Tolerating diet currently.   Acute hypoxic respiratory failure COPD exacerbation Tobacco abuse -Possibly from pulmonary edema and acute COPD exacerbation -Respiratory status has improved.  Currently on IV Solu-Medrol.  Switch to prednisone 40 mg daily for 7 days and discharged home today on inhaled Advair twice daily along with as needed inhaled albuterol. -Outpatient follow-up with pulmonary -Counseled regarding tobacco cessation   Acute systolic heart failure History of hypertension Elevated troponin/demand ischemia -Cardiology following.  Continue Jardiance and low-dose of oral Lasix.  Losartan discontinued by cardiology because of hypotension.  Blood pressure still on the lower side but stable.  Continue diet and fluid restriction. -Cardiology has cleared the patient for discharge on current regimen.  Outpatient follow-up with cardiology.   Thrombocytopenia -Questionable cause.  Improving.  Outpatient follow-up  Hyperlipidemia -Continue statin   Physical deconditioning -Patient declining home health PT  Discharge Instructions  Discharge Instructions     Ambulatory referral to Cardiology   Complete by: As directed    Diet - low sodium heart healthy   Complete by: As directed    Increase activity slowly   Complete by: As directed       Allergies as of 01/26/2022   No Known Allergies      Medication List     STOP taking these medications    lisinopril 5 MG tablet Commonly known as: ZESTRIL   losartan 50 MG tablet Commonly known as: COZAAR  TAKE these medications    acetaminophen 325 MG tablet Commonly known as: TYLENOL Take 2 tablets (650 mg total) by mouth every 6  (six) hours as needed for mild pain (or Fever >/= 101).   albuterol 108 (90 Base) MCG/ACT inhaler Commonly known as: VENTOLIN HFA Inhale 2 puffs into the lungs every 4 (four) hours as needed for wheezing or shortness of breath.   aspirin EC 81 MG tablet Take 1 tablet (81 mg total) by mouth daily. Swallow whole.   atorvastatin 40 MG tablet Commonly known as: LIPITOR Take 1 tablet (40 mg total) by mouth daily.   fluticasone-salmeterol 250-50 MCG/ACT Aepb Commonly known as: Advair Diskus Inhale 1 puff into the lungs in the morning and at bedtime.   furosemide 20 MG tablet Commonly known as: LASIX Take 1 tablet (20 mg total) by mouth daily.   Jardiance 10 MG Tabs tablet Generic drug: empagliflozin Take 1 tablet (10 mg total) by mouth daily.   OVER THE COUNTER MEDICATION Over the counter pain relief   pantoprazole 40 MG tablet Commonly known as: PROTONIX Take 1 tablet (40 mg total) by mouth daily. What changed: when to take this   predniSONE 20 MG tablet Commonly known as: DELTASONE Take 2 tablets (40 mg total) by mouth daily with breakfast for 7 days.        Follow-up Information     [facility]. Schedule an appointment as soon as possible for a visit in 1 month(s).   Specialty: Pulmonology Why: Call for an appt to be seen in January for pulmonary nodule follow up. Contact information: [place] [address] [place] [id number] [phone]        PCP. Schedule an appointment as soon as possible for a visit in 1 week(s).   Why: with repeat cbc/bmp               No Known Allergies  Consultations: GI/cardiology/PCCM   Procedures/Studies: DG Chest Portable 1 View  Result Date: 01/18/2022 CLINICAL DATA:  Shortness of breath. EXAM: PORTABLE CHEST 1 VIEW COMPARISON:  January 18, 2022 FINDINGS: EKG leads project over the chest. Cardiomediastinal contours and hilar structures are stable. Diffuse interstitial prominence has  developed since the study of the same date at 6:08 a.m. No signs of pleural effusion or pneumothorax. No lobar level consolidative process. Areas most pronounced at the medial lung bases with respect to interstitial and alveolar changes. On limited assessment no acute skeletal findings. IMPRESSION: Diffuse interstitial prominence has developed since the study of the same date at 6:08 AM. Would consider the possibility of pulmonary edema, aspiration changes or atypical infection. Electronically Signed   By: Zetta Bills M.D.   On: 01/18/2022 16:12   DG Chest Port 1 View  Result Date: 01/18/2022 CLINICAL DATA:  Shortness of breath. EXAM: PORTABLE CHEST 1 VIEW COMPARISON:  12/18/2021 FINDINGS: 0608 hours. Lungs are hyperexpanded. The lungs are clear without focal pneumonia, edema, pneumothorax or pleural effusion. Cardiopericardial silhouette is at upper limits of normal for size. The visualized bony structures of the thorax are unremarkable. Telemetry leads overlie the chest. IMPRESSION: Hyperexpansion without acute cardiopulmonary findings. Electronically Signed   By: Misty Stanley M.D.   On: 01/18/2022 06:22      Subjective: Patient seen and examined at bedside.  Poor historian.  Intermittently gets angry and upset.  No fever, chest pain or worsening shortness of breath reported.  Discharge Exam: Vitals:   01/26/22 0608 01/26/22 0903  BP: 113/77 106/76  Pulse: 84 87  Resp: 16 18  Temp: (!) 97.5 F (36.4 C) 98.3 F (36.8 C)  SpO2: 100% 100%    General: Pt is alert, awake, not in acute distress.  Looks chronically ill and deconditioned.  Poor historian.  Slow to respond.  Intermittently gets angry and upset. Cardiovascular: rate controlled, S1/S2 + Respiratory: bilateral decreased breath sounds at bases Abdominal: Soft, NT, ND, bowel sounds + Extremities: Trace lower extremity edema; no cyanosis    The results of significant diagnostics from this hospitalization (including imaging,  microbiology, ancillary and laboratory) are listed below for reference.     Microbiology: Recent Results (from the past 240 hour(s))  Resp Panel by RT-PCR (Flu A&B, Covid) Anterior Nasal Swab     Status: None   Collection Time: 01/18/22  6:06 AM   Specimen: Anterior Nasal Swab  Result Value Ref Range Status   SARS Coronavirus 2 by RT PCR NEGATIVE NEGATIVE Final    Comment: (NOTE) SARS-CoV-2 target nucleic acids are NOT DETECTED.  The SARS-CoV-2 RNA is generally detectable in upper respiratory specimens during the acute phase of infection. The lowest concentration of SARS-CoV-2 viral copies this assay can detect is 138 copies/mL. A negative result does not preclude SARS-Cov-2 infection and should not be used as the sole basis for treatment or other patient management decisions. A negative result may occur with  improper specimen collection/handling, submission of specimen other than nasopharyngeal swab, presence of viral mutation(s) within the areas targeted by this assay, and inadequate number of viral copies(<138 copies/mL). A negative result must be combined with clinical observations, patient history, and epidemiological information. The expected result is Negative.  Fact Sheet for Patients:  EntrepreneurPulse.com.au  Fact Sheet for Healthcare Providers:  IncredibleEmployment.be  This test is no t yet approved or cleared by the [place] FDA and  has been authorized for detection and/or diagnosis of SARS-CoV-2 by FDA under an Emergency Use Authorization (EUA). This EUA will remain  in effect (meaning this test can be used) for the duration of the COVID-19 declaration under Section 564(b)(1) of the Act, 21 U.S.C.section 360bbb-3(b)(1), unless the authorization is terminated  or revoked sooner.       Influenza A by PCR NEGATIVE NEGATIVE Final   Influenza B by PCR NEGATIVE NEGATIVE Final    Comment: (NOTE) The Xpert Xpress  SARS-CoV-2/FLU/RSV plus assay is intended as an aid in the diagnosis of influenza from Nasopharyngeal swab specimens and should not be used as a sole basis for treatment. Nasal washings and aspirates are unacceptable for Xpert Xpress SARS-CoV-2/FLU/RSV testing.  Fact Sheet for Patients: EntrepreneurPulse.com.au  Fact Sheet for Healthcare Providers: IncredibleEmployment.be  This test is not yet approved or cleared by the [place] FDA and has been authorized for detection and/or diagnosis of SARS-CoV-2 by FDA under an Emergency Use Authorization (EUA). This EUA will remain in effect (meaning this test can be used) for the duration of the COVID-19 declaration under Section 564(b)(1) of the Act, 21 U.S.C. section 360bbb-3(b)(1), unless the authorization is terminated or revoked.  Performed at [facility], [place] [address], [place],  [address]   MRSA Next Gen by PCR, Nasal     Status: None   Collection Time: 01/18/22  8:01 PM   Specimen: Nasal Mucosa; Nasal Swab  Result Value Ref Range Status   MRSA by PCR Next Gen NOT DETECTED NOT DETECTED Final    Comment: (NOTE) The GeneXpert MRSA Assay (FDA approved for NASAL specimens only), is one component  of a comprehensive MRSA colonization surveillance program. It is not intended to diagnose MRSA infection nor to guide or monitor treatment for MRSA infections. Test performance is not FDA approved in patients less than 48 years old. Performed at [facility], [place] [address], [place], [place] [address]   Respiratory (~20 pathogens) panel by PCR     Status: None   Collection Time: 01/18/22 10:08 PM   Specimen: Nasopharyngeal Swab; Respiratory  Result Value Ref Range Status   Adenovirus NOT DETECTED NOT DETECTED Final   Coronavirus 229E NOT DETECTED NOT DETECTED Final    Comment: (NOTE) The Coronavirus on the Respiratory Panel, DOES NOT test for the novel  Coronavirus (2019  nCoV)    Coronavirus HKU1 NOT DETECTED NOT DETECTED Final   Coronavirus NL63 NOT DETECTED NOT DETECTED Final   Coronavirus OC43 NOT DETECTED NOT DETECTED Final   Metapneumovirus NOT DETECTED NOT DETECTED Final   Rhinovirus / Enterovirus NOT DETECTED NOT DETECTED Final   Influenza A NOT DETECTED NOT DETECTED Final   Influenza B NOT DETECTED NOT DETECTED Final   Parainfluenza Virus 1 NOT DETECTED NOT DETECTED Final   Parainfluenza Virus 2 NOT DETECTED NOT DETECTED Final   Parainfluenza Virus 3 NOT DETECTED NOT DETECTED Final   Parainfluenza Virus 4 NOT DETECTED NOT DETECTED Final   Respiratory Syncytial Virus NOT DETECTED NOT DETECTED Final   Bordetella pertussis NOT DETECTED NOT DETECTED Final   Bordetella Parapertussis NOT DETECTED NOT DETECTED Final   Chlamydophila pneumoniae NOT DETECTED NOT DETECTED Final   Mycoplasma pneumoniae NOT DETECTED NOT DETECTED Final    Comment: Performed at [facility], [place]. [address] [place], [place] [address]     Labs: BNP (last 3 results) Recent Labs    01/18/22 2126 01/19/22 0602 01/21/22 0407  BNP 1,211.3* 1,129.8* 366.4*   Basic Metabolic Panel: Recent Labs  Lab 01/20/22 0033 01/20/22 1714 01/21/22 0407 01/22/22 0249 01/24/22 0316 01/25/22 0432 01/26/22 0246  NA  --    < > 134* 139 136 138 136  K  --    < > 3.9 3.7 3.8 3.8 4.2  CL  --    < > 98 102 101 103 103  CO2  --    < > '25 28 25 26 23  '$ GLUCOSE  --    < > 101* 91 111* 101* 116*  BUN  --    < > 12 6* '14 16 17  '$ CREATININE  --    < > 1.02 1.06 1.30* 1.03 0.90  CALCIUM  --    < > 8.6* 8.5* 9.1 9.3 9.4  MG 2.2  --   --   --  2.3 2.3 2.3  PHOS 3.4  --   --   --   --   --   --    < > = values in this interval not displayed.   Liver Function Tests: No results for input(s): "AST", "ALT", "ALKPHOS", "BILITOT", "PROT", "ALBUMIN" in the last 168 hours. No results for input(s): "LIPASE", "AMYLASE" in the last 168 hours. No results for input(s): "AMMONIA" in the last 168  hours. CBC: Recent Labs  Lab 01/21/22 0407 01/22/22 0249 01/24/22 0316 01/25/22 0432 01/26/22 0246  WBC 6.9 5.3 5.7 5.5 6.0  NEUTROABS 4.5 3.2 3.9 3.2 4.7  HGB 9.1* 9.1* 9.7* 10.1* 10.4*  HCT 27.7* 29.3* 30.9* 33.1* 34.3*  MCV 81.7 84.7 86.8 89.7 90.7  PLT 60* 66* 83* 93* 108*   Cardiac Enzymes: No  results for input(s): "CKTOTAL", "CKMB", "CKMBINDEX", "TROPONINI" in the last 168 hours. BNP: Invalid input(s): "POCBNP" CBG: No results for input(s): "GLUCAP" in the last 168 hours. D-Dimer No results for input(s): "DDIMER" in the last 72 hours. Hgb A1c No results for input(s): "HGBA1C" in the last 72 hours. Lipid Profile No results for input(s): "CHOL", "HDL", "LDLCALC", "TRIG", "CHOLHDL", "LDLDIRECT" in the last 72 hours. Thyroid function studies No results for input(s): "TSH", "T4TOTAL", "T3FREE", "THYROIDAB" in the last 72 hours.  Invalid input(s): "FREET3" Anemia work up No results for input(s): "VITAMINB12", "FOLATE", "FERRITIN", "TIBC", "IRON", "RETICCTPCT" in the last 72 hours. Urinalysis    Component Value Date/Time   COLORURINE STRAW (A) 12/18/2021 1945   APPEARANCEUR CLEAR 12/18/2021 1945   LABSPEC 1.014 12/18/2021 1945   PHURINE 6.0 12/18/2021 1945   GLUCOSEU NEGATIVE 12/18/2021 1945   HGBUR NEGATIVE 12/18/2021 1945   BILIRUBINUR NEGATIVE 12/18/2021 1945   KETONESUR NEGATIVE 12/18/2021 1945   PROTEINUR NEGATIVE 12/18/2021 1945   NITRITE NEGATIVE 12/18/2021 1945   LEUKOCYTESUR NEGATIVE 12/18/2021 1945   Sepsis Labs Recent Labs  Lab 01/22/22 0249 01/24/22 0316 01/25/22 0432 01/26/22 0246  WBC 5.3 5.7 5.5 6.0   Microbiology Recent Results (from the past 240 hour(s))  Resp Panel by RT-PCR (Flu A&B, Covid) Anterior Nasal Swab     Status: None   Collection Time: 01/18/22  6:06 AM   Specimen: Anterior Nasal Swab  Result Value Ref Range Status   SARS Coronavirus 2 by RT PCR NEGATIVE NEGATIVE Final    Comment: (NOTE) SARS-CoV-2 target nucleic acids are  NOT DETECTED.  The SARS-CoV-2 RNA is generally detectable in upper respiratory specimens during the acute phase of infection. The lowest concentration of SARS-CoV-2 viral copies this assay can detect is 138 copies/mL. A negative result does not preclude SARS-Cov-2 infection and should not be used as the sole basis for treatment or other patient management decisions. A negative result may occur with  improper specimen collection/handling, submission of specimen other than nasopharyngeal swab, presence of viral mutation(s) within the areas targeted by this assay, and inadequate number of viral copies(<138 copies/mL). A negative result must be combined with clinical observations, patient history, and epidemiological information. The expected result is Negative.  Fact Sheet for Patients:  EntrepreneurPulse.com.au  Fact Sheet for Healthcare Providers:  IncredibleEmployment.be  This test is no t yet approved or cleared by the [place] FDA and  has been authorized for detection and/or diagnosis of SARS-CoV-2 by FDA under an Emergency Use Authorization (EUA). This EUA will remain  in effect (meaning this test can be used) for the duration of the COVID-19 declaration under Section 564(b)(1) of the Act, 21 U.S.C.section 360bbb-3(b)(1), unless the authorization is terminated  or revoked sooner.       Influenza A by PCR NEGATIVE NEGATIVE Final   Influenza B by PCR NEGATIVE NEGATIVE Final    Comment: (NOTE) The Xpert Xpress SARS-CoV-2/FLU/RSV plus assay is intended as an aid in the diagnosis of influenza from Nasopharyngeal swab specimens and should not be used as a sole basis for treatment. Nasal washings and aspirates are unacceptable for Xpert Xpress SARS-CoV-2/FLU/RSV testing.  Fact Sheet for Patients: EntrepreneurPulse.com.au  Fact Sheet for Healthcare Providers: IncredibleEmployment.be  This test is not  yet approved or cleared by the [place] FDA and has been authorized for detection and/or diagnosis of SARS-CoV-2 by FDA under an Emergency Use Authorization (EUA). This EUA will remain in effect (meaning this test can be used) for the duration of the  COVID-19 declaration under Section 564(b)(1) of the Act, 21 U.S.C. section 360bbb-3(b)(1), unless the authorization is terminated or revoked.  Performed at [facility], [place] [address] [place], [place] [address]   MRSA Next Gen by PCR, Nasal     Status: None   Collection Time: 01/18/22  8:01 PM   Specimen: Nasal Mucosa; Nasal Swab  Result Value Ref Range Status   MRSA by PCR Next Gen NOT DETECTED NOT DETECTED Final    Comment: (NOTE) The GeneXpert MRSA Assay (FDA approved for NASAL specimens only), is one component of a comprehensive MRSA colonization surveillance program. It is not intended to diagnose MRSA infection nor to guide or monitor treatment for MRSA infections. Test performance is not FDA approved in patients less than 98 years old. Performed at [facility], [place] [address], [place], [place] [address]   Respiratory (~20 pathogens) panel by PCR     Status: None   Collection Time: 01/18/22 10:08 PM   Specimen: Nasopharyngeal Swab; Respiratory  Result Value Ref Range Status   Adenovirus NOT DETECTED NOT DETECTED Final   Coronavirus 229E NOT DETECTED NOT DETECTED Final    Comment: (NOTE) The Coronavirus on the Respiratory Panel, DOES NOT test for the novel  Coronavirus (2019 nCoV)    Coronavirus HKU1 NOT DETECTED NOT DETECTED Final   Coronavirus NL63 NOT DETECTED NOT DETECTED Final   Coronavirus OC43 NOT DETECTED NOT DETECTED Final   Metapneumovirus NOT DETECTED NOT DETECTED Final   Rhinovirus / Enterovirus NOT DETECTED NOT DETECTED Final   Influenza A NOT DETECTED NOT DETECTED Final   Influenza B NOT DETECTED NOT DETECTED Final   Parainfluenza Virus 1 NOT DETECTED NOT DETECTED Final   Parainfluenza  Virus 2 NOT DETECTED NOT DETECTED Final   Parainfluenza Virus 3 NOT DETECTED NOT DETECTED Final   Parainfluenza Virus 4 NOT DETECTED NOT DETECTED Final   Respiratory Syncytial Virus NOT DETECTED NOT DETECTED Final   Bordetella pertussis NOT DETECTED NOT DETECTED Final   Bordetella Parapertussis NOT DETECTED NOT DETECTED Final   Chlamydophila pneumoniae NOT DETECTED NOT DETECTED Final   Mycoplasma pneumoniae NOT DETECTED NOT DETECTED Final    Comment: Performed at [facility], [place]. [address], [place],  [address]     Time coordinating discharge: 35 minutes  SIGNED:   Aline August, MD  [facility] 01/26/2022, 11:50 AM

## 2022-01-26 NOTE — Progress Notes (Addendum)
Pt discharged to home. DC instructions given. Pt not receptive. Pt became, all of a sudden, angry and very agitated for reasons unbeknownst to this Probation officer. Discharge instructions still given; however pt refuse inhalers at the bedside and also reported that he did not want meds from the pharmacy. Angrily stated, "I don't want your medicine, I don't want that mess!" Pharmacy made aware of pt's refusal. Sister in to transport pt home. Sister reported that pt gets this way all the time and there is nothing they can do about it. Pt left unit in wheelchair pushed by Zack,NT. Left in stable condition.

## 2022-01-27 ENCOUNTER — Encounter (HOSPITAL_COMMUNITY): Payer: Self-pay | Admitting: Gastroenterology

## 2022-01-28 ENCOUNTER — Other Ambulatory Visit (HOSPITAL_COMMUNITY): Payer: Self-pay

## 2022-02-01 ENCOUNTER — Other Ambulatory Visit (HOSPITAL_COMMUNITY): Payer: Self-pay

## 2022-02-05 ENCOUNTER — Other Ambulatory Visit (HOSPITAL_COMMUNITY): Payer: Self-pay

## 2022-02-06 NOTE — Progress Notes (Incomplete)
Cardiology Office Note:    Date:  02/06/2022   ID:  Greg [name], DOB 1950-05-01, MRN [id number]  PCP:  Pcp, No  Cardiologist:  Buford Dresser, MD  Referring MD: Aline August, MD   CC: Post-hospital follow up   History of Present Illness:    Greg [name] is a 71 y.o. male with a hx of NSTEMI, HFrEF, abdominal aortic aneurysm, aneurysm of bilateral common iliac artery, thrombocytopenia, iron deficiency anemia, hypertension, hypokalemia, Barrett's esophagus, chronic gastric ulcer with hemorrhage, who is seen for follow-up today.   He was initially referred to GI for iron deficiency anemia. Blood work revealed hemoglobin 6.1 and he was directed to the ED. Per ED notes 12/18/2021, personally reviewed: "Chest x-ray is negative for acute pulmonary disease.  Globin is 5.7 and platelets 112,000.  Fecal occult blood testing is positive.  Troponin was elevated to 112 and then 147.  Potassium was 3.2.  CTA of the chest/abdomen/pelvis notable for 3.3 x 3.4 cm AAA without dissection, aneurysm of right iliac artery with small dissection flap, aneurysm of left common iliac artery, mild dilatation of proximal celiac artery with small dissection flap, emphysema, and 4 mm RML nodule." Per EGD performed 12/20/21, his acute anemia was thought likely secondary to gastric ulcer. His Echo showed LVEF 31-54%, grade 2 diastolic dysfunction, mild MVR, and apical hypokinesis suggesting LAD territory infarction. Per cardiology consult, likely elevated troponin in the setting of profound anemia and due to anemia likely causing the peri-infarct ischemia, not a candidate for cath. Cardiology reviewed telemetry and felt he had a 2-1 AV block overnight likely secondary to probable undiagnosed OSA. Lisinopril was changed to Cozaar, and spironolactone was added. Started aspirin and statin. Prior to discharge, his BP dropped from 84/61 to 78/64 standing with some dizziness. He received bolus of normal saline with repeat  blood pressure 116/69. Cardiology recommended discontinuation of prn Lasix as well as spironolactone on discharge.  Overall,  He denies any palpitations, chest pain, shortness of breath, or peripheral edema. No lightheadedness, headaches, syncope, orthopnea, or PND.  Past Medical History:  Diagnosis Date   Barrett's esophagus    Gastric ulcer     Past Surgical History:  Procedure Laterality Date   BIOPSY  12/20/2021   Procedure: BIOPSY;  Surgeon: Thornton Park, MD;  Location: [facility];  Service: Gastroenterology;;   COLONOSCOPY WITH PROPOFOL N/A 01/22/2022   Procedure: COLONOSCOPY WITH PROPOFOL;  Surgeon: Yetta Flock, MD;  Location: [place];  Service: Gastroenterology;  Laterality: N/A;   ESOPHAGOGASTRODUODENOSCOPY (EGD) WITH PROPOFOL N/A 12/20/2021   Procedure: ESOPHAGOGASTRODUODENOSCOPY (EGD) WITH PROPOFOL;  Surgeon: Thornton Park, MD;  [facility]: [facility];  Service: Gastroenterology;  Laterality: N/A;   ESOPHAGOGASTRODUODENOSCOPY (EGD) WITH PROPOFOL N/A 01/19/2022   Procedure: ESOPHAGOGASTRODUODENOSCOPY (EGD) WITH PROPOFOL;  Surgeon: Mauri Pole, MD;  Location: [place];  Service: Gastroenterology;  Laterality: N/A;   GIVENS CAPSULE STUDY N/A 01/22/2022   Procedure: GIVENS CAPSULE STUDY;  Surgeon: Yetta Flock, MD;  Location: [place];  Service: Gastroenterology;  Laterality: N/A;   HOT HEMOSTASIS N/A 01/19/2022   Procedure: HOT HEMOSTASIS (ARGON PLASMA COAGULATION/BICAP);  Surgeon: Mauri Pole, MD;  Location: [place];  Service: Gastroenterology;  Laterality: N/A;   POLYPECTOMY  01/22/2022   Procedure: POLYPECTOMY;  Surgeon: Yetta Flock, MD;  Location: [facility];  Service: Gastroenterology;;    Current Medications: Current Outpatient Medications on File Prior to Visit  Medication Sig   acetaminophen (TYLENOL) 325 MG tablet Take 2 tablets (650 mg total) by  mouth every 6 (six) hours as needed for mild pain (or  Fever >/= 101).   albuterol (VENTOLIN HFA) 108 (90 Base) MCG/ACT inhaler Inhale 2 puffs into the lungs every 4 (four) hours as needed for wheezing or shortness of breath.   aspirin EC 81 MG tablet Take 1 tablet (81 mg total) by mouth daily. Swallow whole.   atorvastatin (LIPITOR) 40 MG tablet Take 1 tablet (40 mg total) by mouth daily.   empagliflozin (JARDIANCE) 10 MG TABS tablet Take 1 tablet (10 mg total) by mouth daily.   fluticasone-salmeterol (ADVAIR DISKUS) 250-50 MCG/ACT AEPB Inhale 1 puff into the lungs in the morning and at bedtime.   furosemide (LASIX) 20 MG tablet Take 1 tablet (20 mg total) by mouth daily.   OVER THE COUNTER MEDICATION Over the counter pain relief   pantoprazole (PROTONIX) 40 MG tablet Take 1 tablet (40 mg total) by mouth daily.   No current facility-administered medications on file prior to visit.     Allergies:   Patient has no known allergies.   Social History   Tobacco Use   Smoking status: Every Day    Packs/day: 1.00    Types: Cigarettes   Smokeless tobacco: Never  Vaping Use   Vaping Use: Never used  Substance Use Topics   Alcohol use: Not Currently    Comment: former   Drug use: Yes    Types: Marijuana    Comment: "partially"    Family History: family history is negative for Stomach cancer, Colon cancer, Esophageal cancer, and Pancreatic cancer.  ROS:   Please see the history of present illness.  All other systems are reviewed and negative.    EKGs/Labs/Other Studies Reviewed:    The following studies were reviewed today:  Echo  12/19/2021: Sonographer Comments: Unable to complete exam, patient being moved from  the ED to the floor.   IMPRESSIONS   1. Left ventricular ejection fraction, by estimation, is 35 to 40%. The  left ventricle has moderately decreased function. The left ventricle  demonstrates regional wall motion abnormalities with severe hypokinesis of  the mid to apical anteroseptal,  inferoseptal, and anterior  walls. Apical hypokinesis. This suggests LAD  territory infarction. Left ventricular diastolic parameters are consistent  with Grade II diastolic dysfunction (pseudonormalization).   2. Right ventricular systolic function is normal. The right ventricular  size is normal. Tricuspid regurgitation signal is inadequate for assessing  PA pressure.   3. The mitral valve is normal in structure. Mild mitral valve  regurgitation. No evidence of mitral stenosis.   4. The aortic valve was not well visualized. Aortic valve regurgitation  is not visualized. No aortic stenosis is present.   5. The inferior vena cava is dilated in size with <50% respiratory  variability, suggesting right atrial pressure of 15 mmHg.   CTA Chest/Abd/Pel  12/18/2021: IMPRESSION: 1. Aortic atherosclerosis with aneurysmal dilatation of the distal abdominal aorta measuring 3.3 x 3.4 cm. No dissection is seen. Recommend follow-up every 3 years. 2. 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 A vascular surgery consultation is recommended. 3. 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 4. 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 5. Advanced emphysema. 6. 4 mm right middle lobe nodule. No follow-up needed if patient is low-risk.This recommendation follows the consensus statement: Guidelines for Management of Incidental  Pulmonary Nodules Detected on CT Images: From the Fleischner Society 2017; Radiology 2017; 284:228-243. 7. Enlarged right inguinal hernia extending into the scrotal sac on the right containing nonobstructed small and large bowel and a portion of the mesentery.  EKG:  EKG is personally reviewed.   02/06/2022: ***  Recent Labs: 01/18/2022: ALT 28 01/21/2022: B Natriuretic Peptide 560.0; TSH 0.945 01/26/2022: BUN 17; Creatinine, Ser  0.90; Hemoglobin 10.4; Magnesium 2.3; Platelets 108; Potassium 4.2; Sodium 136   Recent Lipid Panel    Component Value Date/Time   CHOL 88 01/21/2022 0407   TRIG 58 01/21/2022 0407   HDL 32 (L) 01/21/2022 0407   CHOLHDL 2.8 01/21/2022 0407   VLDL 12 01/21/2022 0407   LDLCALC 44 01/21/2022 0407    Physical Exam:    VS:  There were no vitals taken for this visit.    Wt Readings from Last 3 Encounters:  01/18/22 135 lb (61.2 kg)  12/24/21 131 lb 6.3 oz (59.6 kg)  12/18/21 132 lb (59.9 kg)    GEN: Well nourished, well developed in no acute distress HEENT: Normal, moist mucous membranes NECK: No JVD CARDIAC: regular rhythm, normal S1 and S2, no rubs or gallops. No murmur. VASCULAR: Radial and DP pulses 2+ bilaterally. No carotid bruits RESPIRATORY:  Clear to auscultation without rales, wheezing or rhonchi  ABDOMEN: Soft, non-tender, non-distended MUSCULOSKELETAL:  Ambulates independently SKIN: Warm and dry, no edema NEUROLOGIC:  Alert and oriented x 3. No focal neuro deficits noted. PSYCHIATRIC:  Normal affect    ASSESSMENT:    No diagnosis found. PLAN:     Cardiac risk counseling and prevention recommendations: -recommend heart healthy/Mediterranean diet, with whole grains, fruits, vegetable, fish, lean meats, nuts, and olive oil. Limit salt. -recommend moderate walking, 3-5 times/week for 30-50 minutes each session. Aim for at least 150 minutes.week. Goal should be pace of 3 miles/hours, or walking 1.5 miles in 30 minutes -recommend avoidance of tobacco products. Avoid excess alcohol. -ASCVD risk score: The ASCVD Risk score (Arnett DK, et al., 2019) failed to calculate for the following reasons:   The patient has a prior MI or stroke diagnosis    Plan for follow up: *** or sooner as needed.  Buford Dresser, MD, PhD, [facility]    Medication Adjustments/Labs and Tests Ordered: Current medicines are reviewed at length with the patient  today.  Concerns regarding medicines are outlined above.   No orders of the defined types were placed in this encounter.  No orders of the defined types were placed in this encounter.  There are no Patient Instructions on file for this visit.   I,Mathew Stumpf,acting as a Education administrator for [employer], MD.,have documented all relevant documentation on the behalf of Buford Dresser, MD,as directed by  Buford Dresser, MD while in the presence of Buford Dresser, MD.  ***  Signed, Buford Dresser, MD PhD 02/06/2022 1:51 PM    [place]

## 2022-02-08 ENCOUNTER — Encounter: Payer: Medicare Other | Admitting: Gastroenterology

## 2022-02-13 ENCOUNTER — Ambulatory Visit (HOSPITAL_BASED_OUTPATIENT_CLINIC_OR_DEPARTMENT_OTHER): Payer: Medicare Other | Admitting: Cardiology

## 2022-02-20 NOTE — Progress Notes (Deleted)
02/20/2022 Greg [name] [id number] 10/19/1950  Referring provider: No ref. provider found Primary GI doctor: Dr. Rush Landmark  ASSESSMENT AND PLAN:   There are no diagnoses linked to this encounter.   Patient Care Team: Pcp, No as PCP - General Buford Dresser, MD as PCP - Cardiology (Cardiology)  HISTORY OF PRESENT ILLNESS: 72 y.o. male with a past medical history of history of coronary artery disease, large left inguinal hernia for which he has declined surgery, AAA, IDA, thrombocytopenia, emphysema and others listed below presents for evaluation of ***.  12/2021 admission for IDA underwent endoscopy which showed short segment Barrett's esophagus, nonbleeding gastric ulcer, refused colonoscopy.  This was have repeat 8-week EGD 01/18/2022 hospital visit for worsening IDA hemoglobin 4.4 down from 10.4 over 3 weeks.  BUN normal, dark stools.  Received 2 units. 01/19/2022 EGD solitary gastric and a couple duodenal AVMs status post ablation, suspect deeper AVMs in the small bowel possibly in the colon. 01/22/2022 colonoscopy fair prep but requiring extensive lavage 4 mm transverse polyp, nonbleeding internal hemorrhoids, right colon resides within hernia sac scrotum, MD unable to remove retained stool in the area cannot attempt cecal intubation plans to proceed with capsule.  He  reports that he has been smoking cigarettes. He has been smoking an average of 1 pack per day. He has never used smokeless tobacco. He reports that he does not currently use alcohol. He reports current drug use. Drug: Marijuana.  Current Medications:   Current Outpatient Medications (Endocrine & Metabolic):    empagliflozin (JARDIANCE) 10 MG TABS tablet, Take 1 tablet (10 mg total) by mouth daily.  Current Outpatient Medications (Cardiovascular):    atorvastatin (LIPITOR) 40 MG tablet, Take 1 tablet (40 mg total) by mouth daily.   furosemide (LASIX) 20 MG tablet, Take 1 tablet (20 mg total) by mouth  daily.  Current Outpatient Medications (Respiratory):    albuterol (VENTOLIN HFA) 108 (90 Base) MCG/ACT inhaler, Inhale 2 puffs into the lungs every 4 (four) hours as needed for wheezing or shortness of breath.   fluticasone-salmeterol (ADVAIR DISKUS) 250-50 MCG/ACT AEPB, Inhale 1 puff into the lungs in the morning and at bedtime.  Current Outpatient Medications (Analgesics):    acetaminophen (TYLENOL) 325 MG tablet, Take 2 tablets (650 mg total) by mouth every 6 (six) hours as needed for mild pain (or Fever >/= 101).   aspirin EC 81 MG tablet, Take 1 tablet (81 mg total) by mouth daily. Swallow whole.   Current Outpatient Medications (Other):    OVER THE COUNTER MEDICATION, Over the counter pain relief   pantoprazole (PROTONIX) 40 MG tablet, Take 1 tablet (40 mg total) by mouth daily.  Medical History:  Past Medical History:  Diagnosis Date   Barrett's esophagus    Gastric ulcer    Allergies: No Known Allergies   Surgical History:  He  has a past surgical history that includes Esophagogastroduodenoscopy (egd) with propofol (N/A, 12/20/2021); biopsy (12/20/2021); Esophagogastroduodenoscopy (egd) with propofol (N/A, 01/19/2022); Hot hemostasis (N/A, 01/19/2022); Colonoscopy with propofol (N/A, 01/22/2022); polypectomy (01/22/2022); and Givens capsule study (N/A, 01/22/2022). Family History:  His family history is not on file.  REVIEW OF SYSTEMS  : All other systems reviewed and negative except where noted in the History of Present Illness.  PHYSICAL EXAM: There were no vitals taken for this visit. General:   Pleasant, well developed male in no acute distress Head:   Normocephalic and atraumatic. Eyes:  sclerae anicteric,conjunctive pink  Heart:   {HEART EXAM HEM/ONC:21750} Pulm:  Clear anteriorly; no wheezing Abdomen:   {BlankSingle:19197::"Distended","Ridged","Soft"}, {BlankSingle:19197::"Flat","Obese","Non-distended"} AB, {BlankSingle:19197::"Absent","Hyperactive,  tinkling","Hypoactive","Sluggish","Active"} bowel sounds. {actendernessAB:27319} tenderness {anatomy; site abdomen:5010}. {BlankMultiple:19196::"Without guarding","With guarding","Without rebound","With rebound"}, No organomegaly appreciated. Rectal: {acrectalexam:27461} Extremities:  {With/Without:304960234} edema. Msk: Symmetrical without gross deformities. Peripheral pulses intact.  Neurologic:  Alert and  oriented x4;  No focal deficits.  Skin:   Dry and intact without significant lesions or rashes. Psychiatric:  Cooperative. Normal mood and affect.  RELEVANT LABS AND IMAGING: CBC    Component Value Date/Time   WBC 6.0 01/26/2022 0246   RBC 3.78 (L) 01/26/2022 0246   HGB 10.4 (L) 01/26/2022 0246   HCT 34.3 (L) 01/26/2022 0246   PLT 108 (L) 01/26/2022 0246   MCV 90.7 01/26/2022 0246   MCH 27.5 01/26/2022 0246   MCHC 30.3 01/26/2022 0246   RDW 23.9 (H) 01/26/2022 0246   LYMPHSABS 0.9 01/26/2022 0246   MONOABS 0.3 01/26/2022 0246   EOSABS 0.0 01/26/2022 0246   BASOSABS 0.0 01/26/2022 0246    CMP     Component Value Date/Time   NA 136 01/26/2022 0246   K 4.2 01/26/2022 0246   CL 103 01/26/2022 0246   CO2 23 01/26/2022 0246   GLUCOSE 116 (H) 01/26/2022 0246   BUN 17 01/26/2022 0246   CREATININE 0.90 01/26/2022 0246   CALCIUM 9.4 01/26/2022 0246   PROT 7.9 01/18/2022 2126   ALBUMIN 3.9 01/18/2022 2126   AST 37 01/18/2022 2126   ALT 28 01/18/2022 2126   ALKPHOS 52 01/18/2022 2126   BILITOT 0.8 01/18/2022 2126   GFRNONAA >60 01/26/2022 0246     Vladimir Crofts, PA-C 1:34 PM

## 2022-02-22 ENCOUNTER — Ambulatory Visit: Payer: Medicare Other | Admitting: Physician Assistant

## 2022-02-27 ENCOUNTER — Inpatient Hospital Stay: Payer: Medicare Other | Admitting: Pulmonary Disease

## 2022-02-27 NOTE — Progress Notes (Deleted)
Synopsis: Referred in January 2024 for pulmonary nodules by No ref. provider found  Subjective:   PATIENT ID: Greg [name] GENDER: male DOB: 1950-03-13, MRN: [id number]  No chief complaint on file.   This is a 72 year old gentleman, significant past medical history, AAA, hypertension, ischemic cardiomyopathy, heart failure with reduced ejection fraction, duodenal AVM, poor dentition, history of GI bleeding, longstanding use of tobacco.Presents after hospitalization for evaluation of abnormal CT imaging.  Patient was found to have a 4 mm right middle lobe pulmonary nodule on CT imaging in November 2023.  Patient has evidence of centrilobular and paraseptal emphysema with a presumed diagnosis of COPD.  No outpatient PFTs completed.  He was discharged on Incruse plus Breo.  Recommending follow-up for his 4 mm right middle lobe pulmonary nodule.    Past Medical History:  Diagnosis Date   Barrett's esophagus    Gastric ulcer      Family History  Problem Relation Age of Onset   Stomach cancer Neg Hx    Colon cancer Neg Hx    Esophageal cancer Neg Hx    Pancreatic cancer Neg Hx      Past Surgical History:  Procedure Laterality Date   BIOPSY  12/20/2021   Procedure: BIOPSY;  Surgeon: Thornton Park, MD;  [facility]: [facility];  Service: Gastroenterology;;   COLONOSCOPY WITH PROPOFOL N/A 01/22/2022   Procedure: COLONOSCOPY WITH PROPOFOL;  Surgeon: Yetta Flock, MD;  Location: [place];  Service: Gastroenterology;  Laterality: N/A;   ESOPHAGOGASTRODUODENOSCOPY (EGD) WITH PROPOFOL N/A 12/20/2021   Procedure: ESOPHAGOGASTRODUODENOSCOPY (EGD) WITH PROPOFOL;  Surgeon: Thornton Park, MD;  [facility];  Service: Gastroenterology;  Laterality: N/A;   ESOPHAGOGASTRODUODENOSCOPY (EGD) WITH PROPOFOL N/A 01/19/2022   Procedure: ESOPHAGOGASTRODUODENOSCOPY (EGD) WITH PROPOFOL;  Surgeon: Mauri Pole, MD;  Location: [place];  Service: Gastroenterology;   Laterality: N/A;   GIVENS CAPSULE STUDY N/A 01/22/2022   Procedure: GIVENS CAPSULE STUDY;  Surgeon: Yetta Flock, MD;  Location: [place];  Service: Gastroenterology;  Laterality: N/A;   HOT HEMOSTASIS N/A 01/19/2022   Procedure: HOT HEMOSTASIS (ARGON PLASMA COAGULATION/BICAP);  Surgeon: Mauri Pole, MD;  Location: [place];  Service: Gastroenterology;  Laterality: N/A;   POLYPECTOMY  01/22/2022   Procedure: POLYPECTOMY;  Surgeon: Yetta Flock, MD;  Location: [facility];  Service: Gastroenterology;;    Social History   Socioeconomic History   Marital status: Legally Separated    Spouse name: Not on file   Number of children: Not on file   Years of education: Not on file   Highest education level: Not on file  Occupational History   Occupation: retired  Tobacco Use   Smoking status: Every Day    Packs/day: 1.00    Types: Cigarettes   Smokeless tobacco: Never  Vaping Use   Vaping Use: Never used  Substance and Sexual Activity   Alcohol use: Not Currently    Comment: former   Drug use: Yes    Types: Marijuana    Comment: "partially"   Sexual activity: Not on file  Other Topics Concern   Not on file  Social History Narrative   Not on file   Social Determinants of Health   Financial Resource Strain: Not on file  Food Insecurity: Not on file  Transportation Needs: Not on file  Physical Activity: Not on file  Stress: Not on file  Social Connections: Not on file  Intimate Partner Violence: Not on file     No Known Allergies  Outpatient Medications Prior to Visit  Medication Sig Dispense Refill   acetaminophen (TYLENOL) 325 MG tablet Take 2 tablets (650 mg total) by mouth every 6 (six) hours as needed for mild pain (or Fever >/= 101).     albuterol (VENTOLIN HFA) 108 (90 Base) MCG/ACT inhaler Inhale 2 puffs into the lungs every 4 (four) hours as needed for wheezing or shortness of breath. 6.7 g 0   aspirin EC 81 MG tablet Take 1 tablet  (81 mg total) by mouth daily. Swallow whole. 30 tablet 1   atorvastatin (LIPITOR) 40 MG tablet Take 1 tablet (40 mg total) by mouth daily. 30 tablet 1   empagliflozin (JARDIANCE) 10 MG TABS tablet Take 1 tablet (10 mg total) by mouth daily. 30 tablet 0   fluticasone-salmeterol (ADVAIR DISKUS) 250-50 MCG/ACT AEPB Inhale 1 puff into the lungs in the morning and at bedtime. 60 each 0   furosemide (LASIX) 20 MG tablet Take 1 tablet (20 mg total) by mouth daily. 30 tablet 0   OVER THE COUNTER MEDICATION Over the counter pain relief     pantoprazole (PROTONIX) 40 MG tablet Take 1 tablet (40 mg total) by mouth daily.     No facility-administered medications prior to visit.    ROS   Objective:  Physical Exam   There were no vitals filed for this visit.   on RA BMI Readings from Last 3 Encounters:  01/18/22 20.53 kg/m  12/24/21 19.98 kg/m  12/18/21 20.67 kg/m   Wt Readings from Last 3 Encounters:  01/18/22 135 lb (61.2 kg)  12/24/21 131 lb 6.3 oz (59.6 kg)  12/18/21 132 lb (59.9 kg)     CBC    Component Value Date/Time   WBC 6.0 01/26/2022 0246   RBC 3.78 (L) 01/26/2022 0246   HGB 10.4 (L) 01/26/2022 0246   HCT 34.3 (L) 01/26/2022 0246   PLT 108 (L) 01/26/2022 0246   MCV 90.7 01/26/2022 0246   MCH 27.5 01/26/2022 0246   MCHC 30.3 01/26/2022 0246   RDW 23.9 (H) 01/26/2022 0246   LYMPHSABS 0.9 01/26/2022 0246   MONOABS 0.3 01/26/2022 0246   EOSABS 0.0 01/26/2022 0246   BASOSABS 0.0 01/26/2022 0246     Chest Imaging: CTA chest December 2023: Evidence of emphysema on imaging Right middle lobe 4 mm pulmonary nodule. The patient's images have been independently reviewed by me.    Pulmonary Functions Testing Results:     No data to display          FeNO:   Pathology:   Echocardiogram:   Heart Catheterization:     Assessment & Plan:     ICD-10-CM   1. Lung nodule  R91.1     2. Centrilobular emphysema ([place])  J43.2     3. Tobacco abuse  Z72.0        Discussion:  This is a 72 year old gentleman, past medical history of tobacco use, during recent hospitalization had CT imaging of the chest with evidence of centrilobular emphysema and a right middle lobe pulmonary nodule.  Plan: Repeat noncontrasted CT chest in 1 year. Pulmonary function tests for evaluation of structural emphysema seen on CT. Continue current triple therapy inhaler regimen. ***   Current Outpatient Medications:    acetaminophen (TYLENOL) 325 MG tablet, Take 2 tablets (650 mg total) by mouth every 6 (six) hours as needed for mild pain (or Fever >/= 101)., Disp: , Rfl:    albuterol (VENTOLIN HFA) 108 (90 Base) MCG/ACT inhaler, Inhale 2 puffs  into the lungs every 4 (four) hours as needed for wheezing or shortness of breath., Disp: 6.7 g, Rfl: 0   aspirin EC 81 MG tablet, Take 1 tablet (81 mg total) by mouth daily. Swallow whole., Disp: 30 tablet, Rfl: 1   atorvastatin (LIPITOR) 40 MG tablet, Take 1 tablet (40 mg total) by mouth daily., Disp: 30 tablet, Rfl: 1   empagliflozin (JARDIANCE) 10 MG TABS tablet, Take 1 tablet (10 mg total) by mouth daily., Disp: 30 tablet, Rfl: 0   fluticasone-salmeterol (ADVAIR DISKUS) 250-50 MCG/ACT AEPB, Inhale 1 puff into the lungs in the morning and at bedtime., Disp: 60 each, Rfl: 0   furosemide (LASIX) 20 MG tablet, Take 1 tablet (20 mg total) by mouth daily., Disp: 30 tablet, Rfl: 0   OVER THE COUNTER MEDICATION, Over the counter pain relief, Disp: , Rfl:    pantoprazole (PROTONIX) 40 MG tablet, Take 1 tablet (40 mg total) by mouth daily., Disp: , Rfl:   I spent *** minutes dedicated to the care of this patient on the date of this encounter to include pre-visit review of records, face-to-face time with the patient discussing conditions above, post visit ordering of testing, clinical documentation with the electronic health record, making appropriate referrals as documented, and communicating necessary findings to members of the patients  care team.   Garner Nash, DO [facility] 02/27/2022 9:06 AM

## 2023-03-02 IMAGING — DX DG ABDOMEN 2V
2 series · 2 of 2 positions shown · non-contrast
Comparison: CT 08/08/2020.

CLINICAL DATA: Abdominal pain.  Scrotal cellulitis.

EXAM:
ABDOMEN - 2 VIEW

[abdomen supine]
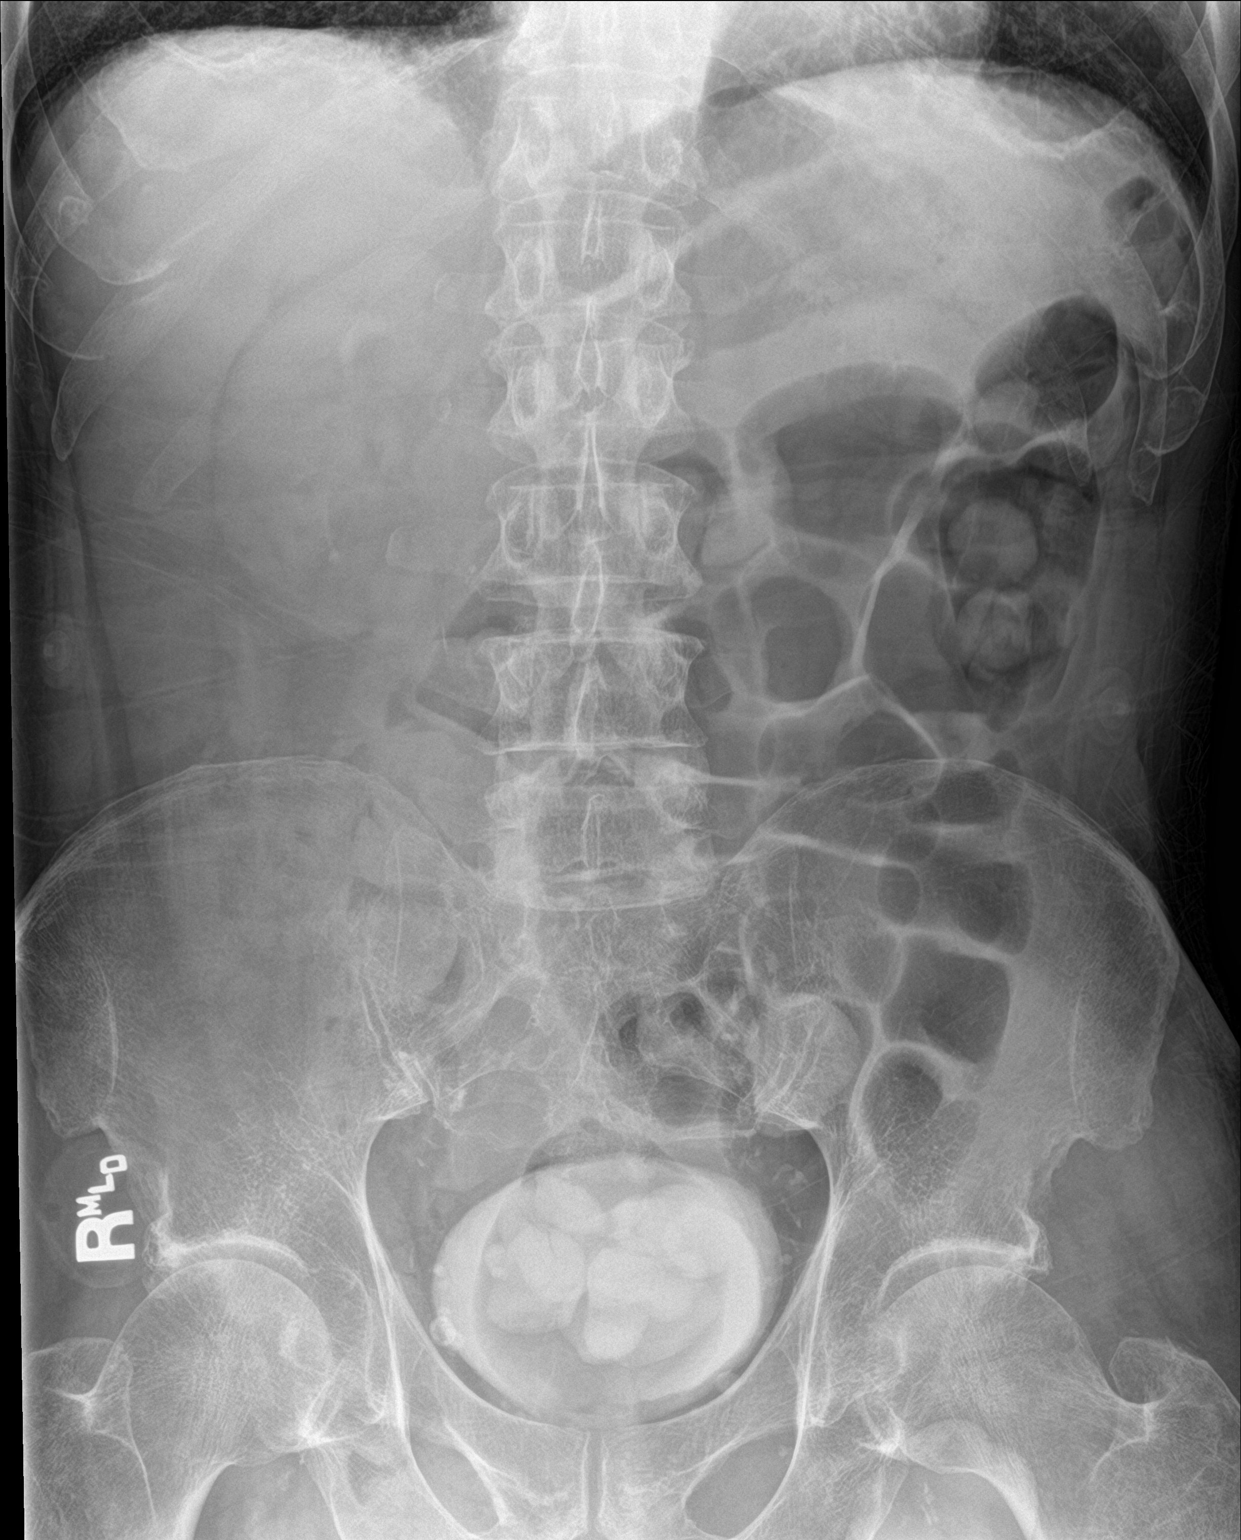

[abdomen decu]
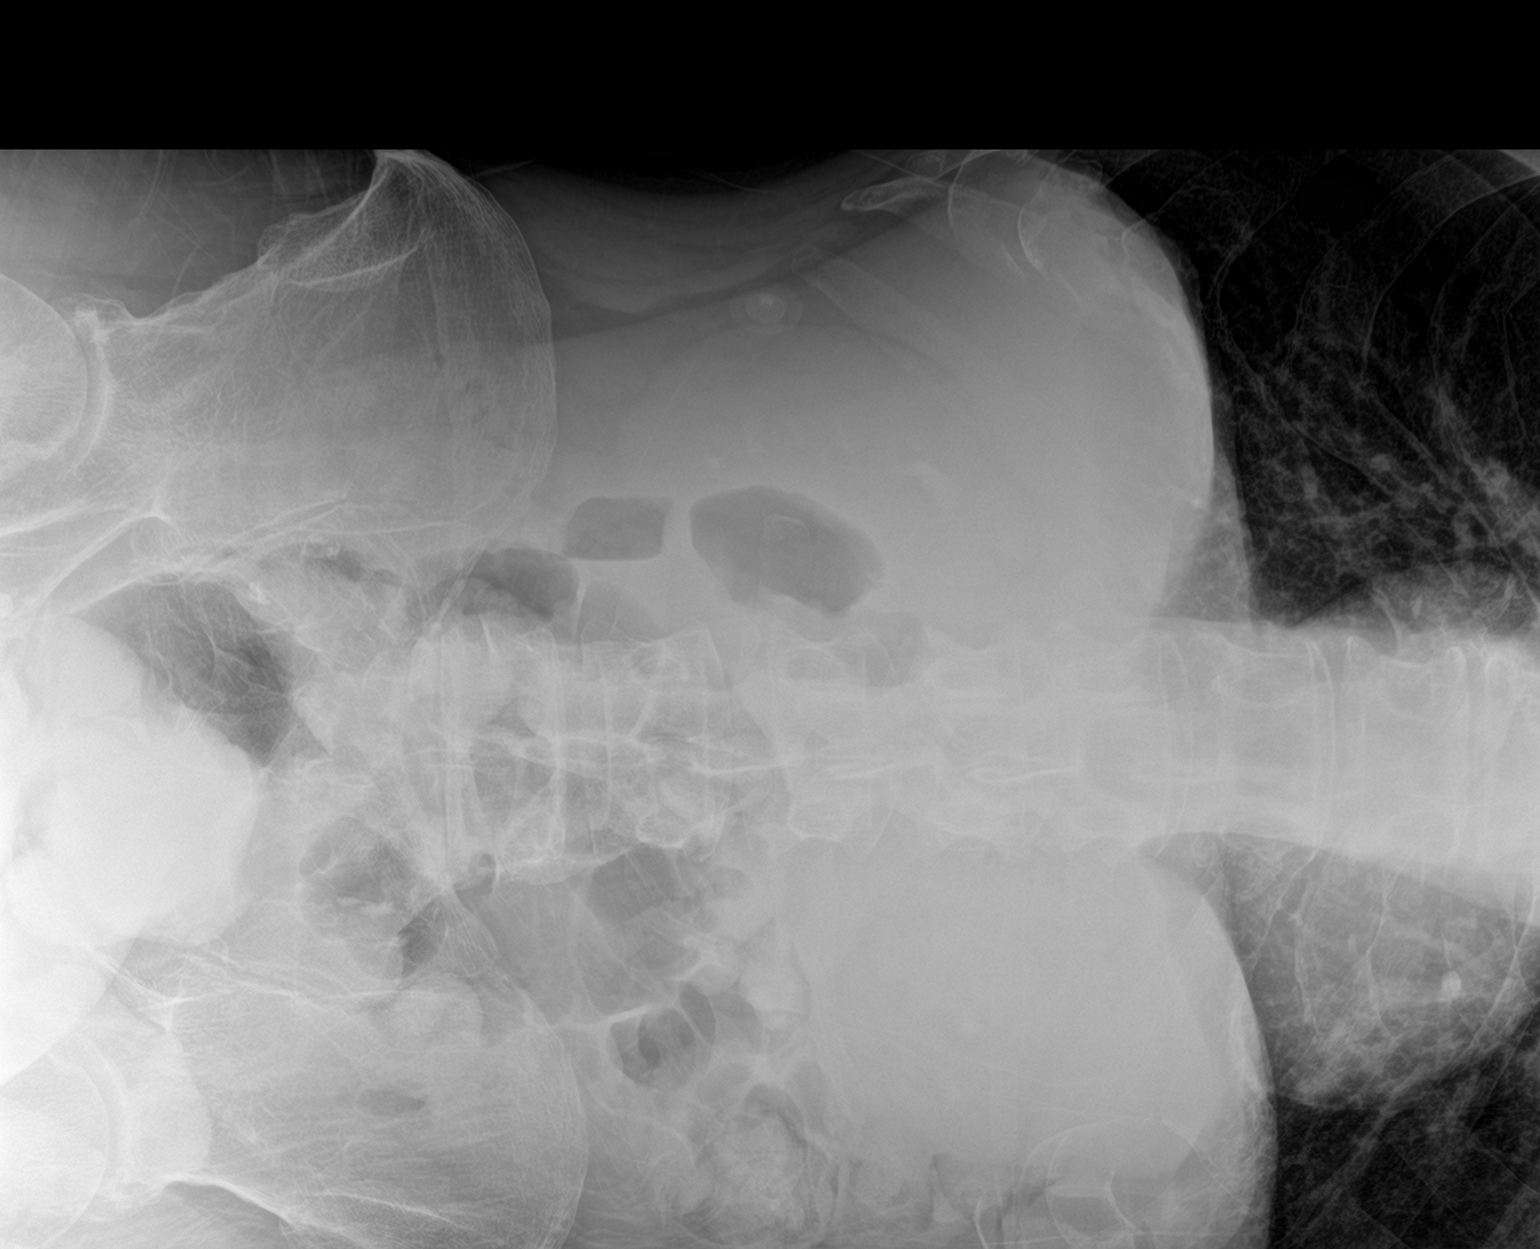

[2 of 2 positions shown; findings below may reference images not displayed]

FINDINGS: Soft tissue structures are unremarkable. No bowel distention noted.
Prominent amount of stool noted in the right colon and rectum. Right
inguinal hernia, best identified by prior CT. Renal scratched it
contrast in the kidneys and bladder. Bladder diverticulum may be
present. Pelvic calcifications consistent phleboliths. Degenerative
changes lumbar spine and both hips.
IMPRESSION: Right inguinal hernia best identified by prior CT. No bowel
distention noted. No free air noted. Prominent amount of stool in
the right colon and rectum.

## 2023-11-20 ENCOUNTER — Inpatient Hospital Stay (HOSPITAL_COMMUNITY)
Admission: EM | Admit: 2023-11-20 | Discharge: 2023-11-26 | DRG: 394 | Disposition: A | Discharge status: Skilled Nursing Facility | Attending: Internal Medicine | Admitting: Internal Medicine

## 2023-11-20 ENCOUNTER — Encounter (HOSPITAL_COMMUNITY): Payer: Self-pay | Admitting: Emergency Medicine

## 2023-11-20 ENCOUNTER — Other Ambulatory Visit: Payer: Self-pay

## 2023-11-20 ENCOUNTER — Emergency Department (HOSPITAL_COMMUNITY)

## 2023-11-20 DIAGNOSIS — I714 Abdominal aortic aneurysm, without rupture, unspecified: Secondary | ICD-10-CM | POA: Diagnosis present

## 2023-11-20 DIAGNOSIS — Z1152 Encounter for screening for COVID-19: Secondary | ICD-10-CM

## 2023-11-20 DIAGNOSIS — K625 Hemorrhage of anus and rectum: Principal | ICD-10-CM | POA: Diagnosis present

## 2023-11-20 DIAGNOSIS — Z85028 Personal history of other malignant neoplasm of stomach: Secondary | ICD-10-CM

## 2023-11-20 DIAGNOSIS — I110 Hypertensive heart disease with heart failure: Secondary | ICD-10-CM | POA: Diagnosis present

## 2023-11-20 DIAGNOSIS — D649 Anemia, unspecified: Secondary | ICD-10-CM | POA: Diagnosis present

## 2023-11-20 DIAGNOSIS — K31819 Angiodysplasia of stomach and duodenum without bleeding: Secondary | ICD-10-CM | POA: Diagnosis present

## 2023-11-20 DIAGNOSIS — K4090 Unilateral inguinal hernia, without obstruction or gangrene, not specified as recurrent: Secondary | ICD-10-CM | POA: Diagnosis not present

## 2023-11-20 DIAGNOSIS — Z7982 Long term (current) use of aspirin: Secondary | ICD-10-CM

## 2023-11-20 DIAGNOSIS — K922 Gastrointestinal hemorrhage, unspecified: Secondary | ICD-10-CM | POA: Diagnosis not present

## 2023-11-20 DIAGNOSIS — R5381 Other malaise: Secondary | ICD-10-CM | POA: Diagnosis present

## 2023-11-20 DIAGNOSIS — E785 Hyperlipidemia, unspecified: Secondary | ICD-10-CM | POA: Diagnosis present

## 2023-11-20 DIAGNOSIS — E871 Hypo-osmolality and hyponatremia: Secondary | ICD-10-CM | POA: Diagnosis present

## 2023-11-20 DIAGNOSIS — Z7984 Long term (current) use of oral hypoglycemic drugs: Secondary | ICD-10-CM

## 2023-11-20 DIAGNOSIS — K2270 Barrett's esophagus without dysplasia: Secondary | ICD-10-CM | POA: Diagnosis present

## 2023-11-20 DIAGNOSIS — Z7951 Long term (current) use of inhaled steroids: Secondary | ICD-10-CM

## 2023-11-20 DIAGNOSIS — D509 Iron deficiency anemia, unspecified: Secondary | ICD-10-CM | POA: Diagnosis present

## 2023-11-20 DIAGNOSIS — Z79899 Other long term (current) drug therapy: Secondary | ICD-10-CM

## 2023-11-20 DIAGNOSIS — K254 Chronic or unspecified gastric ulcer with hemorrhage: Secondary | ICD-10-CM | POA: Diagnosis present

## 2023-11-20 DIAGNOSIS — I7143 Infrarenal abdominal aortic aneurysm, without rupture: Secondary | ICD-10-CM

## 2023-11-20 DIAGNOSIS — L304 Erythema intertrigo: Secondary | ICD-10-CM | POA: Diagnosis present

## 2023-11-20 DIAGNOSIS — F17210 Nicotine dependence, cigarettes, uncomplicated: Secondary | ICD-10-CM | POA: Diagnosis present

## 2023-11-20 DIAGNOSIS — I5042 Chronic combined systolic (congestive) and diastolic (congestive) heart failure: Secondary | ICD-10-CM | POA: Diagnosis present

## 2023-11-20 DIAGNOSIS — N5089 Other specified disorders of the male genital organs: Secondary | ICD-10-CM | POA: Diagnosis present

## 2023-11-20 LAB — COMPREHENSIVE METABOLIC PANEL WITH GFR
ALT: 15 U/L (ref 0–44)
AST: 22 U/L (ref 15–41)
Albumin: 4.2 g/dL (ref 3.5–5.0)
Alkaline Phosphatase: 80 U/L (ref 38–126)
Anion gap: 12 (ref 5–15)
BUN: 11 mg/dL (ref 8–23)
CO2: 24 mmol/L (ref 22–32)
Calcium: 9.8 mg/dL (ref 8.9–10.3)
Chloride: 97 mmol/L — ABNORMAL LOW (ref 98–111)
Creatinine, Ser: 0.72 mg/dL (ref 0.61–1.24)
GFR, Estimated: >60 mL/min (ref >60)
Glucose, Bld: 97 mg/dL (ref 70–99)
Potassium: 4.5 mmol/L (ref 3.5–5.1)
Sodium: 133 mmol/L — ABNORMAL LOW (ref 135–145)
Total Bilirubin: 0.5 mg/dL (ref 0.0–1.2)
Total Protein: 7.8 g/dL (ref 6.5–8.1)

## 2023-11-20 LAB — TYPE AND SCREEN
ABO/RH(D): B POS
Antibody Screen: NEGATIVE

## 2023-11-20 LAB — LIPASE, BLOOD: Lipase: 46 U/L (ref 11–51)

## 2023-11-20 LAB — CBC WITH DIFFERENTIAL/PLATELET
Abs Immature Granulocytes: 0.03 K/uL (ref 0.00–0.07)
Basophils Absolute: 0 K/uL (ref 0.0–0.1)
Basophils Relative: 1 %
Eosinophils Absolute: 0.1 K/uL (ref 0.0–0.5)
Eosinophils Relative: 1 %
HCT: 33.2 % — ABNORMAL LOW (ref 39.0–52.0)
Hemoglobin: 10 g/dL — ABNORMAL LOW (ref 13.0–17.0)
Immature Granulocytes: 1 %
Lymphocytes Relative: 12 %
Lymphs Abs: 0.5 K/uL — ABNORMAL LOW (ref 0.7–4.0)
MCH: 25.3 pg — ABNORMAL LOW (ref 26.0–34.0)
MCHC: 30.1 g/dL (ref 30.0–36.0)
MCV: 83.8 fL (ref 80.0–100.0)
Monocytes Absolute: 0.6 K/uL (ref 0.1–1.0)
Monocytes Relative: 12 %
Neutro Abs: 3.4 K/uL (ref 1.7–7.7)
Neutrophils Relative %: 73 %
Platelets: 195 K/uL (ref 150–400)
RBC: 3.96 MIL/uL — ABNORMAL LOW (ref 4.22–5.81)
RDW: 18.8 % — ABNORMAL HIGH (ref 11.5–15.5)
WBC: 4.6 K/uL (ref 4.0–10.5)
nRBC: 0 % (ref 0.0–0.2)

## 2023-11-20 LAB — PRO BRAIN NATRIURETIC PEPTIDE: Pro Brain Natriuretic Peptide: 94.1 pg/mL (ref <300.0)

## 2023-11-20 LAB — RESP PANEL BY RT-PCR (RSV, FLU A&B, COVID)  RVPGX2
Influenza A by PCR: NEGATIVE
Influenza B by PCR: NEGATIVE
Resp Syncytial Virus by PCR: NEGATIVE
SARS Coronavirus 2 by RT PCR: NEGATIVE

## 2023-11-20 LAB — I-STAT CG4 LACTIC ACID, ED: Lactic Acid, Venous: 1.8 mmol/L (ref 0.5–1.9)

## 2023-11-20 MED ORDER — ACETAMINOPHEN 500 MG PO TABS
1000.0000 mg | ORAL_TABLET | Freq: Once | ORAL | Status: AC
  Administered 2023-11-20: 1000 mg via ORAL
  Filled 2023-11-20: qty 2

## 2023-11-20 MED ORDER — ACETAMINOPHEN 650 MG RE SUPP: 650.0000 mg | Freq: Four times a day (QID) | RECTAL | Status: DC | PRN

## 2023-11-20 MED ORDER — SODIUM CHLORIDE 0.9 % IV SOLN: INTRAVENOUS | Status: AC

## 2023-11-20 MED ORDER — ACETAMINOPHEN 325 MG PO TABS
650.0000 mg | ORAL_TABLET | Freq: Four times a day (QID) | ORAL | Status: DC | PRN
  Administered 2023-11-21 – 2023-11-25 (×5): 650 mg via ORAL
  Filled 2023-11-20 (×5): qty 2

## 2023-11-20 MED ORDER — ONDANSETRON HCL 4 MG PO TABS: 4.0000 mg | ORAL_TABLET | Freq: Four times a day (QID) | ORAL | Status: DC | PRN

## 2023-11-20 MED ORDER — IOHEXOL 300 MG/ML  SOLN
100.0000 mL | Freq: Once | INTRAMUSCULAR | Status: AC | PRN
  Administered 2023-11-20: 100 mL via INTRAVENOUS

## 2023-11-20 MED ORDER — ONDANSETRON HCL 4 MG/2ML IJ SOLN: 4.0000 mg | Freq: Four times a day (QID) | INTRAMUSCULAR | Status: DC | PRN

## 2023-11-20 NOTE — ED Notes (Addendum)
 none

## 2023-11-20 NOTE — ED Triage Notes (Signed)
 Patient is from home and felt a popping sensation this morning from his scrotum and then noticed a he felt wet between his legs as his legs felt stuck together. He has had a hernia for 1.5 years.   EMS vitals: 152/79 BP 96 HR 95% SPO2 on room air

## 2023-11-20 NOTE — Hospital Course (Addendum)
 Greg [name] is a 73 y.o. male with PMH of gastric cancer/Barrett's esophagus, microcytic anemia hyperlipidemia hypertension he had EGD in 2023 with 2 areas of bleeding AVM that were treated with APC, he had echo IN 12/2021 with EF 35-40% presented to the ED after he felt wetness in between legs and had blood in stool. Initially concerned about Fournier's gangrene however afebrile labs without leukocytosis CT scan was done as below no significant finding, Hemoccult was positive type and screen done GI was consulted and admission was requested.  Vascular has been consulted from the ED due to abnormal CTA finding advised admission to [facility]. On my exam patient is a poor historian denies any nausea vomiting chest pain fever chills, no significant scrotal pain, reports his scrotum has been swollen like this for 1.5 years and has foul smell of urine, toenails are long  In the ED: Heart rate up to 90-1 04 BP stable on room air.  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 Recommend follow-up to ensure stability. Large right sided inguinal hernia extending to the scrotal sac containing bowel, grossly stable to prior. No bowel obstruction. Prominent collateral veins overlying the right scrotal sac   Assessment and plan:  GI bleeding Gastric ulcer history: Patient will be admitted continue PPI twice daily check serial H&H type and cross has been done.  He had significant bleeding needing blood transfusion and ICU admission in 2023.  GI has been consulted.  Aneurysmal dilation of abdominal aorta: Per CTextending to common iliac arteries with mural thrombus and severe atherosclerotic calcifications, enlarged to prior exam from 2023.  Vascular consulted from the ED with Dr. Pearline,  admit to [facility] where they will evaluate   Large right sided inguinal hernia Significant scrotal edema: extends to the scrotal sac containing bowel, grossly stable to prior.  No sign of obstruction Check scrotal ultrasound.  Poor historian at baseline  Chronic CHF with reduced EF: Last EF 35-40%.Not sure if he has ever followed up with cardiology will recheck echo while here. Not very clear if he has been following with cardiology. Obtain duplex of the leg. No signs of fluid overload besides some leg edema.  Has normal BNP  DVT prophylaxis:  Code Status:   Code Status: Prior Family Communication: plan of care discussed with patient at bedside. Patient status is: Remains hospitalized because of severity of illness Level of care: Telemetry Cardiac   Dispo: The patient is from: home            Anticipated disposition: TBD Objective: Vitals last 24 hrs: Vitals:   11/20/23 1303 11/20/23 1615 11/20/23 1641 11/20/23 1700  BP: (!) 143/92 138/84 (!) 162/100 134/86  Pulse: 97 92 (!) 104   Resp: 16 20 18  (!) 21  Temp: 98.3 F (36.8 C)  99.6 F (37.6 C)   TempSrc: Oral  Oral   SpO2: 100% 99% 97%     Physical Examination: General exam: alert awake, ill-appearing  HEENT:Oral mucosa moist, Ear/Nose WNL grossly Respiratory system: Bilaterally clear BS,no use of accessory muscle Cardiovascular system: S1 & S2 +, No JVD. Gastrointestinal system: Abdomen soft,NT,ND, BS+ significant scrotal edema mild skin lesions on mild erythema without significant tenderness, no crepitus. Nervous System: Alert, awake, moving all extremities,and following commands. Extremities: extremities warm, leg edema ++ with long toe nails  Skin: No rashes,no icterus. MSK: Normal muscle bulk,tone, power   Medications reviewed:  Scheduled Meds: Continuous Infusions: Diet: Diet Order     None

## 2023-11-20 NOTE — H&P (Signed)
 History and Physical    Greg [name] FMW:[id number] DOB: 17-Aug-1950 DOA: 11/20/2023  PCP: Pcp, No   Patient coming from: Home  Chief complaint: Wetness in between the legs and concern for rectal bleeding  HPI: Greg [name] is a 73 y.o. male with PMH of gastric cancer/Barrett's esophagus, microcytic anemia hyperlipidemia hypertension he had EGD in 2023 with 2 areas of bleeding AVM that were treated with APC, he had echo IN 12/2021 with EF 35-40% presented to the ED after he felt wetness in between legs and had blood in stool. Initially concerned about Fournier's gangrene however afebrile labs without leukocytosis CT scan was done as below no significant finding, Hemoccult was positive type and screen done GI was consulted and admission was requested.  Vascular has been consulted from the ED due to abnormal CTA finding advised admission to [facility]. On my exam patient is a poor historian denies any nausea vomiting chest pain fever chills, no significant scrotal pain, reports his scrotum has been swollen like this for 1.5 years and has foul smell of urine, toenails are long  In the ED: Heart rate up to 90-1 04 BP stable on room air.  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 Recommend follow-up to ensure stability. Large right sided inguinal hernia extending to the scrotal sac containing bowel, grossly stable to prior. No bowel obstruction. Prominent collateral veins overlying the right scrotal sac   Assessment and plan:  GI bleeding Gastric ulcer history: Patient will be admitted continue PPI twice daily check serial H&H type and cross has been done.  He had significant bleeding needing blood transfusion and ICU admission in 2023.  GI has been  consulted.  Aneurysmal dilation of abdominal aorta: Per CTextending to common iliac arteries with mural thrombus and severe atherosclerotic calcifications, enlarged to prior exam from 2023.  Vascular consulted from the ED with Dr. Pearline, admit to [facility] where they will evaluate   Large right sided inguinal hernia Significant scrotal edema: extends to the scrotal sac containing bowel, grossly stable to prior.  No sign of obstruction Check scrotal ultrasound.  Poor historian at baseline  Chronic CHF with reduced EF: Last EF 35-40%.Not sure if he has ever followed up with cardiology will recheck echo while here. Not very clear if he has been following with cardiology. Obtain duplex of the leg. No signs of fluid overload besides some leg edema.  Has normal BNP  DVT prophylaxis:  Code Status:   Code Status: Prior Family Communication: plan of care discussed with patient at bedside. Patient status is: Remains hospitalized because of severity of illness Level of care: Telemetry Cardiac   Dispo: The patient is from: home            Anticipated disposition: TBD Objective: Vitals last 24 hrs: Vitals:   11/20/23 1303 11/20/23 1615 11/20/23 1641 11/20/23 1700  BP: (!) 143/92 138/84 (!) 162/100 134/86  Pulse: 97 92 (!) 104   Resp: 16 20 18  (!) 21  Temp: 98.3 F (36.8 C)  99.6 F (37.6 C)   TempSrc: Oral  Oral   SpO2: 100% 99% 97%     Physical Examination: General exam: alert awake, ill-appearing  HEENT:Oral mucosa moist, Ear/Nose WNL grossly Respiratory system: Bilaterally clear BS,no use of accessory muscle Cardiovascular system: S1 & S2 +, No JVD.  Gastrointestinal system: Abdomen soft,NT,ND, BS+ significant scrotal edema mild skin lesions on mild erythema without significant tenderness, no crepitus. Nervous System: Alert, awake, moving all extremities,and following commands. Extremities: extremities warm, leg edema ++ with long toe nails Skin: No rashes,no icterus. MSK: Normal  muscle bulk,tone, power   Medications reviewed:  Scheduled Meds: Continuous Infusions: Diet: Diet Order     None         Severity of Illness: The appropriate patient status for this patient is OBSERVATION. Observation status is judged to be reasonable and necessary in order to provide the required intensity of service to ensure the patient's safety. The patient's presenting symptoms, physical exam findings, and initial radiographic and laboratory data in the context of their medical condition is felt to place them at decreased risk for further clinical deterioration. Furthermore, it is anticipated that the patient will be medically stable for discharge from the hospital within 2 midnights of admission.   Family Communication: Admission, patients condition and plan of care including tests being ordered have been discussed with the patient who indicate understanding and agree with the plan and Code Status.  Consults called:  GI VVS  Review of Systems: All systems were reviewed and were negative except as mentioned in HPI above. Negative for fever Negative for chest pain Negative for shortness of breath  Past Medical History:  Diagnosis Date   Barrett's esophagus    Gastric ulcer     Past Surgical History:  Procedure Laterality Date   BIOPSY  12/20/2021   Procedure: BIOPSY;  Surgeon: Eda Iha, MD;  Location: [facility] ENDOSCOPY;  Service: Gastroenterology;;   COLONOSCOPY WITH PROPOFOL  N/A 01/22/2022   Procedure: COLONOSCOPY WITH PROPOFOL ;  Surgeon: Leigh Elspeth SQUIBB, MD;  Location: [facility] ENDOSCOPY;  Service: Gastroenterology;  Laterality: N/A;   ESOPHAGOGASTRODUODENOSCOPY (EGD) WITH PROPOFOL  N/A 12/20/2021   Procedure: ESOPHAGOGASTRODUODENOSCOPY (EGD) WITH PROPOFOL ;  Surgeon: Eda Iha, MD;  Location: [facility] ENDOSCOPY;  Service: Gastroenterology;  Laterality: N/A;   ESOPHAGOGASTRODUODENOSCOPY (EGD) WITH PROPOFOL  N/A 01/19/2022   Procedure: ESOPHAGOGASTRODUODENOSCOPY (EGD)  WITH PROPOFOL ;  Surgeon: Shila Gustav GAILS, MD;  Location: [facility] ENDOSCOPY;  Service: Gastroenterology;  Laterality: N/A;   GIVENS CAPSULE STUDY N/A 01/22/2022   Procedure: GIVENS CAPSULE STUDY;  Surgeon: Leigh Elspeth SQUIBB, MD;  Location: [facility] ENDOSCOPY;  Service: Gastroenterology;  Laterality: N/A;   HOT HEMOSTASIS N/A 01/19/2022   Procedure: HOT HEMOSTASIS (ARGON PLASMA COAGULATION/BICAP);  Surgeon: Nandigam, Kavitha V, MD;  Location: [facility] ENDOSCOPY;  Service: Gastroenterology;  Laterality: N/A;   POLYPECTOMY  01/22/2022   Procedure: POLYPECTOMY;  Surgeon: Leigh Elspeth SQUIBB, MD;  Location: [facility] ENDOSCOPY;  Service: Gastroenterology;;     reports that he has been smoking cigarettes. He has never used smokeless tobacco. He reports that he does not currently use alcohol. He reports current drug use. Drug: Marijuana.  No Known Allergies  Family History  Problem Relation Age of Onset   Stomach cancer Neg Hx    Colon cancer Neg Hx    Esophageal cancer Neg Hx    Pancreatic cancer Neg Hx      Prior to Admission medications   Medication Sig Start Date End Date Taking? Authorizing Provider  acetaminophen  (TYLENOL ) 325 MG tablet Take 2 tablets (650 mg total) by mouth every 6 (six) hours as needed for mild pain (or Fever >/= 101). 12/24/21   Sebastian Toribio GAILS, MD  albuterol  (VENTOLIN  HFA) 108 [phone] Base) MCG/ACT inhaler Inhale 2 puffs into the lungs every 4 (four) hours as needed for wheezing or shortness  of breath. 01/25/22   Cheryle Page, MD  aspirin  EC 81 MG tablet Take 1 tablet (81 mg total) by mouth daily. Swallow whole. 12/25/21   Sebastian Toribio GAILS, MD  atorvastatin  (LIPITOR) 40 MG tablet Take 1 tablet (40 mg total) by mouth daily. 12/25/21   Sebastian Toribio GAILS, MD  empagliflozin  (JARDIANCE ) 10 MG TABS tablet Take 1 tablet (10 mg total) by mouth daily. 01/26/22   Cheryle Page, MD  fluticasone -salmeterol (ADVAIR  DISKUS) 250-50 MCG/ACT AEPB Inhale 1 puff into the lungs in the morning and at  bedtime. 01/25/22   Cheryle Page, MD  furosemide  (LASIX ) 20 MG tablet Take 1 tablet (20 mg total) by mouth daily. 01/26/22 02/25/22  Cheryle Page, MD  OVER THE COUNTER MEDICATION Over the counter pain relief    [provider]  pantoprazole  (PROTONIX ) 40 MG tablet Take 1 tablet (40 mg total) by mouth daily. 01/25/22   Cheryle Page, MD   r   Labs on Admission: I have personally reviewed following labs and imaging studies  CBC: Recent Labs  Lab 11/20/23 1404  WBC 4.6  NEUTROABS 3.4  HGB 10.0*  HCT 33.2*  MCV 83.8  PLT 195   Basic Metabolic Panel: Recent Labs  Lab 11/20/23 1404  NA 133*  K 4.5  CL 97*  CO2 24  GLUCOSE 97  BUN 11  CREATININE 0.72  CALCIUM  9.8   CrCl cannot be calculated (Unknown ideal weight.). Recent Labs  Lab 11/20/23 1404  AST 22  ALT 15  ALKPHOS 80  BILITOT 0.5  PROT 7.8  ALBUMIN 4.2   Recent Labs  Lab 11/20/23 1404  LIPASE 46   No results for input(s): AMMONIA in the last 168 hours. Coagulation Profile: No results for input(s): INR, PROTIME in the last 168 hours. Cardiac Panel (last 3 results) No results for input(s): CKTOTAL, CKMB, TROPONINIHS, RELINDX in the last 72 hours.  BNP (last 3 results) Recent Labs    11/20/23 1404  PROBNP 94.1  Urine analysis:    Component Value Date/Time   COLORURINE STRAW (A) 12/18/2021 1945   APPEARANCEUR CLEAR 12/18/2021 1945   LABSPEC 1.014 12/18/2021 1945   PHURINE 6.0 12/18/2021 1945   GLUCOSEU NEGATIVE 12/18/2021 1945   HGBUR NEGATIVE 12/18/2021 1945   BILIRUBINUR NEGATIVE 12/18/2021 1945   KETONESUR NEGATIVE 12/18/2021 1945   PROTEINUR NEGATIVE 12/18/2021 1945   NITRITE NEGATIVE 12/18/2021 1945   LEUKOCYTESUR NEGATIVE 12/18/2021 1945    Radiological Exams on Admission: CT ABDOMEN PELVIS W CONTRAST Result Date: 11/20/2023 CLINICAL DATA:  Sepsis EXAM: CT ABDOMEN AND PELVIS WITH CONTRAST TECHNIQUE: Multidetector CT imaging of the abdomen and pelvis was performed  using the standard protocol following bolus administration of intravenous contrast. RADIATION DOSE REDUCTION: This exam was performed according to the departmental dose-optimization program which includes automated exposure control, adjustment of the mA and/or kV according to patient size and/or use of iterative reconstruction technique. CONTRAST:  OMNIPAQUE  IOHEXOL  300 MG/ML  SOLN COMPARISON:  CT angio chest abdomen pelvis December 18, 2021 FINDINGS: Lower chest: No acute abnormality. Hepatobiliary: No focal liver abnormality is seen. No gallstones, gallbladder wall thickening, or biliary dilatation. Pancreas: Unremarkable. No pancreatic ductal dilatation or surrounding inflammatory changes. Spleen: Normal in size without focal abnormality. Adrenals/Urinary Tract: Adrenal glands are unremarkable. Kidneys are normal, without renal calculi, focal lesion, or hydronephrosis. Bladder is unremarkable. Stomach/Bowel: Stomach is within normal limits. Appendix appears normal. See below for malposition of right hemicolon within the scrotal sac hernia. Large stool throughout the rectum.  Otherwise no evidence of bowel wall thickening, distention, or inflammatory changes. Vascular/Lymphatic: Atherosclerotic calcifications of aorta and branches with severe tortuosity. 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 No enlarged abdominal or pelvic lymph nodes. Bilateral prominent inguinal lymph nodes measuring up to 11 mm. Reproductive: Large right inguinal hernia scrotal sac containing colon (right hemicolon and small-bowel) and fat measuring approximately 16 x 12 cm. No bowel obstruction identified.  Superficial collateral vessels are identified overlying the right scrotal sac without active bleeding. Small hydrocele.  Moderate prostatomegaly. Other: No abdominopelvic ascites. Musculoskeletal: No acute or significant osseous findings. Degenerative changes of the spine. 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 Recommend follow-up to ensure stability. Large right sided inguinal hernia extending to the scrotal sac containing bowel, grossly stable to prior. No bowel obstruction. Prominent collateral veins overlying the right scrotal sac. Electronically Signed   By: Megan  Zare M.D.   On: 11/20/2023 17:16   Mennie LAMY MD [facility]  If 7PM-7AM, please contact night-coverage [email or web address]  11/20/2023, 7:27 PM

## 2023-11-20 NOTE — ED Provider Notes (Signed)
 4:08 PM Assumed care of patient from off-going team. For more details, please see note from same day.  In brief, this is a 73 y.o. male with swollen scrotum and felt a wet sensation. Had defecated and noted some blood. Thought could be blood from scrotum. Hemoccult was positive. Hgb 10 which is at his baseline, no further bleeding since he's been here. Has history of GIB, AVM. Not on blood thinner.   Plan/Dispo at time of sign-out & ED Course since sign-out: [ ]  CT abd pelvis  BP (!) 143/92 (BP Location: Left Arm)   Pulse 97   Temp 98.3 F (36.8 C) (Oral)   Resp 16   SpO2 100%    ED Course:   Clinical Course as of 11/20/23 1900  Thu Nov 20, 2023  1424 Hemoglobin(!): 10.0 Stable hemoglobin.  His Hemoccult was positive however [TY]  1507 WBC: 4.6 No leukocytosis to suggest systemic infection. [TY]  1507 Comprehensive metabolic panel(!) Minor low sodium and chloride.  Normal renal function.  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 Recommend follow-up to ensure stability.  Large right sided inguinal hernia extending to the scrotal sac containing bowel, grossly stable to prior. No bowel obstruction. Prominent collateral veins overlying the right scrotal sac.   [HN]  1846 Talked w/ GI Dr. Elicia who will see patient in the AM.  [HN]  1851 D/w Dr. CHRISTOBAL who recommends vascular consult to help knowing whether to admit to [facility] or [facility]. Consulted to vascular surgery. [HN]  1859 D/w vascular team who recommend admission at [facility]. Dr. Pearline will look at patient's images when he  is out of the OR. Dr. CHRISTOBAL will admit to [place]. [HN]    Clinical Course User Index [HN] Franklyn Sid SAILOR, MD [TY] Neysa Caron PARAS, DO    Dispo: Admit to [place] with GI and vascular following  ------------------------------- Sid Franklyn, MD Emergency Medicine  This note was created using dictation software, which may contain spelling or grammatical errors.  CRITICAL CARE Performed by: Sid SAILOR Franklyn Total critical care time: 30 minutes Critical care time was exclusive of separately billable procedures and treating other patients. Critical care was necessary to treat or prevent imminent or life-threatening deterioration. Critical care was time spent personally by me on the following activities: development of treatment plan with patient and/or surrogate as well as nursing, discussions with consultants, evaluation of patient's response to treatment, examination of patient, obtaining history from patient or surrogate, ordering and performing treatments and interventions, ordering and review of laboratory studies, ordering and review of radiographic studies, pulse oximetry and re-evaluation of patient's condition.    Franklyn Sid SAILOR, MD 11/20/23 1900

## 2023-11-20 NOTE — ED Provider Notes (Signed)
 [place] EMERGENCY DEPARTMENT AT [facility] Provider Note   CSN: [id number] Arrival date & time: 11/20/23  1255     Patient presents with: No chief complaint on file.   Greg [name] is a 73 y.o. male.   73 year old male presenting emergency department with bleeding.  Reports felt his scrotum sticking between his legs and then wet sensation.  Defecated on himself and noticed blood.  He thought it could be coming from his scrotum.  Denies fevers chills.  He has a poor historian.  Denies abdominal pain.  No nausea no vomiting.  He notes scrotal edema for the past several months        Prior to Admission medications   Medication Sig Start Date End Date Taking? Authorizing Provider  acetaminophen  (TYLENOL ) 325 MG tablet Take 2 tablets (650 mg total) by mouth every 6 (six) hours as needed for mild pain (or Fever >/= 101). 12/24/21   Sebastian Toribio GAILS, MD  albuterol  (VENTOLIN  HFA) 108 [id number] Base) MCG/ACT inhaler Inhale 2 puffs into the lungs every 4 (four) hours as needed for wheezing or shortness of breath. 01/25/22   Cheryle Page, MD  aspirin  EC 81 MG tablet Take 1 tablet (81 mg total) by mouth daily. Swallow whole. 12/25/21   Sebastian Toribio GAILS, MD  atorvastatin  (LIPITOR) 40 MG tablet Take 1 tablet (40 mg total) by mouth daily. 12/25/21   Sebastian Toribio GAILS, MD  empagliflozin  (JARDIANCE ) 10 MG TABS tablet Take 1 tablet (10 mg total) by mouth daily. 01/26/22   Cheryle Page, MD  fluticasone -salmeterol (ADVAIR  DISKUS) 250-50 MCG/ACT AEPB Inhale 1 puff into the lungs in the morning and at bedtime. 01/25/22   Cheryle Page, MD  furosemide  (LASIX ) 20 MG tablet Take 1 tablet (20 mg total) by mouth daily. 01/26/22 02/25/22  Cheryle Page, MD  OVER THE COUNTER MEDICATION Over the counter pain relief    [provider]  pantoprazole  (PROTONIX ) 40 MG tablet Take 1 tablet (40 mg total) by mouth daily. 01/25/22   Cheryle Page, MD    Allergies: Patient has no known  allergies.    Review of Systems  Updated Vital Signs BP (!) 143/92 (BP Location: Left Arm)   Pulse 97   Temp 98.3 F (36.8 C) (Oral)   Resp 16   SpO2 100%   Physical Exam Vitals and nursing note reviewed.  HENT:     Head: Normocephalic.     Nose: Nose normal.     Mouth/Throat:     Mouth: Mucous membranes are moist.  Eyes:     Conjunctiva/sclera: Conjunctivae normal.  Cardiovascular:     Rate and Rhythm: Normal rate and regular rhythm.  Pulmonary:     Effort: Pulmonary effort is normal.     Breath sounds: Normal breath sounds.  Abdominal:     General: Abdomen is flat. There is no distension.     Palpations: Abdomen is soft.     Tenderness: There is no abdominal tenderness. There is no guarding or rebound.  Genitourinary:    Comments: Significant scrotal edema present.  Patient had defecated on himself.  Does have some excoriation to posterior scrotum.  No crepitus or bulla.  Hemoccult positive for blood Musculoskeletal:     Right lower leg: No edema.     Left lower leg: No edema.  Skin:    General: Skin is warm.     Capillary Refill: Capillary refill takes less than 2 seconds.  Neurological:     Mental Status: He  is alert and oriented to person, place, and time.  Psychiatric:        Mood and Affect: Mood normal.        Behavior: Behavior normal.     (all labs ordered are listed, but only abnormal results are displayed) Labs Reviewed  CBC WITH DIFFERENTIAL/PLATELET - Abnormal; Notable for the following components:      Result Value   RBC 3.96 (*)    Hemoglobin 10.0 (*)    HCT 33.2 (*)    MCH 25.3 (*)    RDW 18.8 (*)    Lymphs Abs 0.5 (*)    All other components within normal limits  COMPREHENSIVE METABOLIC PANEL WITH GFR - Abnormal; Notable for the following components:   Sodium 133 (*)    Chloride 97 (*)    All other components within normal limits  LIPASE, BLOOD  PRO BRAIN NATRIURETIC PEPTIDE  I-STAT CG4 LACTIC ACID, ED  POC OCCULT BLOOD, ED  TYPE AND  SCREEN    EKG: EKG Interpretation Date/Time:  Thursday November 20 2023 14:29:23 EDT Ventricular Rate:  97 PR Interval:    QRS Duration:  86 QT Interval:  314 QTC Calculation: 399 R Axis:   79  Text Interpretation: Atrial flutter with varied AV block, Anteroseptal infarct, age indeterminate Confirmed by Neysa Clap [phone]) on 11/20/2023 2:42:28 PM  Radiology: No results found.   Procedures   Medications Ordered in the ED  iohexol  (OMNIPAQUE ) 300 MG/ML solution 100 mL (100 mLs Intravenous Contrast Given 11/20/23 1510)    Clinical Course as of 11/20/23 1550  Thu Nov 20, 2023  1424 Hemoglobin(!): 10.0 Stable hemoglobin.  His Hemoccult was positive however [TY]  1507 WBC: 4.6 No leukocytosis to suggest systemic infection. [TY]  1507 Comprehensive metabolic panel(!) Minor low sodium and chloride.  Normal renal function.  No transaminitis to suggest hepatobiliary disease [TY]  1507 Lipase: 46 Pancreatitis unlikely [TY]  1507 Pro Brain Natriuretic Peptide: 94.1 Overt heart failure unlikely [TY]  1508 Lactic Acid, Venous: 1.8 Sepsis/systemic infection less likely [TY]    Clinical Course User Index [TY] Neysa Clap PARAS, DO                                 Medical Decision Making Is a 73 year old male with history of gastric ulcer/Barrett's esophagus who presented to the emergency department for bleeding, thinks it may be from his scrotum versus diarrhea.  EMS reported stable vitals and route.  Patient was covered in feces, Hemoccult positive.  Does have significant scrotal edema with some erythema and excoriation to scrotum, but does not appear to have active bleeding at this time.  Do not appreciate crepitus or bulla.  Per chart review does have a history of GI bleed in 2023.  Will get screening labs, given his scrotal edema with erythema and excoriations will get screening sepsis labs and CT scan to evaluate for possible Fournier's gangrene although less likely given no fever or  tachycardia.  He is Hemoccult is positive.  Will get type and screen as well.  See ED course for further MDM and disposition.  Care signed out to afternoon team, disposition pending CT scan.  Amount and/or Complexity of Data Reviewed Labs: ordered. Decision-making details documented in ED Course. Radiology: ordered. ECG/medicine tests: ordered.  Risk Prescription drug management.      Final diagnoses:  None    ED Discharge Orders     None  Neysa Caron PARAS, DO 11/20/23 1550

## 2023-11-20 NOTE — Plan of Care (Signed)
 Vascular Surgery Plan of Care  Reviewed the CT.  Slight increase in growth of both the AAA and right common iliac artery aneurysm.  No signs of impending rupture. In 2023 the AAA measured approximately 3.2 cm and the right common iliac measured 3.1 cm. Today his AAA measures approximately 3.8 cm in the right common iliac measures 3.4 cm.  This is a normal amount of growth over the last 2 years and would not be the cause of his abdominal pain or his GI bleeding.  Will plan for a 3-month follow-up with a repeat CTA abdomen pelvis in the office.  Norman GORMAN Serve MD [facility] Phone Number: [phone] 11/20/2023 8:09 PM

## 2023-11-21 ENCOUNTER — Observation Stay (HOSPITAL_COMMUNITY)

## 2023-11-21 ENCOUNTER — Telehealth: Payer: Self-pay | Admitting: Pharmacy Technician

## 2023-11-21 ENCOUNTER — Telehealth (HOSPITAL_COMMUNITY): Payer: Self-pay | Admitting: Internal Medicine

## 2023-11-21 ENCOUNTER — Inpatient Hospital Stay (HOSPITAL_COMMUNITY)

## 2023-11-21 DIAGNOSIS — I5022 Chronic systolic (congestive) heart failure: Secondary | ICD-10-CM

## 2023-11-21 DIAGNOSIS — Z79899 Other long term (current) drug therapy: Secondary | ICD-10-CM | POA: Diagnosis not present

## 2023-11-21 DIAGNOSIS — E871 Hypo-osmolality and hyponatremia: Secondary | ICD-10-CM | POA: Diagnosis present

## 2023-11-21 DIAGNOSIS — K31819 Angiodysplasia of stomach and duodenum without bleeding: Secondary | ICD-10-CM

## 2023-11-21 DIAGNOSIS — M7989 Other specified soft tissue disorders: Secondary | ICD-10-CM

## 2023-11-21 DIAGNOSIS — I5042 Chronic combined systolic (congestive) and diastolic (congestive) heart failure: Secondary | ICD-10-CM | POA: Diagnosis present

## 2023-11-21 DIAGNOSIS — E785 Hyperlipidemia, unspecified: Secondary | ICD-10-CM | POA: Diagnosis present

## 2023-11-21 DIAGNOSIS — Z7984 Long term (current) use of oral hypoglycemic drugs: Secondary | ICD-10-CM | POA: Diagnosis not present

## 2023-11-21 DIAGNOSIS — I110 Hypertensive heart disease with heart failure: Secondary | ICD-10-CM | POA: Diagnosis present

## 2023-11-21 DIAGNOSIS — Z7982 Long term (current) use of aspirin: Secondary | ICD-10-CM | POA: Diagnosis not present

## 2023-11-21 DIAGNOSIS — K625 Hemorrhage of anus and rectum: Secondary | ICD-10-CM | POA: Diagnosis not present

## 2023-11-21 DIAGNOSIS — D509 Iron deficiency anemia, unspecified: Secondary | ICD-10-CM | POA: Insufficient documentation

## 2023-11-21 DIAGNOSIS — N5089 Other specified disorders of the male genital organs: Secondary | ICD-10-CM | POA: Diagnosis present

## 2023-11-21 DIAGNOSIS — Z7951 Long term (current) use of inhaled steroids: Secondary | ICD-10-CM | POA: Diagnosis not present

## 2023-11-21 DIAGNOSIS — K4090 Unilateral inguinal hernia, without obstruction or gangrene, not specified as recurrent: Secondary | ICD-10-CM

## 2023-11-21 DIAGNOSIS — Z1152 Encounter for screening for COVID-19: Secondary | ICD-10-CM | POA: Diagnosis not present

## 2023-11-21 DIAGNOSIS — Z8719 Personal history of other diseases of the digestive system: Secondary | ICD-10-CM

## 2023-11-21 DIAGNOSIS — I714 Abdominal aortic aneurysm, without rupture, unspecified: Secondary | ICD-10-CM | POA: Diagnosis present

## 2023-11-21 DIAGNOSIS — L304 Erythema intertrigo: Secondary | ICD-10-CM | POA: Diagnosis present

## 2023-11-21 DIAGNOSIS — F17210 Nicotine dependence, cigarettes, uncomplicated: Secondary | ICD-10-CM | POA: Diagnosis present

## 2023-11-21 DIAGNOSIS — K2270 Barrett's esophagus without dysplasia: Secondary | ICD-10-CM | POA: Diagnosis present

## 2023-11-21 DIAGNOSIS — D508 Other iron deficiency anemias: Secondary | ICD-10-CM | POA: Diagnosis not present

## 2023-11-21 DIAGNOSIS — K922 Gastrointestinal hemorrhage, unspecified: Secondary | ICD-10-CM | POA: Diagnosis not present

## 2023-11-21 DIAGNOSIS — Z85028 Personal history of other malignant neoplasm of stomach: Secondary | ICD-10-CM | POA: Diagnosis not present

## 2023-11-21 DIAGNOSIS — R5381 Other malaise: Secondary | ICD-10-CM | POA: Diagnosis present

## 2023-11-21 LAB — BASIC METABOLIC PANEL WITH GFR
Anion gap: 11 (ref 5–15)
BUN: 8 mg/dL (ref 8–23)
CO2: 24 mmol/L (ref 22–32)
Calcium: 9.1 mg/dL (ref 8.9–10.3)
Chloride: 98 mmol/L (ref 98–111)
Creatinine, Ser: 0.66 mg/dL (ref 0.61–1.24)
GFR, Estimated: >60 mL/min (ref >60)
Glucose, Bld: 86 mg/dL (ref 70–99)
Potassium: 3.9 mmol/L (ref 3.5–5.1)
Sodium: 133 mmol/L — ABNORMAL LOW (ref 135–145)

## 2023-11-21 LAB — TSH: TSH: 1.34 u[IU]/mL (ref 0.350–4.500)

## 2023-11-21 LAB — CBC
HCT: 29.4 % — ABNORMAL LOW (ref 39.0–52.0)
Hemoglobin: 8.8 g/dL — ABNORMAL LOW (ref 13.0–17.0)
MCH: 25.1 pg — ABNORMAL LOW (ref 26.0–34.0)
MCHC: 29.9 g/dL — ABNORMAL LOW (ref 30.0–36.0)
MCV: 84 fL (ref 80.0–100.0)
Platelets: 190 K/uL (ref 150–400)
RBC: 3.5 MIL/uL — ABNORMAL LOW (ref 4.22–5.81)
RDW: 18.8 % — ABNORMAL HIGH (ref 11.5–15.5)
WBC: 3.4 K/uL — ABNORMAL LOW (ref 4.0–10.5)
nRBC: 0 % (ref 0.0–0.2)

## 2023-11-21 LAB — IRON AND TIBC
Iron: 16 ug/dL — ABNORMAL LOW (ref 45–182)
Saturation Ratios: 4 % — ABNORMAL LOW (ref 17.9–39.5)
TIBC: 417 ug/dL (ref 250–450)
UIBC: 401 ug/dL

## 2023-11-21 LAB — ECHOCARDIOGRAM COMPLETE
Area-P 1/2: 4.49 cm2
Calc EF: 58.4 %
Height: 68 in
S' Lateral: 2.4 cm
Single Plane A2C EF: 55.2 %
Single Plane A4C EF: 61.5 %
Weight: 1756.8 [oz_av]

## 2023-11-21 LAB — RETICULOCYTES
Immature Retic Fract: 29.8 % — ABNORMAL HIGH (ref 2.3–15.9)
RBC.: 3.48 MIL/uL — ABNORMAL LOW (ref 4.22–5.81)
Retic Count, Absolute: 67.5 K/uL (ref 19.0–186.0)
Retic Ct Pct: 1.9 % (ref 0.4–3.1)

## 2023-11-21 LAB — HEMOGLOBIN AND HEMATOCRIT, BLOOD
HCT: 29.9 % — ABNORMAL LOW (ref 39.0–52.0)
HCT: 27.7 % — ABNORMAL LOW (ref 39.0–52.0)
Hemoglobin: 8.8 g/dL — ABNORMAL LOW (ref 13.0–17.0)
Hemoglobin: 8.4 g/dL — ABNORMAL LOW (ref 13.0–17.0)

## 2023-11-21 LAB — FOLATE: Folate: 14.4 ng/mL (ref >5.9)

## 2023-11-21 LAB — VITAMIN B12: Vitamin B-12: 735 pg/mL (ref 180–914)

## 2023-11-21 LAB — FERRITIN: Ferritin: 56 ng/mL (ref 24–336)

## 2023-11-21 MED ORDER — ORAL CARE MOUTH RINSE: 15.0000 mL | OROMUCOSAL | Status: DC | PRN

## 2023-11-21 MED ORDER — GERHARDT'S BUTT CREAM
TOPICAL_CREAM | Freq: Three times a day (TID) | CUTANEOUS | Status: DC
  Administered 2023-11-26: 1 via TOPICAL
  Filled 2023-11-21: qty 60

## 2023-11-21 MED ORDER — SODIUM CHLORIDE 0.9 % IV SOLN
INTRAVENOUS | Status: AC
Start: 2023-11-21 — End: 2023-11-22

## 2023-11-21 MED ORDER — PANTOPRAZOLE SODIUM 40 MG IV SOLR
40.0000 mg | Freq: Two times a day (BID) | INTRAVENOUS | Status: DC
  Administered 2023-11-21 – 2023-11-22 (×3): 40 mg via INTRAVENOUS
  Filled 2023-11-21 (×3): qty 10

## 2023-11-21 MED ORDER — IRON SUCROSE 300 MG IVPB - SIMPLE MED
300.0000 mg | Freq: Once | Status: AC
Start: 2023-11-21 — End: 2023-11-21
  Administered 2023-11-21: 300 mg via INTRAVENOUS
  Filled 2023-11-21: qty 300

## 2023-11-21 NOTE — Progress Notes (Signed)
 Bilateral lower extremity venous duplex has been completed. Preliminary results can be found in CV Proc through chart review.   11/21/23 11:53 AM Cathlyn Collet RVT

## 2023-11-21 NOTE — Consult Note (Addendum)
 Referring Provider: EDP Primary Care Physician:  Pcp, No Primary Gastroenterologist:  Dr. Wilhelmenia  Reason for Consultation: Anemia, heme positive stools  HPI: Greg [name] is a 73 y.o. male with PMH of gastric ulcer/Barrett's esophagus, microcytic anemia, hyperlipidemia, hypertension.  He had echo in 12/2021 with EF 35-40% presented to the ED after he felt wetness in between legs and had blood in stool. Initially concerned about Fournier's gangrene, however, afebrile, labs without leukocytosis, CT scan was done as below no significant finding.  Hemoccult was positive.  Hemoglobin initially 10.0 g, which is fairly stable from when it was last checked in December 2023.  This morning to 8.8 g, but this after he got some IV fluids.  BUN is normal.  Iron studies low, B12 and folate normal.  Seen by our service for anemia and iron deficiency in November and December 2023.  Never followed up after that.  Had 2 EGDs with APC of some AVMs as below.  Colonoscopy cannot be completed due to stool throughout the colon and large inguinal hernia containing colon.  Capsule not performed due to that issue as well.  He is a poor historian.  Mumbles and seems to veer off topic when asking him questions.  Apparently he lives alone and is very private, nobody is allowed into his house.  When he arrived here he was covered in urine.  Does have a sister who brings him stuff but reportedly he does not let her in the house.  Does not seem to have any abdominal pain.  No BM since he has been here.  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 Recommend follow-up to ensure stability.   Large right sided inguinal hernia extending to the scrotal sac containing bowel, grossly stable to prior. No bowel obstruction. Prominent collateral  veins overlying the right scrotal sac.  Colonoscopy 01/2022 for evaluation of anemia and dark stools: - Preparation of the colon was fair - extensive lavage performed as above - One 4 mm polyp in the transverse colon, removed with a cold snare. Resected and retrieved. - Internal hemorrhoids. - Right colon is within a hernia sac in the scrotum - retained stool there and could not clear that area, cecal intubation not attempted. Rather narrowed lumen at entrance to the hernia sac. - The examination was otherwise normal.  A. COLON, TRANSVERSE, POLYPECTOMY:  Sessile serrated adenoma without cytologic dysplasia.  Multiple additional levels examined.    EGD 01/19/2022: - Z- line regular, 36 cm from the incisors. - No gross lesions in the entire esophagus. - A single bleeding angioectasia in the stomach. Treated with argon plasma coagulation ( APC) . - A single single angioectasia in the duodenum. Treated with argon plasma coagulation ( APC) . - A single angioectasia in the duodenum. Treated with argon plasma coagulation ( APC) . - No specimens collected.   EGD 12/20/2021: - Esophageal mucosal changes suspicious for short- segment Barrett' s esophagus. Biopsied. - Non- bleeding gastric ulcer with no stigmata of bleeding. Biopsied. - Mucosal changes in the duodenum. Biopsied.  A. SMALL BOWEL, BIOPSY:  Reactive duodenal mucosa with focal gastric metaplasia compatible with  peptic duodenitis   B. STOMACH, ANTRUM, BODY, FUNDUS, BIOPSY:  Reactive gastropathy with focal surface erosion  Negative for H. pylori, intestinal metaplasia, dysplasia and carcinoma   C. DISTAL ESOPHAGUS, BIOPSY:  Reactive squamous mucosa  Mild chronic gastritis with focal intestinal metaplasia  Negative for dysplasia and carcinoma (see comment)   COMMENT:  The changes seen within the distal esophageal biopsies would be  compatible with a diagnosis of Barrett's esophagus depending on the  relationship to the Z-line.   Clinical and endoscopic correlation is  recommended.   Past Medical History:  Diagnosis Date   Barrett's esophagus    Gastric ulcer     Past Surgical History:  Procedure Laterality Date   BIOPSY  12/20/2021   Procedure: BIOPSY;  Surgeon: Eda Iha, MD;  Location: [facility] ENDOSCOPY;  Service: Gastroenterology;;   COLONOSCOPY WITH PROPOFOL  N/A 01/22/2022   Procedure: COLONOSCOPY WITH PROPOFOL ;  Surgeon: Leigh Elspeth SQUIBB, MD;  Location: [facility] ENDOSCOPY;  Service: Gastroenterology;  Laterality: N/A;   ESOPHAGOGASTRODUODENOSCOPY (EGD) WITH PROPOFOL  N/A 12/20/2021   Procedure: ESOPHAGOGASTRODUODENOSCOPY (EGD) WITH PROPOFOL ;  Surgeon: Eda Iha, MD;  Location: [facility] ENDOSCOPY;  Service: Gastroenterology;  Laterality: N/A;   ESOPHAGOGASTRODUODENOSCOPY (EGD) WITH PROPOFOL  N/A 01/19/2022   Procedure: ESOPHAGOGASTRODUODENOSCOPY (EGD) WITH PROPOFOL ;  Surgeon: Shila Gustav GAILS, MD;  Location: [facility] ENDOSCOPY;  Service: Gastroenterology;  Laterality: N/A;   GIVENS CAPSULE STUDY N/A 01/22/2022   Procedure: GIVENS CAPSULE STUDY;  Surgeon: Leigh Elspeth SQUIBB, MD;  Location: [facility] ENDOSCOPY;  Service: Gastroenterology;  Laterality: N/A;   HOT HEMOSTASIS N/A 01/19/2022   Procedure: HOT HEMOSTASIS (ARGON PLASMA COAGULATION/BICAP);  Surgeon: Nandigam, Kavitha V, MD;  Location: [facility] ENDOSCOPY;  Service: Gastroenterology;  Laterality: N/A;   POLYPECTOMY  01/22/2022   Procedure: POLYPECTOMY;  Surgeon: Leigh Elspeth SQUIBB, MD;  Location: [facility] ENDOSCOPY;  Service: Gastroenterology;;    Prior to Admission medications   Medication Sig Start Date End Date Taking? Authorizing Provider  aspirin  EC 325 MG tablet Take 325-650 mg by mouth 3 (three) times daily as needed (for pain).   Yes [provider]  acetaminophen  (TYLENOL ) 325 MG tablet Take 2 tablets (650 mg total) by mouth every 6 (six) hours as needed for mild pain (or Fever >/= 101). Patient not taking: Reported on 11/20/2023 12/24/21   Sebastian Toribio GAILS, MD  albuterol  (VENTOLIN  HFA) 108 [phone] Base) MCG/ACT inhaler Inhale 2 puffs into the lungs every 4 (four) hours as needed for wheezing or shortness of breath. Patient not taking: Reported on 11/20/2023 01/25/22   Cheryle Page, MD  aspirin  EC 81 MG tablet Take 1 tablet (81 mg total) by mouth daily. Swallow whole. Patient not taking: Reported on 11/20/2023 12/25/21   Sebastian Toribio GAILS, MD  atorvastatin  (LIPITOR) 40 MG tablet Take 1 tablet (40 mg total) by mouth daily. Patient not taking: Reported on 11/20/2023 12/25/21   Sebastian Toribio GAILS, MD  empagliflozin  (JARDIANCE ) 10 MG TABS tablet Take 1 tablet (10 mg total) by mouth daily. Patient not taking: Reported on 11/20/2023 01/26/22   Cheryle Page, MD  fluticasone -salmeterol (ADVAIR  DISKUS) 250-50 MCG/ACT AEPB Inhale 1 puff into the lungs in the morning and at bedtime. Patient not taking: Reported on 11/20/2023 01/25/22   Cheryle Page, MD  furosemide  (LASIX ) 20 MG tablet Take 1 tablet (20 mg total) by mouth daily. Patient not taking: Reported on 11/20/2023 01/26/22 11/20/23  Cheryle Page, MD  pantoprazole  (PROTONIX ) 40 MG tablet Take 1 tablet (40 mg total) by mouth daily. Patient not taking: Reported on 11/20/2023 01/25/22   Cheryle Page, MD    Current Facility-Administered Medications  Medication Dose Route Frequency Provider Last Rate Last Admin   acetaminophen  (TYLENOL ) tablet 650 mg  650 mg Oral Q6H PRN Christobal Guadalajara, MD  650 mg at 11/21/23 9078   Or   acetaminophen  (TYLENOL ) suppository 650 mg  650 mg Rectal Q6H PRN Christobal Guadalajara, MD       Gerhardt's butt cream   Topical TID [place], Eric J, DO       ondansetron  (ZOFRAN ) tablet 4 mg  4 mg Oral Q6H PRN Kc, Ramesh, MD       Or   ondansetron  (ZOFRAN ) injection 4 mg  4 mg Intravenous Q6H PRN Kc, Ramesh, MD       Oral care mouth rinse  15 mL Mouth Rinse PRN Kc, Ramesh, MD       pantoprazole  (PROTONIX ) injection 40 mg  40 mg Intravenous Q12H [place], Camellia PARAS, DO   40 mg at 11/21/23 9076     Allergies as of 11/20/2023   (No Known Allergies)    Family History  Problem Relation Age of Onset   Stomach cancer Neg Hx    Colon cancer Neg Hx    Esophageal cancer Neg Hx    Pancreatic cancer Neg Hx     Social History   Socioeconomic History   Marital status: Legally Separated    Spouse name: Not on file   Number of children: Not on file   Years of education: Not on file   Highest education level: Not on file  Occupational History   Occupation: retired  Tobacco Use   Smoking status: Every Day    Current packs/day: 1.00    Types: Cigarettes   Smokeless tobacco: Never  Vaping Use   Vaping status: Never Used  Substance and Sexual Activity   Alcohol use: Not Currently    Comment: former   Drug use: Yes    Types: Marijuana    Comment: partially   Sexual activity: Not on file  Other Topics Concern   Not on file  Social History Narrative   Not on file   Social Drivers of Health   Financial Resource Strain: Low Risk  (02/12/2022)   Received from [facility]   Overall Financial Resource Strain (CARDIA)    Difficulty of Paying Living Expenses: Not very hard  Food Insecurity: No Food Insecurity (11/20/2023)   Hunger Vital Sign    Worried About Running Out of Food in the Last Year: Never true    Ran Out of Food in the Last Year: Never true  Transportation Needs: No Transportation Needs (11/20/2023)   PRAPARE - Administrator, Civil Service (Medical): No    Lack of Transportation (Non-Medical): No  Physical Activity: Inactive (11/06/2021)   Received from [facility]   Exercise Vital Sign    On average, how many days per week do you engage in moderate to strenuous exercise (like a brisk walk)?: 0 days    On average, how many minutes do you engage in exercise at this level?: 0 min  Stress: No Stress Concern Present (11/06/2021)   Received from [facility] - Occupational Stress Questionnaire    Feeling of  Stress : Not at all  Social Connections: Moderately Isolated (11/21/2023)   Social Connection and Isolation Panel    Frequency of Communication with Friends and Family: Three times a week    Frequency of Social Gatherings with Friends and Family: Three times a week    Attends Religious Services: 1 to 4 times per year    Active Member of Clubs or Organizations: No    Attends Banker Meetings: Never  Marital Status: Separated  Intimate Partner Violence: Not At Risk (11/20/2023)   Humiliation, Afraid, Rape, and Kick questionnaire    Fear of Current or Ex-Partner: No    Emotionally Abused: No    Physically Abused: No    Sexually Abused: No    Review of Systems: ROS is O/W negative except as mentioned in HPI.  Physical Exam: Vital signs in last 24 hours: Temp:  [98 F (36.7 C)-99.6 F (37.6 C)] 98 F (36.7 C) (10/10 0559) Pulse Rate:  [79-104] 79 (10/10 0559) Resp:  [15-21] 18 (10/10 0559) BP: (107-162)/(69-100) 118/84 (10/10 0559) SpO2:  [97 %-100 %] 99 % (10/10 0559) Weight:  [49.8 kg] 49.8 kg (10/09 2227) Last BM Date : 11/20/23 General:  Alert, pleasant, in NAD Head:  Normocephalic and atraumatic. Eyes:  Sclera clear, no icterus.  Conjunctiva pink. Ears:  Normal auditory acuity. Mouth:  Poor dentition. Lungs:  Clear throughout to auscultation.  No wheezes, crackles, or rhonchi.  Heart:  Regular rate and rhythm; no murmurs, clicks, rubs, or gallops. Abdomen:  Soft,nontender, BS active,nonpalp mass or hsm.   Rectal:  Heme positive in the ED.  Msk:  Symmetrical without gross deformities.  Pulses:  Normal pulses noted. Extremities:  Without clubbing or edema. Neurologic:  Alert, mumbles, poor/difficult historian  Intake/Output from previous day: 10/09 0701 - 10/10 0700 In: 659.5 [P.O.:480; I.V.:179.5] Out: 75 [Urine:75]  Lab Results: Recent Labs    11/20/23 1404 11/21/23 0057 11/21/23 0833  WBC 4.6 3.4*  --   HGB 10.0* 8.8* 8.8*  HCT 33.2* 29.4*  29.9*  PLT 195 190  --    BMET Recent Labs    11/20/23 1404 11/21/23 0057  NA 133* 133*  K 4.5 3.9  CL 97* 98  CO2 24 24  GLUCOSE 97 86  BUN 11 8  CREATININE 0.72 0.66  CALCIUM  9.8 9.1   LFT Recent Labs    11/20/23 1404  PROT 7.8  ALBUMIN 4.2  AST 22  ALT 15  ALKPHOS 80  BILITOT 0.5   Studies/Results: CT ABDOMEN PELVIS W CONTRAST Result Date: 11/20/2023 CLINICAL DATA:  Sepsis EXAM: CT ABDOMEN AND PELVIS WITH CONTRAST TECHNIQUE: Multidetector CT imaging of the abdomen and pelvis was performed using the standard protocol following bolus administration of intravenous contrast. RADIATION DOSE REDUCTION: This exam was performed according to the departmental dose-optimization program which includes automated exposure control, adjustment of the mA and/or kV according to patient size and/or use of iterative reconstruction technique. CONTRAST:  OMNIPAQUE  IOHEXOL  300 MG/ML  SOLN COMPARISON:  CT angio chest abdomen pelvis December 18, 2021 FINDINGS: Lower chest: No acute abnormality. Hepatobiliary: No focal liver abnormality is seen. No gallstones, gallbladder wall thickening, or biliary dilatation. Pancreas: Unremarkable. No pancreatic ductal dilatation or surrounding inflammatory changes. Spleen: Normal in size without focal abnormality. Adrenals/Urinary Tract: Adrenal glands are unremarkable. Kidneys are normal, without renal calculi, focal lesion, or hydronephrosis. Bladder is unremarkable. Stomach/Bowel: Stomach is within normal limits. Appendix appears normal. See below for malposition of right hemicolon within the scrotal sac hernia. Large stool throughout the rectum. Otherwise no evidence of bowel wall thickening, distention, or inflammatory changes. Vascular/Lymphatic: Atherosclerotic calcifications of aorta and branches with severe tortuosity. Aneurysmal dilation of abdominal aorta measuring up to 4 x 3.8 cm with a mural thrombus extending all the way from infrarenal to bilateral  common iliac arteries, right-greater-than-left, previously measured 3.3 x 3.4 cm. Right common iliac aneurysm measures up to 4.7 x 3.2 cm with large mural thrombus, previously  measured 2.8, query small dissection flap stable to prior. No enlarged abdominal or pelvic lymph nodes. Bilateral prominent inguinal lymph nodes measuring up to 11 mm. Reproductive: Large right inguinal hernia scrotal sac containing colon (right hemicolon and small-bowel) and fat measuring approximately 16 x 12 cm. No bowel obstruction identified. Superficial collateral vessels are identified overlying the right scrotal sac without active bleeding. Small hydrocele.  Moderate prostatomegaly. Other: No abdominopelvic ascites. Musculoskeletal: No acute or significant osseous findings. Degenerative changes of the spine. 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 Recommend follow-up to ensure stability. Large right sided inguinal hernia extending to the scrotal sac containing bowel, grossly stable to prior. No bowel obstruction. Prominent collateral veins overlying the right scrotal sac. Electronically Signed   By: Megan  Zare M.D.   On: 11/20/2023 17:16   IMPRESSION:  *Chronic iron deficiency anemia: Started evaluation from our standpoint in November 2023/December 2023, but colonoscopy was incomplete due to large hernia and stool throughout the colon.  Did not proceed with capsule because of that reason either.  He did have a nonbleeding gastric ulcer and gastric and duodenal AVMs that were treated with APC.  He is Hemoccult positive here, but when he came in his hemoglobin was stable compared to last in December 2023.  Now down slightly, but could be a component of dilutional effect.  Hgb 8.8 grams this AM. *Large inguinal hernia in the scrotal sac containing colon which is said to be right colon and  small bowel and fat on CT scan.  This was also noted during his colonoscopy in 2023 and colonoscopy was incomplete due to this. *Medical non-compliance/poor historian/poor self-care  PLAN: - Monitor hemoglobin and transfuse if needed. - Pantoprazole  40 mg IV twice daily for now as ordered. -?  EGD or enteroscopy per Dr. Abran.  Doubt that we will reattempt colonoscopy for now as he had poor prep last time and has colon contained in his large inguinal hernia and that impeded procedure last time.   Harlene BIRCH. Zehr  11/21/2023, 9:24 AM  GI ATTENDING  History, laboratories, x-rays, prior endoscopy reports reviewed.  Patient seen and examined at.  Agree with comprehensive consultation note as outlined above.  Impression: 1.  Chronic anemia iron deficiency component.  Positive stool without active bleeding. 2.  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  Incomplete colonoscopy due to hernia 4.  Cannot do capsule endoscopy due to hernia  Recommendations: 1.  Iron infusion while inpatient 2.  Transfuse as clinically necessary 3.  Sent home on daily iron supplement 4.  He needs a PCP to monitor his blood counts 5.  He should consider hernia repair.  This would allow colonoscopy.  No further plans or recommendations from GI perspective.  Outpatient GI follow-up with Dr. Wilhelmenia as needed.  Will sign off  Cire Clute N. Abran Raddle., M.D. [facility] Division of Gastroenterology

## 2023-11-21 NOTE — Progress Notes (Signed)
 PROGRESS NOTE    Greg [name]  FMW:[id number] DOB: Jul 22, 1950 DOA: 11/20/2023 PCP: Pcp, No    Brief Narrative:   Greg [name] is a 73 y.o. 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  Patient is a poor historian, lives alone and very private and letting anyone in his house per family.  Denies nausea, no vomiting, no chest pain, no fever/chills, no scrotal pain.  Patient reports the scrotum has been swollen for 1.5 years; unchanged.  In the ED, temperature 98.3 F, HR 97, RR 16, BP 143/92, SpO2 100% in room air.  WBC 4.6, hemoglobin 10.0, platelet count 195.  Sodium 133, potassium 4.5, chloride 97, CO2 24, glucose 97, BUN 11, creatinine 0.72.  Lipase 46.  AST 22, ALT 15, total bilirubin 0.5.  BNP 94.1.  Lactic acid 1.8.  CT abdomen/pelvis with contrast with noted aneurysmal dilation of the abdominal aorta extending to the common iliac arteries with mural thrombus and severe atherosclerotic calcifications, enlarged since prior exam 2023; large right sided inguinal hernia extending to the scrotal sac containing bowel, stable from prior exams without bowel obstruction, prominent collateral veins overlying right scrotal sac.  GI, vascular surgery was consulted.  TRH was consulted for admission for further evaluation and management of concern for recurrent UGIB  Assessment & Plan:   Concern for acute upper GI bleed Hx AVMs s/p APC Hx iron deficiency anemia Hx gastric cancer/Barrett's esophagus Patient presenting with concerns of recurrent GI bleed.  History of AVMs and underwent APC 2023.  Also with known iron deficiency anemia, lost to follow-up after previous hospitalization.  Patient with hemoglobin 10.0 at time of admission.  Anemia panel with iron 16, TIBC 417, ferritin 56, folate 14.4,  vitamin B12 735. -- [facility] following, appreciate assistance -- Hgb 10.0>8.8>8.8, stable -- Protonix  40 mg IV every 12 hours -- H&H every 12 hours -- CBC in a.m. -- N.p.o. awaiting further GI recommendations, need for upper endoscopy  Hyponatremia Sodium slightly low 133, suspect etiology likely secondary to poor oral intake.  Supported with IV fluid hydration. -- Repeat BMP in a.m.  Abdominal aortic aneurysm CT abdomen/pelvis with noted aneurysmal dilation of the abdominal aorta extending to the common iliac arteries with mural thrombus and severe atherosclerotic calcifications, enlarged since prior exam 2023.  Vascular surgery was consulted, Dr. Pearline reviewed imaging and reported that this is a normal amount of growth over the last 2 years and recommend outpatient follow-up in vascular surgery clinic with repeat CT a abdomen/pelvis -- Outpatient follow-up with vascular surgery  Large right inguinal hernia Has been present with no significant change over the past 1.5 years per patient.  CT abdomen/pelvis on admission with large right sided inguinal hernia extending to the scrotal sac containing bowel, stable from prior exams without obstruction. -- Outpatient follow-up with PCP/general surgery as needed  Chronic systolic/diastolic congestive heart failure HTN Not on any home medications.  TTE 12/19/2021 with LVEF 35-40%, LV with moderate decreased function with regional wall motion abnormalities and severe hypokinesis of the mid--apical anteroseptal/inferior septal and anterior walls, grade 2 diastolic dysfunction, IVC dilated. -- BP 118/84 this morning -- Repeat TTE pending  Weakness/debility/deconditioning -- PT/OT evaluation -- TOC consult; reported poor living condition  Erythema intertrigo, friction dermatitis to genital/thighs Seen by wound RN Cleanse buttocks/sacrum/ischium and scrotum with Vashe wound cleanser,  do not rinse and allow to air dry.  Apply Gerhardt's Butt  Cream 3 times a day and prn soiling.  May sprinkle over Gerhardt's with floor stock antifungal powder (white and green label microguard) for extra drying effect.    DVT prophylaxis: SCDs Start: 11/20/23 1921    Code Status: Full Code Family Communication: No family present at bedside this morning  Disposition Plan:  Level of care: Telemetry Status is: Observation The patient remains OBS appropriate and will d/c before 2 midnights.    Consultants:  [facility] Vascular surgery, Dr. Pearline  Procedures:  TTE: Pending Scrotal ultrasound: Pending  Antimicrobials:  None   Subjective: Patient seen examined bedside, lying in bed.  Watching TV.  No complaints this morning.  Awaiting GI evaluation for consideration of EGD for concern of recurrent upper GI bleeding with history of previous AVMs requiring APC.  Hemoglobin stable, 8.8.  No further bleeding reported.  Patient denies headache, no dizziness, no chest pain, no shortness of breath, no abdominal pain, no fever.  No acute concerns overnight per nurse staff.  Objective: Vitals:   11/20/23 2158 11/20/23 2227 11/21/23 0559 11/21/23 1004  BP:  (!) 137/95 118/84 114/80  Pulse:  96 79 73  Resp:  20 18 18   Temp: 98.5 F (36.9 C) 98.5 F (36.9 C) 98 F (36.7 C) 98.9 F (37.2 C)  TempSrc: Oral Oral  Oral  SpO2:  98% 99% 98%  Weight:  49.8 kg    Height:  5' 8 (1.727 m)      Intake/Output Summary (Last 24 hours) at 11/21/2023 1207 Last data filed at 11/21/2023 1150 Gross per 24 hour  Intake 659.5 ml  Output 375 ml  Net 284.5 ml   Filed Weights   11/20/23 2227  Weight: 49.8 kg    Examination:  Physical Exam: GEN: NAD, alert and oriented x 3, chronically ill/disheveled/malodorous in appearance, appears older than stated age HEENT: NCAT, PERRL, EOMI, sclera clear, dry mucous membranes, poor dentition PULM: CTAB w/o wheezes/crackles, normal respiratory effort on room air CV: RRR w/o M/G/R GI: abd soft,  NTND, + BS GU: Noted significant right sided scrotal edema without erythema/fluctuance/crepitus MSK: no peripheral edema, moves all extremities independently NEURO: No focal neurological deficit PSYCH: normal mood/affect Integumentary: Scrotum, buttock, thigh wounds as depicted below            Data Reviewed: I have personally reviewed following labs and imaging studies  CBC: Recent Labs  Lab 11/20/23 1404 11/21/23 0057 11/21/23 0833  WBC 4.6 3.4*  --   NEUTROABS 3.4  --   --   HGB 10.0* 8.8* 8.8*  HCT 33.2* 29.4* 29.9*  MCV 83.8 84.0  --   PLT 195 190  --    Basic Metabolic Panel: Recent Labs  Lab 11/20/23 1404 11/21/23 0057  NA 133* 133*  K 4.5 3.9  CL 97* 98  CO2 24 24  GLUCOSE 97 86  BUN 11 8  CREATININE 0.72 0.66  CALCIUM  9.8 9.1   GFR: Estimated Creatinine Clearance: 58.8 mL/min (by C-G formula based on SCr of 0.66 mg/dL). Liver Function Tests: Recent Labs  Lab 11/20/23 1404  AST 22  ALT 15  ALKPHOS 80  BILITOT 0.5  PROT 7.8  ALBUMIN 4.2   Recent Labs  Lab 11/20/23 1404  LIPASE 46   No results for input(s): AMMONIA in the last 168 hours. Coagulation Profile: No results for input(s): INR, PROTIME in the last 168 hours. Cardiac Enzymes: No results for  input(s): CKTOTAL, CKMB, CKMBINDEX, TROPONINI in the last 168 hours. BNP (last 3 results) Recent Labs    11/20/23 1404  PROBNP 94.1   HbA1C: No results for input(s): HGBA1C in the last 72 hours. CBG: No results for input(s): GLUCAP in the last 168 hours. Lipid Profile: No results for input(s): CHOL, HDL, LDLCALC, TRIG, CHOLHDL, LDLDIRECT in the last 72 hours. Thyroid Function Tests: Recent Labs    11/21/23 0057  TSH 1.340   Anemia Panel: Recent Labs    11/21/23 0057  VITAMINB12 735  FOLATE 14.4  FERRITIN 56  TIBC 417  IRON 16*  RETICCTPCT 1.9   Sepsis Labs: Recent Labs  Lab 11/20/23 1418  LATICACIDVEN 1.8    Recent Results (from the  past 240 hours)  Resp panel by RT-PCR (RSV, Flu A&B, Covid) Anterior Nasal Swab     Status: None   Collection Time: 11/20/23  7:23 PM   Specimen: Anterior Nasal Swab  Result Value Ref Range Status   SARS Coronavirus 2 by RT PCR NEGATIVE NEGATIVE Final    Comment: (NOTE) SARS-CoV-2 target nucleic acids are NOT DETECTED.  The SARS-CoV-2 RNA is generally detectable in upper respiratory specimens during the acute phase of infection. The lowest concentration of SARS-CoV-2 viral copies this assay can detect is 138 copies/mL. A negative result does not preclude SARS-Cov-2 infection and should not be used as the sole basis for treatment or other patient management decisions. A negative result may occur with  improper specimen collection/handling, submission of specimen other than nasopharyngeal swab, presence of viral mutation(s) within the areas targeted by this assay, and inadequate number of viral copies(<138 copies/mL). A negative result must be combined with clinical observations, patient history, and epidemiological information. The expected result is Negative.  Fact Sheet for Patients:  BloggerCourse.com  Fact Sheet for Healthcare Providers:  SeriousBroker.it  This test is no t yet approved or cleared by the [place]  FDA and  has been authorized for detection and/or diagnosis of SARS-CoV-2 by FDA under an Emergency Use Authorization (EUA). This EUA will remain  in effect (meaning this test can be used) for the duration of the COVID-19 declaration under Section 564(b)(1) of the Act, 21 U.S.C.section 360bbb-3(b)(1), unless the authorization is terminated  or revoked sooner.       Influenza A by PCR NEGATIVE NEGATIVE Final   Influenza B by PCR NEGATIVE NEGATIVE Final    Comment: (NOTE) The Xpert Xpress SARS-CoV-2/FLU/RSV plus assay is intended as an aid in the diagnosis of influenza from Nasopharyngeal swab specimens  and should not be used as a sole basis for treatment. Nasal washings and aspirates are unacceptable for Xpert Xpress SARS-CoV-2/FLU/RSV testing.  Fact Sheet for Patients: BloggerCourse.com  Fact Sheet for Healthcare Providers: SeriousBroker.it  This test is not yet approved or cleared by the [place]  FDA and has been authorized for detection and/or diagnosis of SARS-CoV-2 by FDA under an Emergency Use Authorization (EUA). This EUA will remain in effect (meaning this test can be used) for the duration of the COVID-19 declaration under Section 564(b)(1) of the Act, 21 U.S.C. section 360bbb-3(b)(1), unless the authorization is terminated or revoked.     Resp Syncytial Virus by PCR NEGATIVE NEGATIVE Final    Comment: (NOTE) Fact Sheet for Patients: BloggerCourse.com  Fact Sheet for Healthcare Providers: SeriousBroker.it  This test is not yet approved or cleared by the [place]  FDA and has been authorized for detection and/or diagnosis of SARS-CoV-2 by FDA under an Emergency Use Authorization (EUA).  This EUA will remain in effect (meaning this test can be used) for the duration of the COVID-19 declaration under Section 564(b)(1) of the Act, 21 U.S.C. section 360bbb-3(b)(1), unless the authorization is terminated or revoked.  Performed at [facility], [address], [place], [place] [address]          Radiology Studies: CT ABDOMEN PELVIS W CONTRAST Result Date: 11/20/2023 CLINICAL DATA:  Sepsis EXAM: CT ABDOMEN AND PELVIS WITH CONTRAST TECHNIQUE: Multidetector CT imaging of the abdomen and pelvis was performed using the standard protocol following bolus administration of intravenous contrast. RADIATION DOSE REDUCTION: This exam was performed according to the departmental dose-optimization program which includes automated exposure control, adjustment  of the mA and/or kV according to patient size and/or use of iterative reconstruction technique. CONTRAST:  OMNIPAQUE  IOHEXOL  300 MG/ML  SOLN COMPARISON:  CT angio chest abdomen pelvis December 18, 2021 FINDINGS: Lower chest: No acute abnormality. Hepatobiliary: No focal liver abnormality is seen. No gallstones, gallbladder wall thickening, or biliary dilatation. Pancreas: Unremarkable. No pancreatic ductal dilatation or surrounding inflammatory changes. Spleen: Normal in size without focal abnormality. Adrenals/Urinary Tract: Adrenal glands are unremarkable. Kidneys are normal, without renal calculi, focal lesion, or hydronephrosis. Bladder is unremarkable. Stomach/Bowel: Stomach is within normal limits. Appendix appears normal. See below for malposition of right hemicolon within the scrotal sac hernia. Large stool throughout the rectum. Otherwise no evidence of bowel wall thickening, distention, or inflammatory changes. Vascular/Lymphatic: Atherosclerotic calcifications of aorta and branches with severe tortuosity. 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 No enlarged abdominal or pelvic lymph nodes. Bilateral prominent inguinal lymph nodes measuring up to 11 mm. Reproductive: Large right inguinal hernia scrotal sac containing colon (right hemicolon and small-bowel) and fat measuring approximately 16 x 12 cm. No bowel obstruction identified. Superficial collateral vessels are identified overlying the right scrotal sac without active bleeding. Small hydrocele.  Moderate prostatomegaly. Other: No abdominopelvic ascites. Musculoskeletal: No acute or significant osseous findings.  Degenerative changes of the spine. 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 Recommend follow-up to ensure stability. Large right sided inguinal hernia extending to the scrotal sac containing bowel, grossly stable to prior. No bowel obstruction. Prominent collateral veins overlying the right scrotal sac. Electronically Signed   By: Megan  Zare M.D.   On: 11/20/2023 17:16        Scheduled Meds:  Gerhardt's butt cream   Topical TID   pantoprazole  (PROTONIX ) IV  40 mg Intravenous Q12H   Continuous Infusions:   LOS: 0 days    Time spent: 52 minutes spent on 11/21/2023 caring for this patient face-to-face including chart review, ordering labs/tests, documenting, discussion with nursing staff, consultants, updating family and interview/physical exam    Camellia PARAS [place], DO [facility] Available via Epic secure chat 7am-7pm After these hours, please refer to coverage provider listed on [email or web address] 11/21/2023, 12:07 PM

## 2023-11-21 NOTE — Telephone Encounter (Signed)
 Auth Submission: NO AUTH NEEDED Site of care: Site of care: [facility] Payer: MEDICARE A/B Medication & CPT/J Code(s) submitted: Feraheme (ferumoxytol) [id number] Diagnosis Code: D50.0 Route of submission (phone, fax, portal):  Phone # Fax # Auth type: Buy/Bill HB Units/visits requested: X2 DOSES Reference number:  Approval from: 11/21/23 to 03/13/24

## 2023-11-21 NOTE — Progress Notes (Signed)
 PT Cancellation Note  Patient Details Name: Greg [name] MRN: [id number] DOB: 1950-04-06   Cancelled Treatment:    Reason Eval/Treat Not Completed: Patient at procedure or test/unavailable  Pt in testing this morning to r/o DVT and PT unable to return in afternoon. Will f/u tomorrow Benjiman, PT Acute Rehab Services [facility] Re[facility] [phone]  BRODIE CORRELL 11/21/2023, 5:40 PM

## 2023-11-21 NOTE — Progress Notes (Signed)
 [name] arrived to us  around 10:07 pm last night. He was in a diaper and he was currently have a bowel movement. He was able to follow commands. His skin is broken down in his groin area. His scrotum is the size of a grapefruit. He has odor of urine and cigarettes. He is able to answer most of the questions. I called his sister Ms. Greg [name] and I gave her an update on where he was and that he was stable. She told me she goes and gets food and medication for him. She says he is extremely private. He was going to a wound clinic a while ago and he stopped going. He had home health set up and he would not let the people in the home. He was suppose to have a surgery years ago and he declined. He does live alone. He sister said she was never allow to go pass the living room. She had no idea how bad the condition was of the home.  I have to repeat myself a lot to him. He mumbles when he talks at time. He is very pleasant and I had to remind he can not have any food. He is currently NPO.
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
## 2023-11-21 NOTE — Consult Note (Signed)
 WOC Nurse Consult Note: Reason for Consult: moisture damage  Wound type: 1.  Moisture Associated Skin Damage sacrum extending to posterior thighs  2.  Intertriginous dermatitis inner thighs/scrotum/groin  ICD-10 CM Codes for Irritant Dermatitis L24A2 - Due to fecal, urinary or dual incontinence  L30.4  - Erythema intertrigo. Also used for abrasion of the hand, chafing of the skin, dermatitis due to sweating and friction, friction dermatitis, friction eczema, and genital/thigh intertrigo.  Pressure Injury POA: NA, all moisture.  Patient found sitting in urine and stool at home  Measurement: sacrum extending down 15 cm x 7 cm; R ischium 7 cm x 3 cm, L ischium 6 cm x 6 cm  Widespread to entire scrotum  Wound bed: erythema with partial thickness skin loss and sloughing skin; all pink moist  Drainage (amount, consistency, odor) serosanguinous  Periwound:scrotum edematous  Dressing procedure/placement/frequency: Cleanse buttocks/sacrum/ischium and scrotum with Vashe wound cleanser, do not rinse and allow to air dry.  Apply Gerhardt's Butt Cream 3 times a day and prn soiling.  May sprinkle over Gerhardt's with floor stock antifungal powder (white and green label microguard) for extra drying effect.   POC discussed with bedside nurse. WOC team will not follow. Reconsult if further needs arise.   Thank you,     Powell Bar MSN, RN-BC, [employer]

## 2023-11-21 NOTE — Progress Notes (Signed)
  Echocardiogram 2D Echocardiogram has been performed.  [place] R 11/21/2023, 11:10 AM

## 2023-11-21 NOTE — Evaluation (Signed)
 Occupational Therapy Evaluation Patient Details Name: Greg [name] MRN: [id number] DOB: [birth year] Today's Date: 11/21/2023   History of Present Illness   Greg [name] is a 73 yr old male admitted to the hospital with wetness between his legs, with concern for GI bleed. He was found to have scrotal edema. PMH: gastric CA, Barrett's esophagus, microcytic anemia, HLD, HTN     Clinical Impressions The pt is currently limited by the below listed deficits (see OT problem list). As such, his occupational performance is compromised and he requires assistance for self-care management. It is not clear exactly what his prior level of functioning was, though he did seem to indicate having difficulty performing self-care tasks at home.  During the session, he required min assist for upper body dressing, max assist for lower body dressing, and min assist to stand using a RW. He was noted to have pronounced scrotal edema. He demonstrated fair sitting balance EOB, though his balance appeared compromised at least partially due to the scrotal edema. He will benefit from further OT services to maximize his safety and independence with self-care tasks and to decrease the risk for restricted participation in meaningful activities. Patient will benefit from continued inpatient follow up therapy, <3 hours/day.      If plan is discharge home, recommend the following:   Direct supervision/assist for medications management;Supervision due to cognitive status;Assist for transportation;Assistance with cooking/housework;Direct supervision/assist for financial management;A lot of help with bathing/dressing/bathroom     Functional Status Assessment   Patient has had a recent decline in their functional status and demonstrates the ability to make significant improvements in function in a reasonable and predictable amount of time.     Equipment Recommendations   Other (comment) (Rolling walker)      Recommendations for Other Services         Precautions/Restrictions   Precautions Precautions: Fall Restrictions Other Position/Activity Restrictions: significant scrotal edema     Mobility Bed Mobility Overal bed mobility: Needs Assistance Bed Mobility: Supine to Sit, Sit to Supine     Supine to sit: Min assist, Used rails, HOB elevated Sit to supine: Mod assist (required assist for BLE back onto the bed)        Transfers Overall transfer level: Needs assistance Equipment used: Rolling walker (2 wheels) Transfers: Sit to/from Stand Sit to Stand: Min assist, From elevated surface           General transfer comment: He further required CGA to min assist to take lateral steps towards the head of the bed using a RW      Balance     Sitting balance-Leahy Scale: Fair Sitting balance - Comments: Guarded positioning. Limited by scrotal edema       Standing balance comment: CGA to min assist with RW           ADL either performed or assessed with clinical judgement   ADL Overall ADL's : Needs assistance/impaired Eating/Feeding: Set up;Bed level   Grooming: Set up;Bed level;Wash/dry face   Upper Body Bathing: Set up;Sitting   Lower Body Bathing: Moderate assistance;Sitting/lateral leans   Upper Body Dressing : Minimal assistance;Sitting Upper Body Dressing Details (indicate cue type and reason): to donn a hospital gown seated EOB Lower Body Dressing: Maximal assistance;Sitting/lateral leans Lower Body Dressing Details (indicate cue type and reason): Pt limited by significant scrotal edema Toilet Transfer: Minimal assistance;BSC/3in1;Rolling walker (2 wheels);Stand-pivot;Cueing for sequencing   Toileting- Clothing Manipulation and Hygiene: Moderate assistance;Sit to/from stand Toileting - Architect Details (indicate cue  type and reason): at bedside commode level, based on clinical judgement             Vision Baseline  Vision/History: 1 Wears glasses              Pertinent Vitals/Pain Pain Assessment Pain Assessment: 0-10 Pain Score: 9  Pain Location: R hip and L flank Pain Intervention(s): Limited activity within patient's tolerance, Monitored during session, Patient requesting pain meds-RN notified, Repositioned     Extremity/Trunk Assessment Upper Extremity Assessment Upper Extremity Assessment: RUE deficits/detail;Right hand dominant;LUE deficits/detail RUE Deficits / Details: Chronic appearing shoulder AROM limitations with AROM for shoulder flexion being ~110 degrees. Elbow and hand AROM WFL. Functional grip strength LUE Deficits / Details: Chronic appearing shoulder AROM limitations with AROM for shoulder flexion being ~110 degrees. Elbow and hand AROM WFL. Functional grip strength   Lower Extremity Assessment Lower Extremity Assessment: RLE deficits/detail;LLE deficits/detail;Generalized weakness RLE Deficits / Details:  (AROM WFL) LLE Deficits / Details:  (AROM WFL)       Communication Communication Factors Affecting Communication: Difficulty expressing self   Cognition Arousal: Alert   Cognition: No family/caregiver present to determine baseline, Cognition impaired     Awareness: Online awareness impaired Memory impairment (select all impairments): Working [employer] fast streamer, Emergency planning/management officer functioning impairment (select all impairments): Reasoning, Organization OT - Cognition Comments: Oriented to person, to being in the hospital, month, and year. Intermittent difficulty with memory/recall. Focus/attention appeared impaired. Pt with occasional tangential thoughts. Able to follow 1 step commands.            Cueing  General Comments   Cueing Techniques: Verbal cues;Gestural cues              Home Living Family/patient expects to be discharged to:: Private residence Living Arrangements: Alone   Type of Home: Apartment Home Access: Level entry     Home Layout:  One level               Home Equipment: Cane - single point   Additional Comments: The pt was a questionable historian, therefore info reported should be verified.  He often gave conflicting reports or he had difficulty providing details.      Prior Functioning/Environment Prior Level of Function : Patient poor historian/Family not available             Mobility Comments: He reported using a cane for ambulation. ADLs Comments: He indicated he has had difficulty with dressing tasks for years. He did not give definitive reponses when asked if he is normally able to manage bathing and toileting tasks at home. He does not drive.    OT Problem List: Decreased strength;Decreased range of motion;Impaired balance (sitting and/or standing);Decreased cognition;Decreased knowledge of use of DME or AE;Decreased knowledge of precautions;Pain;Increased edema   OT Treatment/Interventions: Self-care/ADL training;Therapeutic exercise;Therapeutic activities;Energy conservation;DME and/or AE instruction;Patient/family education;Balance training      OT Goals(Current goals can be found in the care plan section)   Acute Rehab OT Goals OT Goal Formulation: With patient Time For Goal Achievement: 12/05/23 Potential to Achieve Goals: Good ADL Goals Pt Will Perform Upper Body Dressing: with set-up;sitting Pt Will Perform Lower Body Dressing: with supervision;with set-up;sitting/lateral leans;sit to/from stand Pt Will Transfer to Toilet: with supervision;ambulating Pt Will Perform Toileting - Clothing Manipulation and hygiene: with supervision;sit to/from stand   OT Frequency:  Min 2X/week       AM-PAC OT 6 Clicks Daily Activity     Outcome Measure Help from another person  eating meals?: None Help from another person taking care of personal grooming?: A Little Help from another person toileting, which includes using toliet, bedpan, or urinal?: A Lot Help from another person bathing  (including washing, rinsing, drying)?: A Lot Help from another person to put on and taking off regular upper body clothing?: A Little Help from another person to put on and taking off regular lower body clothing?: A Lot 6 Click Score: 16   End of Session Equipment Utilized During Treatment: Rolling walker (2 wheels) Nurse Communication: Patient requests pain meds;Mobility status  Activity Tolerance: Patient limited by pain Patient left: in bed;with call bell/phone within reach;with bed alarm set  OT Visit Diagnosis: Unsteadiness on feet (R26.81);Other abnormalities of gait and mobility (R26.89);Muscle weakness (generalized) (M62.81);Other symptoms and signs involving cognitive function;Pain                Time: 8443-8376 OT Time Calculation (min): 27 min Charges:  OT General Charges $OT Visit: 1 Visit OT Evaluation $OT Eval Moderate Complexity: 1 Mod OT Treatments $Therapeutic Activity: 8-22 mins    Delanna JINNY Lesches, OTR/L 11/21/2023, 5:28 PM

## 2023-11-21 NOTE — Telephone Encounter (Signed)
 Patient referred to infusion pharmacy team for ambulatory infusion of IV iron.  Insurance - Medicare Part A/B Site of care - Site of care: [facility] Dx code - D64.9/D50.9 IV Iron Therapy - Feraheme 510 mg x 2 Infusion appointments - Scheduling team will schedule patient as soon as possible.   Thank you,  Norton Blush, PharmD, BCSCP Pharmacist [facility]

## 2023-11-21 NOTE — Plan of Care (Signed)

## 2023-11-22 DIAGNOSIS — D508 Other iron deficiency anemias: Secondary | ICD-10-CM | POA: Diagnosis not present

## 2023-11-22 DIAGNOSIS — K922 Gastrointestinal hemorrhage, unspecified: Secondary | ICD-10-CM | POA: Diagnosis not present

## 2023-11-22 LAB — BASIC METABOLIC PANEL WITH GFR
Anion gap: 10 (ref 5–15)
BUN: 7 mg/dL — ABNORMAL LOW (ref 8–23)
CO2: 23 mmol/L (ref 22–32)
Calcium: 8.8 mg/dL — ABNORMAL LOW (ref 8.9–10.3)
Chloride: 101 mmol/L (ref 98–111)
Creatinine, Ser: 0.73 mg/dL (ref 0.61–1.24)
GFR, Estimated: >60 mL/min (ref >60)
Glucose, Bld: 95 mg/dL (ref 70–99)
Potassium: 3.7 mmol/L (ref 3.5–5.1)
Sodium: 134 mmol/L — ABNORMAL LOW (ref 135–145)

## 2023-11-22 LAB — CBC
HCT: 29.9 % — ABNORMAL LOW (ref 39.0–52.0)
Hemoglobin: 8.6 g/dL — ABNORMAL LOW (ref 13.0–17.0)
MCH: 24.6 pg — ABNORMAL LOW (ref 26.0–34.0)
MCHC: 28.8 g/dL — ABNORMAL LOW (ref 30.0–36.0)
MCV: 85.4 fL (ref 80.0–100.0)
Platelets: 208 K/uL (ref 150–400)
RBC: 3.5 MIL/uL — ABNORMAL LOW (ref 4.22–5.81)
RDW: 18.6 % — ABNORMAL HIGH (ref 11.5–15.5)
WBC: 3.7 K/uL — ABNORMAL LOW (ref 4.0–10.5)
nRBC: 0 % (ref 0.0–0.2)

## 2023-11-22 LAB — MAGNESIUM: Magnesium: 2.3 mg/dL (ref 1.7–2.4)

## 2023-11-22 LAB — PHOSPHORUS: Phosphorus: 2.5 mg/dL (ref 2.5–4.6)

## 2023-11-22 MED ORDER — FERROUS SULFATE 325 (65 FE) MG PO TABS
325.0000 mg | ORAL_TABLET | Freq: Every day | ORAL | Status: DC
  Administered 2023-11-23 – 2023-11-26 (×4): 325 mg via ORAL
  Filled 2023-11-22 (×4): qty 1

## 2023-11-22 MED ORDER — OXYCODONE HCL 5 MG PO TABS
5.0000 mg | ORAL_TABLET | Freq: Four times a day (QID) | ORAL | Status: DC | PRN
  Administered 2023-11-22 – 2023-11-26 (×13): 5 mg via ORAL
  Filled 2023-11-22 (×13): qty 1

## 2023-11-22 MED ORDER — PANTOPRAZOLE SODIUM 40 MG PO TBEC
40.0000 mg | DELAYED_RELEASE_TABLET | Freq: Two times a day (BID) | ORAL | Status: DC
  Administered 2023-11-22 – 2023-11-26 (×8): 40 mg via ORAL
  Filled 2023-11-22 (×8): qty 1

## 2023-11-22 NOTE — TOC Progression Note (Signed)
 Transition of Care [facility]) - Progression Note    Patient Details  Name: Greg [name] MRN: [id number] Date of Birth: [birth year]  Transition of Care [facility]) CM/SW Contact  Sheri ONEIDA Sharps, [place] Phone Number: 11/22/2023, 1:49 PM  Clinical Narrative:    Pt recommended for SNF. PASRR obtained and FL2 completed. SNF ref faxed and awaiting bed offers.   Expected Discharge Plan: Home w Home Health Services Barriers to Discharge: Continued Medical Work up               Expected Discharge Plan and Services   Discharge Planning Services: CM Consult Post Acute Care Choice: Home Health Living arrangements for the past 2 months: Single Family Home                                       Social Drivers of Health (SDOH) Interventions SDOH Screenings   Food Insecurity: No Food Insecurity (11/20/2023)  Housing: Low Risk  (11/20/2023)  Transportation Needs: No Transportation Needs (11/20/2023)  Utilities: Not At Risk (11/20/2023)  Financial Resource Strain: Low Risk  (02/12/2022)   Received from [facility]  Physical Activity: Inactive (11/06/2021)   Received from [facility]  Social Connections: Moderately Isolated (11/21/2023)  Stress: No Stress Concern Present (11/06/2021)   Received from [facility]  Tobacco Use: High Risk (11/20/2023)    Readmission Risk Interventions    12/24/2021    1:07 PM 12/20/2021   10:08 AM 12/20/2021   10:03 AM  Readmission Risk Prevention Plan  Post Dischage Appt Complete Complete Complete  Medication Screening Complete Complete Complete  Transportation Screening Complete Complete Complete

## 2023-11-22 NOTE — Plan of Care (Signed)

## 2023-11-22 NOTE — Evaluation (Signed)
 Physical Therapy Evaluation Patient Details Name: Cougar Imel MRN: [id number] DOB: [birth year] Today's Date: 11/22/2023  History of Present Illness  Mr. Stapel is a 73 yr old male admitted to the hospital with wetness between his legs, with concern for GI bleed. He was found to have scrotal edema. PMH: gastric CA, Barrett's esophagus, microcytic anemia, HLD, HTN  Clinical Impression  Pt admitted with above symptoms. Pt poor historian and with communication difficulities but command following intact. Pt performed bed mobility with SUP from elevated HOB with use of bed rails, CGA for transfers with RW and single UE support as pt supported his scrotum with RUE, declined scrotal sling. Pt currently with functional limitations due to the deficits listed below (see PT Problem List). Patient will benefit from continued inpatient follow up therapy, <3 hours/day. Pt will benefit from acute skilled PT to increase their independence and safety with mobility to allow discharge.          If plan is discharge home, recommend the following: A little help with walking and/or transfers;A little help with bathing/dressing/bathroom;Assistance with cooking/housework;Assist for transportation   Can travel by private vehicle   Yes    Equipment Recommendations Other (comment) (TBD)  Recommendations for Other Services       Functional Status Assessment Patient has had a recent decline in their functional status and demonstrates the ability to make significant improvements in function in a reasonable and predictable amount of time.     Precautions / Restrictions Precautions Precautions: Fall Restrictions Weight Bearing Restrictions Per Provider Order: No Other Position/Activity Restrictions: significant scrotal edema      Mobility  Bed Mobility Overal bed mobility: Needs Assistance Bed Mobility: Supine to Sit     Supine to sit: Used rails, HOB elevated, Supervision     General bed mobility  comments: Pt given extra time, able to perform bed mobility with SUP with HOB elevated and use of bed rails, pt utilizing RUE to stabilize swollen scrotum.    Transfers Overall transfer level: Needs assistance Equipment used: Rolling walker (2 wheels) Transfers: Sit to/from Stand, Bed to chair/wheelchair/BSC Sit to Stand: Contact guard assist   Step pivot transfers: Contact guard assist       General transfer comment: Pt able to perform STS to RW from EOB with LUE power up, LUE on RW and RUE maintaining scrotum, CGA, able to perform SPT with RW CGA.    Ambulation/Gait               General Gait Details: deferred  Stairs            Wheelchair Mobility     Tilt Bed    Modified Rankin (Stroke Patients Only)       Balance Overall balance assessment: Needs assistance, History of Falls Sitting-balance support: Feet supported, Single extremity supported Sitting balance-Leahy Scale: Good Sitting balance - Comments: Guarded positioning. Limited by scrotal edema   Standing balance support: Single extremity supported, Reliant on assistive device for balance, During functional activity Standing balance-Leahy Scale: Poor                               Pertinent Vitals/Pain Pain Assessment Pain Assessment: 0-10 Pain Score: 4  Pain Location: L flank Pain Descriptors / Indicators: Discomfort, Grimacing Pain Intervention(s): Limited activity within patient's tolerance, Monitored during session, Repositioned, Premedicated before session    Home Living Family/patient expects to be discharged to:: Private residence Living Arrangements: Alone  Type of Home: Apartment Home Access: Level entry       Home Layout: One level Home Equipment: Cane - single point      Prior Function Prior Level of Function : Patient poor historian/Family not available;History of Falls (last six months)             Mobility Comments: He reported using a crutch for  ambulation. He reports the wind knocked me down ADLs Comments: Reports independence with ADLs and doesnot have help     Extremity/Trunk Assessment   Upper Extremity Assessment Upper Extremity Assessment: Generalized weakness    Lower Extremity Assessment Lower Extremity Assessment: RLE deficits/detail;LLE deficits/detail RLE Deficits / Details: Hip/knee/ankle MMT grossly 4/5 with L >R, sensation intact, AROM WFL LLE Deficits / Details: Hip/knee/ankle MMT grossly 4/5 with L >R, sensation intact, AROM WFL    Cervical / Trunk Assessment Cervical / Trunk Assessment: Kyphotic  Communication   Communication Communication: Impaired Factors Affecting Communication: Difficulty expressing self    Cognition Arousal: Alert Behavior During Therapy: WFL for tasks assessed/performed, Flat affect   PT - Cognitive impairments: No family/caregiver present to determine baseline                         Following commands: Intact       Cueing Cueing Techniques: Verbal cues, Gestural cues     General Comments      Exercises General Exercises - Lower Extremity Ankle Circles/Pumps: AROM, Both, 20 reps   Assessment/Plan    PT Assessment Patient needs continued PT services  PT Problem List Decreased strength;Decreased activity tolerance;Decreased balance;Decreased mobility;Decreased safety awareness;Decreased knowledge of use of DME;Pain       PT Treatment Interventions DME instruction;Gait training;Functional mobility training;Therapeutic activities;Therapeutic exercise;Balance training;Patient/family education    PT Goals (Current goals can be found in the Care Plan section)  Acute Rehab PT Goals Patient Stated Goal: To reduce pain PT Goal Formulation: With patient Time For Goal Achievement: 12/06/23 Potential to Achieve Goals: Fair    Frequency Min 1X/week     Co-evaluation               AM-PAC PT 6 Clicks Mobility  Outcome Measure Help needed turning  from your back to your side while in a flat bed without using bedrails?: None Help needed moving from lying on your back to sitting on the side of a flat bed without using bedrails?: A Little Help needed moving to and from a bed to a chair (including a wheelchair)?: A Little Help needed standing up from a chair using your arms (e.g., wheelchair or bedside chair)?: A Little Help needed to walk in hospital room?: Total Help needed climbing 3-5 steps with a railing? : Total 6 Click Score: 15    End of Session Equipment Utilized During Treatment: Gait belt Activity Tolerance: Patient tolerated treatment well;No increased pain Patient left: in chair;with call bell/phone within reach;with chair alarm set;with nursing/sitter in room;with SCD's reapplied Nurse Communication: Mobility status PT Visit Diagnosis: Muscle weakness (generalized) (M62.81);History of falling (Z91.81)    Time: 1000-1028 PT Time Calculation (min) (ACUTE ONLY): 28 min   Charges:   PT Evaluation $PT Eval Low Complexity: 1 Low PT Treatments $Therapeutic Activity: 8-22 mins PT General Charges $$ ACUTE PT VISIT: 1 Visit         Elsie Grieves, PT, DPT [facility] Department Office: [phone]  Elsie Grieves 11/22/2023, 10:29 AM
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
## 2023-11-22 NOTE — Progress Notes (Signed)
 PROGRESS NOTE    Greg [name]  FMW:[id number] DOB: January 28, 1951 DOA: 11/20/2023 PCP: Pcp, No    Brief Narrative:   Greg [name] is a 73 y.o. 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 ED on 11/20/2023 with concerns for GI bleeding.  Patient is a poor historian, lives alone and very private and letting anyone in his house per family.  Denies nausea, no vomiting, no chest pain, no fever/chills, no scrotal pain.  Patient reports the scrotum has been swollen for 1.5 years; unchanged.  In the ED, temperature 98.3 F, HR 97, RR 16, BP 143/92, SpO2 100% in room air.  WBC 4.6, hemoglobin 10.0, platelet count 195.  Sodium 133, potassium 4.5, chloride 97, CO2 24, glucose 97, BUN 11, creatinine 0.72.  Lipase 46.  AST 22, ALT 15, total bilirubin 0.5.  BNP 94.1.  Lactic acid 1.8.  CT abdomen/pelvis with contrast with noted aneurysmal dilation of the abdominal aorta extending to the common iliac arteries with mural thrombus and severe atherosclerotic calcifications, enlarged since prior exam 2023; large right sided inguinal hernia extending to the scrotal sac containing bowel, stable from prior exams without bowel obstruction, prominent collateral veins overlying right scrotal sac.  GI, vascular surgery was consulted.  TRH was consulted for admission for further evaluation and management of concern for recurrent UGIB  Assessment & Plan:   Concern for acute upper GI bleed Hx AVMs s/p APC Hx iron deficiency anemia Hx gastric cancer/Barrett's esophagus Patient presenting with concerns of recurrent GI bleed.  History of AVMs and underwent APC 2023.  Also with known iron deficiency anemia, lost to follow-up after previous hospitalization.  Patient with hemoglobin 10.0 at time of admission.  Anemia panel with iron 16, TIBC 417, ferritin 56, folate 14.4,  vitamin B12 735.  Received IV iron x 1 on 11/21/2023.  Seen by gastroenterology, given stability of hemoglobin no indication for endoscopy at this time. -- [facility] following, appreciate assistance -- Hgb 10.0>8.8>8.8>8.4>8.6, stable -- Protonix  40 mg PO  BID -- Ferrous sulfate 325 mg p.o. daily -- H&H in the am  Hyponatremia Sodium slightly low 133, suspect etiology likely secondary to poor oral intake.  Supported with IV fluid hydration. -- Repeat BMP in a.m.  Abdominal aortic aneurysm CT abdomen/pelvis with noted aneurysmal dilation of the abdominal aorta extending to the common iliac arteries with mural thrombus and severe atherosclerotic calcifications, enlarged since prior exam 2023.  Vascular surgery was consulted, Dr. Pearline reviewed imaging and reported that this is a normal amount of growth over the last 2 years and recommend outpatient follow-up in vascular surgery clinic with repeat CT a abdomen/pelvis -- Outpatient follow-up with vascular surgery  Large right inguinal hernia Has been present with no significant change over the past 1.5 years per patient.  CT abdomen/pelvis on admission with large right sided inguinal hernia extending to the scrotal sac containing bowel, stable from prior exams without obstruction. -- Outpatient follow-up with PCP/general surgery   Chronic systolic/diastolic congestive heart failure with improved LVEF. HTN Not on any home medications.  TTE 12/19/2021 with LVEF 35-40%, LV with moderate decreased function with regional wall motion abnormalities and severe hypokinesis of the mid--apical anteroseptal/inferior septal and anterior walls, grade 2 diastolic dysfunction, IVC dilated.  TTE this admission with LVEF 60 to 65%, grade 1 diastolic dysfunction, trivial MR, IVC dilated. -- BP 118/84 this morning  Weakness/debility/deconditioning -- PT/OT recommend SNF placement -- TOC consulted for placement  Erythema intertrigo, friction dermatitis to  genital/thighs Seen by wound RN Cleanse buttocks/sacrum/ischium and scrotum with Vashe wound cleanser, do not rinse and allow to air dry.  Apply Gerhardt's Butt Cream 3 times a day and prn soiling.  May sprinkle over Gerhardt's with floor stock antifungal powder (white and green label microguard) for extra drying effect.    DVT prophylaxis: SCDs Start: 11/20/23 1921    Code Status: Full Code Family Communication: No family present at bedside this morning  Disposition Plan:  Level of care: Telemetry Status is: Inpatient Remains inpatient appropriate because: Pending SNF placement    Consultants:  [facility], Dr. Abran Vascular surgery, Dr. Pearline  Procedures:  TTE:  Scrotal ultrasound:   Antimicrobials:  None   Subjective: Patient seen examined bedside, lying in bed.  No complaints this morning.  Seen by GI yesterday with no recommendation for endoscopy at this time given stability of hemoglobin.  Seen by therapy with recommendation of SNF placement, patient agreeable.  Patient denies headache, no dizziness, no chest pain, no shortness of breath, no abdominal pain, no fever.  No acute concerns overnight per nursing staff.  Objective: Vitals:   11/21/23 1421 11/21/23 2129 11/22/23 0505 11/22/23 0510  BP: 129/74 (!) 102/90 (!) 188/111 (!) 129/92  Pulse: 80 84 69 83  Resp: 18     Temp: 99.2 F (37.3 C) 99.1 F (37.3 C) 98 F (36.7 C)   TempSrc: Oral Oral    SpO2: 97% 99% 100%   Weight:      Height:        Intake/Output Summary (Last 24 hours) at 11/22/2023 1125 Last data filed at 11/22/2023 0910 Gross per 24 hour  Intake 770.04 ml  Output 950 ml  Net -179.96 ml   Filed Weights   11/20/23 2227  Weight: 49.8 kg    Examination:  Physical Exam: GEN: NAD, alert and oriented x 3, chronically ill/disheveled/malodorous in appearance, appears older than stated age HEENT: NCAT, PERRL, EOMI, sclera clear, dry mucous membranes, poor dentition PULM: CTAB  w/o wheezes/crackles, normal respiratory effort on room air CV: RRR w/o M/G/R GI: abd soft, NTND, + BS GU: Noted significant right sided scrotal edema without erythema/fluctuance/crepitus MSK: no peripheral edema, moves all extremities independently NEURO: No focal neurological deficit PSYCH: normal mood/affect Integumentary: Scrotum, buttock, thigh wounds as depicted below            Data Reviewed: I have personally reviewed following labs and imaging studies  CBC: Recent Labs  Lab 11/20/23 1404 11/21/23 0057 11/21/23 0833 11/21/23 2012 11/22/23 0518  WBC 4.6 3.4*  --   --  3.7*  NEUTROABS 3.4  --   --   --   --   HGB 10.0* 8.8* 8.8* 8.4* 8.6*  HCT 33.2* 29.4* 29.9* 27.7* 29.9*  MCV 83.8 84.0  --   --  85.4  PLT 195 190  --   --  208   Basic Metabolic Panel: Recent Labs  Lab 11/20/23 1404 11/21/23 0057 11/22/23 0518  NA 133* 133* 134*  K 4.5 3.9 3.7  CL 97* 98 101  CO2 24 24 23   GLUCOSE 97 86 95  BUN 11 8 7*  CREATININE 0.72 0.66 0.73  CALCIUM  9.8 9.1 8.8*  MG  --   --  2.3  PHOS  --   --  2.5   GFR: Estimated Creatinine Clearance: 58.8 mL/min (by C-G formula based on SCr  of 0.73 mg/dL). Liver Function Tests: Recent Labs  Lab 11/20/23 1404  AST 22  ALT 15  ALKPHOS 80  BILITOT 0.5  PROT 7.8  ALBUMIN 4.2   Recent Labs  Lab 11/20/23 1404  LIPASE 46   No results for input(s): AMMONIA in the last 168 hours. Coagulation Profile: No results for input(s): INR, PROTIME in the last 168 hours. Cardiac Enzymes: No results for input(s): CKTOTAL, CKMB, CKMBINDEX, TROPONINI in the last 168 hours. BNP (last 3 results) Recent Labs    11/20/23 1404  PROBNP 94.1   HbA1C: No results for input(s): HGBA1C in the last 72 hours. CBG: No results for input(s): GLUCAP in the last 168 hours. Lipid Profile: No results for input(s): CHOL, HDL, LDLCALC, TRIG, CHOLHDL, LDLDIRECT in the last 72 hours. Thyroid Function Tests: Recent  Labs    11/21/23 0057  TSH 1.340   Anemia Panel: Recent Labs    11/21/23 0057  VITAMINB12 735  FOLATE 14.4  FERRITIN 56  TIBC 417  IRON 16*  RETICCTPCT 1.9   Sepsis Labs: Recent Labs  Lab 11/20/23 1418  LATICACIDVEN 1.8    Recent Results (from the past 240 hours)  Resp panel by RT-PCR (RSV, Flu A&B, Covid) Anterior Nasal Swab     Status: None   Collection Time: 11/20/23  7:23 PM   Specimen: Anterior Nasal Swab  Result Value Ref Range Status   SARS Coronavirus 2 by RT PCR NEGATIVE NEGATIVE Final    Comment: (NOTE) SARS-CoV-2 target nucleic acids are NOT DETECTED.  The SARS-CoV-2 RNA is generally detectable in upper respiratory specimens during the acute phase of infection. The lowest concentration of SARS-CoV-2 viral copies this assay can detect is 138 copies/mL. A negative result does not preclude SARS-Cov-2 infection and should not be used as the sole basis for treatment or other patient management decisions. A negative result may occur with  improper specimen collection/handling, submission of specimen other than nasopharyngeal swab, presence of viral mutation(s) within the areas targeted by this assay, and inadequate number of viral copies(<138 copies/mL). A negative result must be combined with clinical observations, patient history, and epidemiological information. The expected result is Negative.  Fact Sheet for Patients:  BloggerCourse.com  Fact Sheet for Healthcare Providers:  SeriousBroker.it  This test is no t yet approved or cleared by the [place]  FDA and  has been authorized for detection and/or diagnosis of SARS-CoV-2 by FDA under an Emergency Use Authorization (EUA). This EUA will remain  in effect (meaning this test can be used) for the duration of the COVID-19 declaration under Section 564(b)(1) of the Act, 21 U.S.C.section 360bbb-3(b)(1), unless the authorization is terminated  or revoked  sooner.       Influenza A by PCR NEGATIVE NEGATIVE Final   Influenza B by PCR NEGATIVE NEGATIVE Final    Comment: (NOTE) The Xpert Xpress SARS-CoV-2/FLU/RSV plus assay is intended as an aid in the diagnosis of influenza from Nasopharyngeal swab specimens and should not be used as a sole basis for treatment. Nasal washings and aspirates are unacceptable for Xpert Xpress SARS-CoV-2/FLU/RSV testing.  Fact Sheet for Patients: BloggerCourse.com  Fact Sheet for Healthcare Providers: SeriousBroker.it  This test is not yet approved or cleared by the [place]  FDA and has been authorized for detection and/or diagnosis of SARS-CoV-2 by FDA under an Emergency Use Authorization (EUA). This EUA will remain in effect (meaning this test can be used) for the duration of the COVID-19 declaration under Section 564(b)(1) of the Act, 21  U.S.C. section 360bbb-3(b)(1), unless the authorization is terminated or revoked.     Resp Syncytial Virus by PCR NEGATIVE NEGATIVE Final    Comment: (NOTE) Fact Sheet for Patients: BloggerCourse.com  Fact Sheet for Healthcare Providers: SeriousBroker.it  This test is not yet approved or cleared by the [place]  FDA and has been authorized for detection and/or diagnosis of SARS-CoV-2 by FDA under an Emergency Use Authorization (EUA). This EUA will remain in effect (meaning this test can be used) for the duration of the COVID-19 declaration under Section 564(b)(1) of the Act, 21 U.S.C. section 360bbb-3(b)(1), unless the authorization is terminated or revoked.  Performed at [facility], [address], [place], [place] [address]          Radiology Studies: VAS US  LOWER EXTREMITY VENOUS (DVT) Result Date: 11/21/2023  Lower Venous DVT Study Patient Name:  GRAE CANNATA  Date of Exam:   11/21/2023 Medical Rec #: [id number]        Accession #:    [id number] Date of Birth: 1950/09/03       Patient Gender: M Patient Age:   25 years Exam Location:  [facility] Procedure:      VAS US  LOWER EXTREMITY VENOUS (DVT) Referring Phys: RAMESH KC --------------------------------------------------------------------------------  Indications: Swelling.  Risk Factors: None identified. Limitations: Poor ultrasound/tissue interface and patient positioning. Comparison Study: No prior studies. Performing Technologist: Cordella Collet RVT  Examination Guidelines: A complete evaluation includes B-mode imaging, spectral Doppler, color Doppler, and power Doppler as needed of all accessible portions of each vessel. Bilateral testing is considered an integral part of a complete examination. Limited examinations for reoccurring indications may be performed as noted. The reflux portion of the exam is performed with the patient in reverse Trendelenburg.  +---------+---------------+---------+-----------+----------+-------------------+ RIGHT    CompressibilityPhasicitySpontaneityPropertiesThrombus Aging      +---------+---------------+---------+-----------+----------+-------------------+ CFV      Full           Yes      Yes                                      +---------+---------------+---------+-----------+----------+-------------------+ SFJ      Full                                                             +---------+---------------+---------+-----------+----------+-------------------+ FV Prox  Full                                                             +---------+---------------+---------+-----------+----------+-------------------+ FV Mid   Full                                                             +---------+---------------+---------+-----------+----------+-------------------+ FV DistalFull                                                              +---------+---------------+---------+-----------+----------+-------------------+  PFV      Full                                                             +---------+---------------+---------+-----------+----------+-------------------+ POP      Full           Yes      Yes                                      +---------+---------------+---------+-----------+----------+-------------------+ PTV      Full                                                             +---------+---------------+---------+-----------+----------+-------------------+ PERO                                                  Not well visualized +---------+---------------+---------+-----------+----------+-------------------+   +---------+---------------+---------+-----------+----------+--------------+ LEFT     CompressibilityPhasicitySpontaneityPropertiesThrombus Aging +---------+---------------+---------+-----------+----------+--------------+ CFV      Full           Yes      Yes                                 +---------+---------------+---------+-----------+----------+--------------+ SFJ      Full                                                        +---------+---------------+---------+-----------+----------+--------------+ FV Prox  Full                                                        +---------+---------------+---------+-----------+----------+--------------+ FV Mid   Full                                                        +---------+---------------+---------+-----------+----------+--------------+ FV DistalFull                                                        +---------+---------------+---------+-----------+----------+--------------+ PFV      Full                                                        +---------+---------------+---------+-----------+----------+--------------+  POP      Full           Yes      Yes                                  +---------+---------------+---------+-----------+----------+--------------+ PTV      Full                                                        +---------+---------------+---------+-----------+----------+--------------+ PERO     Full                                                        +---------+---------------+---------+-----------+----------+--------------+     Summary: RIGHT: - There is no evidence of deep vein thrombosis in the lower extremity. However, portions of this examination were limited- see technologist comments above.  - No cystic structure found in the popliteal fossa.  LEFT: - There is no evidence of deep vein thrombosis in the lower extremity.  - No cystic structure found in the popliteal fossa.  *See table(s) above for measurements and observations. Electronically signed by Gaile New MD on 11/21/2023 at 9:56:41 PM.    Final    US  SCROTUM Result Date: 11/21/2023 CLINICAL DATA:  Swelling. EXAM: SCROTAL ULTRASOUND DOPPLER ULTRASOUND OF THE TESTICLES TECHNIQUE: Complete ultrasound examination of the testicles, epididymis, and other scrotal structures was performed. Color and spectral Doppler ultrasound were also utilized to evaluate blood flow to the testicles. COMPARISON:  CT abdomen pelvis 11/20/2023 FINDINGS: Right testicle Right testicle is not visualized., likely to bowel shadowing. Left testicle Measurements: 3.9 x 2 x 3.8 cm. No mass or microlithiasis visualized. Right epididymis:  Not visualized. Left epididymis:  Normal in size and appearance. Hydrocele: Small left hydrocele is present. Scrotal calcifications are present. Varicocele:  None visualized. Pulsed Doppler interrogation demonstrates normal low resistance arterial and venous waveforms in the visualized left testis. Other: There is a large scrotal hernia likely arising from the right and containing bowel with bowel shadowing and peristalsis visualized. See CT report. IMPRESSION: 1. Large scrotal hernia  containing multiple loops of bowel. See prior CT report. 2. Right testis is not visualized due to bowel shadowing. 3. Normal appearance of the left testis.  Small left hydrocele. Electronically Signed   By: Elsie Gravely M.D.   On: 11/21/2023 17:34   ECHOCARDIOGRAM COMPLETE Result Date: 11/21/2023    ECHOCARDIOGRAM REPORT   Patient Name:   GOLDMAN BIRCHALL Date of Exam: 11/21/2023 Medical Rec #:  [id number]      Height:       68.0 in Accession #:    [id number]     Weight:       109.8 lb Date of Birth:  [birth year]      BSA:          1.584 m Patient Age:    72 years       BP:           114/80 mmHg Patient Gender: M              HR:  76 bpm. Exam Location:  Inpatient Procedure: 2D Echo, Cardiac Doppler and Color Doppler (Both Spectral and Color            Flow Doppler were utilized during procedure). Indications:    I50.40* Unspecified combined systolic (congestive) and diastolic                 (congestive) heart failure  History:        Patient has prior history of Echocardiogram examinations, most                 recent 12/19/2021. CHF, Previous Myocardial Infarction,                 Signs/Symptoms:Dyspnea and Shortness of Breath; Risk                 Factors:Current Smoker, Dyslipidemia and Hypertension.  Sonographer:    Ellouise Mose RDCS Referring Phys: [id number] [facility]  Sonographer Comments: Technically difficult study due to poor echo windows, suboptimal parasternal window and suboptimal apical window. Image acquisition challenging due to respiratory motion. Patient moving throughout exam. Parasternal images extremely low. IMPRESSIONS  1. Left ventricular ejection fraction, by estimation, is 60 to 65%. The left ventricle has normal function. The left ventricle has no regional wall motion abnormalities. Left ventricular diastolic parameters are consistent with Grade I diastolic dysfunction (impaired relaxation).  2. Right ventricular systolic function is normal. The right ventricular size is normal.  Tricuspid regurgitation signal is inadequate for assessing PA pressure. The estimated right ventricular systolic pressure is 36.1 mmHg.  3. The mitral valve is normal in structure. Trivial mitral valve regurgitation. No evidence of mitral stenosis.  4. The aortic valve is normal in structure. Aortic valve regurgitation is not visualized. No aortic stenosis is present.  5. Ascending aorta not well visualized.  6. The inferior vena cava is dilated in size with >50% respiratory variability, suggesting right atrial pressure of 8 mmHg.  7. Cannot exclude a small PFO. Comparison(s): Changes from prior study are noted. The left ventricular function has improved. FINDINGS  Left Ventricle: Left ventricular ejection fraction, by estimation, is 60 to 65%. The left ventricle has normal function. The left ventricle has no regional wall motion abnormalities. The left ventricular internal cavity size was normal in size. There is  no left ventricular hypertrophy. Left ventricular diastolic parameters are consistent with Grade I diastolic dysfunction (impaired relaxation). Right Ventricle: The right ventricular size is normal. No increase in right ventricular wall thickness. Right ventricular systolic function is normal. Tricuspid regurgitation signal is inadequate for assessing PA pressure. The tricuspid regurgitant velocity is 2.65 m/s, and with an assumed right atrial pressure of 8 mmHg, the estimated right ventricular systolic pressure is 36.1 mmHg. Left Atrium: Left atrial size was normal in size. Right Atrium: Right atrial size was normal in size. Pericardium: There is no evidence of pericardial effusion. Mitral Valve: The mitral valve is normal in structure. Trivial mitral valve regurgitation. No evidence of mitral valve stenosis. Tricuspid Valve: The tricuspid valve is normal in structure. Tricuspid valve regurgitation is trivial. No evidence of tricuspid stenosis. Aortic Valve: The aortic valve is normal in structure.  Aortic valve regurgitation is not visualized. No aortic stenosis is present. Pulmonic Valve: The pulmonic valve was not well visualized. Pulmonic valve regurgitation is not visualized. No evidence of pulmonic stenosis. Aorta: Ascending aorta not well visualized. The aortic root is normal in size and structure. Venous: The inferior vena cava is dilated in size with greater than  50% respiratory variability, suggesting right atrial pressure of 8 mmHg. IAS/Shunts: Cannot exclude a small PFO.  LEFT VENTRICLE PLAX 2D LVIDd:         3.70 cm     Diastology LVIDs:         2.40 cm     LV e' medial:    7.40 cm/s LV PW:         0.70 cm     LV E/e' medial:  11.1 LV IVS:        0.70 cm     LV e' lateral:   6.74 cm/s LVOT diam:     2.40 cm     LV E/e' lateral: 12.2 LV SV:         70 LV SV Index:   44 LVOT Area:     4.52 cm  LV Volumes (MOD) LV vol d, MOD A2C: 36.4 ml LV vol d, MOD A4C: 48.3 ml LV vol s, MOD A2C: 16.3 ml LV vol s, MOD A4C: 18.6 ml LV SV MOD A2C:     20.1 ml LV SV MOD A4C:     48.3 ml LV SV MOD BP:      24.5 ml RIGHT VENTRICLE             IVC RV S prime:     12.30 cm/s  IVC diam: 2.30 cm TAPSE (M-mode): 1.5 cm LEFT ATRIUM             Index        RIGHT ATRIUM          Index LA diam:        2.60 cm 1.64 cm/m   RA Area:     9.21 cm LA Vol (A2C):   6.7 ml  4.26 ml/m   RA Volume:   17.50 ml 11.05 ml/m LA Vol (A4C):   17.9 ml 11.30 ml/m LA Biplane Vol: 12.0 ml 7.58 ml/m  AORTIC VALVE LVOT Vmax:   89.70 cm/s LVOT Vmean:  56.350 cm/s LVOT VTI:    0.156 m  AORTA Ao Root diam: 3.30 cm MITRAL VALVE               TRICUSPID VALVE MV Area (PHT): 4.49 cm    TR Peak grad:   28.1 mmHg MV Decel Time: 169 msec    TR Vmax:        265.00 cm/s MV E velocity: 82.05 cm/s MV A velocity: 83.15 cm/s  SHUNTS MV E/A ratio:  0.99        Systemic VTI:  0.16 m                            Systemic Diam: 2.40 cm Shelda Bruckner MD Electronically signed by Shelda Bruckner MD Signature Date/Time: 11/21/2023/2:31:19 PM    Final     CT ABDOMEN PELVIS W CONTRAST Result Date: 11/20/2023 CLINICAL DATA:  Sepsis EXAM: CT ABDOMEN AND PELVIS WITH CONTRAST TECHNIQUE: Multidetector CT imaging of the abdomen and pelvis was performed using the standard protocol following bolus administration of intravenous contrast. RADIATION DOSE REDUCTION: This exam was performed according to the departmental dose-optimization program which includes automated exposure control, adjustment of the mA and/or kV according to patient size and/or use of iterative reconstruction technique. CONTRAST:  OMNIPAQUE  IOHEXOL  300 MG/ML  SOLN COMPARISON:  CT angio chest abdomen pelvis December 18, 2021 FINDINGS: Lower chest: No acute abnormality. Hepatobiliary: No focal liver abnormality is seen.  No gallstones, gallbladder wall thickening, or biliary dilatation. Pancreas: Unremarkable. No pancreatic ductal dilatation or surrounding inflammatory changes. Spleen: Normal in size without focal abnormality. Adrenals/Urinary Tract: Adrenal glands are unremarkable. Kidneys are normal, without renal calculi, focal lesion, or hydronephrosis. Bladder is unremarkable. Stomach/Bowel: Stomach is within normal limits. Appendix appears normal. See below for malposition of right hemicolon within the scrotal sac hernia. Large stool throughout the rectum. Otherwise no evidence of bowel wall thickening, distention, or inflammatory changes. Vascular/Lymphatic: Atherosclerotic calcifications of aorta and branches with severe tortuosity. 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 No enlarged abdominal or pelvic lymph nodes. Bilateral prominent inguinal lymph nodes measuring  up to 11 mm. Reproductive: Large right inguinal hernia scrotal sac containing colon (right hemicolon and small-bowel) and fat measuring approximately 16 x 12 cm. No bowel obstruction identified. Superficial collateral vessels are identified overlying the right scrotal sac without active bleeding. Small hydrocele.  Moderate prostatomegaly. Other: No abdominopelvic ascites. Musculoskeletal: No acute or significant osseous findings. Degenerative changes of the spine. 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 Recommend follow-up to ensure stability. Large right sided inguinal hernia extending to the scrotal sac containing bowel, grossly stable to prior. No bowel obstruction. Prominent collateral veins overlying the right scrotal sac. Electronically Signed   By: Megan  Zare M.D.   On: 11/20/2023 17:16        Scheduled Meds:  Gerhardt's butt cream   Topical TID   pantoprazole  (PROTONIX ) IV  40 mg Intravenous Q12H   Continuous Infusions:  sodium chloride  40 mL/hr at 11/21/23 1320     LOS: 1 day    Time spent: 52 minutes spent on 11/22/2023 caring for this patient face-to-face including chart review, ordering labs/tests, documenting, discussion with nursing staff, consultants, updating family and interview/physical exam    Camellia PARAS [place], DO [facility] Available via Epic secure chat 7am-7pm After these hours, please refer to coverage provider listed on [email or web address] 11/22/2023, 11:25 AM

## 2023-11-22 NOTE — Plan of Care (Signed)
  Problem: Pain Managment: Goal: General experience of comfort will improve and/or be controlled Outcome: Progressing

## 2023-11-23 DIAGNOSIS — K922 Gastrointestinal hemorrhage, unspecified: Secondary | ICD-10-CM | POA: Diagnosis not present

## 2023-11-23 LAB — HEMOGLOBIN AND HEMATOCRIT, BLOOD
HCT: 30 % — ABNORMAL LOW (ref 39.0–52.0)
Hemoglobin: 8.4 g/dL — ABNORMAL LOW (ref 13.0–17.0)

## 2023-11-23 NOTE — Progress Notes (Signed)
Assumed care of pt from off going RN. No changes in initial am assessment.Cont with plan of care

## 2023-11-23 NOTE — Plan of Care (Signed)

## 2023-11-23 NOTE — Progress Notes (Signed)
 PROGRESS NOTE    Greg [name]  FMW:[id number] DOB: [birth year] DOA: 11/20/2023 PCP: Pcp, No    Brief Narrative:   Greg [name] is a 73 y.o. 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  Patient is a poor historian, lives alone and very private and letting anyone in his house per family.  Denies nausea, no vomiting, no chest pain, no fever/chills, no scrotal pain.  Patient reports the scrotum has been swollen for 1.5 years; unchanged.  In the ED, temperature 98.3 F, HR 97, RR 16, BP 143/92, SpO2 100% in room air.  WBC 4.6, hemoglobin 10.0, platelet count 195.  Sodium 133, potassium 4.5, chloride 97, CO2 24, glucose 97, BUN 11, creatinine 0.72.  Lipase 46.  AST 22, ALT 15, total bilirubin 0.5.  BNP 94.1.  Lactic acid 1.8.  CT abdomen/pelvis with contrast with noted aneurysmal dilation of the abdominal aorta extending to the common iliac arteries with mural thrombus and severe atherosclerotic calcifications, enlarged since prior exam 2023; large right sided inguinal hernia extending to the scrotal sac containing bowel, stable from prior exams without bowel obstruction, prominent collateral veins overlying right scrotal sac.  GI, vascular surgery was consulted.  TRH was consulted for admission for further evaluation and management of concern for recurrent UGIB  Assessment & Plan:   Concern for acute upper GI bleed Hx AVMs s/p APC Hx iron deficiency anemia Hx gastric cancer/Barrett's esophagus Patient presenting with concerns of recurrent GI bleed.  History of AVMs and underwent APC 2023.  Also with known iron deficiency anemia, lost to follow-up after previous hospitalization.  Patient with hemoglobin 10.0 at time of admission.  Anemia panel with iron 16, TIBC 417, ferritin 56, folate 14.4,  vitamin B12 735.  Received IV iron x 1 on 11/21/2023.  Seen by gastroenterology, given stability of hemoglobin no indication for endoscopy at this time. -- [facility] following, appreciate assistance -- Hgb 10.0>8.8>8.8>8.4>8.6>8.4, stable -- Protonix  40 mg PO  BID -- Ferrous sulfate 325 mg p.o. daily  Hyponatremia Sodium slightly low 133, suspect etiology likely secondary to poor oral intake.  Supported with IV fluid hydration. -- Continue to encourage increased oral intake  Abdominal aortic aneurysm CT abdomen/pelvis with noted aneurysmal dilation of the abdominal aorta extending to the common iliac arteries with mural thrombus and severe atherosclerotic calcifications, enlarged since prior exam 2023.  Vascular surgery was consulted, Dr. Pearline reviewed imaging and reported that this is a normal amount of growth over the last 2 years and recommend outpatient follow-up in vascular surgery clinic with repeat CT a abdomen/pelvis -- Outpatient follow-up with vascular surgery  Large right inguinal hernia Has been present with no significant change over the past 1.5 years per patient.  CT abdomen/pelvis on admission with large right sided inguinal hernia extending to the scrotal sac containing bowel, stable from prior exams without obstruction. -- Outpatient follow-up with PCP/general surgery   Chronic systolic/diastolic congestive heart failure with improved LVEF. HTN Not on any home medications.  TTE 12/19/2021 with LVEF 35-40%, LV with moderate decreased function with regional wall motion abnormalities and severe hypokinesis of the mid--apical anteroseptal/inferior septal and anterior walls, grade 2 diastolic dysfunction, IVC dilated.  TTE this admission with LVEF 60 to 65%, grade 1 diastolic dysfunction, trivial MR, IVC dilated. 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  Apply Gerhardt's Butt Cream 3 times a day and prn soiling.  May sprinkle over Gerhardt's with floor stock antifungal powder (white and green label microguard) for extra drying effect.    DVT prophylaxis: SCDs Start: 11/20/23 1921    Code Status: Full Code Family Communication: No family present at bedside this morning  Disposition Plan:  Level of care: Telemetry Status is: Inpatient Remains inpatient appropriate because: Pending SNF placement    Consultants:  [place] gastroenterology, Dr. Abran Vascular surgery, Dr. Pearline  Procedures:  TTE:  Scrotal ultrasound:   Antimicrobials:  None   Subjective: Patient seen examined bedside, lying in bed.  No complaints this morning.  Patient denies headache, no dizziness, no chest pain, no shortness of breath, no abdominal pain, no fever.  No acute concerns overnight per nursing staff.  Medically stable to discharge to SNF once bed available and insurance authorization  Objective: Vitals:   11/22/23 0510 11/22/23 1302 11/22/23 2058 11/23/23 0544  BP: (!) 129/92 112/72 116/81 108/67  Pulse: 83 78 80 80  Resp:  19 18 16   Temp:  98.4 F (36.9 C) 98.6 F (37 C) 98.9 F (37.2 C)  TempSrc:  Oral Oral Oral  SpO2:  99% 97% 96%  Weight:      Height:        Intake/Output Summary (Last 24 hours) at 11/23/2023 1022 Last data filed at 11/23/2023 0900 Gross per 24 hour  Intake 480 ml  Output 1450 ml  Net -970 ml   Filed Weights   11/20/23 2227  Weight: 49.8 kg    Examination:  Physical Exam: GEN: NAD, alert and oriented x 3, chronically ill/disheveled/malodorous in appearance, appears older than stated age HEENT: NCAT, PERRL, EOMI, sclera clear, dry mucous membranes, poor dentition PULM: CTAB w/o wheezes/crackles, normal respiratory effort on room air CV: RRR w/o M/G/R GI:  abd soft, NTND, + BS GU: Noted significant right sided scrotal edema without erythema/fluctuance/crepitus MSK: no peripheral edema, moves all extremities independently NEURO: No focal neurological deficit PSYCH: normal mood/affect Integumentary: Scrotum, buttock, thigh wounds as depicted below            Data Reviewed: I have personally reviewed following labs and imaging studies  CBC: Recent Labs  Lab 11/20/23 1404 11/21/23 0057 11/21/23 0833 11/21/23 2012 11/22/23 0518 11/23/23 0739  WBC 4.6 3.4*  --   --  3.7*  --   NEUTROABS 3.4  --   --   --   --   --   HGB 10.0* 8.8* 8.8* 8.4* 8.6* 8.4*  HCT 33.2* 29.4* 29.9* 27.7* 29.9* 30.0*  MCV 83.8 84.0  --   --  85.4  --   PLT 195 190  --   --  208  --    Basic Metabolic Panel: Recent Labs  Lab 11/20/23 1404 11/21/23 0057 11/22/23 0518  NA 133* 133* 134*  K 4.5 3.9 3.7  CL 97* 98 101  CO2 24 24 23   GLUCOSE 97 86 95  BUN 11 8 7*  CREATININE 0.72 0.66 0.73  CALCIUM  9.8 9.1 8.8*  MG  --   --  2.3  PHOS  --   --  2.5   GFR: Estimated Creatinine Clearance: 58.8 mL/min (by C-G formula based on SCr of 0.73 mg/dL). Liver  Function Tests: Recent Labs  Lab 11/20/23 1404  AST 22  ALT 15  ALKPHOS 80  BILITOT 0.5  PROT 7.8  ALBUMIN 4.2   Recent Labs  Lab 11/20/23 1404  LIPASE 46   No results for input(s): AMMONIA in the last 168 hours. Coagulation Profile: No results for input(s): INR, PROTIME in the last 168 hours. Cardiac Enzymes: No results for input(s): CKTOTAL, CKMB, CKMBINDEX, TROPONINI in the last 168 hours. BNP (last 3 results) Recent Labs    11/20/23 1404  PROBNP 94.1   HbA1C: No results for input(s): HGBA1C in the last 72 hours. CBG: No results for input(s): GLUCAP in the last 168 hours. Lipid Profile: No results for input(s): CHOL, HDL, LDLCALC, TRIG, CHOLHDL, LDLDIRECT in the last 72 hours. Thyroid Function Tests: Recent Labs    11/21/23 0057  TSH 1.340    Anemia Panel: Recent Labs    11/21/23 0057  VITAMINB12 735  FOLATE 14.4  FERRITIN 56  TIBC 417  IRON 16*  RETICCTPCT 1.9   Sepsis Labs: Recent Labs  Lab 11/20/23 1418  LATICACIDVEN 1.8    Recent Results (from the past 240 hours)  Resp panel by RT-PCR (RSV, Flu A&B, Covid) Anterior Nasal Swab     Status: None   Collection Time: 11/20/23  7:23 PM   Specimen: Anterior Nasal Swab  Result Value Ref Range Status   SARS Coronavirus 2 by RT PCR NEGATIVE NEGATIVE Final    Comment: (NOTE) SARS-CoV-2 target nucleic acids are NOT DETECTED.  The SARS-CoV-2 RNA is generally detectable in upper respiratory specimens during the acute phase of infection. The lowest concentration of SARS-CoV-2 viral copies this assay can detect is 138 copies/mL. A negative result does not preclude SARS-Cov-2 infection and should not be used as the sole basis for treatment or other patient management decisions. A negative result may occur with  improper specimen collection/handling, submission of specimen other than nasopharyngeal swab, presence of viral mutation(s) within the areas targeted by this assay, and inadequate number of viral copies(<138 copies/mL). A negative result must be combined with clinical observations, patient history, and epidemiological information. The expected result is Negative.  Fact Sheet for Patients:  BloggerCourse.com  Fact Sheet for Healthcare Providers:  SeriousBroker.it  This test is no t yet approved or cleared by the [place]  FDA and  has been authorized for detection and/or diagnosis of SARS-CoV-2 by FDA under an Emergency Use Authorization (EUA). This EUA will remain  in effect (meaning this test can be used) for the duration of the COVID-19 declaration under Section 564(b)(1) of the Act, 21 U.S.C.section 360bbb-3(b)(1), unless the authorization is terminated  or revoked sooner.       Influenza A by PCR  NEGATIVE NEGATIVE Final   Influenza B by PCR NEGATIVE NEGATIVE Final    Comment: (NOTE) The Xpert Xpress SARS-CoV-2/FLU/RSV plus assay is intended as an aid in the diagnosis of influenza from Nasopharyngeal swab specimens and should not be used as a sole basis for treatment. Nasal washings and aspirates are unacceptable for Xpert Xpress SARS-CoV-2/FLU/RSV testing.  Fact Sheet for Patients: BloggerCourse.com  Fact Sheet for Healthcare Providers: SeriousBroker.it  This test is not yet approved or cleared by the [place]  FDA and has been authorized for detection and/or diagnosis of SARS-CoV-2 by FDA under an Emergency Use Authorization (EUA). This EUA will remain in effect (meaning this test can be used) for the duration of the COVID-19 declaration under Section 564(b)(1) of the Act, 21 U.S.C. section 360bbb-3(b)(1), unless  the authorization is terminated or revoked.     Resp Syncytial Virus by PCR NEGATIVE NEGATIVE Final    Comment: (NOTE) Fact Sheet for Patients: BloggerCourse.com  Fact Sheet for Healthcare Providers: SeriousBroker.it  This test is not yet approved or cleared by the [place]  FDA and has been authorized for detection and/or diagnosis of SARS-CoV-2 by FDA under an Emergency Use Authorization (EUA). This EUA will remain in effect (meaning this test can be used) for the duration of the COVID-19 declaration under Section 564(b)(1) of the Act, 21 U.S.C. section 360bbb-3(b)(1), unless the authorization is terminated or revoked.  Performed at [facility], [address], [place], [place] [address]          Radiology Studies: VAS US  LOWER EXTREMITY VENOUS (DVT) Result Date: 11/21/2023  Lower Venous DVT Study Patient Name:  HESHAM WOMAC  Date of Exam:   11/21/2023 Medical Rec #: [id number]       Accession #:    [id number] Date of  Birth: September 24, 1950       Patient Gender: M Patient Age:   39 years Exam Location:  [facility] Procedure:      VAS US  LOWER EXTREMITY VENOUS (DVT) Referring Phys: RAMESH KC --------------------------------------------------------------------------------  Indications: Swelling.  Risk Factors: None identified. Limitations: Poor ultrasound/tissue interface and patient positioning. Comparison Study: No prior studies. Performing Technologist: Cordella Collet RVT  Examination Guidelines: A complete evaluation includes B-mode imaging, spectral Doppler, color Doppler, and power Doppler as needed of all accessible portions of each vessel. Bilateral testing is considered an integral part of a complete examination. Limited examinations for reoccurring indications may be performed as noted. The reflux portion of the exam is performed with the patient in reverse Trendelenburg.  +---------+---------------+---------+-----------+----------+-------------------+ RIGHT    CompressibilityPhasicitySpontaneityPropertiesThrombus Aging      +---------+---------------+---------+-----------+----------+-------------------+ CFV      Full           Yes      Yes                                      +---------+---------------+---------+-----------+----------+-------------------+ SFJ      Full                                                             +---------+---------------+---------+-----------+----------+-------------------+ FV Prox  Full                                                             +---------+---------------+---------+-----------+----------+-------------------+ FV Mid   Full                                                             +---------+---------------+---------+-----------+----------+-------------------+ FV DistalFull                                                             +---------+---------------+---------+-----------+----------+-------------------+  PFV       Full                                                             +---------+---------------+---------+-----------+----------+-------------------+ POP      Full           Yes      Yes                                      +---------+---------------+---------+-----------+----------+-------------------+ PTV      Full                                                             +---------+---------------+---------+-----------+----------+-------------------+ PERO                                                  Not well visualized +---------+---------------+---------+-----------+----------+-------------------+   +---------+---------------+---------+-----------+----------+--------------+ LEFT     CompressibilityPhasicitySpontaneityPropertiesThrombus Aging +---------+---------------+---------+-----------+----------+--------------+ CFV      Full           Yes      Yes                                 +---------+---------------+---------+-----------+----------+--------------+ SFJ      Full                                                        +---------+---------------+---------+-----------+----------+--------------+ FV Prox  Full                                                        +---------+---------------+---------+-----------+----------+--------------+ FV Mid   Full                                                        +---------+---------------+---------+-----------+----------+--------------+ FV DistalFull                                                        +---------+---------------+---------+-----------+----------+--------------+ PFV      Full                                                        +---------+---------------+---------+-----------+----------+--------------+  POP      Full           Yes      Yes                                 +---------+---------------+---------+-----------+----------+--------------+ PTV      Full                                                         +---------+---------------+---------+-----------+----------+--------------+ PERO     Full                                                        +---------+---------------+---------+-----------+----------+--------------+     Summary: RIGHT: - There is no evidence of deep vein thrombosis in the lower extremity. However, portions of this examination were limited- see technologist comments above.  - No cystic structure found in the popliteal fossa.  LEFT: - There is no evidence of deep vein thrombosis in the lower extremity.  - No cystic structure found in the popliteal fossa.  *See table(s) above for measurements and observations. Electronically signed by Gaile New MD on 11/21/2023 at 9:56:41 PM.    Final    US  SCROTUM Result Date: 11/21/2023 CLINICAL DATA:  Swelling. EXAM: SCROTAL ULTRASOUND DOPPLER ULTRASOUND OF THE TESTICLES TECHNIQUE: Complete ultrasound examination of the testicles, epididymis, and other scrotal structures was performed. Color and spectral Doppler ultrasound were also utilized to evaluate blood flow to the testicles. COMPARISON:  CT abdomen pelvis 11/20/2023 FINDINGS: Right testicle Right testicle is not visualized., likely to bowel shadowing. Left testicle Measurements: 3.9 x 2 x 3.8 cm. No mass or microlithiasis visualized. Right epididymis:  Not visualized. Left epididymis:  Normal in size and appearance. Hydrocele: Small left hydrocele is present. Scrotal calcifications are present. Varicocele:  None visualized. Pulsed Doppler interrogation demonstrates normal low resistance arterial and venous waveforms in the visualized left testis. Other: There is a large scrotal hernia likely arising from the right and containing bowel with bowel shadowing and peristalsis visualized. See CT report. IMPRESSION: 1. Large scrotal hernia containing multiple loops of bowel. See prior CT report. 2. Right testis is not visualized due to  bowel shadowing. 3. Normal appearance of the left testis.  Small left hydrocele. Electronically Signed   By: Elsie Gravely M.D.   On: 11/21/2023 17:34   ECHOCARDIOGRAM COMPLETE Result Date: 11/21/2023    ECHOCARDIOGRAM REPORT   Patient Name:   ELIJAN GOOGE Date of Exam: 11/21/2023 Medical Rec #:  [id number]      Height:       68.0 in Accession #:    [id number]     Weight:       109.8 lb Date of Birth:  05-30-1950      BSA:          1.584 m Patient Age:    72 years       BP:           114/80 mmHg Patient Gender: M              HR:  76 bpm. Exam Location:  Inpatient Procedure: 2D Echo, Cardiac Doppler and Color Doppler (Both Spectral and Color            Flow Doppler were utilized during procedure). Indications:    I50.40* Unspecified combined systolic (congestive) and diastolic                 (congestive) heart failure  History:        Patient has prior history of Echocardiogram examinations, most                 recent 12/19/2021. CHF, Previous Myocardial Infarction,                 Signs/Symptoms:Dyspnea and Shortness of Breath; Risk                 Factors:Current Smoker, Dyslipidemia and Hypertension.  Sonographer:    Ellouise Mose RDCS Referring Phys: [id number] [facility]  Sonographer Comments: Technically difficult study due to poor echo windows, suboptimal parasternal window and suboptimal apical window. Image acquisition challenging due to respiratory motion. Patient moving throughout exam. Parasternal images extremely low. IMPRESSIONS  1. Left ventricular ejection fraction, by estimation, is 60 to 65%. The left ventricle has normal function. The left ventricle has no regional wall motion abnormalities. Left ventricular diastolic parameters are consistent with Grade I diastolic dysfunction (impaired relaxation).  2. Right ventricular systolic function is normal. The right ventricular size is normal. Tricuspid regurgitation signal is inadequate for assessing PA pressure. The estimated right  ventricular systolic pressure is 36.1 mmHg.  3. The mitral valve is normal in structure. Trivial mitral valve regurgitation. No evidence of mitral stenosis.  4. The aortic valve is normal in structure. Aortic valve regurgitation is not visualized. No aortic stenosis is present.  5. Ascending aorta not well visualized.  6. The inferior vena cava is dilated in size with >50% respiratory variability, suggesting right atrial pressure of 8 mmHg.  7. Cannot exclude a small PFO. Comparison(s): Changes from prior study are noted. The left ventricular function has improved. FINDINGS  Left Ventricle: Left ventricular ejection fraction, by estimation, is 60 to 65%. The left ventricle has normal function. The left ventricle has no regional wall motion abnormalities. The left ventricular internal cavity size was normal in size. There is  no left ventricular hypertrophy. Left ventricular diastolic parameters are consistent with Grade I diastolic dysfunction (impaired relaxation). Right Ventricle: The right ventricular size is normal. No increase in right ventricular wall thickness. Right ventricular systolic function is normal. Tricuspid regurgitation signal is inadequate for assessing PA pressure. The tricuspid regurgitant velocity is 2.65 m/s, and with an assumed right atrial pressure of 8 mmHg, the estimated right ventricular systolic pressure is 36.1 mmHg. Left Atrium: Left atrial size was normal in size. Right Atrium: Right atrial size was normal in size. Pericardium: There is no evidence of pericardial effusion. Mitral Valve: The mitral valve is normal in structure. Trivial mitral valve regurgitation. No evidence of mitral valve stenosis. Tricuspid Valve: The tricuspid valve is normal in structure. Tricuspid valve regurgitation is trivial. No evidence of tricuspid stenosis. Aortic Valve: The aortic valve is normal in structure. Aortic valve regurgitation is not visualized. No aortic stenosis is present. Pulmonic Valve: The  pulmonic valve was not well visualized. Pulmonic valve regurgitation is not visualized. No evidence of pulmonic stenosis. Aorta: Ascending aorta not well visualized. The aortic root is normal in size and structure. Venous: The inferior vena cava is dilated in size with greater than  50% respiratory variability, suggesting right atrial pressure of 8 mmHg. IAS/Shunts: Cannot exclude a small PFO.  LEFT VENTRICLE PLAX 2D LVIDd:         3.70 cm     Diastology LVIDs:         2.40 cm     LV e' medial:    7.40 cm/s LV PW:         0.70 cm     LV E/e' medial:  11.1 LV IVS:        0.70 cm     LV e' lateral:   6.74 cm/s LVOT diam:     2.40 cm     LV E/e' lateral: 12.2 LV SV:         70 LV SV Index:   44 LVOT Area:     4.52 cm  LV Volumes (MOD) LV vol d, MOD A2C: 36.4 ml LV vol d, MOD A4C: 48.3 ml LV vol s, MOD A2C: 16.3 ml LV vol s, MOD A4C: 18.6 ml LV SV MOD A2C:     20.1 ml LV SV MOD A4C:     48.3 ml LV SV MOD BP:      24.5 ml RIGHT VENTRICLE             IVC RV S prime:     12.30 cm/s  IVC diam: 2.30 cm TAPSE (M-mode): 1.5 cm LEFT ATRIUM             Index        RIGHT ATRIUM          Index LA diam:        2.60 cm 1.64 cm/m   RA Area:     9.21 cm LA Vol (A2C):   6.7 ml  4.26 ml/m   RA Volume:   17.50 ml 11.05 ml/m LA Vol (A4C):   17.9 ml 11.30 ml/m LA Biplane Vol: 12.0 ml 7.58 ml/m  AORTIC VALVE LVOT Vmax:   89.70 cm/s LVOT Vmean:  56.350 cm/s LVOT VTI:    0.156 m  AORTA Ao Root diam: 3.30 cm MITRAL VALVE               TRICUSPID VALVE MV Area (PHT): 4.49 cm    TR Peak grad:   28.1 mmHg MV Decel Time: 169 msec    TR Vmax:        265.00 cm/s MV E velocity: 82.05 cm/s MV A velocity: 83.15 cm/s  SHUNTS MV E/A ratio:  0.99        Systemic VTI:  0.16 m                            Systemic Diam: 2.40 cm Shelda Bruckner MD Electronically signed by Shelda Bruckner MD Signature Date/Time: 11/21/2023/2:31:19 PM    Final         Scheduled Meds:  ferrous sulfate  325 mg Oral Q breakfast   Gerhardt's butt cream    Topical TID   pantoprazole   40 mg Oral BID   Continuous Infusions:     LOS: 2 days    Time spent: 40 minutes spent on 11/23/2023 caring for this patient face-to-face including chart review, ordering labs/tests, documenting, discussion with nursing staff, consultants, updating family and interview/physical exam    Camellia PARAS [place], DO [facility] Available via Epic secure chat 7am-7pm After these hours, please refer to coverage provider listed on [email or web address] 11/23/2023, 10:22 AM

## 2023-11-23 NOTE — Plan of Care (Signed)
   Problem: Health Behavior/Discharge Planning: Goal: Ability to manage health-related needs will improve Outcome: Progressing

## 2023-11-23 NOTE — Progress Notes (Signed)
   11/23/23 2253  BiPAP/CPAP/SIPAP  $ Non-Invasive Home Ventilator  Initial  $ Face Mask Small Yes  BiPAP/CPAP/SIPAP Pt Type Adult  BiPAP/CPAP/SIPAP Resmed  Mask Type Full face mask  Dentures removed? Not applicable  Mask Size Small  Respiratory Rate 20 breaths/min  Flow Rate 2 lpm  Patient Home Machine No  Patient Home Mask No  Patient Home Tubing No  Auto Titrate Yes  Minimum cmH2O 5 cmH2O  Maximum cmH2O 20 cmH2O  Nasal massage performed Yes  CPAP/SIPAP surface wiped down Yes  Device Plugged into RED Power Outlet Yes  BiPAP/CPAP /SiPAP Vitals  Pulse Rate 82  Resp 20  SpO2 97 %  Bilateral Breath Sounds Clear

## 2023-11-24 DIAGNOSIS — K922 Gastrointestinal hemorrhage, unspecified: Secondary | ICD-10-CM | POA: Diagnosis not present

## 2023-11-24 NOTE — Progress Notes (Signed)
 PROGRESS NOTE    Greg [name]  FMW:[id number] DOB: [birth year] DOA: 11/20/2023 PCP: Pcp, No    Brief Narrative:   Greg [name] is a 73 y.o. 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  Patient is a poor historian, lives alone and very private and letting anyone in his house per family.  Denies nausea, no vomiting, no chest pain, no fever/chills, no scrotal pain.  Patient reports the scrotum has been swollen for 1.5 years; unchanged.  In the ED, temperature 98.3 F, HR 97, RR 16, BP 143/92, SpO2 100% in room air.  WBC 4.6, hemoglobin 10.0, platelet count 195.  Sodium 133, potassium 4.5, chloride 97, CO2 24, glucose 97, BUN 11, creatinine 0.72.  Lipase 46.  AST 22, ALT 15, total bilirubin 0.5.  BNP 94.1.  Lactic acid 1.8.  CT abdomen/pelvis with contrast with noted aneurysmal dilation of the abdominal aorta extending to the common iliac arteries with mural thrombus and severe atherosclerotic calcifications, enlarged since prior exam 2023; large right sided inguinal hernia extending to the scrotal sac containing bowel, stable from prior exams without bowel obstruction, prominent collateral veins overlying right scrotal sac.  GI, vascular surgery was consulted.  TRH was consulted for admission for further evaluation and management of concern for recurrent UGIB  Assessment & Plan:   Concern for acute upper GI bleed Hx AVMs s/p APC Hx iron deficiency anemia Hx gastric cancer/Barrett's esophagus Patient presenting with concerns of recurrent GI bleed.  History of AVMs and underwent APC 2023.  Also with known iron deficiency anemia, lost to follow-up after previous hospitalization.  Patient with hemoglobin 10.0 at time of admission.  Anemia panel with iron 16, TIBC 417, ferritin 56, folate 14.4,  vitamin B12 735.  Received IV iron x 1 on 11/21/2023.  Seen by gastroenterology, given stability of hemoglobin no indication for endoscopy at this time. -- [facility] following, appreciate assistance -- Hgb 10.0>8.8>8.8>8.4>8.6>8.4, stable -- Protonix  40 mg PO  BID -- Ferrous sulfate 325 mg p.o. daily  Hyponatremia Sodium slightly low 133, suspect etiology likely secondary to poor oral intake.  Supported with IV fluid hydration. -- Continue to encourage increased oral intake  Abdominal aortic aneurysm CT abdomen/pelvis with noted aneurysmal dilation of the abdominal aorta extending to the common iliac arteries with mural thrombus and severe atherosclerotic calcifications, enlarged since prior exam 2023.  Vascular surgery was consulted, Dr. Pearline reviewed imaging and reported that this is a normal amount of growth over the last 2 years and recommend outpatient follow-up in vascular surgery clinic with repeat CT a abdomen/pelvis -- Outpatient follow-up with vascular surgery  Large right inguinal hernia Has been present with no significant change over the past 1.5 years per patient.  CT abdomen/pelvis on admission with large right sided inguinal hernia extending to the scrotal sac containing bowel, stable from prior exams without obstruction. -- Outpatient follow-up with PCP/general surgery   Chronic systolic/diastolic congestive heart failure with improved LVEF. HTN Not on any home medications.  TTE 12/19/2021 with LVEF 35-40%, LV with moderate decreased function with regional wall motion abnormalities and severe hypokinesis of the mid--apical anteroseptal/inferior septal and anterior walls, grade 2 diastolic dysfunction, IVC dilated.  TTE this admission with LVEF 60 to 65%, grade 1 diastolic dysfunction, trivial MR, IVC dilated. 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  Apply Gerhardt's Butt Cream 3 times a day and prn soiling.  May sprinkle over Gerhardt's with floor stock antifungal powder (white and green label microguard) for extra drying effect.    DVT prophylaxis: SCDs Start: 11/20/23 1921    Code Status: Full Code Family Communication: No family present at bedside this morning  Disposition Plan:  Level of care: Med-Surg Status is: Inpatient Remains inpatient appropriate because: Pending SNF placement    Consultants:  [facility], Dr. Abran Vascular surgery, Dr. Pearline  Procedures:  TTE:  Scrotal ultrasound:   Antimicrobials:  None   Subjective: Patient seen examined bedside, lying in bed.  No complaints this morning.  Patient denies headache, no dizziness, no chest pain, no shortness of breath, no abdominal pain, no fever.  No acute concerns overnight per nursing staff.  Medically stable to discharge to SNF once bed available and insurance authorization  Objective: Vitals:   11/23/23 1354 11/23/23 2158 11/23/23 2253 11/24/23 0437  BP: 126/89 (!) 139/91  137/83  Pulse: 80 80 82 84  Resp: 20 18 20 18   Temp: 98.3 F (36.8 C) 98 F (36.7 C)  98 F (36.7 C)  TempSrc: Oral Oral    SpO2: 99% 98% 97% 96%  Weight:      Height:        Intake/Output Summary (Last 24 hours) at 11/24/2023 1221 Last data filed at 11/24/2023 0549 Gross per 24 hour  Intake 360 ml  Output 2250 ml  Net -1890 ml   Filed Weights   11/20/23 2227  Weight: 49.8 kg    Examination:  Physical Exam: GEN: NAD, alert and oriented x 3, chronically ill/disheveled/malodorous in appearance, appears older than stated age HEENT: NCAT, PERRL, EOMI, sclera clear, dry mucous membranes, poor dentition PULM: CTAB w/o wheezes/crackles, normal respiratory effort on room air CV: RRR w/o M/G/R GI: abd  soft, NTND, + BS GU: Noted significant right sided scrotal edema without erythema/fluctuance/crepitus MSK: no peripheral edema, moves all extremities independently NEURO: No focal neurological deficit PSYCH: normal mood/affect Integumentary: Scrotum, buttock, thigh wounds as depicted below            Data Reviewed: I have personally reviewed following labs and imaging studies  CBC: Recent Labs  Lab 11/20/23 1404 11/21/23 0057 11/21/23 0833 11/21/23 2012 11/22/23 0518 11/23/23 0739  WBC 4.6 3.4*  --   --  3.7*  --   NEUTROABS 3.4  --   --   --   --   --   HGB 10.0* 8.8* 8.8* 8.4* 8.6* 8.4*  HCT 33.2* 29.4* 29.9* 27.7* 29.9* 30.0*  MCV 83.8 84.0  --   --  85.4  --   PLT 195 190  --   --  208  --    Basic Metabolic Panel: Recent Labs  Lab 11/20/23 1404 11/21/23 0057 11/22/23 0518  NA 133* 133* 134*  K 4.5 3.9 3.7  CL 97* 98 101  CO2 24 24 23   GLUCOSE 97 86 95  BUN 11 8 7*  CREATININE 0.72 0.66 0.73  CALCIUM  9.8 9.1 8.8*  MG  --   --  2.3  PHOS  --   --  2.5   GFR: Estimated Creatinine Clearance: 58.8 mL/min (by C-G formula based on SCr of 0.73 mg/dL). Liver  Function Tests: Recent Labs  Lab 11/20/23 1404  AST 22  ALT 15  ALKPHOS 80  BILITOT 0.5  PROT 7.8  ALBUMIN 4.2   Recent Labs  Lab 11/20/23 1404  LIPASE 46   No results for input(s): AMMONIA in the last 168 hours. Coagulation Profile: No results for input(s): INR, PROTIME in the last 168 hours. Cardiac Enzymes: No results for input(s): CKTOTAL, CKMB, CKMBINDEX, TROPONINI in the last 168 hours. BNP (last 3 results) Recent Labs    11/20/23 1404  PROBNP 94.1   HbA1C: No results for input(s): HGBA1C in the last 72 hours. CBG: No results for input(s): GLUCAP in the last 168 hours. Lipid Profile: No results for input(s): CHOL, HDL, LDLCALC, TRIG, CHOLHDL, LDLDIRECT in the last 72 hours. Thyroid Function Tests: No results for input(s): TSH, T4TOTAL,  FREET4, T3FREE, THYROIDAB in the last 72 hours.  Anemia Panel: No results for input(s): VITAMINB12, FOLATE, FERRITIN, TIBC, IRON, RETICCTPCT in the last 72 hours.  Sepsis Labs: Recent Labs  Lab 11/20/23 1418  LATICACIDVEN 1.8    Recent Results (from the past 240 hours)  Resp panel by RT-PCR (RSV, Flu A&B, Covid) Anterior Nasal Swab     Status: None   Collection Time: 11/20/23  7:23 PM   Specimen: Anterior Nasal Swab  Result Value Ref Range Status   SARS Coronavirus 2 by RT PCR NEGATIVE NEGATIVE Final    Comment: (NOTE) SARS-CoV-2 target nucleic acids are NOT DETECTED.  The SARS-CoV-2 RNA is generally detectable in upper respiratory specimens during the acute phase of infection. The lowest concentration of SARS-CoV-2 viral copies this assay can detect is 138 copies/mL. A negative result does not preclude SARS-Cov-2 infection and should not be used as the sole basis for treatment or other patient management decisions. A negative result may occur with  improper specimen collection/handling, submission of specimen other than nasopharyngeal swab, presence of viral mutation(s) within the areas targeted by this assay, and inadequate number of viral copies(<138 copies/mL). A negative result must be combined with clinical observations, patient history, and epidemiological information. The expected result is Negative.  Fact Sheet for Patients:  BloggerCourse.com  Fact Sheet for Healthcare Providers:  SeriousBroker.it  This test is no t yet approved or cleared by the [place]  FDA and  has been authorized for detection and/or diagnosis of SARS-CoV-2 by FDA under an Emergency Use Authorization (EUA). This EUA will remain  in effect (meaning this test can be used) for the duration of the COVID-19 declaration under Section 564(b)(1) of the Act, 21 U.S.C.section 360bbb-3(b)(1), unless the authorization is terminated   or revoked sooner.       Influenza A by PCR NEGATIVE NEGATIVE Final   Influenza B by PCR NEGATIVE NEGATIVE Final    Comment: (NOTE) The Xpert Xpress SARS-CoV-2/FLU/RSV plus assay is intended as an aid in the diagnosis of influenza from Nasopharyngeal swab specimens and should not be used as a sole basis for treatment. Nasal washings and aspirates are unacceptable for Xpert Xpress SARS-CoV-2/FLU/RSV testing.  Fact Sheet for Patients: BloggerCourse.com  Fact Sheet for Healthcare Providers: SeriousBroker.it  This test is not yet approved or cleared by the [place]  FDA and has been authorized for detection and/or diagnosis of SARS-CoV-2 by FDA under an Emergency Use Authorization (EUA). This EUA will remain in effect (meaning this test can be used) for the duration of the COVID-19 declaration under Section 564(b)(1) of the Act, 21 U.S.C. section 360bbb-3(b)(1), unless the authorization is terminated or revoked.  Resp Syncytial Virus by PCR NEGATIVE NEGATIVE Final    Comment: (NOTE) Fact Sheet for Patients: BloggerCourse.com  Fact Sheet for Healthcare Providers: SeriousBroker.it  This test is not yet approved or cleared by the [place]  FDA and has been authorized for detection and/or diagnosis of SARS-CoV-2 by FDA under an Emergency Use Authorization (EUA). This EUA will remain in effect (meaning this test can be used) for the duration of the COVID-19 declaration under Section 564(b)(1) of the Act, 21 U.S.C. section 360bbb-3(b)(1), unless the authorization is terminated or revoked.  Performed at [facility], [address] [place], [place] [address]          Radiology Studies: No results found.       Scheduled Meds:  ferrous sulfate  325 mg Oral Q breakfast   Gerhardt's butt cream   Topical TID   pantoprazole   40 mg Oral BID    Continuous Infusions:     LOS: 3 days    Time spent: 40 minutes spent on 11/24/2023 caring for this patient face-to-face including chart review, ordering labs/tests, documenting, discussion with nursing staff, consultants, updating family and interview/physical exam    Camellia PARAS [place], DO [facility] Available via Epic secure chat 7am-7pm After these hours, please refer to coverage provider listed on [email or web address] 11/24/2023, 12:21 PM

## 2023-11-24 NOTE — Plan of Care (Signed)

## 2023-11-24 NOTE — Progress Notes (Signed)
   11/24/23 2310  BiPAP/CPAP/SIPAP  Reason BIPAP/CPAP not in use Non-compliant

## 2023-11-25 DIAGNOSIS — K922 Gastrointestinal hemorrhage, unspecified: Secondary | ICD-10-CM | POA: Diagnosis not present

## 2023-11-25 LAB — BASIC METABOLIC PANEL WITH GFR
Anion gap: 10 (ref 5–15)
BUN: 7 mg/dL — ABNORMAL LOW (ref 8–23)
CO2: 28 mmol/L (ref 22–32)
Calcium: 9.5 mg/dL (ref 8.9–10.3)
Chloride: 97 mmol/L — ABNORMAL LOW (ref 98–111)
Creatinine, Ser: 0.78 mg/dL (ref 0.61–1.24)
GFR, Estimated: >60 mL/min (ref >60)
Glucose, Bld: 83 mg/dL (ref 70–99)
Potassium: 4 mmol/L (ref 3.5–5.1)
Sodium: 134 mmol/L — ABNORMAL LOW (ref 135–145)

## 2023-11-25 LAB — CBC
HCT: 33.1 % — ABNORMAL LOW (ref 39.0–52.0)
Hemoglobin: 9.8 g/dL — ABNORMAL LOW (ref 13.0–17.0)
MCH: 25.5 pg — ABNORMAL LOW (ref 26.0–34.0)
MCHC: 29.6 g/dL — ABNORMAL LOW (ref 30.0–36.0)
MCV: 86 fL (ref 80.0–100.0)
Platelets: 328 K/uL (ref 150–400)
RBC: 3.85 MIL/uL — ABNORMAL LOW (ref 4.22–5.81)
RDW: 19.5 % — ABNORMAL HIGH (ref 11.5–15.5)
WBC: 3.9 K/uL — ABNORMAL LOW (ref 4.0–10.5)
nRBC: 0 % (ref 0.0–0.2)

## 2023-11-25 NOTE — TOC Progression Note (Addendum)
 Transition of Care [facility]) - Progression Note    Patient Details  Name: Greg [name] MRN: [id number] Date of Birth: 1951/01/24  [facility] CM/SW Contact  Ambers Iyengar, Nathanel, RN Phone Number: 11/25/2023, 10:38 AM  Clinical Narrative:  Bed offers given await choice. Patient's sister Verla will asst w/bed choice-patient agree.   Destination            Service Provider Request Status Stars Address Phone  [facility], [facility] Preferred SNF  Accepted [address], [place] [address] [phone]  [facility]  Accepted 5 [address] [place] [address] [phone]  [facility]  Accepted 1 [address] [place] [address] [phone]  [facility]erred SN[facility]  Accepted 2 [address] [place] [address] [phone]  [facility]  Accepted 2 [address] [place] [address], [place] [address] [phone]  [facility]  Accepted 1 [address] [place] [address] [phone]  [facility] SN[facility]  Accepted 1 [address],  [place] [address] [phone]  [facility] Preferred [facility]  Accepted 1 [address] [place] [address] [phone]  [facility]  Accepted 2 [address], [place] [address] [phone]            Expected Discharge Plan: Skilled Nursing Facility Barriers to Discharge: Continued Medical Work up               Expected Discharge Plan and Services   Discharge Planning Services: CM Consult Post Acute Care Choice: Home Health Living arrangements for the past 2 months: Single Family Home                                       Social Drivers of Health (SDOH) Interventions SDOH Screenings   Food Insecurity: No Food Insecurity (11/20/2023)  Housing: Low Risk  (11/20/2023)  Transportation Needs: No Transportation Needs (11/20/2023)   Utilities: Not At Risk (11/20/2023)  Financial Resource Strain: Low Risk  (02/12/2022)   Received from [facility]  Physical Activity: Inactive (11/06/2021)   Received from [facility]  Social Connections: Moderately Isolated (11/21/2023)  Stress: No Stress Concern Present (11/06/2021)   Received from [facility]  Tobacco Use: High Risk (11/20/2023)    Readmission Risk Interventions    12/24/2021    1:07 PM 12/20/2021   10:08 AM 12/20/2021   10:03 AM  Readmission Risk Prevention Plan  Post Dischage Appt Complete Complete Complete  Medication Screening Complete Complete Complete  Transportation Screening Complete Complete Complete

## 2023-11-25 NOTE — Progress Notes (Addendum)
 PROGRESS NOTE    Greg [name]  FMW:[id number] DOB: 1950-12-15 DOA: 11/20/2023 PCP: Pcp, No    Brief Narrative:   Greg [name] is a 73 y.o. 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  Patient is a poor historian, lives alone and very private and letting anyone in his house per family.  Denies nausea, no vomiting, no chest pain, no fever/chills, no scrotal pain.  Patient reports the scrotum has been swollen for 1.5 years; unchanged.  In the ED, temperature 98.3 F, HR 97, RR 16, BP 143/92, SpO2 100% in room air.  WBC 4.6, hemoglobin 10.0, platelet count 195.  Sodium 133, potassium 4.5, chloride 97, CO2 24, glucose 97, BUN 11, creatinine 0.72.  Lipase 46.  AST 22, ALT 15, total bilirubin 0.5.  BNP 94.1.  Lactic acid 1.8.  CT abdomen/pelvis with contrast with noted aneurysmal dilation of the abdominal aorta extending to the common iliac arteries with mural thrombus and severe atherosclerotic calcifications, enlarged since prior exam 2023; large right sided inguinal hernia extending to the scrotal sac containing bowel, stable from prior exams without bowel obstruction, prominent collateral veins overlying right scrotal sac.  GI, vascular surgery was consulted.  TRH was consulted for admission for further evaluation and management of concern for recurrent UGIB  Assessment & Plan:   Concern for acute upper GI bleed Hx AVMs s/p APC Hx iron deficiency anemia Hx gastric cancer/Barrett's esophagus Patient presenting with concerns of recurrent GI bleed.  History of AVMs and underwent APC 2023.  Also with known iron deficiency anemia, lost to follow-up after previous hospitalization.  Patient with hemoglobin 10.0 at time of admission.  Anemia panel with iron 16, TIBC 417, ferritin 56, folate 14.4,  vitamin B12 735.  Received IV iron x 1 on 11/21/2023.  Seen by gastroenterology, given stability of hemoglobin no indication for endoscopy at this time. -- [facility] following, appreciate assistance -- Hgb 10.0>8.8>8.8>8.4>8.6>8.4>9.8, stable -- Protonix  40 mg PO  BID -- Ferrous sulfate 325 mg p.o. daily  Hyponatremia Sodium slightly low 133, suspect etiology likely secondary to poor oral intake.  Supported with IV fluid hydration. -- Continue to encourage increased oral intake  Abdominal aortic aneurysm CT abdomen/pelvis with noted aneurysmal dilation of the abdominal aorta extending to the common iliac arteries with mural thrombus and severe atherosclerotic calcifications, enlarged since prior exam 2023.  Vascular surgery was consulted, Dr. Pearline reviewed imaging and reported that this is a normal amount of growth over the last 2 years and recommend outpatient follow-up in vascular surgery clinic with repeat CT a abdomen/pelvis -- Outpatient follow-up with vascular surgery  Large right inguinal hernia Has been present with no significant change over the past 1.5 years per patient.  CT abdomen/pelvis on admission with large right sided inguinal hernia extending to the scrotal sac containing bowel, stable from prior exams without obstruction. -- Outpatient follow-up with PCP/general surgery   Chronic systolic/diastolic congestive heart failure with improved LVEF. HTN Not on any home medications.  TTE 12/19/2021 with LVEF 35-40%, LV with moderate decreased function with regional wall motion abnormalities and severe hypokinesis of the mid--apical anteroseptal/inferior septal and anterior walls, grade 2 diastolic dysfunction, IVC dilated.  TTE this admission with LVEF 60 to 65%, grade 1 diastolic dysfunction, trivial MR, IVC dilated. 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  Apply Gerhardt's Butt Cream 3 times a day and prn soiling.  May sprinkle over Gerhardt's with floor stock antifungal powder (white and green label microguard) for extra drying effect.    DVT prophylaxis: SCDs Start: 11/20/23 1921    Code Status: Full Code Family Communication: No family present at bedside this morning  Disposition Plan:  Level of care: Med-Surg Status is: Inpatient Remains inpatient appropriate because: Pending SNF placement    Consultants:  [facility], Dr. Abran Vascular surgery, Dr. Pearline  Procedures:  TTE:  Scrotal ultrasound:   Antimicrobials:  None   Subjective: Patient seen examined bedside, lying in bed.  Asking for assistance with the TV remote this morning, otherwise no complaints.  Patient denies headache, no dizziness, no chest pain, no shortness of breath, no abdominal pain, no fever.  No acute concerns overnight per nursing staff.  Medically stable to discharge to SNF once bed available and insurance authorization  Objective: Vitals:   11/24/23 0437 11/24/23 1330 11/24/23 2142 11/25/23 0502  BP: 137/83 113/85 92/67 101/68  Pulse: 84 90 80 82  Resp: 18 20 18 16   Temp: 98 F (36.7 C) 99.2 F (37.3 C) 98.8 F (37.1 C) 98.2 F (36.8 C)  TempSrc:  Oral    SpO2: 96% 98% 98% 97%  Weight:      Height:        Intake/Output Summary (Last 24 hours) at 11/25/2023 1026 Last data filed at 11/25/2023 0132 Gross per 24 hour  Intake 240 ml  Output 250 ml  Net -10 ml   Filed Weights   11/20/23 2227  Weight: 49.8 kg    Examination:  Physical Exam: GEN: NAD, alert and oriented x 3, chronically ill/disheveled/malodorous in appearance, matted hair, appears older than stated age HEENT: NCAT, PERRL, EOMI, sclera clear, dry mucous membranes, poor dentition PULM: CTAB w/o  wheezes/crackles, normal respiratory effort on room air CV: RRR w/o M/G/R GI: abd soft, NTND, + BS GU: Noted significant right sided scrotal edema without erythema/fluctuance/crepitus MSK: no peripheral edema, moves all extremities independently NEURO: No focal neurological deficit PSYCH: normal mood/affect Integumentary: Scrotum, buttock, thigh wounds as depicted below            Data Reviewed: I have personally reviewed following labs and imaging studies  CBC: Recent Labs  Lab 11/20/23 1404 11/21/23 0057 11/21/23 0833 11/21/23 2012 11/22/23 0518 11/23/23 0739 11/25/23 0517  WBC 4.6 3.4*  --   --  3.7*  --  3.9*  NEUTROABS 3.4  --   --   --   --   --   --   HGB 10.0* 8.8* 8.8* 8.4* 8.6* 8.4* 9.8*  HCT 33.2* 29.4* 29.9* 27.7* 29.9* 30.0* 33.1*  MCV 83.8 84.0  --   --  85.4  --  86.0  PLT 195 190  --   --  208  --  328   Basic Metabolic Panel: Recent Labs  Lab 11/20/23 1404 11/21/23 0057 11/22/23 0518 11/25/23 0517  NA 133* 133* 134* 134*  K 4.5 3.9 3.7 4.0  CL 97* 98 101 97*  CO2 24 24 23 28   GLUCOSE 97 86 95 83  BUN 11 8 7* 7*  CREATININE 0.72 0.66 0.73 0.78  CALCIUM  9.8 9.1 8.8* 9.5  MG  --   --  2.3  --   PHOS  --   --  2.5  --    GFR: Estimated Creatinine Clearance: 58.8 mL/min (by C-G formula based on SCr of 0.78 mg/dL). Liver Function Tests: Recent Labs  Lab 11/20/23 1404  AST 22  ALT 15  ALKPHOS 80  BILITOT 0.5  PROT 7.8  ALBUMIN 4.2   Recent Labs  Lab 11/20/23 1404  LIPASE 46   No results for input(s): AMMONIA in the last 168 hours. Coagulation Profile: No results for input(s): INR, PROTIME in the last 168 hours. Cardiac Enzymes: No results for input(s): CKTOTAL, CKMB, CKMBINDEX, TROPONINI in the last 168 hours. BNP (last 3 results) Recent Labs    11/20/23 1404  PROBNP 94.1   HbA1C: No results for input(s): HGBA1C in the last 72 hours. CBG: No results for input(s): GLUCAP in the last 168 hours. Lipid  Profile: No results for input(s): CHOL, HDL, LDLCALC, TRIG, CHOLHDL, LDLDIRECT in the last 72 hours. Thyroid Function Tests: No results for input(s): TSH, T4TOTAL, FREET4, T3FREE, THYROIDAB in the last 72 hours.  Anemia Panel: No results for input(s): VITAMINB12, FOLATE, FERRITIN, TIBC, IRON, RETICCTPCT in the last 72 hours.  Sepsis Labs: Recent Labs  Lab 11/20/23 1418  LATICACIDVEN 1.8    Recent Results (from the past 240 hours)  Resp panel by RT-PCR (RSV, Flu A&B, Covid) Anterior Nasal Swab     Status: None   Collection Time: 11/20/23  7:23 PM   Specimen: Anterior Nasal Swab  Result Value Ref Range Status   SARS Coronavirus 2 by RT PCR NEGATIVE NEGATIVE Final    Comment: (NOTE) SARS-CoV-2 target nucleic acids are NOT DETECTED.  The SARS-CoV-2 RNA is generally detectable in upper respiratory specimens during the acute phase of infection. The lowest concentration of SARS-CoV-2 viral copies this assay can detect is 138 copies/mL. A negative result does not preclude SARS-Cov-2 infection and should not be used as the sole basis for treatment or other patient management decisions. A negative result may occur with  improper specimen collection/handling, submission of specimen other than nasopharyngeal swab, presence of viral mutation(s) within the areas targeted by this assay, and inadequate number of viral copies(<138 copies/mL). A negative result must be combined with clinical observations, patient history, and epidemiological information. The expected result is Negative.  Fact Sheet for Patients:  BloggerCourse.com  Fact Sheet for Healthcare Providers:  SeriousBroker.it  This test is no t yet approved or cleared by the [place]  FDA and  has been authorized for detection and/or diagnosis of SARS-CoV-2 by FDA under an Emergency Use Authorization (EUA). This EUA will remain  in effect  (meaning this test can be used) for the duration of the COVID-19 declaration under Section 564(b)(1) of the Act, 21 U.S.C.section 360bbb-3(b)(1), unless the authorization is terminated  or revoked sooner.       Influenza A by PCR NEGATIVE NEGATIVE Final   Influenza B by PCR NEGATIVE NEGATIVE Final    Comment: (NOTE) The Xpert Xpress SARS-CoV-2/FLU/RSV plus assay is intended as an aid in the diagnosis of influenza from Nasopharyngeal swab specimens and should not be used as a sole basis for treatment. Nasal washings and aspirates are unacceptable for Xpert Xpress SARS-CoV-2/FLU/RSV testing.  Fact Sheet for Patients: BloggerCourse.com  Fact Sheet for Healthcare Providers: SeriousBroker.it  This test is not yet approved or cleared by the [place]  FDA and has been authorized for detection and/or diagnosis of SARS-CoV-2 by FDA under an Emergency Use Authorization (EUA). This EUA will remain  in effect (meaning this test can be used) for the duration of the COVID-19 declaration under Section 564(b)(1) of the Act, 21 U.S.C. section 360bbb-3(b)(1), unless the authorization is terminated or revoked.     Resp Syncytial Virus by PCR NEGATIVE NEGATIVE Final    Comment: (NOTE) Fact Sheet for Patients: BloggerCourse.com  Fact Sheet for Healthcare Providers: SeriousBroker.it  This test is not yet approved or cleared by the [place]  FDA and has been authorized for detection and/or diagnosis of SARS-CoV-2 by FDA under an Emergency Use Authorization (EUA). This EUA will remain in effect (meaning this test can be used) for the duration of the COVID-19 declaration under Section 564(b)(1) of the Act, 21 U.S.C. section 360bbb-3(b)(1), unless the authorization is terminated or revoked.  Performed at [facility], [address] [place], [place] [address]           Radiology Studies: No results found.       Scheduled Meds:  ferrous sulfate  325 mg Oral Q breakfast   Gerhardt's butt cream   Topical TID   pantoprazole   40 mg Oral BID   Continuous Infusions:     LOS: 4 days    Time spent: 40 minutes spent on 11/25/2023 caring for this patient face-to-face including chart review, ordering labs/tests, documenting, discussion with nursing staff, consultants, updating family and interview/physical exam    Camellia PARAS [place], DO [facility] Available via Epic secure chat 7am-7pm After these hours, please refer to coverage provider listed on [email or web address] 11/25/2023, 10:26 AM

## 2023-11-25 NOTE — Plan of Care (Signed)
   Problem: Education: Goal: Knowledge of General Education information will improve Description: Including pain rating scale, medication(s)/side effects and non-pharmacologic comfort measures Outcome: Progressing   Problem: Health Behavior/Discharge Planning: Goal: Ability to manage health-related needs will improve Outcome: Progressing   Problem: Clinical Measurements: Goal: Will remain free from infection Outcome: Progressing

## 2023-11-25 NOTE — Progress Notes (Signed)
 Physical Therapy Treatment Patient Details Name: Greg [name] MRN: [id number] DOB: Mar 21, 1950 Today's Date: 11/25/2023   History of Present Illness Pt is 73 yo male presented on 11/20/23 with concern for acute upper GI bleed.  Pt with large R inguinal hernia extending into scrotal  sac containing bowel - stable from prior exams.  Pt with hx including but not limited to CHF, HTN, HLD, gastric CA/Barrett's esophagus, anemia, hx of GIB.    PT Comments  Pt agreeable to PT with encouragement and education on role of PT.  He was oriented x 3 but made confused statements and non-sensical statements/words at times.  Pt requring frequent cues for safety and transfer techniques.  Pt did ambulate in room but fatigued easily and needing min A.  PT having to support pt's scrotum with a towel sling for pt's mobility.  Noted that he had declined scrotal sling in past, but is now agreeable.  Notified nursing. Pt with poor safety awareness and needing assistance for mobility - continue to recommend Patient will benefit from continued inpatient follow up therapy, <3 hours/day    If plan is discharge home, recommend the following: A little help with walking and/or transfers;A little help with bathing/dressing/bathroom;Assistance with cooking/housework;Assist for transportation   Can travel by private vehicle     Yes  Equipment Recommendations  Rolling walker (2 wheels)    Recommendations for Other Services       Precautions / Restrictions Precautions Precautions: Fall     Mobility  Bed Mobility Overal bed mobility: Needs Assistance Bed Mobility: Supine to Sit, Sit to Supine     Supine to sit: Min assist Sit to supine: Min assist        Transfers Overall transfer level: Needs assistance Equipment used: Rolling walker (2 wheels) Transfers: Sit to/from Stand Sit to Stand: Min assist           General transfer comment: Min A to stand and steady    Ambulation/Gait Ambulation/Gait  assistance: Min Chemical engineer (Feet): 30 Feet Assistive device: Rolling walker (2 wheels) Gait Pattern/deviations: Step-to pattern, Decreased stride length Gait velocity: decreased     General Gait Details: Pt attempting to hold scrotum with towel sling but unable to do so and walk.  Therapist supported scrotum with towel sling so that pt could hold RW.  Noted that he had declined scrotal sling in past - now agreeable -notified nursing to order. Pt very unsteady and allowing RW to get to far forward - required min A balance and repeated cues for RW proximity   Stairs             Wheelchair Mobility     Tilt Bed    Modified Rankin (Stroke Patients Only)       Balance Overall balance assessment: Needs assistance, History of Falls Sitting-balance support: Feet supported, No upper extremity supported Sitting balance-Leahy Scale: Good     Standing balance support: Bilateral upper extremity supported Standing balance-Leahy Scale: Poor Standing balance comment: CGA to min assist with RW                            Communication    Cognition Arousal: Alert Behavior During Therapy: Flat affect   PT - Cognitive impairments: No family/caregiver present to determine baseline, Awareness, Sequencing, Problem solving, Safety/Judgement                       PT - Cognition  Comments: Pt oriented x 3 and follows basic commands.  At times pt making non-sensical statements/words.        Cueing    Exercises      General Comments General comments (skin integrity, edema, etc.): Pt wtih SOB after ambulation. Sats were 99% and HR 80s-90s      Pertinent Vitals/Pain Pain Assessment Pain Assessment: 0-10 Pain Score: 9  Pain Location: L shoulder, R hip Pain Descriptors / Indicators: Discomfort Pain Intervention(s): Limited activity within patient's tolerance, Monitored during session, Patient requesting pain meds-RN notified, Repositioned (no signs of  pain)    Home Living                          Prior Function            PT Goals (current goals can now be found in the care plan section) Progress towards PT goals: Progressing toward goals    Frequency    Min 1X/week      PT Plan      Co-evaluation              AM-PAC PT 6 Clicks Mobility   Outcome Measure  Help needed turning from your back to your side while in a flat bed without using bedrails?: A Little Help needed moving from lying on your back to sitting on the side of a flat bed without using bedrails?: A Little Help needed moving to and from a bed to a chair (including a wheelchair)?: A Lot (mod cues) Help needed standing up from a chair using your arms (e.g., wheelchair or bedside chair)?: A Lot (mod cues) Help needed to walk in hospital room?: A Lot (cues) Help needed climbing 3-5 steps with a railing? : A Lot 6 Click Score: 14    End of Session Equipment Utilized During Treatment: Gait belt Activity Tolerance: Patient tolerated treatment well;No increased pain Patient left: with call bell/phone within reach;in bed;with bed alarm set (declined sitting) Nurse Communication: Mobility status PT Visit Diagnosis: Muscle weakness (generalized) (M62.81);History of falling (Z91.81)     Time: 1245-1310 PT Time Calculation (min) (ACUTE ONLY): 25 min  Charges:    $Gait Training: 8-22 mins $Therapeutic Activity: 8-22 mins PT General Charges $$ ACUTE PT VISIT: 1 Visit                     Benjiman, PT Acute Rehab Services [facility] [phone]    TAHA DIMOND 11/25/2023, 2:07 PM

## 2023-11-26 DIAGNOSIS — K625 Hemorrhage of anus and rectum: Secondary | ICD-10-CM

## 2023-11-26 MED ORDER — OXYCODONE HCL 5 MG PO TABS: 5.0000 mg | ORAL_TABLET | Freq: Four times a day (QID) | ORAL | 0 refills | Status: AC | PRN

## 2023-11-26 MED ORDER — FERROUS SULFATE 325 (65 FE) MG PO TABS: 325.0000 mg | ORAL_TABLET | Freq: Every day | ORAL | 3 refills | Status: AC

## 2023-11-26 MED ORDER — PANTOPRAZOLE SODIUM 40 MG PO TBEC: 40.0000 mg | DELAYED_RELEASE_TABLET | Freq: Two times a day (BID) | ORAL | 1 refills | Status: AC

## 2023-11-26 NOTE — Progress Notes (Incomplete)
 PROGRESS NOTE    Greg [name]  FMW:[id number] DOB: February 28, 1950 DOA: 11/20/2023 PCP: Pcp, No   Brief Narrative: 75 with past medical history significant for chronic systolic heart failure, high patient, hyperlipidemia, gastric cancer/Barret esophagus, iron deficiency anemia, history of upper GI bleed secondary to AVMs status post APC who presented to [facility] on 11/20/2023 with concern for GI bleed.  Patient is a poor historian, lives alone and very private and letting any 1 in his house per family.  Denies nausea or vomiting.  He reports swelling of his scrotum for 1-1/2-year unchanged.  Following, platelets 195, AST 22, lactic acid 1.8.  CT abdomen and pelvis noted aneurysmal dilation of the abdominal aorta extending to the common iliac artery with mural thrombus and severe atherosclerotic calcification and large since 2023, large right-sided inguinal hernia extending to the scrotal sac containing bowel, stable from previous exam and without bowel obstruction, prominent collateral veins overlying the right scrotal sac.  GI, vascular surgery consulted   Assessment & Plan:   Principal Problem:   GI bleeding Active Problems:   Rectal bleed   Anemia   Inguinal hernia, right   Gastric AVM   Upper GI bleed   1-Acute upper GI bleed, Hx AVMs s/p APC Hx iron deficiency anemia Hx gastric cancer/Barrett's esophagus - Iron 16, TIBC 417.  Received IV iron this hospitalization   Hyponatremia,  Abdominal aortic aneurysm:  Large right inguinal hernia:  Chronic systolic diastolic heart failure with improved left ventricular ejection fraction:    Erythema intertrigo, friction dermatitis to genital/thighs    Estimated body mass index is 16.7 kg/m as calculated from the following:   Height as of this encounter: 5' 8 (1.727 m).   Weight as of this encounter: 49.8 kg.   DVT prophylaxis:  Code Status:  Family Communication: Disposition Plan:  Status is:  Inpatient {Inpatient:23812}    Consultants:  ***  Procedures:  ***  Antimicrobials:    Subjective: ***  Objective: Vitals:   11/24/23 2142 11/25/23 0502 11/25/23 2224 11/26/23 0626  BP: 92/67 101/68 108/77 111/83  Pulse: 80 82 87 73  Resp: 18 16 18 16   Temp: 98.8 F (37.1 C) 98.2 F (36.8 C) 99 F (37.2 C) 98.2 F (36.8 C)  TempSrc:    Oral  SpO2: 98% 97% 96% 100%  Weight:      Height:        Intake/Output Summary (Last 24 hours) at 11/26/2023 0759 Last data filed at 11/26/2023 0631 Gross per 24 hour  Intake --  Output 1000 ml  Net -1000 ml   Filed Weights   11/20/23 2227  Weight: 49.8 kg    Examination:  General exam: Appears calm and comfortable  Respiratory system: Clear to auscultation. Respiratory effort normal. Cardiovascular system: S1 & S2 heard, RRR. No JVD, murmurs, rubs, gallops or clicks. No pedal edema. Gastrointestinal system: Abdomen is nondistended, soft and nontender. No organomegaly or masses felt. Normal bowel sounds heard. Central nervous system: Alert and oriented. No focal neurological deficits. Extremities: Symmetric 5 x 5 power. Skin: No rashes, lesions or ulcers Psychiatry: Judgement and insight appear normal. Mood & affect appropriate.     Data Reviewed: I have personally reviewed following labs and imaging studies  CBC: Recent Labs  Lab 11/20/23 1404 11/21/23 0057 11/21/23 0833 11/21/23 2012 11/22/23 0518 11/23/23 0739 11/25/23 0517  WBC 4.6 3.4*  --   --  3.7*  --  3.9*  NEUTROABS 3.4  --   --   --   --   --   --  HGB 10.0* 8.8* 8.8* 8.4* 8.6* 8.4* 9.8*  HCT 33.2* 29.4* 29.9* 27.7* 29.9* 30.0* 33.1*  MCV 83.8 84.0  --   --  85.4  --  86.0  PLT 195 190  --   --  208  --  328   Basic Metabolic Panel: Recent Labs  Lab 11/20/23 1404 11/21/23 0057 11/22/23 0518 11/25/23 0517  NA 133* 133* 134* 134*  K 4.5 3.9 3.7 4.0  CL 97* 98 101 97*  CO2 24 24 23 28   GLUCOSE 97 86 95 83  BUN 11 8 7* 7*  CREATININE  0.72 0.66 0.73 0.78  CALCIUM  9.8 9.1 8.8* 9.5  MG  --   --  2.3  --   PHOS  --   --  2.5  --    GFR: Estimated Creatinine Clearance: 58.8 mL/min (by C-G formula based on SCr of 0.78 mg/dL). Liver Function Tests: Recent Labs  Lab 11/20/23 1404  AST 22  ALT 15  ALKPHOS 80  BILITOT 0.5  PROT 7.8  ALBUMIN 4.2   Recent Labs  Lab 11/20/23 1404  LIPASE 46   No results for input(s): AMMONIA in the last 168 hours. Coagulation Profile: No results for input(s): INR, PROTIME in the last 168 hours. Cardiac Enzymes: No results for input(s): CKTOTAL, CKMB, CKMBINDEX, TROPONINI in the last 168 hours. BNP (last 3 results) Recent Labs    11/20/23 1404  PROBNP 94.1   HbA1C: No results for input(s): HGBA1C in the last 72 hours. CBG: No results for input(s): GLUCAP in the last 168 hours. Lipid Profile: No results for input(s): CHOL, HDL, LDLCALC, TRIG, CHOLHDL, LDLDIRECT in the last 72 hours. Thyroid Function Tests: No results for input(s): TSH, T4TOTAL, FREET4, T3FREE, THYROIDAB in the last 72 hours. Anemia Panel: No results for input(s): VITAMINB12, FOLATE, FERRITIN, TIBC, IRON, RETICCTPCT in the last 72 hours. Sepsis Labs: Recent Labs  Lab 11/20/23 1418  LATICACIDVEN 1.8    Recent Results (from the past 240 hours)  Resp panel by RT-PCR (RSV, Flu A&B, Covid) Anterior Nasal Swab     Status: None   Collection Time: 11/20/23  7:23 PM   Specimen: Anterior Nasal Swab  Result Value Ref Range Status   SARS Coronavirus 2 by RT PCR NEGATIVE NEGATIVE Final    Comment: (NOTE) SARS-CoV-2 target nucleic acids are NOT DETECTED.  The SARS-CoV-2 RNA is generally detectable in upper respiratory specimens during the acute phase of infection. The lowest concentration of SARS-CoV-2 viral copies this assay can detect is 138 copies/mL. A negative result does not preclude SARS-Cov-2 infection and should not be used as the sole basis for  treatment or other patient management decisions. A negative result may occur with  improper specimen collection/handling, submission of specimen other than nasopharyngeal swab, presence of viral mutation(s) within the areas targeted by this assay, and inadequate number of viral copies(<138 copies/mL). A negative result must be combined with clinical observations, patient history, and epidemiological information. The expected result is Negative.  Fact Sheet for Patients:  BloggerCourse.com  Fact Sheet for Healthcare Providers:  SeriousBroker.it  This test is no t yet approved or cleared by the [place]  FDA and  has been authorized for detection and/or diagnosis of SARS-CoV-2 by FDA under an Emergency Use Authorization (EUA). This EUA will remain  in effect (meaning this test can be used) for the duration of the COVID-19 declaration under Section 564(b)(1) of the Act, 21 U.S.C.section 360bbb-3(b)(1), unless the authorization is terminated  or revoked sooner.  Influenza A by PCR NEGATIVE NEGATIVE Final   Influenza B by PCR NEGATIVE NEGATIVE Final    Comment: (NOTE) The Xpert Xpress SARS-CoV-2/FLU/RSV plus assay is intended as an aid in the diagnosis of influenza from Nasopharyngeal swab specimens and should not be used as a sole basis for treatment. Nasal washings and aspirates are unacceptable for Xpert Xpress SARS-CoV-2/FLU/RSV testing.  Fact Sheet for Patients: BloggerCourse.com  Fact Sheet for Healthcare Providers: SeriousBroker.it  This test is not yet approved or cleared by the [place]  FDA and has been authorized for detection and/or diagnosis of SARS-CoV-2 by FDA under an Emergency Use Authorization (EUA). This EUA will remain in effect (meaning this test can be used) for the duration of the COVID-19 declaration under Section 564(b)(1) of the Act, 21  U.S.C. section 360bbb-3(b)(1), unless the authorization is terminated or revoked.     Resp Syncytial Virus by PCR NEGATIVE NEGATIVE Final    Comment: (NOTE) Fact Sheet for Patients: BloggerCourse.com  Fact Sheet for Healthcare Providers: SeriousBroker.it  This test is not yet approved or cleared by the [place]  FDA and has been authorized for detection and/or diagnosis of SARS-CoV-2 by FDA under an Emergency Use Authorization (EUA). This EUA will remain in effect (meaning this test can be used) for the duration of the COVID-19 declaration under Section 564(b)(1) of the Act, 21 U.S.C. section 360bbb-3(b)(1), unless the authorization is terminated or revoked.  Performed at [facility], [address] [place], [place] [address]          Radiology Studies: No results found.      Scheduled Meds:  ferrous sulfate  325 mg Oral Q breakfast   Gerhardt's butt cream   Topical TID   pantoprazole   40 mg Oral BID   Continuous Infusions:   LOS: 5 days    Time spent: 35 Minutes    Elonda Giuliano A Shamecka Hocutt, MD [facility]   If 7PM-7AM, please contact night-coverage [email or web address]  11/26/2023, 7:59 AM

## 2023-11-26 NOTE — TOC Progression Note (Addendum)
 Transition of Care [facility]) - Progression Note    Patient Details  Name: Greg [name] MRN: [id number] Date of Birth: 1950-04-03  [facility] CM/SW Contact  Alaa Eyerman, Nathanel, RN Phone Number: 11/26/2023, 10:35 AM  Clinical Narrative: Left vm with Desiree(sister) patient defers to her-await choice from bed offers given.  -10:53a-Desiree(sister) chose [facility] await rep to confirm bed availability. MD updated.  -12:35p going to [facility] rm#[id number], report#[phone]. PTAR called No further CM needs.    Expected Discharge Plan: Skilled Nursing Facility Barriers to Discharge: Continued Medical Work up               Expected Discharge Plan and Services   Discharge Planning Services: CM Consult Post Acute Care Choice: Home Health Living arrangements for the past 2 months: Single Family Home                                       Social Drivers of Health (SDOH) Interventions SDOH Screenings   Food Insecurity: No Food Insecurity (11/20/2023)  Housing: Low Risk  (11/20/2023)  Transportation Needs: No Transportation Needs (11/20/2023)  Utilities: Not At Risk (11/20/2023)  Financial Resource Strain: Low Risk  (02/12/2022)   Received from [facility]  Physical Activity: Inactive (11/06/2021)   Received from [facility]  Social Connections: Moderately Isolated (11/21/2023)  Stress: No Stress Concern Present (11/06/2021)   Received from [facility]  Tobacco Use: High Risk (11/20/2023)    Readmission Risk Interventions    12/24/2021    1:07 PM 12/20/2021   10:08 AM 12/20/2021   10:03 AM  Readmission Risk Prevention Plan  Post Dischage Appt Complete Complete Complete  Medication Screening Complete Complete Complete  Transportation Screening Complete Complete Complete

## 2023-11-26 NOTE — Plan of Care (Signed)

## 2023-11-26 NOTE — Plan of Care (Signed)

## 2023-11-26 NOTE — Discharge Summary (Signed)
 Physician Discharge Summary   Patient: Greg [name] MRN: [id number] DOB: 23-Apr-1950  Admit date:     11/20/2023  Discharge date: 11/26/23  Discharge Physician: Greg [name]   PCP: Pcp, No   Recommendations at discharge:   Needs CBC to follow hb.  Needs to follow up with Vascular for aortic aneurysmal Needs to follow up with general surgery for large inguinal hernia.   Discharge Diagnoses: Principal Problem:   GI bleeding Active Problems:   Rectal bleed   Anemia   Inguinal hernia, right   Gastric AVM   Upper GI bleed  Resolved Problems:   * No resolved [name] problems. [facility] Course: 16 with past medical history significant for chronic systolic heart failure, high patient, hyperlipidemia, gastric cancer/Barret esophagus, iron deficiency anemia, history of upper GI bleed secondary to AVMs status post APC who presented to [name] ED on 11/20/2023 with concern for GI bleed.  Patient is a poor historian, lives alone and very private and letting any 1 in his house per family.  Denies nausea or vomiting.  He reports swelling of his scrotum for 1-1/2-year unchanged.  Following, platelets 195, AST 22, lactic acid 1.8.  CT abdomen and pelvis noted aneurysmal dilation of the abdominal aorta extending to the common iliac artery with mural thrombus and severe atherosclerotic calcification and large since 2023, large right-sided inguinal hernia extending to the scrotal sac containing bowel, stable from previous exam and without bowel obstruction, prominent collateral veins overlying the right scrotal sac.   GI, vascular surgery consulted    Assessment and Plan: No notes have been filed under this [name] service. Service: Hospitalist 1-Acute upper GI bleed, Hx AVMs s/p APC Hx iron deficiency anemia Hx gastric cancer/Barrett's esophagus - Iron 16, TIBC 417.  Received IV iron this hospitalization  Patient presents with concerns of recurrent GI bleed.  History of AVMs and  underwent APC 2023.  Also with known iron deficiency anemia, lost to follow-up after previous hospitalization.  Patient with hemoglobin 10.0 at time of admission.  Anemia panel with iron 16, TIBC 417, ferritin 56, folate 14.4, vitamin B12 735.  Received IV iron x 1 on 11/21/2023.  Seen by gastroenterology, given stability of hemoglobin no indication for endoscopy at this time. -- [facility] following, appreciate assistance -- Hgb 10.0>8.8>8.8>8.4>8.6>8.4>9.8, stable -- Protonix  40 mg PO  BID -- Ferrous sulfate 325 mg p.o. daily     Hyponatremia,  Sodium slightly low 133, suspect etiology likely secondary to poor oral intake.  Supported with IV fluid hydration. -- Continue to encourage increased oral intake -holding lasix .   Abdominal aortic aneurysm:  CT abdomen/pelvis with noted aneurysmal dilation of the abdominal aorta extending to the common iliac arteries with mural thrombus and severe atherosclerotic calcifications, enlarged since prior exam 2023.  Vascular surgery was consulted, Dr. Pearline reviewed imaging and reported that this is a normal amount of growth over the last 2 years and recommend outpatient follow-up in vascular surgery clinic with repeat CT a abdomen/pelvis -- Outpatient follow-up with vascular surgery   Large right inguinal hernia:  Has been present with no significant change over the past 1.5 years per patient.  CT abdomen/pelvis on admission with large right sided inguinal hernia extending to the scrotal sac containing bowel, stable from prior exams without obstruction. -- Outpatient follow-up with PCP/general surgery   Chronic systolic diastolic heart failure with improved left ventricular ejection fraction:  Not on any home medications.  TTE 12/19/2021 with LVEF 35-40%, LV with moderate  decreased function with regional wall motion abnormalities and severe hypokinesis of the mid--apical anteroseptal/inferior septal and anterior walls, grade 2 diastolic dysfunction, IVC  dilated.  TTE this admission with LVEF 60 to 65%, grade 1 diastolic dysfunction, trivial MR, IVC dilated. -- BP 101/68 this morning     Erythema intertrigo, friction dermatitis to genital/thighs   Seen by wound RN Cleanse buttocks/sacrum/ischium and scrotum with Vashe wound cleanser, do not rinse and allow to air dry.  Apply Gerhardt's Butt Cream 3 times a day and prn soiling.  May sprinkle over Gerhardt's with floor stock antifungal powder (white and green label microguard) for extra drying effect.   Weakness/debility/deconditioning -- PT/OT recommend SNF placement -- TOC consulted for placement      Consultants: Surgery, vascular, GI Procedures performed: none Disposition: Skilled nursing facility Diet recommendation:  Discharge Diet Orders (From admission, onward)     Start     Ordered   11/26/23 0000  Diet - low sodium heart healthy        11/26/23 1132           Cardiac diet DISCHARGE MEDICATION: Allergies as of 11/26/2023   No Known Allergies      Medication List     STOP taking these medications    aspirin  EC 325 MG tablet   aspirin  EC 81 MG tablet   atorvastatin  40 MG tablet Commonly known as: LIPITOR   empagliflozin  10 MG Tabs tablet Commonly known as: JARDIANCE    furosemide  20 MG tablet Commonly known as: LASIX        TAKE these medications    acetaminophen  325 MG tablet Commonly known as: TYLENOL  Take 2 tablets (650 mg total) by mouth every 6 (six) hours as needed for mild pain (or Fever >/= 101).   albuterol  108 (90 Base) MCG/ACT inhaler Commonly known as: VENTOLIN  HFA Inhale 2 puffs into the lungs every 4 (four) hours as needed for wheezing or shortness of breath.   ferrous sulfate 325 (65 FE) MG tablet Take 1 tablet (325 mg total) by mouth daily with breakfast. Start taking on: November 27, 2023   fluticasone -salmeterol 250-50 MCG/ACT Aepb Commonly known as: Advair  Diskus Inhale 1 puff into the lungs in the morning and at  bedtime.   oxyCODONE 5 MG immediate release tablet Commonly known as: Oxy IR/ROXICODONE Take 1 tablet (5 mg total) by mouth every 6 (six) hours as needed for moderate pain (pain score 4-6).   pantoprazole  40 MG tablet Commonly known as: PROTONIX  Take 1 tablet (40 mg total) by mouth 2 (two) times daily. What changed: when to take this               Discharge Care Instructions  (From admission, onward)           Start     Ordered   11/26/23 0000  Discharge wound care:       Comments: See above   11/26/23 1132            Contact information for after-discharge care     Destination     [facility], [place] .   Service: Skilled Nursing Contact information: [address] [place]  [address] [phone]                    Discharge Exam: Greg [name]   11/20/23 2227  Weight: 49.8 kg   General NAD  Condition at discharge: stable  The results of significant diagnostics from this hospitalization (including imaging,  microbiology, ancillary and laboratory) are listed below for reference.   Imaging Studies: VAS US  LOWER EXTREMITY VENOUS (DVT) Result Date: 11/21/2023  Lower Venous DVT Study Patient Name:  Greg [name]  Date of Exam:   11/21/2023 Medical Rec #: [id number]       Accession #:    [id number] Date of Birth: [birth year]       Patient Gender: M Patient Age:   58 years Exam Location:  [facility] Procedure:      VAS US  LOWER EXTREMITY VENOUS (DVT) Referring Phys: RAMESH KC --------------------------------------------------------------------------------  Indications: Swelling.  Risk Factors: None identified. Limitations: Poor ultrasound/tissue interface and patient positioning. Comparison Study: No prior studies. Performing Technologist: Cordella Collet RVT  Examination Guidelines: A complete evaluation includes B-mode imaging, spectral Doppler, color Doppler, and power Doppler as needed of all accessible  portions of each vessel. Bilateral testing is considered an integral part of a complete examination. Limited examinations for reoccurring indications may be performed as noted. The reflux portion of the exam is performed with the patient in reverse Trendelenburg.  +---------+---------------+---------+-----------+----------+-------------------+ RIGHT    CompressibilityPhasicitySpontaneityPropertiesThrombus Aging      +---------+---------------+---------+-----------+----------+-------------------+ CFV      Full           Yes      Yes                                      +---------+---------------+---------+-----------+----------+-------------------+ SFJ      Full                                                             +---------+---------------+---------+-----------+----------+-------------------+ FV Prox  Full                                                             +---------+---------------+---------+-----------+----------+-------------------+ FV Mid   Full                                                             +---------+---------------+---------+-----------+----------+-------------------+ FV DistalFull                                                             +---------+---------------+---------+-----------+----------+-------------------+ PFV      Full                                                             +---------+---------------+---------+-----------+----------+-------------------+ POP      Full  Yes      Yes                                      +---------+---------------+---------+-----------+----------+-------------------+ PTV      Full                                                             +---------+---------------+---------+-----------+----------+-------------------+ PERO                                                  Not well visualized  +---------+---------------+---------+-----------+----------+-------------------+   +---------+---------------+---------+-----------+----------+--------------+ LEFT     CompressibilityPhasicitySpontaneityPropertiesThrombus Aging +---------+---------------+---------+-----------+----------+--------------+ CFV      Full           Yes      Yes                                 +---------+---------------+---------+-----------+----------+--------------+ SFJ      Full                                                        +---------+---------------+---------+-----------+----------+--------------+ FV Prox  Full                                                        +---------+---------------+---------+-----------+----------+--------------+ FV Mid   Full                                                        +---------+---------------+---------+-----------+----------+--------------+ FV DistalFull                                                        +---------+---------------+---------+-----------+----------+--------------+ PFV      Full                                                        +---------+---------------+---------+-----------+----------+--------------+ POP      Full           Yes      Yes                                 +---------+---------------+---------+-----------+----------+--------------+ PTV  Full                                                        +---------+---------------+---------+-----------+----------+--------------+ PERO     Full                                                        +---------+---------------+---------+-----------+----------+--------------+     Summary: RIGHT: - There is no evidence of deep vein thrombosis in the lower extremity. However, portions of this examination were limited- see technologist comments above.  - No cystic structure found in the popliteal fossa.  LEFT: - There is no evidence of deep  vein thrombosis in the lower extremity.  - No cystic structure found in the popliteal fossa.  *See table(s) above for measurements and observations. Electronically signed by Gaile New MD on 11/21/2023 at 9:56:41 PM.    Final    US  SCROTUM Result Date: 11/21/2023 CLINICAL DATA:  Swelling. EXAM: SCROTAL ULTRASOUND DOPPLER ULTRASOUND OF THE TESTICLES TECHNIQUE: Complete ultrasound examination of the testicles, epididymis, and other scrotal structures was performed. Color and spectral Doppler ultrasound were also utilized to evaluate blood flow to the testicles. COMPARISON:  CT abdomen pelvis 11/20/2023 FINDINGS: Right testicle Right testicle is not visualized., likely to bowel shadowing. Left testicle Measurements: 3.9 x 2 x 3.8 cm. No mass or microlithiasis visualized. Right epididymis:  Not visualized. Left epididymis:  Normal in size and appearance. Hydrocele: Small left hydrocele is present. Scrotal calcifications are present. Varicocele:  None visualized. Pulsed Doppler interrogation demonstrates normal low resistance arterial and venous waveforms in the visualized left testis. Other: There is a large scrotal hernia likely arising from the right and containing bowel with bowel shadowing and peristalsis visualized. See CT report. IMPRESSION: 1. Large scrotal hernia containing multiple loops of bowel. See prior CT report. 2. Right testis is not visualized due to bowel shadowing. 3. Normal appearance of the left testis.  Small left hydrocele. Electronically Signed   By: Elsie Gravely M.D.   On: 11/21/2023 17:34   ECHOCARDIOGRAM COMPLETE Result Date: 11/21/2023    ECHOCARDIOGRAM REPORT   Patient Name:   Greg [name] Date of Exam: 11/21/2023 Medical Rec #:  [id number]      Height:       68.0 in Accession #:    [id number]     Weight:       109.8 lb Date of Birth:  [birth year]      BSA:          1.584 m Patient Age:    72 years       BP:           114/80 mmHg Patient Gender: M              HR:           76  bpm. Exam Location:  Inpatient Procedure: 2D Echo, Cardiac Doppler and Color Doppler (Both Spectral and Color            Flow Doppler were utilized during procedure). Indications:    I50.40* Unspecified combined systolic (congestive) and diastolic                 (  congestive) heart failure  History:        Patient has prior history of Echocardiogram examinations, most                 recent 12/19/2021. CHF, Previous Myocardial Infarction,                 Signs/Symptoms:Dyspnea and Shortness of Breath; Risk                 Factors:Current Smoker, Dyslipidemia and Hypertension.  Sonographer:    Ellouise Mose RDCS Referring Phys: [id number] Greg [name]  Sonographer Comments: Technically difficult study due to poor echo windows, suboptimal parasternal window and suboptimal apical window. Image acquisition challenging due to respiratory motion. Patient moving throughout exam. Parasternal images extremely low. IMPRESSIONS  1. Left ventricular ejection fraction, by estimation, is 60 to 65%. The left ventricle has normal function. The left ventricle has no regional wall motion abnormalities. Left ventricular diastolic parameters are consistent with Grade I diastolic dysfunction (impaired relaxation).  2. Right ventricular systolic function is normal. The right ventricular size is normal. Tricuspid regurgitation signal is inadequate for assessing PA pressure. The estimated right ventricular systolic pressure is 36.1 mmHg.  3. The mitral valve is normal in structure. Trivial mitral valve regurgitation. No evidence of mitral stenosis.  4. The aortic valve is normal in structure. Aortic valve regurgitation is not visualized. No aortic stenosis is present.  5. Ascending aorta not well visualized.  6. The inferior vena cava is dilated in size with >50% respiratory variability, suggesting right atrial pressure of 8 mmHg.  7. Cannot exclude a small PFO. Comparison(s): Changes from prior study are noted. The left ventricular function has  improved. FINDINGS  Left Ventricle: Left ventricular ejection fraction, by estimation, is 60 to 65%. The left ventricle has normal function. The left ventricle has no regional wall motion abnormalities. The left ventricular internal cavity size was normal in size. There is  no left ventricular hypertrophy. Left ventricular diastolic parameters are consistent with Grade I diastolic dysfunction (impaired relaxation). Right Ventricle: The right ventricular size is normal. No increase in right ventricular wall thickness. Right ventricular systolic function is normal. Tricuspid regurgitation signal is inadequate for assessing PA pressure. The tricuspid regurgitant velocity is 2.65 m/s, and with an assumed right atrial pressure of 8 mmHg, the estimated right ventricular systolic pressure is 36.1 mmHg. Left Atrium: Left atrial size was normal in size. Right Atrium: Right atrial size was normal in size. Pericardium: There is no evidence of pericardial effusion. Mitral Valve: The mitral valve is normal in structure. Trivial mitral valve regurgitation. No evidence of mitral valve stenosis. Tricuspid Valve: The tricuspid valve is normal in structure. Tricuspid valve regurgitation is trivial. No evidence of tricuspid stenosis. Aortic Valve: The aortic valve is normal in structure. Aortic valve regurgitation is not visualized. No aortic stenosis is present. Pulmonic Valve: The pulmonic valve was not well visualized. Pulmonic valve regurgitation is not visualized. No evidence of pulmonic stenosis. Aorta: Ascending aorta not well visualized. The aortic root is normal in size and structure. Venous: The inferior vena cava is dilated in size with greater than 50% respiratory variability, suggesting right atrial pressure of 8 mmHg. IAS/Shunts: Cannot exclude a small PFO.  LEFT VENTRICLE PLAX 2D LVIDd:         3.70 cm     Diastology LVIDs:         2.40 cm     LV e' medial:    7.40 cm/s LV PW:  0.70 cm     LV E/e' medial:  11.1  LV IVS:        0.70 cm     LV e' lateral:   6.74 cm/s LVOT diam:     2.40 cm     LV E/e' lateral: 12.2 LV SV:         70 LV SV Index:   44 LVOT Area:     4.52 cm  LV Volumes (MOD) LV vol d, MOD A2C: 36.4 ml LV vol d, MOD A4C: 48.3 ml LV vol s, MOD A2C: 16.3 ml LV vol s, MOD A4C: 18.6 ml LV SV MOD A2C:     20.1 ml LV SV MOD A4C:     48.3 ml LV SV MOD BP:      24.5 ml RIGHT VENTRICLE             IVC RV S prime:     12.30 cm/s  IVC diam: 2.30 cm TAPSE (M-mode): 1.5 cm LEFT ATRIUM             Index        RIGHT ATRIUM          Index LA diam:        2.60 cm 1.64 cm/m   RA Area:     9.21 cm LA Vol (A2C):   6.7 ml  4.26 ml/m   RA Volume:   17.50 ml 11.05 ml/m LA Vol (A4C):   17.9 ml 11.30 ml/m LA Biplane Vol: 12.0 ml 7.58 ml/m  AORTIC VALVE LVOT Vmax:   89.70 cm/s LVOT Vmean:  56.350 cm/s LVOT VTI:    0.156 m  AORTA Ao Root diam: 3.30 cm MITRAL VALVE               TRICUSPID VALVE MV Area (PHT): 4.49 cm    TR Peak grad:   28.1 mmHg MV Decel Time: 169 msec    TR Vmax:        265.00 cm/s MV E velocity: 82.05 cm/s MV A velocity: 83.15 cm/s  SHUNTS MV E/A ratio:  0.99        Systemic VTI:  0.16 m                            Systemic Diam: 2.40 cm Shelda Bruckner MD Electronically signed by Shelda Bruckner MD Signature Date/Time: 11/21/2023/2:31:19 PM    Final    CT ABDOMEN PELVIS W CONTRAST Result Date: 11/20/2023 CLINICAL DATA:  Sepsis EXAM: CT ABDOMEN AND PELVIS WITH CONTRAST TECHNIQUE: Multidetector CT imaging of the abdomen and pelvis was performed using the standard protocol following bolus administration of intravenous contrast. RADIATION DOSE REDUCTION: This exam was performed according to the departmental dose-optimization program which includes automated exposure control, adjustment of the mA and/or kV according to patient size and/or use of iterative reconstruction technique. CONTRAST:  OMNIPAQUE  IOHEXOL  300 MG/ML  SOLN COMPARISON:  CT angio chest abdomen pelvis December 18, 2021 FINDINGS:  Lower chest: No acute abnormality. Hepatobiliary: No focal liver abnormality is seen. No gallstones, gallbladder wall thickening, or biliary dilatation. Pancreas: Unremarkable. No pancreatic ductal dilatation or surrounding inflammatory changes. Spleen: Normal in size without focal abnormality. Adrenals/Urinary Tract: Adrenal glands are unremarkable. Kidneys are normal, without renal calculi, focal lesion, or hydronephrosis. Bladder is unremarkable. Stomach/Bowel: Stomach is within normal limits. Appendix appears normal. See below for malposition of right hemicolon within the scrotal sac hernia. Large stool throughout the  rectum. Otherwise no evidence of bowel wall thickening, distention, or inflammatory changes. Vascular/Lymphatic: Atherosclerotic calcifications of aorta and branches with severe tortuosity. 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 No enlarged abdominal or pelvic lymph nodes. Bilateral prominent inguinal lymph nodes measuring up to 11 mm. Reproductive: Large right inguinal hernia scrotal sac containing colon (right hemicolon and small-bowel) and fat measuring approximately 16 x 12 cm. No bowel obstruction identified. Superficial collateral vessels are identified overlying the right scrotal sac without active bleeding. Small hydrocele.  Moderate prostatomegaly. Other: No abdominopelvic ascites. Musculoskeletal: No acute or significant osseous findings. Degenerative changes of the spine. 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  Recommend follow-up to ensure stability. Large right sided inguinal hernia extending to the scrotal sac containing bowel, grossly stable to prior. No bowel obstruction. Prominent collateral veins overlying the right scrotal sac. Electronically Signed   By: Megan  Zare M.D.   On: 11/20/2023 17:16    Microbiology: Results for orders placed or performed during the [name] encounter of 11/20/23  Resp panel by RT-PCR (RSV, Flu A&B, Covid) Anterior Nasal Swab     Status: None   Collection Time: 11/20/23  7:23 PM   Specimen: Anterior Nasal Swab  Result Value Ref Range Status   SARS Coronavirus 2 by RT PCR NEGATIVE NEGATIVE Final    Comment: (NOTE) SARS-CoV-2 target nucleic acids are NOT DETECTED.  The SARS-CoV-2 RNA is generally detectable in upper respiratory specimens during the acute phase of infection. The lowest concentration of SARS-CoV-2 viral copies this assay can detect is 138 copies/mL. A negative result does not preclude SARS-Cov-2 infection and should not be used as the sole basis for treatment or other patient management decisions. A negative result may occur with  improper specimen collection/handling, submission of specimen other than nasopharyngeal swab, presence of viral mutation(s) within the areas targeted by this assay, and inadequate number of viral copies(<138 copies/mL). A negative result must be combined with clinical observations, patient history, and epidemiological information. The expected result is Negative.  Fact Sheet for Patients:  BloggerCourse.com  Fact Sheet for Healthcare Providers:  SeriousBroker.it  This test is no t yet approved or cleared by the [place]  FDA and  has been authorized for detection and/or diagnosis of SARS-CoV-2 by FDA under an Emergency Use Authorization (EUA). This EUA will remain  in effect (meaning this test can be used) for the duration of the COVID-19 declaration under  Section 564(b)(1) of the Act, 21 U.S.C.section 360bbb-3(b)(1), unless the authorization is terminated  or revoked sooner.       Influenza A by PCR NEGATIVE NEGATIVE Final   Influenza B by PCR NEGATIVE NEGATIVE Final    Comment: (NOTE) The Xpert Xpress SARS-CoV-2/FLU/RSV plus assay is intended as an aid in the diagnosis of influenza from Nasopharyngeal swab specimens and should not be used as a sole basis for treatment. Nasal washings and aspirates are unacceptable for Xpert Xpress SARS-CoV-2/FLU/RSV testing.  Fact Sheet for Patients: BloggerCourse.com  Fact Sheet for Healthcare Providers: SeriousBroker.it  This test is not yet approved or cleared by the [place]  FDA and has  been authorized for detection and/or diagnosis of SARS-CoV-2 by FDA under an Emergency Use Authorization (EUA). This EUA will remain in effect (meaning this test can be used) for the duration of the COVID-19 declaration under Section 564(b)(1) of the Act, 21 U.S.C. section 360bbb-3(b)(1), unless the authorization is terminated or revoked.     Resp Syncytial Virus by PCR NEGATIVE NEGATIVE Final    Comment: (NOTE) Fact Sheet for Patients: BloggerCourse.com  Fact Sheet for Healthcare Providers: SeriousBroker.it  This test is not yet approved or cleared by the [place]  FDA and has been authorized for detection and/or diagnosis of SARS-CoV-2 by FDA under an Emergency Use Authorization (EUA). This EUA will remain in effect (meaning this test can be used) for the duration of the COVID-19 declaration under Section 564(b)(1) of the Act, 21 U.S.C. section 360bbb-3(b)(1), unless the authorization is terminated or revoked.  Performed at [facility], [address] [place], [place] [address]     Labs: CBC: Recent Labs  Lab 11/20/23 1404 11/21/23 0057 11/21/23 9166  11/21/23 2012 11/22/23 0518 11/23/23 0739 11/25/23 0517  WBC 4.6 3.4*  --   --  3.7*  --  3.9*  NEUTROABS 3.4  --   --   --   --   --   --   HGB 10.0* 8.8* 8.8* 8.4* 8.6* 8.4* 9.8*  HCT 33.2* 29.4* 29.9* 27.7* 29.9* 30.0* 33.1*  MCV 83.8 84.0  --   --  85.4  --  86.0  PLT 195 190  --   --  208  --  328   Basic Metabolic Panel: Recent Labs  Lab 11/20/23 1404 11/21/23 0057 11/22/23 0518 11/25/23 0517  NA 133* 133* 134* 134*  K 4.5 3.9 3.7 4.0  CL 97* 98 101 97*  CO2 24 24 23 28   GLUCOSE 97 86 95 83  BUN 11 8 7* 7*  CREATININE 0.72 0.66 0.73 0.78  CALCIUM  9.8 9.1 8.8* 9.5  MG  --   --  2.3  --   PHOS  --   --  2.5  --    Liver Function Tests: Recent Labs  Lab 11/20/23 1404  AST 22  ALT 15  ALKPHOS 80  BILITOT 0.5  PROT 7.8  ALBUMIN 4.2   CBG: No results for input(s): GLUCAP in the last 168 hours.  Discharge time spent: greater than 30 minutes.  Signed: Owen DELENA Lore, MD [facility] 11/26/2023

## 2023-12-04 ENCOUNTER — Other Ambulatory Visit: Payer: Self-pay | Admitting: *Deleted

## 2023-12-04 DIAGNOSIS — I714 Abdominal aortic aneurysm, without rupture, unspecified: Secondary | ICD-10-CM

## 2023-12-05 ENCOUNTER — Encounter: Payer: Self-pay | Admitting: Podiatry

## 2023-12-05 ENCOUNTER — Ambulatory Visit (INDEPENDENT_AMBULATORY_CARE_PROVIDER_SITE_OTHER): Admitting: Podiatry

## 2023-12-05 DIAGNOSIS — D696 Thrombocytopenia, unspecified: Secondary | ICD-10-CM

## 2023-12-05 DIAGNOSIS — M79675 Pain in left toe(s): Secondary | ICD-10-CM | POA: Diagnosis not present

## 2023-12-05 DIAGNOSIS — M79674 Pain in right toe(s): Secondary | ICD-10-CM

## 2023-12-05 DIAGNOSIS — B351 Tinea unguium: Secondary | ICD-10-CM

## 2023-12-05 NOTE — Progress Notes (Signed)
 This patient returns to my office for at risk foot care.  This patient requires this care by a professional since this patient will be at risk due to having thrombocytopenia. This patient is unable to cut nails himself since the patient cannot reach his nails.These nails are painful walking and wearing shoes.  He has not been seen in over three years. This patient presents for at risk foot care today.  General Appearance  Alert, conversant and in no acute stress.  Vascular  Dorsalis pedis and posterior tibial  pulses are palpable  bilaterally.  Capillary return is within normal limits  bilaterally. Temperature is within normal limits  bilaterally.  Neurologic  Senn-Weinstein monofilament wire test within normal limits  bilaterally. Muscle power within normal limits bilaterally.  Nails Thick disfigured discolored nails with subungual debris  from hallux to fifth toes bilaterally. No evidence of bacterial infection or drainage bilaterally.  Orthopedic  No limitations of motion  feet .  No crepitus or effusions noted.  No bony pathology or digital deformities noted.  Skin  normotropic skin with no porokeratosis noted bilaterally.  No signs of infections or ulcers noted.     Onychomycosis  Pain in right toes  Pain in left toes  Consent was obtained for treatment procedures.   Mechanical debridement of nails 1-5  bilaterally performed with a nail nipper.  Filed with dremel without incident.    Return office visit      4 months                Told patient to return for periodic foot care and evaluation due to potential at risk complications.   Cordella Bold DPM

## 2023-12-12 ENCOUNTER — Ambulatory Visit (HOSPITAL_COMMUNITY)
Admission: RE | Admit: 2023-12-12 | Discharge: 2023-12-12 | Disposition: A | Discharge status: Home / Self Care | Source: Ambulatory Visit | Attending: Vascular Surgery | Admitting: Vascular Surgery

## 2023-12-12 ENCOUNTER — Encounter (HOSPITAL_COMMUNITY): Payer: Self-pay | Admitting: Internal Medicine

## 2023-12-12 DIAGNOSIS — I714 Abdominal aortic aneurysm, without rupture, unspecified: Secondary | ICD-10-CM | POA: Diagnosis present

## 2023-12-17 NOTE — Progress Notes (Unsigned)
 Office Note     CC: 3 cm right iliac artery aneurysm, 4 cm AAA Requesting Provider:  No ref. provider found  HPI: Greg [name] is a 73 y.o. 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  The pt is *** on a statin for cholesterol management.  The pt is *** on a daily aspirin .   Other AC:  *** The pt is *** on medication for hypertension.   The pt is *** diabetic.  Tobacco hx:  ***  Past Medical History:  Diagnosis Date   Barrett's esophagus    Gastric ulcer     Past Surgical History:  Procedure Laterality Date   BIOPSY  12/20/2021   Procedure: BIOPSY;  Surgeon: Eda Iha, MD;  Location: [facility] ENDOSCOPY;  Service: Gastroenterology;;   COLONOSCOPY WITH PROPOFOL  N/A 01/22/2022   Procedure: COLONOSCOPY WITH PROPOFOL ;  Surgeon: Leigh Elspeth SQUIBB, MD;  Location: [facility] ENDOSCOPY;  Service: Gastroenterology;  Laterality: N/A;   ESOPHAGOGASTRODUODENOSCOPY (EGD) WITH PROPOFOL  N/A 12/20/2021   Procedure: ESOPHAGOGASTRODUODENOSCOPY (EGD) WITH PROPOFOL ;  Surgeon: Eda Iha, MD;  Location: [facility] ENDOSCOPY;  Service: Gastroenterology;  Laterality: N/A;   ESOPHAGOGASTRODUODENOSCOPY (EGD) WITH PROPOFOL  N/A 01/19/2022   Procedure: ESOPHAGOGASTRODUODENOSCOPY (EGD) WITH PROPOFOL ;  Surgeon: Shila Gustav GAILS, MD;  Location: [facility] ENDOSCOPY;  Service: Gastroenterology;  Laterality: N/A;   GIVENS CAPSULE STUDY N/A 01/22/2022   Procedure: GIVENS CAPSULE STUDY;  Surgeon: Leigh Elspeth SQUIBB, MD;  Location: [facility] ENDOSCOPY;  Service: Gastroenterology;  Laterality: N/A;   HOT HEMOSTASIS N/A 01/19/2022   Procedure: HOT HEMOSTASIS (ARGON PLASMA COAGULATION/BICAP);  Surgeon: Nandigam, Kavitha V, MD;  Location: [facility] ENDOSCOPY;  Service: Gastroenterology;  Laterality: N/A;   POLYPECTOMY  01/22/2022   Procedure: POLYPECTOMY;  Surgeon: Leigh Elspeth SQUIBB, MD;  Location: [facility] ENDOSCOPY;  Service: Gastroenterology;;    Social History    Socioeconomic History   Marital status: Legally Separated    Spouse name: Not on file   Number of children: Not on file   Years of education: Not on file   Highest education level: Not on file  Occupational History   Occupation: retired  Tobacco Use   Smoking status: Every Day    Current packs/day: 1.00    Types: Cigarettes   Smokeless tobacco: Never  Vaping Use   Vaping status: Never Used  Substance and Sexual Activity   Alcohol use: Not Currently    Comment: former   Drug use: Yes    Types: Marijuana    Comment: partially   Sexual activity: Not on file  Other Topics Concern   Not on file  Social History Narrative   Not on file   Social Drivers of Health   Financial Resource Strain: Low Risk  (02/12/2022)   Received from [facility]   Overall Financial Resource Strain (CARDIA)    Difficulty of Paying Living Expenses: Not very hard  Food Insecurity: No Food Insecurity (11/20/2023)   Hunger Vital Sign    Worried About Running Out of Food in the Last Year: Never true    Ran Out of Food in the Last Year: Never true  Transportation Needs: No Transportation Needs (11/20/2023)   PRAPARE - Administrator, Civil Service (Medical): No    Lack of Transportation (Non-Medical): No  Physical Activity: Inactive (11/06/2021)   Received from [facility]   Exercise Vital Sign    On average, how many days per week do you engage in moderate to  strenuous exercise (like a brisk walk)?: 0 days    On average, how many minutes do you engage in exercise at this level?: 0 min  Stress: No Stress Concern Present (11/06/2021)   Received from [facility] - Occupational Stress Questionnaire    Feeling of Stress : Not at all  Social Connections: Moderately Isolated (11/21/2023)   Social Connection and Isolation Panel    Frequency of Communication with Friends and Family: Three times a week    Frequency of Social Gatherings with Friends  and Family: Three times a week    Attends Religious Services: 1 to 4 times per year    Active Member of Clubs or Organizations: No    Attends Banker Meetings: Never    Marital Status: Separated  Intimate Partner Violence: Not At Risk (11/20/2023)   Humiliation, Afraid, Rape, and Kick questionnaire    Fear of Current or Ex-Partner: No    Emotionally Abused: No    Physically Abused: No    Sexually Abused: No   *** Family History  Problem Relation Age of Onset   Stomach cancer Neg Hx    Colon cancer Neg Hx    Esophageal cancer Neg Hx    Pancreatic cancer Neg Hx     Current Outpatient Medications  Medication Sig Dispense Refill   acetaminophen  (TYLENOL ) 325 MG tablet Take 2 tablets (650 mg total) by mouth every 6 (six) hours as needed for mild pain (or Fever >/= 101).     albuterol  (VENTOLIN  HFA) 108 (90 Base) MCG/ACT inhaler Inhale 2 puffs into the lungs every 4 (four) hours as needed for wheezing or shortness of breath. 6.7 g 0   ferrous sulfate 325 (65 FE) MG tablet Take 1 tablet (325 mg total) by mouth daily with breakfast. 30 tablet 3   fluticasone -salmeterol (ADVAIR  DISKUS) 250-50 MCG/ACT AEPB Inhale 1 puff into the lungs in the morning and at bedtime. 60 each 0   oxyCODONE (OXY IR/ROXICODONE) 5 MG immediate release tablet Take 1 tablet (5 mg total) by mouth every 6 (six) hours as needed for moderate pain (pain score 4-6). 30 tablet 0   pantoprazole  (PROTONIX ) 40 MG tablet Take 1 tablet (40 mg total) by mouth 2 (two) times daily. 60 tablet 1   No current facility-administered medications for this visit.    No Known Allergies   REVIEW OF SYSTEMS:  *** [X]  denotes positive finding, [ ]  denotes negative finding Cardiac  Comments:  Chest pain or chest pressure:    Shortness of breath upon exertion:    Short of breath when lying flat:    Irregular heart rhythm:        Vascular    Pain in calf, thigh, or hip brought on by ambulation:    Pain in feet at night  that wakes you up from your sleep:     Blood clot in your veins:    Leg swelling:         Pulmonary    Oxygen at home:    Productive cough:     Wheezing:         Neurologic    Sudden weakness in arms or legs:     Sudden numbness in arms or legs:     Sudden onset of difficulty speaking or slurred speech:    Temporary loss of vision in one eye:     Problems with dizziness:         Gastrointestinal  Blood in stool:     Vomited blood:         Genitourinary    Burning when urinating:     Blood in urine:        Psychiatric    Major depression:         Hematologic    Bleeding problems:    Problems with blood clotting too easily:        Skin    Rashes or ulcers:        Constitutional    Fever or chills:      PHYSICAL EXAMINATION:  There were no vitals filed for this visit.  General:  WDWN in NAD; vital signs documented above Gait: Not observed HENT: WNL, normocephalic Pulmonary: normal non-labored breathing , without wheezing Cardiac: {Desc; regular/irreg:14544} HR Abdomen: soft, NT, no masses Skin: {With/Without:20273} rashes Vascular Exam/Pulses:  Right Left  Radial {Exam; arterial pulse strength 0-4:30167} {Exam; arterial pulse strength 0-4:30167}  Ulnar {Exam; arterial pulse strength 0-4:30167} {Exam; arterial pulse strength 0-4:30167}  Femoral {Exam; arterial pulse strength 0-4:30167} {Exam; arterial pulse strength 0-4:30167}  Popliteal {Exam; arterial pulse strength 0-4:30167} {Exam; arterial pulse strength 0-4:30167}  DP {Exam; arterial pulse strength 0-4:30167} {Exam; arterial pulse strength 0-4:30167}  PT {Exam; arterial pulse strength 0-4:30167} {Exam; arterial pulse strength 0-4:30167}   Extremities: {With/Without:20273} ischemic changes, {With/Without:20273} Gangrene , {With/Without:20273} cellulitis; {With/Without:20273} open wounds;  Musculoskeletal: no muscle wasting or atrophy  Neurologic: A&O X 3;  No focal weakness or paresthesias are  detected Psychiatric:  The pt has {Desc; normal/abnormal:11317::Normal} affect.   Non-Invasive Vascular Imaging:    Vascular/Lymphatic: Atherosclerotic calcifications of aorta and branches with severe tortuosity. 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    ASSESSMENT/PLAN: AIJALON KIRTZ is a 73 y.o. male presenting with incidentally found AAA, right common iliac artery aneurysm.  I independently reviewed imaging from 10/22/2023 as well as 12/18/2021. 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  No significant change over the last 2 years, some growth in the AAA from 3 to 4 cm.  I had a nice discussion with Eustacio regarding the above, most notably that we need to continue to follow his lesions.  Should the aneurysm continue to grow with no changes to his aortic neck, I think he will be a candidate for endovascular aortic repair.  Repair will not be straightforward due to disease in the external iliac arteries as well as likely need for colonization of the right hypogastric artery.    My plan is to follow-up in 46-month intervals at this time.  We discussed the signs and symptoms of rupture.  I asked him to call [phone] immediately should any of these occur.   Fonda FORBES Rim, MD [facility] [phone]

## 2023-12-18 ENCOUNTER — Encounter: Payer: Self-pay | Admitting: Vascular Surgery

## 2023-12-18 ENCOUNTER — Ambulatory Visit: Attending: Vascular Surgery | Admitting: Vascular Surgery

## 2023-12-18 VITALS — BP 120/74 | HR 90 | Temp 98.1°F | Resp 20 | Ht 68.0 in | Wt 109.0 lb

## 2023-12-18 DIAGNOSIS — I714 Abdominal aortic aneurysm, without rupture, unspecified: Secondary | ICD-10-CM | POA: Diagnosis present

## 2024-03-12 ENCOUNTER — Ambulatory Visit: Admitting: Podiatry
